# Patient Record
Sex: Female | Born: 1955 | Race: Black or African American | Hispanic: No | Marital: Married | Smoking: Former smoker
Health system: Southern US, Community
[De-identification: ages and names within clinical notes are randomized; demographics above are authoritative.]

## PROBLEM LIST (undated history)

## (undated) DIAGNOSIS — M2550 Pain in unspecified joint: Secondary | ICD-10-CM

## (undated) DIAGNOSIS — Z9889 Other specified postprocedural states: Secondary | ICD-10-CM

## (undated) DIAGNOSIS — R7303 Prediabetes: Secondary | ICD-10-CM

## (undated) DIAGNOSIS — R110 Nausea: Secondary | ICD-10-CM

## (undated) DIAGNOSIS — E785 Hyperlipidemia, unspecified: Secondary | ICD-10-CM

## (undated) DIAGNOSIS — I10 Essential (primary) hypertension: Secondary | ICD-10-CM

## (undated) DIAGNOSIS — E538 Deficiency of other specified B group vitamins: Secondary | ICD-10-CM

## (undated) DIAGNOSIS — K5900 Constipation, unspecified: Secondary | ICD-10-CM

## (undated) DIAGNOSIS — C801 Malignant (primary) neoplasm, unspecified: Secondary | ICD-10-CM

## (undated) DIAGNOSIS — R011 Cardiac murmur, unspecified: Secondary | ICD-10-CM

## (undated) DIAGNOSIS — F419 Anxiety disorder, unspecified: Secondary | ICD-10-CM

## (undated) DIAGNOSIS — E559 Vitamin D deficiency, unspecified: Secondary | ICD-10-CM

## (undated) DIAGNOSIS — R609 Edema, unspecified: Secondary | ICD-10-CM

## (undated) HISTORY — DX: Edema, unspecified: R60.9

## (undated) HISTORY — DX: Hyperlipidemia, unspecified: E78.5

## (undated) HISTORY — DX: Nausea: R11.0

## (undated) HISTORY — PX: POLYPECTOMY: SHX149

## (undated) HISTORY — DX: Vitamin D deficiency, unspecified: E55.9

## (undated) HISTORY — DX: Pain in unspecified joint: M25.50

## (undated) HISTORY — DX: Constipation, unspecified: K59.00

## (undated) HISTORY — DX: Cardiac murmur, unspecified: R01.1

## (undated) HISTORY — PX: APPENDECTOMY: SHX54

## (undated) HISTORY — DX: Prediabetes: R73.03

## (undated) HISTORY — DX: Deficiency of other specified B group vitamins: E53.8

## (undated) HISTORY — DX: Anxiety disorder, unspecified: F41.9

## (undated) HISTORY — DX: Other specified postprocedural states: Z98.890

## (undated) HISTORY — PX: ABDOMINAL HYSTERECTOMY: SHX81

## (undated) HISTORY — DX: Essential (primary) hypertension: I10

---

## 2005-11-26 ENCOUNTER — Ambulatory Visit: Payer: Self-pay | Admitting: Family Medicine

## 2005-11-26 ENCOUNTER — Encounter: Payer: Self-pay | Admitting: Family Medicine

## 2005-11-26 ENCOUNTER — Other Ambulatory Visit: Admission: RE | Admit: 2005-11-26 | Discharge: 2005-11-26 | Payer: Self-pay | Admitting: Family Medicine

## 2005-11-26 LAB — CONVERTED CEMR LAB
ALT: 22 units/L (ref 0–40)
AST: 20 units/L (ref 0–37)
Albumin: 3.7 g/dL (ref 3.5–5.2)
Alkaline Phosphatase: 79 units/L (ref 39–117)
BUN: 8 mg/dL (ref 6–23)
Basophils Absolute: 0 10*3/uL (ref 0.0–0.1)
Chloride: 102 meq/L (ref 96–112)
Cholesterol: 153 mg/dL (ref 0–200)
Creatinine, Ser: 0.8 mg/dL (ref 0.5–1.7)
Eosinophils Absolute: 0.1 10*3/uL (ref 0.0–0.6)
Eosinophils Relative: 1.7 % (ref 0.0–5.0)
Glucose, Bld: 88 mg/dL (ref 70–99)
HCT: 38.3 % (ref 36.0–46.0)
HDL: 35.4 mg/dL — ABNORMAL LOW (ref >39.0)
LDL Cholesterol: 105 mg/dL — ABNORMAL HIGH (ref 0–99)
MCHC: 33.8 g/dL (ref 30.0–36.0)
Neutro Abs: 2.8 10*3/uL (ref 1.4–7.7)
Potassium: 4.5 meq/L (ref 3.5–5.5)
RBC: 4.4 M/uL (ref 3.87–5.11)
Sodium: 137 meq/L (ref 135–145)
TSH: 1.05 microintl units/mL (ref 0.35–5.50)
Total Bilirubin: 0.5 mg/dL (ref 0.3–1.2)
Total CHOL/HDL Ratio: 4.3
Total Protein: 7.7 g/dL (ref 6.0–8.3)

## 2005-12-11 ENCOUNTER — Encounter: Admission: RE | Admit: 2005-12-11 | Discharge: 2005-12-11 | Payer: Self-pay | Admitting: Family Medicine

## 2005-12-17 ENCOUNTER — Ambulatory Visit: Payer: Self-pay | Admitting: Family Medicine

## 2006-01-02 ENCOUNTER — Encounter: Admission: RE | Admit: 2006-01-02 | Discharge: 2006-01-02 | Payer: Self-pay | Admitting: Family Medicine

## 2007-10-12 ENCOUNTER — Encounter: Admission: RE | Admit: 2007-10-12 | Discharge: 2007-10-12 | Payer: Self-pay | Admitting: Family Medicine

## 2007-10-25 ENCOUNTER — Encounter (INDEPENDENT_AMBULATORY_CARE_PROVIDER_SITE_OTHER): Payer: Self-pay | Admitting: *Deleted

## 2008-01-04 ENCOUNTER — Telehealth (INDEPENDENT_AMBULATORY_CARE_PROVIDER_SITE_OTHER): Payer: Self-pay | Admitting: *Deleted

## 2008-01-04 ENCOUNTER — Ambulatory Visit: Payer: Self-pay | Admitting: Family Medicine

## 2008-01-04 DIAGNOSIS — R011 Cardiac murmur, unspecified: Secondary | ICD-10-CM

## 2008-01-04 DIAGNOSIS — F411 Generalized anxiety disorder: Secondary | ICD-10-CM | POA: Insufficient documentation

## 2008-01-04 DIAGNOSIS — I10 Essential (primary) hypertension: Secondary | ICD-10-CM

## 2008-01-06 ENCOUNTER — Ambulatory Visit: Payer: Self-pay | Admitting: Family Medicine

## 2008-01-07 ENCOUNTER — Encounter (INDEPENDENT_AMBULATORY_CARE_PROVIDER_SITE_OTHER): Payer: Self-pay | Admitting: *Deleted

## 2008-01-14 ENCOUNTER — Ambulatory Visit: Payer: Self-pay | Admitting: Family Medicine

## 2008-01-17 ENCOUNTER — Encounter: Payer: Self-pay | Admitting: Family Medicine

## 2008-01-17 ENCOUNTER — Ambulatory Visit: Payer: Self-pay

## 2008-01-17 ENCOUNTER — Telehealth (INDEPENDENT_AMBULATORY_CARE_PROVIDER_SITE_OTHER): Payer: Self-pay | Admitting: *Deleted

## 2008-01-17 LAB — CONVERTED CEMR LAB
AST: 21 units/L (ref 0–37)
Alkaline Phosphatase: 71 units/L (ref 39–117)
Basophils Absolute: 0 10*3/uL (ref 0.0–0.1)
Basophils Relative: 0.8 % (ref 0.0–1.0)
Calcium: 9.5 mg/dL (ref 8.4–10.5)
Chloride: 101 meq/L (ref 96–112)
GFR calc non Af Amer: 94 mL/min
Glucose, Bld: 82 mg/dL (ref 70–99)
HDL: 36.6 mg/dL — ABNORMAL LOW (ref >39.0)
Hemoglobin: 13.5 g/dL (ref 12.0–15.0)
MCV: 90 fL (ref 78.0–100.0)
Neutro Abs: 2.8 10*3/uL (ref 1.4–7.7)
Potassium: 4.5 meq/L (ref 3.5–5.1)
RBC: 4.54 M/uL (ref 3.87–5.11)
RDW: 13.1 % (ref 11.5–14.6)
Sodium: 136 meq/L (ref 135–145)
TSH: 0.99 microintl units/mL (ref 0.35–5.50)
Total Protein: 8 g/dL (ref 6.0–8.3)
VLDL: 12 mg/dL (ref 0–40)
WBC: 4.3 10*3/uL — ABNORMAL LOW (ref 4.5–10.5)

## 2008-01-19 ENCOUNTER — Telehealth (INDEPENDENT_AMBULATORY_CARE_PROVIDER_SITE_OTHER): Payer: Self-pay | Admitting: *Deleted

## 2008-01-24 ENCOUNTER — Telehealth (INDEPENDENT_AMBULATORY_CARE_PROVIDER_SITE_OTHER): Payer: Self-pay | Admitting: *Deleted

## 2008-02-02 ENCOUNTER — Ambulatory Visit: Payer: Self-pay | Admitting: Family Medicine

## 2008-02-21 ENCOUNTER — Ambulatory Visit: Payer: Self-pay | Admitting: Family Medicine

## 2008-02-21 LAB — CONVERTED CEMR LAB
Calcium: 9.8 mg/dL (ref 8.4–10.5)
GFR calc Af Amer: 85 mL/min
Potassium: 4.2 meq/L (ref 3.5–5.1)

## 2008-02-22 ENCOUNTER — Encounter (INDEPENDENT_AMBULATORY_CARE_PROVIDER_SITE_OTHER): Payer: Self-pay | Admitting: *Deleted

## 2008-08-31 ENCOUNTER — Telehealth (INDEPENDENT_AMBULATORY_CARE_PROVIDER_SITE_OTHER): Payer: Self-pay | Admitting: *Deleted

## 2009-03-20 ENCOUNTER — Encounter (INDEPENDENT_AMBULATORY_CARE_PROVIDER_SITE_OTHER): Payer: Self-pay | Admitting: *Deleted

## 2009-04-16 ENCOUNTER — Ambulatory Visit: Payer: Self-pay | Admitting: Family Medicine

## 2009-04-16 DIAGNOSIS — J301 Allergic rhinitis due to pollen: Secondary | ICD-10-CM

## 2009-04-16 DIAGNOSIS — J302 Other seasonal allergic rhinitis: Secondary | ICD-10-CM | POA: Insufficient documentation

## 2009-04-16 LAB — CONVERTED CEMR LAB
ALT: 17 units/L (ref 0–35)
AST: 20 units/L (ref 0–37)
Alkaline Phosphatase: 65 units/L (ref 39–117)
BUN: 8 mg/dL (ref 6–23)
Bilirubin, Direct: 0 mg/dL (ref 0.0–0.3)
CO2: 27 meq/L (ref 19–32)
Calcium: 8.9 mg/dL (ref 8.4–10.5)
Chloride: 103 meq/L (ref 96–112)
Creatinine, Ser: 0.6 mg/dL (ref 0.4–1.2)
GFR calc non Af Amer: 134.84 mL/min (ref >60)
Glucose, Bld: 80 mg/dL (ref 70–99)
HDL: 33.8 mg/dL — ABNORMAL LOW (ref >39.00)
LDL Cholesterol: 99 mg/dL (ref 0–99)
Sodium: 134 meq/L — ABNORMAL LOW (ref 135–145)
Total Bilirubin: 0.9 mg/dL (ref 0.3–1.2)
Total CHOL/HDL Ratio: 4
Total Protein: 7.8 g/dL (ref 6.0–8.3)

## 2009-04-17 ENCOUNTER — Encounter (INDEPENDENT_AMBULATORY_CARE_PROVIDER_SITE_OTHER): Payer: Self-pay | Admitting: *Deleted

## 2009-05-28 ENCOUNTER — Telehealth (INDEPENDENT_AMBULATORY_CARE_PROVIDER_SITE_OTHER): Payer: Self-pay | Admitting: *Deleted

## 2010-05-24 ENCOUNTER — Telehealth (INDEPENDENT_AMBULATORY_CARE_PROVIDER_SITE_OTHER): Payer: Self-pay | Admitting: *Deleted

## 2010-06-17 ENCOUNTER — Encounter (INDEPENDENT_AMBULATORY_CARE_PROVIDER_SITE_OTHER): Payer: Self-pay | Admitting: *Deleted

## 2010-07-26 ENCOUNTER — Ambulatory Visit: Payer: Self-pay | Admitting: Family Medicine

## 2010-07-26 DIAGNOSIS — E785 Hyperlipidemia, unspecified: Secondary | ICD-10-CM | POA: Insufficient documentation

## 2010-08-26 ENCOUNTER — Ambulatory Visit: Payer: Self-pay | Admitting: Family Medicine

## 2010-08-26 DIAGNOSIS — J018 Other acute sinusitis: Secondary | ICD-10-CM | POA: Insufficient documentation

## 2010-11-08 ENCOUNTER — Ambulatory Visit
Admission: RE | Admit: 2010-11-08 | Discharge: 2010-11-08 | Payer: Self-pay | Source: Home / Self Care | Attending: Family Medicine | Admitting: Family Medicine

## 2010-11-13 ENCOUNTER — Ambulatory Visit
Admission: RE | Admit: 2010-11-13 | Discharge: 2010-11-13 | Payer: Self-pay | Source: Home / Self Care | Attending: Family Medicine | Admitting: Family Medicine

## 2010-11-13 ENCOUNTER — Other Ambulatory Visit: Payer: Self-pay | Admitting: Family Medicine

## 2010-11-13 LAB — BASIC METABOLIC PANEL
BUN: 12 mg/dL (ref 6–23)
CO2: 29 mEq/L (ref 19–32)
Calcium: 10.1 mg/dL (ref 8.4–10.5)
Chloride: 101 mEq/L (ref 96–112)
Creatinine, Ser: 0.7 mg/dL (ref 0.4–1.2)
GFR: 112.19 mL/min (ref >60.00)
Glucose, Bld: 87 mg/dL (ref 70–99)
Potassium: 5.1 mEq/L (ref 3.5–5.1)
Sodium: 139 mEq/L (ref 135–145)

## 2010-11-13 LAB — LIPID PANEL
Cholesterol: 218 mg/dL — ABNORMAL HIGH (ref 0–200)
HDL: 43 mg/dL (ref >39.00)
Total CHOL/HDL Ratio: 5
Triglycerides: 54 mg/dL (ref 0.0–149.0)
VLDL: 10.8 mg/dL (ref 0.0–40.0)

## 2010-11-13 LAB — HEPATIC FUNCTION PANEL
ALT: 27 U/L (ref 0–35)
AST: 26 U/L (ref 0–37)
Albumin: 4.3 g/dL (ref 3.5–5.2)
Alkaline Phosphatase: 78 U/L (ref 39–117)
Bilirubin, Direct: 0 mg/dL (ref 0.0–0.3)
Total Bilirubin: 0.7 mg/dL (ref 0.3–1.2)
Total Protein: 8.1 g/dL (ref 6.0–8.3)

## 2010-11-13 LAB — LDL CHOLESTEROL, DIRECT: Direct LDL: 173.9 mg/dL

## 2010-11-24 ENCOUNTER — Encounter: Payer: Self-pay | Admitting: Family Medicine

## 2010-12-03 NOTE — Letter (Signed)
Summary: Primary Care Appointment Letter  [facility] [place] [place]  [address] [place], [place] [address]   Phone: [phone]  Fax: [phone]    06/17/2010 MRN: [id number]  [facility] [address] [place], [place]  [address]  Dear Ms. Poythress,   Your Primary Care Physician Loreen Freud DO has indicated that:    __x_____it is time to schedule an appointment.    _______you missed your appointment on______ and need to call and          reschedule.    _______you need to have lab work done.    _______you need to schedule an appointment discuss lab or test results.    _______you need to call to reschedule your appointment that is                       scheduled on _________.     Please call our office as soon as possible. Our phone number is [phone]. Please press option 1. Our office is open 8a-5p, Monday through Friday.     Thank you,     Primary Care Scheduler

## 2010-12-03 NOTE — Assessment & Plan Note (Signed)
Summary: NEEDS MED REFILLS--LAST SEEN 04/2009///[facility]   Vital Signs:  Patient profile:   55 year old female Height:      66 inches Weight:      189.6 pounds BMI:     30.71 Temp:     98.7 degrees F oral Pulse rate:   72 / minute Pulse rhythm:   regular BP sitting:   140 / 68  (left arm)  Vitals Entered By: Almeta Monas CMA Duncan Dull) (July 26, 2010 1:54 PM) CC: F/U on meds   History of Present Illness:  Hypertension follow-up      This is a 55 year old woman who presents for Hypertension follow-up.  The patient denies lightheadedness, urinary frequency, headaches, edema, impotence, rash, and fatigue.  The patient denies the following associated symptoms: chest pain, chest pressure, exercise intolerance, dyspnea, palpitations, syncope, leg edema, and pedal edema.  Compliance with medications (by patient report) has been near 100%.  The patient reports that dietary compliance has been good.  The patient reports exercising occasionally.  Adjunctive measures currently used by the patient include salt restriction.    Hyperlipidemia follow-up      The patient also presents for Hyperlipidemia follow-up.  The patient denies muscle aches, GI upset, abdominal pain, flushing, itching, constipation, diarrhea, and fatigue.  The patient denies the following symptoms: chest pain/pressure, exercise intolerance, dypsnea, palpitations, syncope, and pedal edema.  Compliance with medications (by patient report) has been near 100%.  Dietary compliance has been good.  The patient reports exercising occasionally.  Adjunctive measures currently used by the patient include ASA.    Current Medications (verified): 1)  Abilify 10 Mg  Tabs (Aripiprazole) .... Take One Tablet 2)  Ativan 1 Mg  Tabs (Lorazepam) .... Take One Tablet Daily 3)  Norvasc 10 Mg  Tabs (Amlodipine Besylate) .[name].. 1 By Mouth Once Daily-Office Visit Due 4)  Hydrochlorothiazide 25 Mg  Tabs (Hydrochlorothiazide) .[name].. 1 By Mouth Once Daily-Office  Visit Due 5)  Zithromax Z-Pak 250 Mg Tabs (Azithromycin) .... 2 By Mouth Once Daily For 1 Day Then 1 By Mouth Once Daily For 4 More Days 6)  Allegra 180 Mg Tabs (Fexofenadine Hcl) .[name].. 1 By Mouth Once Daily As Needed 7)  Flonase 50 Mcg/act Susp (Fluticasone Propionate) .... 2 Sprays Each Nostril Once Daily 8)  Fish Oil 1000 Mg Caps (Omega-3 Fatty Acids) .... 2 By Mouth Two Times A Day 9)  Aspir-Low 81 Mg Tbec (Aspirin) .[name].. 1 By Mouth Once Daily 10)  Vita D  Allergies (verified): 1)  ! Pcn  Past History:  Past medical, surgical, family and social histories (including risk factors) reviewed for relevance to current acute and chronic problems.  Past Medical History: Reviewed history from 01/14/2008 and no changes required. Anxiety Hypertension  Past Surgical History: Reviewed history from 01/04/2008 and no changes required. Hysterectomy  Family History: Reviewed history from 01/04/2008 and no changes required. Family History of Prostate CA 1st degree relative <50 Family History of Arthritis  Social History: Reviewed history from 01/04/2008 and no changes required. Married Current Smoker Alcohol use-no Drug use-no Occupation:  [employer] Occupation:  employed  Review of Systems      See HPI  Physical Exam  General:  Well-developed,well-nourished,in no acute distress; alert,appropriate and cooperative throughout examination Lungs:  Normal respiratory effort, chest expands symmetrically. Lungs are clear to auscultation, no crackles or wheezes. Heart:  normal rate and no murmur.   Extremities:  No clubbing, cyanosis, edema, or deformity noted with normal full range  of motion of all joints.     Impression & Recommendations:  Problem # 1:  HYPERTENSION (ICD-401.9)  Her updated medication list for this problem includes:    Norvasc 10 Mg Tabs (Amlodipine besylate) .[name]... 1 by mouth once daily-office visit due    Hydrochlorothiazide 25 Mg Tabs (Hydrochlorothiazide) .[name]... 1  by mouth once daily-office visit due  BP today: 140/68 Prior BP: 140/90 (04/16/2009)  Labs Reviewed: K+: 4.0 (04/16/2009) Creat: : 0.6 (04/16/2009)   Chol: 151 (04/16/2009)   HDL: 33.80 (04/16/2009)   LDL: 99 (04/16/2009)   TG: 90.0 (04/16/2009)  Problem # 2:  HYPERLIPIDEMIA (ICD-272.4)  Labs Reviewed: SGOT: 20 (04/16/2009)   SGPT: 17 (04/16/2009)   HDL:33.80 (04/16/2009), 36.6 (01/06/2008)  LDL:99 (04/16/2009), 106 (16/08/9603)  Chol:151 (04/16/2009), 154 (01/06/2008)  Trig:90.0 (04/16/2009), 59 (01/06/2008)  Complete Medication List: 1)  Abilify 10 Mg Tabs (Aripiprazole) .... Take one tablet 2)  Ativan 1 Mg Tabs (Lorazepam) .... Take one tablet daily 3)  Norvasc 10 Mg Tabs (Amlodipine besylate) .[name].. 1 by mouth once daily-office visit due 4)  Hydrochlorothiazide 25 Mg Tabs (Hydrochlorothiazide) .[name].. 1 by mouth once daily-office visit due 5)  Zithromax Z-pak 250 Mg Tabs (Azithromycin) .... 2 by mouth once daily for 1 day then 1 by mouth once daily for 4 more days 6)  Allegra 180 Mg Tabs (Fexofenadine hcl) .[name].. 1 by mouth once daily as needed 7)  Flonase 50 Mcg/act Susp (Fluticasone propionate) .... 2 sprays each nostril once daily 8)  Fish Oil 1000 Mg Caps (Omega-3 fatty acids) .... 2 by mouth two times a day 9)  Aspir-low 81 Mg Tbec (Aspirin) .[name].. 1 by mouth once daily 10)  Vita D   Patient Instructions: 1)  272.4  401.9  lipid, hep, bmp-- fasting labs 2)  rto physical in spring Prescriptions: FLONASE 50 MCG/ACT SUSP (FLUTICASONE PROPIONATE) 2 sprays each nostril once daily  #3 x 3   Entered and Authorized by:   Loreen Freud DO   Signed by:   Loreen Freud DO on 07/26/2010   Method used:   Electronically to        [facility] (retail)       [address]       [place], [place]  [address]       Ph: [phone]       Fax: [phone]   RxID:   [id number] ALLEGRA 180 MG TABS (FEXOFENADINE HCL) 1 by mouth once daily as needed  #90 x 3    Entered and Authorized by:   Loreen Freud DO   Signed by:   Loreen Freud DO on 07/26/2010   Method used:   Electronically to        [facility] (retail)       [address]       [place], [place]  [address]       Ph: [phone]       Fax: [phone]   RxID:   [id number] HYDROCHLOROTHIAZIDE 25 MG  TABS (HYDROCHLOROTHIAZIDE) 1 by mouth once daily-OFFICE VISIT DUE  #90 x 3   Entered and Authorized by:   Loreen Freud DO   Signed by:   Loreen Freud DO on 07/26/2010   Method used:   Electronically to        [facility] (retail)       [address]  [place], [place]  [address]       Ph: [phone]       Fax: [phone]   RxID:   [id number] NORVASC 10 MG  TABS (AMLODIPINE BESYLATE) 1 by mouth once daily-OFFICE VISIT DUE  #90 x 3   Entered and Authorized by:   Loreen Freud DO   Signed by:   Loreen Freud DO on 07/26/2010   Method used:   Electronically to        [facility] (retail)       [address]       [place], [place]  [address]       Ph: [phone]       Fax: [phone]   RxID:   [id number]

## 2010-12-03 NOTE — Progress Notes (Signed)
Summary: CHEAPER MED  Phone Note Call from Patient Call back at Home Phone [phone]   Caller: Patient Summary of Call: PT STATES THAT INSURANCE WILL NOT COVE LOVAZA AND WOULD LIKE TO KNOW IF DR LOWNE COULD RX A CHEAPER MED. PLS ADVISE................Marland KitchenFelecia Deloach CMA  May 24, 2010 4:29 PM   Follow-up for Phone Call        she can try otc fish oil 1000mg  2 by mouth two times a day or flaxseed oil Follow-up by: Loreen Freud DO,  May 24, 2010 4:36 PM  Additional Follow-up for Phone Call Additional follow up Details #1::        tried to call pt no answer, will try again on monday...Marland KitchenMarland KitchenFelecia Deloach CMA  May 24, 2010 4:59 PM     Additional Follow-up for Phone Call Additional follow up Details #2::    pt aware................Marland KitchenFelecia Deloach CMA  May 24, 2010 5:03 PM

## 2010-12-03 NOTE — Letter (Signed)
Summary: Work Dietitian at [employer]  [address] [place], [place] [address]   Phone: [phone]  Fax: [phone]    Today's Date: July 26, 2010  Name of Patient: Alicia Knapp  The above named patient had a medical visit today at:  215p pm.  Please take this into consideration when reviewing the time away from work/school.    Special Instructions:  [ X ] None  [  ] To be off the remainder of today, returning to the normal work / school schedule tomorrow.  [  ] To be off until the next scheduled appointment on ______________________.  [  ] Other ________________________________________________________________ ________________________________________________________________________   Sincerely yours,   Loreen Freud DO

## 2010-12-03 NOTE — Assessment & Plan Note (Signed)
Summary: SINUS//PH   Vital Signs:  Patient profile:   55 year old female Weight:      189.4 pounds Temp:     98.1 degrees F oral Pulse rate:   72 / minute Pulse rhythm:   regular BP sitting:   130 / 70  (right arm) Cuff size:   regular  Vitals Entered By: Almeta Monas CMA Duncan Dull) (August 26, 2010 11:33 AM) CC: c/o ears clogged, lightheadedness, nasal congestion, sinus pressure and post nasal  drip, URI symptoms   History of Present Illness:       This is a 55 year old woman who presents with URI symptoms.  The symptoms began 4 days ago.  The patient complains of nasal congestion, purulent nasal discharge, dry cough, earache, and sick contacts.  The patient denies fever, low-grade fever (<100.5 degrees), fever of 100.5-103 degrees, fever of 103.1-104 degrees, fever to >104 degrees, stiff neck, dyspnea, wheezing, rash, vomiting, diarrhea, use of an antipyretic, and response to antipyretic.  The patient denies itchy watery eyes, itchy throat, sneezing, seasonal symptoms, response to antihistamine, headache, muscle aches, and severe fatigue.  The patient denies the following risk factors for Strep sinusitis: unilateral facial pain, unilateral nasal discharge, poor response to decongestant, double sickening, tooth pain, Strep exposure, tender adenopathy, and absence of cough.  + b/l sinus pressure  Current Medications (verified): 1)  Abilify 10 Mg  Tabs (Aripiprazole) .... Take One Tablet 2)  Ativan 1 Mg  Tabs (Lorazepam) .... Take One Tablet Daily 3)  Norvasc 10 Mg  Tabs (Amlodipine Besylate) .[name].. 1 By Mouth Once Daily-Office Visit Due 4)  Hydrochlorothiazide 25 Mg  Tabs (Hydrochlorothiazide) .[name].. 1 By Mouth Once Daily-Office Visit Due 5)  Zithromax Z-Pak 250 Mg Tabs (Azithromycin) .... 2 By Mouth Once Daily For 1 Day Then 1 By Mouth Once Daily For 4 More Days 6)  Allegra 180 Mg Tabs (Fexofenadine Hcl) .[name].. 1 By Mouth Once Daily As Needed 7)  Flonase 50 Mcg/act Susp (Fluticasone Propionate)  .... 2 Sprays Each Nostril Once Daily 8)  Fish Oil 1000 Mg Caps (Omega-3 Fatty Acids) .... 2 By Mouth Two Times A Day 9)  Aspir-Low 81 Mg Tbec (Aspirin) .[name].. 1 By Mouth Once Daily 10)  Vita D  Allergies (verified): 1)  ! Pcn 2)  ! Erythromycin  Physical Exam  General:  Well-developed,well-nourished,in no acute distress; alert,appropriate and cooperative throughout examination Ears:  + b/l cerumen impaction---- irrigated TM  + fluid  Nose:  L frontal sinus tenderness and R frontal sinus tenderness.   Mouth:  Oral mucosa and oropharynx without lesions or exudates.  Teeth in good repair. Neck:  No deformities, masses, or tenderness noted. Lungs:  Normal respiratory effort, chest expands symmetrically. Lungs are clear to auscultation, no crackles or wheezes. Psych:  Oriented X3 and good eye contact.     Impression & Recommendations:  Problem # 1:  OTHER ACUTE SINUSITIS (ICD-461.8)  The following medications were removed from the medication list:    Zithromax Z-pak 250 Mg Tabs (Azithromycin) .[name]... 2 by mouth once daily for 1 day then 1 by mouth once daily for 4 more days Her updated medication list for this problem includes:    Flonase 50 Mcg/act Susp (Fluticasone propionate) .[name]... 2 sprays each nostril once daily    Levaquin 500 Mg Tabs (Levofloxacin) .[name]... 1 by mouth once daily  Instructed on treatment. Call if symptoms persist or worsen.   Complete Medication List: 1)  Abilify 10 Mg Tabs (Aripiprazole) .Marland KitchenMarland KitchenMarland Kitchen  Take one tablet 2)  Ativan 1 Mg Tabs (Lorazepam) .... Take one tablet daily 3)  Norvasc 10 Mg Tabs (Amlodipine besylate) .[name].. 1 by mouth once daily-office visit due 4)  Hydrochlorothiazide 25 Mg Tabs (Hydrochlorothiazide) .[name].. 1 by mouth once daily-office visit due 5)  Allegra 180 Mg Tabs (Fexofenadine hcl) .[name].. 1 by mouth once daily as needed 6)  Flonase 50 Mcg/act Susp (Fluticasone propionate) .... 2 sprays each nostril once daily 7)  Fish Oil 1000 Mg Caps (Omega-3 fatty  acids) .... 2 by mouth two times a day 8)  Aspir-low 81 Mg Tbec (Aspirin) .[name].. 1 by mouth once daily 9)  Vita D  10)  Levaquin 500 Mg Tabs (Levofloxacin) .[name].. 1 by mouth once daily  Patient Instructions: 1)  Acute sinusitis symptoms for less than 10 days are not helped by antibiotics. Use warm moist compresses, and over the counter decongestants( only as directed). Call if no improvement in 5-7 days, sooner if increasing pain, fever, or new symptoms.  Prescriptions: LEVAQUIN 500 MG TABS (LEVOFLOXACIN) 1 by mouth once daily  #10 x 0   Entered and Authorized by:   Loreen Freud DO   Signed by:   Loreen Freud DO on 08/26/2010   Method used:   Electronically to        [facility] (retail)       [address]       [place], [place]  [address]       Ph: [phone]       Fax: [phone]   RxID:   [id number] BIAXIN XL PAC 500 MG XR24H-TAB (CLARITHROMYCIN) 2 by mouth once daily  #28 x 0   Entered and Authorized by:   Loreen Freud DO   Signed by:   Loreen Freud DO on 08/26/2010   Method used:   Electronically to        [facility] (retail)       [address]       [place], [place]  [address]       Ph: [phone]       Fax: [phone]   RxID:   [id number] ALLEGRA 180 MG TABS (FEXOFENADINE HCL) 1 by mouth once daily as needed  #90 x 3   Entered and Authorized by:   Loreen Freud DO   Signed by:   Loreen Freud DO on 08/26/2010   Method used:   Electronically to        [facility] (retail)       [address]       [place], [place]  [address]       Ph: [phone]       Fax: [phone]   RxID:   [id number] HYDROCHLOROTHIAZIDE 25 MG  TABS (HYDROCHLOROTHIAZIDE) 1 by mouth once daily-OFFICE VISIT DUE  #90 x 3   Entered and Authorized by:   Loreen Freud DO   Signed by:   Loreen Freud DO on 08/26/2010   Method used:   Electronically to        [facility] (retail)        [address]       [place], [place]  [address]       Ph: [phone]       Fax:  [phone]   RxID:   [id number]    Orders Added: 1)  Est. Patient Level III [[address]]

## 2010-12-03 NOTE — Letter (Signed)
Summary: Out of Work  [employer] [facility] [employer]  [address] [place], [place] [address]   Phone: [phone]  Fax: [phone]    August 26, 2010   Employee:  Alicia Knapp    To Whom It May Concern:   For Medical reasons, please excuse the above named employee from work for the following dates:  Start:   August 26, 2010  End:   August 27, 2010  If you need additional information, please feel free to contact our office.         Sincerely,    Loreen Freud DO

## 2010-12-05 NOTE — Assessment & Plan Note (Signed)
Summary: FOLLOWUP/KN   Vital Signs:  Patient profile:   55 year old female Weight:      194.4 pounds Temp:     98.0 degrees F oral BP sitting:   120 / 70  (right arm) Cuff size:   large  Vitals Entered By: Almeta Monas CMA Duncan Dull) (November 08, 2010 2:01 PM) CC: F/U ON MEDS-- Due for recheck 3 months   272.4  hep, lipid     History of Present Illness:  Hyperlipidemia follow-up      This is a 55 year old woman who presents for Hyperlipidemia follow-up.  The patient denies muscle aches, GI upset, abdominal pain, flushing, itching, constipation, diarrhea, and fatigue.  The patient denies the following symptoms: chest pain/pressure, exercise intolerance, dypsnea, palpitations, syncope, and pedal edema.  Compliance with medications (by patient report) has been near 100%.  Dietary compliance has been good.  The patient reports exercising occasionally.  Adjunctive measures currently used by the patient include ASA and weight reduction.    Hypertension follow-up      The patient also presents for Hypertension follow-up.  The patient denies lightheadedness, urinary frequency, headaches, edema, impotence, rash, and fatigue.  The patient denies the following associated symptoms: chest pain, chest pressure, exercise intolerance, dyspnea, palpitations, syncope, leg edema, and pedal edema.  Compliance with medications (by patient report) has been near 100%.  The patient reports that dietary compliance has been good.  The patient reports exercising occasionally.  Adjunctive measures currently used by the patient include salt restriction.    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 Take One Tablet 2)  Ativan 1 Mg  Tabs (Lorazepam) ....  Take One Tablet Daily 3)  Norvasc 10 Mg  Tabs (Amlodipine Besylate) .[name].. 1 By Mouth Once Daily-Office Visit Due 4)  Hydrochlorothiazide 25 Mg  Tabs (Hydrochlorothiazide) .[name].. 1 By Mouth Once Daily-Office Visit Due 5)  Allegra 180 Mg Tabs (Fexofenadine Hcl) .[name].. 1 By Mouth Once Daily As Needed 6)  Flonase 50 Mcg/act Susp (Fluticasone Propionate) .... 2 Sprays Each Nostril Once Daily 7)  Fish Oil 1000 Mg Caps (Omega-3 Fatty Acids) .... 2 By Mouth Two Times A Day 8)  Aspir-Low 81 Mg Tbec (Aspirin) .[name].. 1 By Mouth Once Daily 9)  Vita D  Current Medications (verified): 1)  Abilify 10 Mg  Tabs (Aripiprazole) .... Take One Tablet 2)  Ativan 1 Mg  Tabs (Lorazepam) .... Take One Tablet Daily 3)  Norvasc 10 Mg  Tabs (Amlodipine Besylate) .[name].. 1 By Mouth Once Daily-Office Visit Due 4)  Hydrochlorothiazide 25 Mg  Tabs (Hydrochlorothiazide) .[name].. 1 By Mouth Once Daily-Office Visit Due 5)  Allegra 180 Mg Tabs (Fexofenadine Hcl) .[name].. 1 By Mouth Once Daily As Needed 6)  Flonase 50 Mcg/act Susp (Fluticasone Propionate) .... 2 Sprays Each Nostril Once Daily 7)  Fish Oil 1000 Mg Caps (Omega-3 Fatty Acids) .... 2 By Mouth Two Times A Day 8)  Aspir-Low 81 Mg Tbec (Aspirin) .[name].. 1 By Mouth Once Daily 9)  Vita D 10)  Aspirin 81 Mg Chew (Aspirin) .[name].. 1 By Mouth Once Daily  Allergies (verified): 1)  ! Pcn 2)  ! Erythromycin  Past History:  Past Medical History: Last updated: 01/14/2008 Anxiety  Hypertension  Past Surgical History: Last updated: 01/04/2008 Hysterectomy  Family History: Last updated: 01/04/2008 Family History of Prostate CA 1st degree relative <50 Family History of Arthritis  Social History: Last updated: 07/26/2010 Married Current Smoker Alcohol use-no Drug use-no Occupation:  [employer]  Risk Factors: Smoking Status: current (01/04/2008)  Family History: Reviewed history from 01/04/2008 and no changes required. Family History of Prostate CA 1st degree relative <50 Family  History of Arthritis  Social History: Reviewed history from 07/26/2010 and no changes required. Married Current Smoker Alcohol use-no Drug use-no Occupation:  [employer]  Review of Systems      See HPI  Physical Exam  General:  Well-developed,well-nourished,in no acute distress; alert,appropriate and cooperative throughout examination Lungs:  Normal respiratory effort, chest expands symmetrically. Lungs are clear to auscultation, no crackles or wheezes. Heart:  normal rate and Grade  2 /6 systolic ejection murmur.   Extremities:  No clubbing, cyanosis, edema, or deformity noted with normal full range of motion of all joints.   Psych:  Cognition and judgment appear intact. Alert and cooperative with normal attention span and concentration. No apparent delusions, illusions, hallucinations   Impression & Recommendations:  Problem # 1:  HYPERLIPIDEMIA (ICD-272.4)  Labs Reviewed: SGOT: 20 (04/16/2009)   SGPT: 17 (04/16/2009)   HDL:33.80 (04/16/2009), 36.6 (01/06/2008)  LDL:99 (04/16/2009), 106 (04/54/0981)  Chol:151 (04/16/2009), 154 (01/06/2008)  Trig:90.0 (04/16/2009), 59 (01/06/2008)  Problem # 2:  HYPERTENSION (ICD-401.9)  Her updated medication list for this problem includes:    Norvasc 10 Mg Tabs (Amlodipine besylate) .[name]... 1 by mouth once daily-office visit due    Hydrochlorothiazide 25 Mg Tabs (Hydrochlorothiazide) .[name]... 1 by mouth once daily-office visit due  BP today: 120/70 Prior BP: 130/70 (08/26/2010)  Labs Reviewed: K+: 4.0 (04/16/2009) Creat: : 0.6 (04/16/2009)   Chol: 151 (04/16/2009)   HDL: 33.80 (04/16/2009)   LDL: 99 (04/16/2009)   TG: 90.0 (04/16/2009)  Complete Medication List: 1)  Abilify 10 Mg Tabs (Aripiprazole) .... Take one tablet 2)  Ativan 1 Mg Tabs (Lorazepam) .... Take one tablet daily 3)  Norvasc 10 Mg Tabs (Amlodipine besylate) .[name].. 1 by mouth once daily-office visit due 4)  Hydrochlorothiazide 25 Mg Tabs (Hydrochlorothiazide) .[name].. 1 by mouth  once daily-office visit due 5)  Allegra 180 Mg Tabs (Fexofenadine hcl) .[name].. 1 by mouth once daily as needed 6)  Flonase 50 Mcg/act Susp (Fluticasone propionate) .... 2 sprays each nostril once daily 7)  Fish Oil 1000 Mg Caps (Omega-3 fatty acids) .... 2 by mouth two times a day 8)  Aspir-low 81 Mg Tbec (Aspirin) .[name].. 1 by mouth once daily 9)  Vita D  10)  Aspirin 81 Mg Chew (Aspirin) .[name].. 1 by mouth once daily  Patient Instructions: 1)  fasting labs---272.4  401.9  lipid, hep, bmp 2)  Please schedule a follow-up appointment in 6 months .    Orders Added: 1)  Est. Patient Level III [19147]

## 2011-08-24 ENCOUNTER — Other Ambulatory Visit: Payer: Self-pay | Admitting: Family Medicine

## 2011-10-02 ENCOUNTER — Encounter: Payer: Self-pay | Admitting: Family Medicine

## 2011-10-06 ENCOUNTER — Ambulatory Visit (INDEPENDENT_AMBULATORY_CARE_PROVIDER_SITE_OTHER): Admitting: Family Medicine

## 2011-10-06 ENCOUNTER — Encounter: Payer: Self-pay | Admitting: Family Medicine

## 2011-10-06 VITALS — BP 140/92 | HR 73 | Temp 98.6°F | Wt 190.6 lb

## 2011-10-06 DIAGNOSIS — I10 Essential (primary) hypertension: Secondary | ICD-10-CM

## 2011-10-06 MED ORDER — AMLODIPINE BESYLATE 10 MG PO TABS: ORAL_TABLET | ORAL | Status: DC

## 2011-10-06 MED ORDER — CARVEDILOL 6.25 MG PO TABS: 6.2500 mg | ORAL_TABLET | Freq: Two times a day (BID) | ORAL | Status: DC

## 2011-10-06 MED ORDER — HYDROCHLOROTHIAZIDE 25 MG PO TABS: ORAL_TABLET | ORAL | Status: DC

## 2011-10-06 NOTE — Progress Notes (Signed)
  Subjective:    Patient here for follow-up of elevated blood pressure.  She is exercising and is adherent to a low-salt diet.  Blood pressure is not well controlled at home. Cardiac symptoms: none. Patient denies: chest pain, chest pressure/discomfort, claudication, dyspnea, exertional chest pressure/discomfort, fatigue, irregular heart beat, lower extremity edema, near-syncope, orthopnea, palpitations, paroxysmal nocturnal dyspnea, syncope and tachypnea. Cardiovascular risk factors: hypertension and sedentary lifestyle. Use of agents associated with hypertension: none. History of target organ damage: none.  The following portions of the patient's history were reviewed and updated as appropriate: allergies, current medications, past family history, past medical history, past social history, past surgical history and problem list.  Review of Systems Pertinent items are noted in HPI.     Objective:    BP 140/92  Pulse 73  Temp(Src) 98.6 F (37 C) (Oral)  Wt 190 lb 9.6 oz (86.456 kg)  SpO2 98% General appearance: alert, cooperative, appears stated age and no distress Nose: Nares normal. Septum midline. Mucosa normal. No drainage or sinus tenderness. Neck: no adenopathy, no carotid bruit, no JVD, supple, symmetrical, trachea midline and thyroid not enlarged, symmetric, no tenderness/mass/nodules Lungs: clear to auscultation bilaterally Heart: regular rate and rhythm, S1, S2 normal, no murmur, click, rub or gallop Extremities: extremities normal, atraumatic, no cyanosis or edema    Assessment:    Hypertension, stage 1 . Evidence of target organ damage: none.    Plan:    Medication: continue hctz and norvasc and begin coreg 6.12. Dietary sodium restriction. Regular aerobic exercise. Check blood pressures 2-3 times weekly and record. Follow up: 3 months and as needed.

## 2011-10-06 NOTE — Patient Instructions (Signed)

## 2011-12-29 ENCOUNTER — Other Ambulatory Visit: Payer: Self-pay | Admitting: Family Medicine

## 2012-01-12 ENCOUNTER — Other Ambulatory Visit: Payer: Self-pay | Admitting: Family Medicine

## 2012-04-09 ENCOUNTER — Other Ambulatory Visit: Payer: Self-pay | Admitting: Family Medicine

## 2012-04-19 ENCOUNTER — Other Ambulatory Visit: Payer: Self-pay | Admitting: Family Medicine

## 2012-05-03 ENCOUNTER — Ambulatory Visit (INDEPENDENT_AMBULATORY_CARE_PROVIDER_SITE_OTHER): Admitting: Family Medicine

## 2012-05-03 ENCOUNTER — Encounter: Payer: Self-pay | Admitting: Family Medicine

## 2012-05-03 VITALS — BP 130/72 | HR 73 | Temp 98.3°F | Wt 195.2 lb

## 2012-05-03 DIAGNOSIS — J329 Chronic sinusitis, unspecified: Secondary | ICD-10-CM

## 2012-05-03 DIAGNOSIS — I10 Essential (primary) hypertension: Secondary | ICD-10-CM

## 2012-05-03 DIAGNOSIS — E785 Hyperlipidemia, unspecified: Secondary | ICD-10-CM

## 2012-05-03 DIAGNOSIS — N390 Urinary tract infection, site not specified: Secondary | ICD-10-CM

## 2012-05-03 DIAGNOSIS — J018 Other acute sinusitis: Secondary | ICD-10-CM

## 2012-05-03 LAB — POCT URINALYSIS DIPSTICK
Bilirubin, UA: NEGATIVE
Glucose, UA: NEGATIVE
Nitrite, UA: NEGATIVE
Protein, UA: NEGATIVE
Spec Grav, UA: <=1.005
Urobilinogen, UA: 0.2
pH, UA: 7

## 2012-05-03 LAB — BASIC METABOLIC PANEL
Chloride: 102 mEq/L (ref 96–112)
Sodium: 138 mEq/L (ref 135–145)

## 2012-05-03 LAB — LIPID PANEL
HDL: 46.6 mg/dL (ref >39.00)
Total CHOL/HDL Ratio: 4
VLDL: 21.8 mg/dL (ref 0.0–40.0)

## 2012-05-03 LAB — HEPATIC FUNCTION PANEL
AST: 21 U/L (ref 0–37)
Total Bilirubin: 0.5 mg/dL (ref 0.3–1.2)

## 2012-05-03 MED ORDER — CARVEDILOL 6.25 MG PO TABS: 6.2500 mg | ORAL_TABLET | Freq: Two times a day (BID) | ORAL | Status: DC

## 2012-05-03 MED ORDER — SULFAMETHOXAZOLE-TRIMETHOPRIM 800-160 MG PO TABS: 1.0000 | ORAL_TABLET | Freq: Two times a day (BID) | ORAL | Status: AC

## 2012-05-03 MED ORDER — AMLODIPINE BESYLATE 10 MG PO TABS: ORAL_TABLET | ORAL | Status: DC

## 2012-05-03 NOTE — Progress Notes (Signed)
  Subjective:     Alicia Knapp is a 56 y.o. female who presents for evaluation of sinus pain. Symptoms include: sinus pressure, nasal congestion and ears feel full.. Onset of symptoms was 7 days ago. Symptoms have been gradually worsened since that time. Past history is significant for no bronchitis, pneumonia or asthma.. Patient is a smoker.  Past Medical History  Diagnosis Date  . Anxiety   . Hypertension    History   Social History  . Marital Status: Married    Spouse Name: N/A    Number of Children: N/A  . Years of Education: N/A   Occupational History  . Not on file.   Social History Main Topics  . Smoking status: Current Everyday Smoker  . Smokeless tobacco: Not on file  . Alcohol Use: No  . Drug Use: No  . Sexually Active:    Other Topics Concern  . Not on file   Social History Narrative  . No narrative on file   Current Outpatient Prescriptions on File Prior to Visit  Medication Sig Dispense Refill  . ARIPiprazole (ABILIFY) 10 MG tablet Take 10 mg by mouth daily.        [name] aspirin 81 MG tablet Take 81 mg by mouth daily.        . fexofenadine (ALLEGRA) 180 MG tablet Take 180 mg by mouth daily.        . fluticasone (FLONASE) 50 MCG/ACT nasal spray Place 2 sprays into the nose daily.        . hydrochlorothiazide (HYDRODIURIL) 25 MG tablet 1 po qd  90 tablet  2  . LORazepam (ATIVAN) 1 MG tablet Take 1 mg by mouth every 8 (eight) hours.        . Omega-3 Fatty Acids (FISH OIL) 1000 MG CAPS Take by mouth 2 (two) times daily. Take two       . DISCONTD: amLODipine (NORVASC) 10 MG tablet TAKE ONE TABLET BY MOUTH EVERY DAY  30 tablet  0  . DISCONTD: carvedilol (COREG) 6.25 MG tablet TAKE ONE TABLET BY MOUTH TWICE DAILY  60 tablet  0   Allergies  Allergen Reactions  . Erythromycin   . Penicillins      Review of Systems As above  Objective:     Filed Vitals:   05/03/12 1137  BP: 130/72  Pulse: 73  Temp: 98.3 F (36.8 C)  TempSrc: Oral  Weight: 195 lb 3.2 oz  (88.542 kg)  SpO2: 97%  gen--AAOx3 Heent---tm + b/l cerumen impaction--- irrigated successfully and + fluid b/l       Nose-- turb errythema,  + sinus tenderness Cor--S1S2 no murmur Lung  --ctab/l  No rrw    Assessment:    Acute bacterial sinusitis.    Plan:    see problem list

## 2012-05-03 NOTE — Patient Instructions (Signed)

## 2012-05-03 NOTE — Assessment & Plan Note (Signed)
abx con't flonase rto prn

## 2012-08-19 ENCOUNTER — Other Ambulatory Visit: Payer: Self-pay | Admitting: Family Medicine

## 2012-11-14 ENCOUNTER — Other Ambulatory Visit: Payer: Self-pay | Admitting: Family Medicine

## 2012-11-28 ENCOUNTER — Other Ambulatory Visit: Payer: Self-pay | Admitting: Family Medicine

## 2012-12-14 ENCOUNTER — Other Ambulatory Visit: Payer: Self-pay | Admitting: Family Medicine

## 2012-12-20 ENCOUNTER — Encounter: Payer: Self-pay | Admitting: Lab

## 2012-12-20 ENCOUNTER — Encounter: Payer: Self-pay | Admitting: Family Medicine

## 2012-12-20 ENCOUNTER — Ambulatory Visit (INDEPENDENT_AMBULATORY_CARE_PROVIDER_SITE_OTHER): Admitting: Family Medicine

## 2012-12-20 VITALS — BP 142/86 | HR 65 | Temp 98.2°F | Wt 184.8 lb

## 2012-12-20 DIAGNOSIS — E785 Hyperlipidemia, unspecified: Secondary | ICD-10-CM

## 2012-12-20 DIAGNOSIS — I10 Essential (primary) hypertension: Secondary | ICD-10-CM

## 2012-12-20 LAB — CBC WITH DIFFERENTIAL/PLATELET
Basophils Absolute: 0.1 10*3/uL (ref 0.0–0.1)
Eosinophils Absolute: 0.2 10*3/uL (ref 0.0–0.7)
HCT: 39.8 % (ref 36.0–46.0)
Lymphs Abs: 2.2 10*3/uL (ref 0.7–4.0)
MCV: 89.7 fl (ref 78.0–100.0)
Monocytes Absolute: 0.3 10*3/uL (ref 0.1–1.0)
Neutrophils Relative %: 44.4 % (ref 43.0–77.0)
Platelets: 326 10*3/uL (ref 150.0–400.0)
RDW: 14.2 % (ref 11.5–14.6)

## 2012-12-20 LAB — LIPID PANEL
Cholesterol: 181 mg/dL (ref 0–200)
HDL: 38.1 mg/dL — ABNORMAL LOW (ref >39.00)
LDL Cholesterol: 130 mg/dL — ABNORMAL HIGH (ref 0–99)
Triglycerides: 66 mg/dL (ref 0.0–149.0)
VLDL: 13.2 mg/dL (ref 0.0–40.0)

## 2012-12-20 LAB — BASIC METABOLIC PANEL
CO2: 28 mEq/L (ref 19–32)
Calcium: 9.5 mg/dL (ref 8.4–10.5)
Chloride: 102 mEq/L (ref 96–112)

## 2012-12-20 LAB — HEPATIC FUNCTION PANEL: Total Bilirubin: 0.4 mg/dL (ref 0.3–1.2)

## 2012-12-20 MED ORDER — FEXOFENADINE HCL 180 MG PO TABS: 180.0000 mg | ORAL_TABLET | Freq: Every day | ORAL | Status: DC

## 2012-12-20 MED ORDER — CARVEDILOL 6.25 MG PO TABS: ORAL_TABLET | ORAL | Status: DC

## 2012-12-20 MED ORDER — HYDROCHLOROTHIAZIDE 25 MG PO TABS: ORAL_TABLET | ORAL | Status: DC

## 2012-12-20 MED ORDER — AMLODIPINE BESYLATE 10 MG PO TABS: ORAL_TABLET | ORAL | Status: DC

## 2012-12-20 NOTE — Progress Notes (Signed)
  Subjective:    Patient here for follow-up of elevated blood pressure.  She is not exercising and is adherent to a low-salt diet.  Blood pressure is well controlled at home. Cardiac symptoms: none. Patient denies: chest pain, chest pressure/discomfort, claudication, dyspnea, exertional chest pressure/discomfort, fatigue, irregular heart beat, lower extremity edema, near-syncope, orthopnea, palpitations, paroxysmal nocturnal dyspnea, syncope and tachypnea. Cardiovascular risk factors: hypertension and sedentary lifestyle. Use of agents associated with hypertension: none. History of target organ damage: none.  The following portions of the patient's history were reviewed and updated as appropriate: allergies, current medications, past family history, past medical history, past social history, past surgical history and problem list.  Review of Systems Pertinent items are noted in HPI.     Objective:    BP 142/86  Pulse 65  Temp(Src) 98.2 F (36.8 C) (Oral)  Wt 184 lb 12.8 oz (83.825 kg)  BMI 29.84 kg/m2  SpO2 97% General appearance: alert, cooperative, appears stated age and no distress Lungs: clear to auscultation bilaterally Heart: S1, S2 normal Extremities: extremities normal, atraumatic, no cyanosis or edema    Assessment:    Hypertension, slighlty high today but pt did not take meds today . Evidence of target organ damage: none.    Plan:    Medication: no change. Dietary sodium restriction. Regular aerobic exercise. Follow up: 6 months and as needed.

## 2012-12-20 NOTE — Patient Instructions (Signed)

## 2012-12-20 NOTE — Assessment & Plan Note (Signed)
Check labs 

## 2012-12-21 LAB — POCT URINALYSIS DIPSTICK
Blood, UA: NEGATIVE
Glucose, UA: NEGATIVE
Protein, UA: NEGATIVE
Spec Grav, UA: <=1.005
Urobilinogen, UA: 0.2
pH, UA: 5

## 2012-12-24 ENCOUNTER — Encounter: Payer: Self-pay | Admitting: *Deleted

## 2013-07-06 ENCOUNTER — Other Ambulatory Visit: Payer: Self-pay | Admitting: *Deleted

## 2013-07-06 MED ORDER — CARVEDILOL 6.25 MG PO TABS: ORAL_TABLET | ORAL | Status: DC

## 2013-07-06 NOTE — Telephone Encounter (Signed)
Rx was refilled for carvedilol. Ag cma

## 2013-07-12 ENCOUNTER — Other Ambulatory Visit: Payer: Self-pay | Admitting: *Deleted

## 2013-07-12 MED ORDER — AMLODIPINE BESYLATE 10 MG PO TABS: ORAL_TABLET | ORAL | Status: DC

## 2013-07-12 NOTE — Telephone Encounter (Signed)
Rx was refilled  for amlodipine.  Ag cma

## 2013-08-03 ENCOUNTER — Telehealth: Payer: Self-pay | Admitting: Family Medicine

## 2013-08-03 MED ORDER — CARVEDILOL 6.25 MG PO TABS: ORAL_TABLET | ORAL | Status: DC

## 2013-08-03 NOTE — Telephone Encounter (Signed)
Patient called wanted a refill on her carvedilol (COREG) 6.25 MG tablet. Patient also made an appointment for October 8th to be seen.   Pharmacy [id number] - [place] ([place]), [place] - [address]

## 2013-08-03 NOTE — Telephone Encounter (Signed)
Rx sent      KP 

## 2013-08-10 ENCOUNTER — Ambulatory Visit (INDEPENDENT_AMBULATORY_CARE_PROVIDER_SITE_OTHER): Admitting: Family Medicine

## 2013-08-10 ENCOUNTER — Encounter: Payer: Self-pay | Admitting: Family Medicine

## 2013-08-10 VITALS — BP 132/70 | HR 67 | Temp 98.5°F | Wt 181.4 lb

## 2013-08-10 DIAGNOSIS — E785 Hyperlipidemia, unspecified: Secondary | ICD-10-CM

## 2013-08-10 DIAGNOSIS — J309 Allergic rhinitis, unspecified: Secondary | ICD-10-CM

## 2013-08-10 DIAGNOSIS — I10 Essential (primary) hypertension: Secondary | ICD-10-CM

## 2013-08-10 DIAGNOSIS — J302 Other seasonal allergic rhinitis: Secondary | ICD-10-CM

## 2013-08-10 MED ORDER — CARVEDILOL 6.25 MG PO TABS: ORAL_TABLET | ORAL | Status: DC

## 2013-08-10 MED ORDER — FLUTICASONE PROPIONATE 50 MCG/ACT NA SUSP: 2.0000 | Freq: Every day | NASAL | Status: DC

## 2013-08-10 MED ORDER — HYDROCHLOROTHIAZIDE 25 MG PO TABS: ORAL_TABLET | ORAL | Status: DC

## 2013-08-10 MED ORDER — AMLODIPINE BESYLATE 10 MG PO TABS: ORAL_TABLET | ORAL | Status: DC

## 2013-08-10 NOTE — Progress Notes (Signed)
  Subjective:    Patient here for follow-up of elevated blood pressure.  She is not exercising and is adherent to a low-salt diet.  Blood pressure is well controlled at home. Cardiac symptoms: none. Patient denies: chest pain, chest pressure/discomfort, claudication, dyspnea, exertional chest pressure/discomfort, fatigue, irregular heart beat, lower extremity edema, near-syncope, orthopnea, palpitations, paroxysmal nocturnal dyspnea, syncope and tachypnea. Cardiovascular risk factors: dyslipidemia, hypertension and sedentary lifestyle. Use of agents associated with hypertension: none. History of target organ damage: none.  The following portions of the patient's history were reviewed and updated as appropriate: allergies, current medications, past family history, past medical history, past social history, past surgical history and problem list.  Review of Systems Pertinent items are noted in HPI.     Objective:    BP 132/70  Pulse 67  Temp(Src) 98.5 F (36.9 C) (Oral)  Wt 181 lb 6.4 oz (82.283 kg)  BMI 29.29 kg/m2  SpO2 97% General appearance: alert, cooperative, appears stated age and no distress Head: Normocephalic, without obvious abnormality, atraumatic Eyes: conjunctivae/corneas clear. PERRL, EOM's intact. Fundi benign. Throat: lips, mucosa, and tongue normal; teeth and gums normal Neck: no adenopathy, no carotid bruit, no JVD, supple, symmetrical, trachea midline and thyroid not enlarged, symmetric, no tenderness/mass/nodules Lungs: clear to auscultation bilaterally Heart: S1, S2 normal Extremities: extremities normal, atraumatic, no cyanosis or edema    Assessment:    Hypertension, normal blood pressure . Evidence of target organ damage: none.    Plan:    Medication: no change. Dietary sodium restriction. Regular aerobic exercise. Follow up: 6 months and as needed.

## 2013-08-10 NOTE — Patient Instructions (Signed)

## 2013-08-10 NOTE — Assessment & Plan Note (Signed)
Check labs 

## 2013-08-10 NOTE — Assessment & Plan Note (Signed)
stable °

## 2013-08-12 ENCOUNTER — Other Ambulatory Visit (INDEPENDENT_AMBULATORY_CARE_PROVIDER_SITE_OTHER)

## 2013-08-12 DIAGNOSIS — I10 Essential (primary) hypertension: Secondary | ICD-10-CM

## 2013-08-12 DIAGNOSIS — E785 Hyperlipidemia, unspecified: Secondary | ICD-10-CM

## 2013-08-12 LAB — BASIC METABOLIC PANEL
BUN: 12 mg/dL (ref 6–23)
CO2: 29 mEq/L (ref 19–32)
Calcium: 9.6 mg/dL (ref 8.4–10.5)
Chloride: 102 mEq/L (ref 96–112)
Glucose, Bld: 95 mg/dL (ref 70–99)
Sodium: 141 mEq/L (ref 135–145)

## 2013-08-12 LAB — HEPATIC FUNCTION PANEL
ALT: 23 U/L (ref 0–35)
Albumin: 4.3 g/dL (ref 3.5–5.2)
Alkaline Phosphatase: 65 U/L (ref 39–117)
Bilirubin, Direct: 0 mg/dL (ref 0.0–0.3)
Total Protein: 8.2 g/dL (ref 6.0–8.3)

## 2013-08-12 LAB — LIPID PANEL
Cholesterol: 201 mg/dL — ABNORMAL HIGH (ref 0–200)
HDL: 52.3 mg/dL (ref >39.00)

## 2013-08-12 LAB — LDL CHOLESTEROL, DIRECT: Direct LDL: 140.5 mg/dL

## 2013-09-08 ENCOUNTER — Other Ambulatory Visit: Payer: Self-pay

## 2013-12-13 ENCOUNTER — Encounter: Payer: Self-pay | Admitting: Family Medicine

## 2013-12-13 ENCOUNTER — Ambulatory Visit (INDEPENDENT_AMBULATORY_CARE_PROVIDER_SITE_OTHER): Admitting: Family Medicine

## 2013-12-13 VITALS — HR 76 | Temp 98.1°F | Wt 185.0 lb

## 2013-12-13 DIAGNOSIS — R42 Dizziness and giddiness: Secondary | ICD-10-CM

## 2013-12-13 NOTE — Progress Notes (Signed)
Pre visit review using our clinic review tool, if applicable. No additional management support is needed unless otherwise documented below in the visit note. 

## 2013-12-13 NOTE — Patient Instructions (Signed)

## 2013-12-13 NOTE — Progress Notes (Signed)
  Subjective:     Alicia Knapp is a 58 y.o. female who presents for evaluation of dizziness. The symptoms started a few hours ago and are improved. The attacks occur today only. Positions that worsen symptoms: none. Previous workup/treatments: none. Associated ear symptoms: none. Associated CNS symptoms: none. Recent infections: none. Head trauma: denied. Drug ingestion: none. Noise exposure: no occupational exposure. Family history: non-contributory.  The following portions of the patient's history were reviewed and updated as appropriate: allergies, current medications, past family history, past medical history, past social history, past surgical history and problem list.  Review of Systems Pertinent items are noted in HPI.    Objective:    Pulse 76  Temp(Src) 98.1 F (36.7 C) (Oral)  Wt 185 lb (83.915 kg)  SpO2 98% General appearance: alert, cooperative, appears stated age and no distress Ears: normal TM's and external ear canals both ears Nose: Nares normal. Septum midline. Mucosa normal. No drainage or sinus tenderness. Throat: lips, mucosa, and tongue normal; teeth and gums normal Neck: no adenopathy, supple, symmetrical, trachea midline and thyroid not enlarged, symmetric, no tenderness/mass/nodules Lungs: clear to auscultation bilaterally Heart: S1, S2 normal and + murmur Neurologic: Alert and oriented X 3, normal strength and tone. Normal symmetric reflexes. Normal coordination and gait     ekg-- sinus brady Assessment:    Vertigo    Plan:    check labs Consider changing bp meds if it con't  F/u 3 months or sooner prn

## 2013-12-14 ENCOUNTER — Telehealth: Payer: Self-pay | Admitting: Family Medicine

## 2013-12-14 LAB — CBC WITH DIFFERENTIAL/PLATELET
Basophils Absolute: 0.1 10*3/uL (ref 0.0–0.1)
Basophils Relative: 1.4 % (ref 0.0–3.0)
EOS PCT: 3.2 % (ref 0.0–5.0)
Eosinophils Absolute: 0.2 10*3/uL (ref 0.0–0.7)
HCT: 40.5 % (ref 36.0–46.0)
Hemoglobin: 13.2 g/dL (ref 12.0–15.0)
Lymphocytes Relative: 40.9 % (ref 12.0–46.0)
Lymphs Abs: 2.5 10*3/uL (ref 0.7–4.0)
MCHC: 32.7 g/dL (ref 30.0–36.0)
MCV: 92.9 fl (ref 78.0–100.0)
MONO ABS: 0.3 10*3/uL (ref 0.1–1.0)
MONOS PCT: 4.9 % (ref 3.0–12.0)
NEUTROS PCT: 49.6 % (ref 43.0–77.0)
Neutro Abs: 3 10*3/uL (ref 1.4–7.7)
PLATELETS: 326 10*3/uL (ref 150.0–400.0)
RBC: 4.36 Mil/uL (ref 3.87–5.11)
RDW: 13.9 % (ref 11.5–14.6)
WBC: 6 10*3/uL (ref 4.5–10.5)

## 2013-12-14 LAB — HEPATIC FUNCTION PANEL
ALT: 35 U/L (ref 0–35)
AST: 39 U/L — AB (ref 0–37)
Albumin: 4.1 g/dL (ref 3.5–5.2)
Alkaline Phosphatase: 59 U/L (ref 39–117)
BILIRUBIN DIRECT: 0 mg/dL (ref 0.0–0.3)
BILIRUBIN TOTAL: 0.7 mg/dL (ref 0.3–1.2)
Total Protein: 8.1 g/dL (ref 6.0–8.3)

## 2013-12-14 LAB — BASIC METABOLIC PANEL
BUN: 14 mg/dL (ref 6–23)
CO2: 28 mEq/L (ref 19–32)
CREATININE: 0.8 mg/dL (ref 0.4–1.2)
Calcium: 9.8 mg/dL (ref 8.4–10.5)
Chloride: 100 mEq/L (ref 96–112)
GFR: 102.44 mL/min (ref >60.00)
Glucose, Bld: 77 mg/dL (ref 70–99)
Potassium: 3.4 mEq/L — ABNORMAL LOW (ref 3.5–5.1)
Sodium: 138 mEq/L (ref 135–145)

## 2013-12-14 LAB — POCT URINALYSIS DIPSTICK
Bilirubin, UA: NEGATIVE
Blood, UA: NEGATIVE
Glucose, UA: NEGATIVE
KETONES UA: NEGATIVE
Leukocytes, UA: NEGATIVE
Nitrite, UA: NEGATIVE
PH UA: 6.5
PROTEIN UA: NEGATIVE
UROBILINOGEN UA: 0.2

## 2013-12-14 LAB — VITAMIN B12: Vitamin B-12: 649 pg/mL (ref 211–911)

## 2013-12-14 LAB — TSH: TSH: 1.65 u[IU]/mL (ref 0.35–5.50)

## 2013-12-14 NOTE — Telephone Encounter (Signed)
Relevant patient education assigned to patient using Emmi. ° °

## 2013-12-16 ENCOUNTER — Other Ambulatory Visit: Payer: Self-pay

## 2013-12-16 MED ORDER — POTASSIUM CHLORIDE CRYS ER 20 MEQ PO TBCR: 20.0000 meq | EXTENDED_RELEASE_TABLET | Freq: Every day | ORAL | Status: DC

## 2014-01-03 ENCOUNTER — Other Ambulatory Visit: Payer: Self-pay | Admitting: Family Medicine

## 2014-02-14 ENCOUNTER — Encounter: Payer: Self-pay | Admitting: Family Medicine

## 2014-02-14 ENCOUNTER — Ambulatory Visit (INDEPENDENT_AMBULATORY_CARE_PROVIDER_SITE_OTHER): Admitting: Family Medicine

## 2014-02-14 VITALS — BP 144/90 | HR 62 | Temp 98.3°F | Wt 182.6 lb

## 2014-02-14 DIAGNOSIS — J019 Acute sinusitis, unspecified: Secondary | ICD-10-CM

## 2014-02-14 MED ORDER — SULFAMETHOXAZOLE-TMP DS 800-160 MG PO TABS: 1.0000 | ORAL_TABLET | Freq: Two times a day (BID) | ORAL | Status: DC

## 2014-02-14 NOTE — Progress Notes (Signed)
Pre visit review using our clinic review tool, if applicable. No additional management support is needed unless otherwise documented below in the visit note. 

## 2014-02-14 NOTE — Progress Notes (Signed)
  Subjective:     Alicia Knapp is a 58 y.o. female who presents for evaluation of sinus pain. Symptoms include: congestion, facial pain, nasal congestion and sinus pressure. Onset of symptoms was 1 week ago. Symptoms have been gradually worsening since that time. Past history is significant for no history of pneumonia or bronchitis. Patient is a smoker.  The following portions of the patient's history were reviewed and updated as appropriate: allergies, current medications, past family history, past medical history, past social history, past surgical history and problem list.  Review of Systems Pertinent items are noted in HPI.   Objective:    BP 144/90  Pulse 62  Temp(Src) 98.3 F (36.8 C) (Oral)  Wt 182 lb 9.6 oz (82.827 kg)  SpO2 99% General appearance: alert, cooperative, appears stated age and no distress Ears: b/l cerumen impaction---unable to remove with hoop -- irrigated sucessfully--- no signs of infection Nose: green discharge, moderate congestion, turbinates red, swollen, sinus tenderness bilateral Throat: lips, mucosa, and tongue normal; teeth and gums normal Neck: no adenopathy, supple, symmetrical, trachea midline and thyroid not enlarged, symmetric, no tenderness/mass/nodules Lungs: clear to auscultation bilaterally Heart: S1, S2 normal Extremities: extremities normal, atraumatic, no cyanosis or edema    Assessment:    Acute bacterial sinusitis.    Plan:    Nasal steroids per medication orders. Antihistamines per medication orders. Bactrim per medication orders.

## 2014-02-14 NOTE — Patient Instructions (Signed)

## 2014-02-20 ENCOUNTER — Other Ambulatory Visit: Payer: Self-pay | Admitting: Family Medicine

## 2014-05-02 ENCOUNTER — Other Ambulatory Visit: Payer: Self-pay

## 2014-05-02 DIAGNOSIS — J302 Other seasonal allergic rhinitis: Secondary | ICD-10-CM

## 2014-05-02 MED ORDER — HYDROCHLOROTHIAZIDE 25 MG PO TABS: ORAL_TABLET | ORAL | Status: DC

## 2014-05-02 MED ORDER — AMLODIPINE BESYLATE 10 MG PO TABS: ORAL_TABLET | ORAL | Status: DC

## 2014-05-02 MED ORDER — FLUTICASONE PROPIONATE 50 MCG/ACT NA SUSP: 2.0000 | Freq: Every day | NASAL | Status: DC

## 2014-05-02 MED ORDER — CARVEDILOL 6.25 MG PO TABS: ORAL_TABLET | ORAL | Status: DC

## 2014-10-31 ENCOUNTER — Other Ambulatory Visit: Payer: Self-pay | Admitting: Family Medicine

## 2014-11-01 ENCOUNTER — Other Ambulatory Visit: Payer: Self-pay | Admitting: Family Medicine

## 2015-01-01 ENCOUNTER — Other Ambulatory Visit: Payer: Self-pay | Admitting: Family Medicine

## 2015-01-01 ENCOUNTER — Encounter: Payer: Self-pay | Admitting: Family Medicine

## 2015-01-01 ENCOUNTER — Ambulatory Visit (INDEPENDENT_AMBULATORY_CARE_PROVIDER_SITE_OTHER): Admitting: Family Medicine

## 2015-01-01 VITALS — BP 158/84 | HR 69 | Temp 98.6°F | Wt 196.8 lb

## 2015-01-01 DIAGNOSIS — Z1231 Encounter for screening mammogram for malignant neoplasm of breast: Secondary | ICD-10-CM

## 2015-01-01 DIAGNOSIS — R42 Dizziness and giddiness: Secondary | ICD-10-CM

## 2015-01-01 DIAGNOSIS — I10 Essential (primary) hypertension: Secondary | ICD-10-CM

## 2015-01-01 DIAGNOSIS — R011 Cardiac murmur, unspecified: Secondary | ICD-10-CM

## 2015-01-01 LAB — POCT URINALYSIS DIPSTICK
BILIRUBIN UA: NEGATIVE
Glucose, UA: NEGATIVE
Ketones, UA: NEGATIVE
Leukocytes, UA: NEGATIVE
Nitrite, UA: NEGATIVE
Protein, UA: NEGATIVE
RBC UA: NEGATIVE
SPEC GRAV UA: 1.02
Urobilinogen, UA: 0.2
pH, UA: 6

## 2015-01-01 MED ORDER — VALSARTAN 80 MG PO TABS: 80.0000 mg | ORAL_TABLET | Freq: Every day | ORAL | Status: DC

## 2015-01-01 MED ORDER — MECLIZINE HCL 50 MG PO TABS: 50.0000 mg | ORAL_TABLET | Freq: Three times a day (TID) | ORAL | Status: DC | PRN

## 2015-01-01 NOTE — Progress Notes (Signed)
Subjective:    Patient ID: Alicia Knapp, female    DOB: 12/20/1954, 59 y.o.   MRN: [id number]  HPI  Patient here for dizzy since Saturday.  She is also here to f/u bp  Past Medical History  Diagnosis Date  . Anxiety   . Hypertension     Review of Systems  Constitutional: Negative for activity change, appetite change, fatigue and unexpected weight change.  Respiratory: Negative for cough and shortness of breath.   Cardiovascular: Negative for chest pain and palpitations.  Neurological: Positive for dizziness and light-headedness. Negative for speech difficulty, weakness, numbness and headaches.  Psychiatric/Behavioral: Negative for behavioral problems and dysphoric mood. The patient is not nervous/anxious.        Objective:    Physical Exam  Constitutional: She is oriented to person, place, and time. She appears well-developed and well-nourished. No distress.  HENT:  Right Ear: External ear normal.  Left Ear: External ear normal.  Nose: Nose normal.  Mouth/Throat: Oropharynx is clear and moist.  Eyes: EOM are normal. Pupils are equal, round, and reactive to light. Right eye exhibits normal extraocular motion and no nystagmus. Left eye exhibits normal extraocular motion and no nystagmus.  Neck: Normal range of motion. Neck supple.  Cardiovascular: Normal rate, regular rhythm, S1 normal and S2 normal.   Murmur heard. Pulmonary/Chest: Effort normal and breath sounds normal. No respiratory distress. She has no wheezes. She has no rales. She exhibits no tenderness.  Neurological: She is alert and oriented to person, place, and time. She has normal strength. No cranial nerve deficit or sensory deficit. Coordination and gait normal.  Psychiatric: She has a normal mood and affect. Her behavior is normal. Judgment and thought content normal.    BP 158/84 mmHg  Pulse 69  Temp(Src) 98.6 F (37 C) (Oral)  Wt 196 lb 12.8 oz (89.268 kg)  SpO2 96% Wt Readings from Last 3 Encounters:    01/01/15 196 lb 12.8 oz (89.268 kg)  02/14/14 182 lb 9.6 oz (82.827 kg)  12/13/13 185 lb (83.915 kg)     Lab Results  Component Value Date   WBC 6.0 12/13/2013   HGB 13.2 12/13/2013   HCT 40.5 12/13/2013   PLT 326.0 12/13/2013   GLUCOSE 77 12/13/2013   CHOL 201* 08/12/2013   TRIG 53.0 08/12/2013   HDL 52.30 08/12/2013   LDLDIRECT 140.5 08/12/2013   LDLCALC 130* 12/20/2012   ALT 35 12/13/2013   AST 39* 12/13/2013   NA 138 12/13/2013   K 3.4* 12/13/2013   CL 100 12/13/2013   CREATININE 0.8 12/13/2013   BUN 14 12/13/2013   CO2 28 12/13/2013   TSH 1.65 12/13/2013    No results found.     Assessment & Plan:   Problem List Items Addressed This Visit    None    Visit Diagnoses    Dizzy    -  Primary    Relevant Medications    meclizine (ANTIVERT) tablet    Other Relevant Orders    Basic metabolic panel    CBC with Differential/Platelet    Vitamin B12    Hepatic function panel    POCT urinalysis dipstick    Encounter for screening mammogram for breast cancer        Relevant Orders    MM Digital Screening    Essential hypertension        Relevant Medications    valsartan (DIOVAN) tablet    Other Relevant Orders    Basic metabolic panel  Murmur, cardiac        Relevant Orders    2D Echocardiogram without contrast        Garnet Koyanagi, DO

## 2015-01-01 NOTE — Patient Instructions (Signed)

## 2015-01-01 NOTE — Progress Notes (Signed)
Pre visit review using our clinic review tool, if applicable. No additional management support is needed unless otherwise documented below in the visit note. 

## 2015-01-02 ENCOUNTER — Telehealth: Payer: Self-pay | Admitting: Family Medicine

## 2015-01-02 LAB — CBC WITH DIFFERENTIAL/PLATELET
BASOS ABS: 0 10*3/uL (ref 0.0–0.1)
Basophils Relative: 0.6 % (ref 0.0–3.0)
EOS ABS: 0.1 10*3/uL (ref 0.0–0.7)
Eosinophils Relative: 2.2 % (ref 0.0–5.0)
HEMATOCRIT: 40.5 % (ref 36.0–46.0)
HEMOGLOBIN: 13.7 g/dL (ref 12.0–15.0)
Lymphocytes Relative: 31.6 % (ref 12.0–46.0)
Lymphs Abs: 1.5 10*3/uL (ref 0.7–4.0)
MCHC: 33.9 g/dL (ref 30.0–36.0)
MCV: 87.7 fl (ref 78.0–100.0)
Monocytes Absolute: 0.4 10*3/uL (ref 0.1–1.0)
Monocytes Relative: 8.7 % (ref 3.0–12.0)
NEUTROS ABS: 2.7 10*3/uL (ref 1.4–7.7)
Neutrophils Relative %: 56.9 % (ref 43.0–77.0)
Platelets: 405 10*3/uL — ABNORMAL HIGH (ref 150.0–400.0)
RBC: 4.62 Mil/uL (ref 3.87–5.11)
RDW: 13.3 % (ref 11.5–15.5)
WBC: 4.7 10*3/uL (ref 4.0–10.5)

## 2015-01-02 LAB — HEPATIC FUNCTION PANEL
ALBUMIN: 4.6 g/dL (ref 3.5–5.2)
ALT: 20 U/L (ref 0–35)
AST: 21 U/L (ref 0–37)
Alkaline Phosphatase: 76 U/L (ref 39–117)
BILIRUBIN DIRECT: 0 mg/dL (ref 0.0–0.3)
BILIRUBIN TOTAL: 0.4 mg/dL (ref 0.2–1.2)
Total Protein: 9.1 g/dL — ABNORMAL HIGH (ref 6.0–8.3)

## 2015-01-02 LAB — VITAMIN B12: Vitamin B-12: 638 pg/mL (ref 211–911)

## 2015-01-02 LAB — BASIC METABOLIC PANEL
BUN: 15 mg/dL (ref 6–23)
CHLORIDE: 99 meq/L (ref 96–112)
CO2: 29 meq/L (ref 19–32)
Calcium: 10.7 mg/dL — ABNORMAL HIGH (ref 8.4–10.5)
Creatinine, Ser: 0.78 mg/dL (ref 0.40–1.20)
GFR: 97.54 mL/min (ref >60.00)
GLUCOSE: 84 mg/dL (ref 70–99)
POTASSIUM: 4 meq/L (ref 3.5–5.1)
Sodium: 137 mEq/L (ref 135–145)

## 2015-01-02 NOTE — Telephone Encounter (Signed)
Please advise      KP 

## 2015-01-02 NOTE — Telephone Encounter (Signed)
If labs come back abnormal that will help---- waiting for those  She can try otc dramamine  but the meclizine was supposed to help the dizziness

## 2015-01-02 NOTE — Telephone Encounter (Signed)
What do you want her to do about the dizziness?

## 2015-01-02 NOTE — Telephone Encounter (Signed)
Labs are not back yet-- if all normal we will refer to neuro for dizziness

## 2015-01-02 NOTE — Telephone Encounter (Signed)
Caller name: Joee, [place] Relation to pt: self  Call back number: [phone]   Reason for call:  Pt states meclizine is making her light headed, and dizzy as if the room is spinning. Pt took 2 pills yesterday and has stopped taking medication. Please advise

## 2015-01-03 NOTE — Telephone Encounter (Signed)
Patient has been made aware of the below recommendations and verbalized understanding.      KP

## 2015-01-04 ENCOUNTER — Ambulatory Visit (HOSPITAL_COMMUNITY): Attending: Family Medicine | Admitting: Radiology

## 2015-01-04 ENCOUNTER — Encounter (HOSPITAL_COMMUNITY): Payer: Self-pay | Admitting: Radiology

## 2015-01-04 ENCOUNTER — Telehealth: Payer: Self-pay | Admitting: Family Medicine

## 2015-01-04 ENCOUNTER — Other Ambulatory Visit: Payer: Self-pay

## 2015-01-04 DIAGNOSIS — R42 Dizziness and giddiness: Secondary | ICD-10-CM

## 2015-01-04 DIAGNOSIS — R011 Cardiac murmur, unspecified: Secondary | ICD-10-CM | POA: Insufficient documentation

## 2015-01-04 NOTE — Telephone Encounter (Signed)
Please advise      KP 

## 2015-01-04 NOTE — Telephone Encounter (Signed)
Ok to extend note to over weekend

## 2015-01-04 NOTE — Progress Notes (Signed)
Echocardiogram performed.  

## 2015-01-04 NOTE — Telephone Encounter (Signed)
Caller name: Ariely Relation to pt: self Call back number: [phone] Pharmacy:  Reason for call:   Patient states that she still isn't feeling well and would like Dr Etter Sjogren to write her out of work for Friday and Saturday

## 2015-01-04 NOTE — Telephone Encounter (Signed)
Patient aware that she can pick up or print work note.     KP

## 2015-01-05 ENCOUNTER — Other Ambulatory Visit (INDEPENDENT_AMBULATORY_CARE_PROVIDER_SITE_OTHER)

## 2015-01-05 DIAGNOSIS — R42 Dizziness and giddiness: Secondary | ICD-10-CM

## 2015-01-08 ENCOUNTER — Telehealth: Payer: Self-pay | Admitting: Family Medicine

## 2015-01-08 LAB — PARATHYROID HORMONE, INTACT (NO CA): PTH: 46 pg/mL (ref 14–64)

## 2015-01-08 NOTE — Telephone Encounter (Signed)
Please advise      KP 

## 2015-01-08 NOTE — Telephone Encounter (Signed)
Is it just an mra head or do you want mri brain and head

## 2015-01-08 NOTE — Telephone Encounter (Signed)
Mri , mra of brain for dizziness, headache

## 2015-01-09 ENCOUNTER — Inpatient Hospital Stay: Admission: RE | Admit: 2015-01-09 | Source: Ambulatory Visit

## 2015-01-09 ENCOUNTER — Other Ambulatory Visit

## 2015-01-09 ENCOUNTER — Other Ambulatory Visit: Payer: Self-pay | Admitting: Family Medicine

## 2015-01-09 DIAGNOSIS — R42 Dizziness and giddiness: Secondary | ICD-10-CM

## 2015-01-10 ENCOUNTER — Ambulatory Visit
Admission: RE | Admit: 2015-01-10 | Discharge: 2015-01-10 | Disposition: A | Discharge status: Home / Self Care | Source: Ambulatory Visit | Attending: Family Medicine | Admitting: Family Medicine

## 2015-01-10 ENCOUNTER — Other Ambulatory Visit: Payer: Self-pay | Admitting: Family Medicine

## 2015-01-10 DIAGNOSIS — R42 Dizziness and giddiness: Secondary | ICD-10-CM

## 2015-01-12 ENCOUNTER — Telehealth: Payer: Self-pay

## 2015-01-12 DIAGNOSIS — I6501 Occlusion and stenosis of right vertebral artery: Secondary | ICD-10-CM

## 2015-01-12 NOTE — Telephone Encounter (Signed)
Stat referral placed.

## 2015-01-12 NOTE — Telephone Encounter (Signed)
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  No need for pt to go to ER tonight but will need STAT neuro referral for likely additional neck imaging and evaluation.

## 2015-01-15 ENCOUNTER — Telehealth: Payer: Self-pay | Admitting: Family Medicine

## 2015-01-15 NOTE — Telephone Encounter (Signed)
Caller name: Alynn, [place] Relation to pt: self   Call back number: [phone]   Reason for call:  Pt inquiring about MRI results

## 2015-01-15 NOTE — Telephone Encounter (Signed)
Spoke with the patient and she verbalized understanding, she is still having the dizziness and will await for a call from Neuro. She also stated she is waiting on her FMLA paperwork, she ishe has not been able to go to work.        KP

## 2015-01-15 NOTE — Telephone Encounter (Signed)
I haven't seen any paperwork

## 2015-01-15 NOTE — Telephone Encounter (Signed)
She will have it sent over. this was an [place].     KP

## 2015-01-15 NOTE — Telephone Encounter (Signed)
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 No need for pt to go to ER tonight but will need STAT neuro referral for likely additional neck imaging and evaluation

## 2015-01-23 ENCOUNTER — Telehealth: Payer: Self-pay | Admitting: *Deleted

## 2015-01-23 NOTE — Telephone Encounter (Signed)
Pt dropped off FMLA paperwork. Forms filled out as much as possible and forwarded to Dr. Etter Sjogren. JG//CMA

## 2015-01-25 DIAGNOSIS — Z7689 Persons encountering health services in other specified circumstances: Secondary | ICD-10-CM

## 2015-01-30 ENCOUNTER — Ambulatory Visit (INDEPENDENT_AMBULATORY_CARE_PROVIDER_SITE_OTHER): Admitting: Neurology

## 2015-01-30 ENCOUNTER — Encounter: Payer: Self-pay | Admitting: Neurology

## 2015-01-30 ENCOUNTER — Other Ambulatory Visit: Payer: Self-pay | Admitting: Family Medicine

## 2015-01-30 VITALS — BP 134/78 | HR 70 | Temp 98.3°F | Resp 16 | Ht 66.5 in | Wt 209.2 lb

## 2015-01-30 DIAGNOSIS — I6501 Occlusion and stenosis of right vertebral artery: Secondary | ICD-10-CM

## 2015-01-30 DIAGNOSIS — H8111 Benign paroxysmal vertigo, right ear: Secondary | ICD-10-CM

## 2015-01-30 NOTE — Progress Notes (Signed)
NEUROLOGY CONSULTATION NOTE  Alicia Knapp MRN: [id number] DOB: 12/20/1954  Referring provider: Dr. Etter Sjogren Primary care provider: Dr. Etter Sjogren  Reason for consult:  Dizziness, Vertebral artery occlusion  HISTORY OF PRESENT ILLNESS: Alicia Knapp is a 59 year old right-handed woman with hypertension and anxiety who presents for vertebral artery occlusion.  Records, labs, echo report and MRI and MRA of brain reviewed.  She is accompanied by her husband who provides some history.  She began experiencing dizziness a month ago.  Before that, she developed fatigue.  She described it as a brief spinning sensation that occurs when she wakes up in bed.  It tends to occur when she is turned to the right and lasts 5 seconds.  It is not associated with nausea, slurred speech or focal numbness or weakness.  It occurred two other times, again only happening when she is lying in bed and lasting about 3-5 seconds.  In addition, she feels "lightheaded", meaning "woozy".  She doesn't feel like she is going to pass out.  Labs, including CBC, BMP, LFT and B12 revealed no remarkable abnormalities.  She was noted to have a murmur, so 2D echo was performed which showed LVEF 60-65% and grade 1 diastolic dysfunction.  She had an MRI and MRA of the head performed on 01/10/15.  The brain was unremarkable with no evidence of posterior circulation infarction.  MRA showed absent antegrade flow in the right vertebral artery but there is collateral flow.   Meclizine made her feel sick.  She has been taking dramamine every morning.  The lightheaded has improved but still present.  She has not had any recent vertigo.  She notes some mild ear pain in the left ear.  She denies tinnitus.     PAST MEDICAL HISTORY: Past Medical History  Diagnosis Date  . Anxiety   . Hypertension     PAST SURGICAL HISTORY: Past Surgical History  Procedure Laterality Date  . Abdominal hysterectomy      MEDICATIONS: Current Outpatient  Prescriptions on File Prior to Visit  Medication Sig Dispense Refill  . amLODipine (NORVASC) 10 MG tablet Take 1 tablet (10 mg total) by mouth daily. 90 tablet 1  . ARIPiprazole (ABILIFY) 10 MG tablet Take 10 mg by mouth daily.      [name] aspirin 81 MG tablet Take 81 mg by mouth daily.      . carvedilol (COREG) 6.25 MG tablet TAKE 1 TABLET TWICE A DAY WITH MEALS 180 tablet 1  . cholecalciferol (VITAMIN D) 1000 UNITS tablet Take 1,000 Units by mouth daily.    . fexofenadine (ALLEGRA) 180 MG tablet Take 1 tablet (180 mg total) by mouth daily. 30 tablet 11  . fluticasone (FLONASE) 50 MCG/ACT nasal spray Place 2 sprays into both nostrils daily. 48 g 2  . hydrochlorothiazide (HYDRODIURIL) 25 MG tablet Take 1 tablet (25 mg total) by mouth daily. 90 tablet 1  . LORazepam (ATIVAN) 1 MG tablet Take 1 mg by mouth every 8 (eight) hours.      . meclizine (ANTIVERT) 50 MG tablet Take 1 tablet (50 mg total) by mouth 3 (three) times daily as needed. 30 tablet 0  . Omega-3 Fatty Acids (FISH OIL) 1000 MG CAPS Take by mouth 2 (two) times daily. Take two     . potassium chloride SA (K-DUR,KLOR-CON) 20 MEQ tablet Take 1 tablet (20 mEq total) by mouth daily. 30 tablet 2  . valsartan (DIOVAN) 80 MG tablet Take 1 tablet (80 mg total) by mouth  daily. 30 tablet 0   No current facility-administered medications on file prior to visit.    ALLERGIES: Allergies  Allergen Reactions  . Erythromycin   . Penicillins     FAMILY HISTORY: Family History  Problem Relation Age of Onset  . Cancer Father     prostate  . Arthritis Maternal Grandmother   . Diabetes Mother   . Thyroid disease Mother   . Hypertension Sister   . Hypertension Brother   . Stroke Brother     SOCIAL HISTORY: History   Social History  . Marital Status: Married    Spouse Name: N/A  . Number of Children: N/A  . Years of Education: N/A   Occupational History  . Not on file.   Social History Main Topics  . Smoking status: Former Smoker     Quit date: 07/01/2014  . Smokeless tobacco: Never Used  . Alcohol Use: No  . Drug Use: No  . Sexual Activity:    Partners: Male   Other Topics Concern  . Not on file   Social History Narrative    REVIEW OF SYSTEMS: Constitutional: No fevers, chills, or sweats, no generalized fatigue, change in appetite Eyes: No visual changes, double vision, eye pain Ear, nose and throat: No hearing loss, ear pain, nasal congestion, sore throat Cardiovascular: No chest pain, palpitations Respiratory:  No shortness of breath at rest or with exertion, wheezes GastrointestinaI: No nausea, vomiting, diarrhea, abdominal pain, fecal incontinence Genitourinary:  No dysuria, urinary retention or frequency Musculoskeletal:  No neck pain, back pain Integumentary: No rash, pruritus, skin lesions Neurological: as above Psychiatric: No depression, insomnia, anxiety Endocrine: No palpitations, fatigue, diaphoresis, mood swings, change in appetite, change in weight, increased thirst Hematologic/Lymphatic:  No anemia, purpura, petechiae. Allergic/Immunologic: no itchy/runny eyes, nasal congestion, recent allergic reactions, rashes  PHYSICAL EXAM: Filed Vitals:   01/30/15 1103  BP: 134/78  Pulse: 70  Temp: 98.3 F (36.8 C)  Resp: 16   General: No acute distress Head:  Normocephalic/atraumatic Eyes:  fundi unremarkable, without vessel changes, exudates, hemorrhages or papilledema. Neck: supple, no paraspinal tenderness, full range of motion Back: No paraspinal tenderness Heart: regular rate and rhythm Lungs: Clear to auscultation bilaterally. Vascular: No carotid bruits. Neurological Exam: Mental status: alert and oriented to person, place, and time, recent and remote memory intact, fund of knowledge intact, attention and concentration intact, speech fluent and not dysarthric, language intact. Cranial nerves: CN I: not tested CN II: pupils equal, round and reactive to light, visual fields intact,  fundi unremarkable, without vessel changes, exudates, hemorrhages or papilledema. CN III, IV, VI:  full range of motion, no nystagmus, no ptosis CN V: facial sensation intact CN VII: upper and lower face symmetric CN VIII: hearing intact CN IX, X: gag intact, uvula midline CN XI: sternocleidomastoid and trapezius muscles intact CN XII: tongue midline Bulk & Tone: normal, no fasciculations. Motor:  5/5 throughout Sensation:  Temperature and vibration intact Deep Tendon Reflexes:  2+ throughout, toes downgoing Finger to nose testing:  No dysmetria Heel to shin:  No dysmetria Gait:  Normal station and stride.  Able to turn and walk in tandem. Romberg negative.  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  Incidental finding. Rest of posterior circulation, including basilar and circle of Willis, patent. Probable benign paroxysmal positional vertigo, unrelated to MRA findings. Lightheadedness.  Unclear etiology and whether it is related to a peripheral vestibulopathy.  PLAN: 1.  I would try to limit use or even discontinue  dramamine 2.  As for the MRA findings, there is nothing active to do about it.  Only suggestion would be ASA 81mg  daily, which she is taking (given that she has stroke risk factors). 3.  If vertigo recurs, consider vestibular rehab  Follow up as needed.  Thank you for allowing me to take part in the care of this patient.  Metta Clines, DO  CC:  Garnet Koyanagi, DO

## 2015-01-30 NOTE — Patient Instructions (Signed)
I don't think the dizziness and spinning is related to the vertebral artery.  I think it is inner ear symptoms.   I would ease off of the dramamine I would continue taking a baby aspirin daily If spinning recurs, may want to consider vestibular rehab. Follow up as needed.

## 2015-02-01 ENCOUNTER — Telehealth: Payer: Self-pay | Admitting: Family Medicine

## 2015-02-01 NOTE — Telephone Encounter (Signed)
Pre Visit letter sent  °

## 2015-02-02 ENCOUNTER — Ambulatory Visit (INDEPENDENT_AMBULATORY_CARE_PROVIDER_SITE_OTHER): Admitting: Family Medicine

## 2015-02-02 ENCOUNTER — Encounter: Payer: Self-pay | Admitting: Family Medicine

## 2015-02-02 VITALS — BP 142/82 | HR 73 | Temp 98.1°F | Wt 210.0 lb

## 2015-02-02 DIAGNOSIS — H8110 Benign paroxysmal vertigo, unspecified ear: Secondary | ICD-10-CM

## 2015-02-02 NOTE — Progress Notes (Signed)
Pre visit review using our clinic review tool, if applicable. No additional management support is needed unless otherwise documented below in the visit note. 

## 2015-02-02 NOTE — Progress Notes (Signed)
Subjective:    Patient ID: Alicia Knapp, female    DOB: 12/20/1954, 59 y.o.   MRN: [id number]  HPI  Patient here for f/u neuro.  They advised possible vestibular rehab if symptoms did not improve.  Pt is no better.  Neuro notes reviewed.    Past Medical History  Diagnosis Date  . Anxiety   . Hypertension     Review of Systems  Constitutional: Negative for activity change, appetite change, fatigue and unexpected weight change.  Respiratory: Negative for cough and shortness of breath.   Cardiovascular: Negative for chest pain and palpitations.  Neurological: Positive for dizziness and light-headedness.  Psychiatric/Behavioral: Negative for behavioral problems and dysphoric mood. The patient is not nervous/anxious.        Objective:    Physical Exam  Constitutional: She is oriented to person, place, and time. She appears well-developed and well-nourished. No distress.  HENT:  Head: Normocephalic and atraumatic.  Mouth/Throat: Uvula is midline and mucous membranes are normal.  TMs WNL No TTP over sinuses Minimal nasal congestion  Eyes: Conjunctivae and EOM are normal. Pupils are equal, round, and reactive to light.  2-3 beats of horizontal nystagmus  Neck: Normal range of motion. Neck supple.  Cardiovascular: Normal rate, regular rhythm, normal heart sounds and intact distal pulses.   Pulmonary/Chest: Effort normal and breath sounds normal. No respiratory distress. She has no wheezes. She has no rales.  Musculoskeletal: She exhibits no edema.  Lymphadenopathy:    She has no cervical adenopathy.  Neurological: She is alert and oriented to person, place, and time. She has normal reflexes. No cranial nerve deficit.  + Marye Round  Skin: Skin is warm and dry.  Psychiatric: She has a normal mood and affect. Her behavior is normal. Judgment and thought content normal.  Vitals reviewed.   BP 142/82 mmHg  Pulse 73  Temp(Src) 98.1 F (36.7 C) (Oral)  Wt 210 lb (95.255 kg)   SpO2 96% Wt Readings from Last 3 Encounters:  02/02/15 210 lb (95.255 kg)  01/30/15 209 lb 3.2 oz (94.892 kg)  01/10/15 196 lb (88.905 kg)     Lab Results  Component Value Date   WBC 4.7 01/01/2015   HGB 13.7 01/01/2015   HCT 40.5 01/01/2015   PLT 405.0* 01/01/2015   GLUCOSE 84 01/01/2015   CHOL 201* 08/12/2013   TRIG 53.0 08/12/2013   HDL 52.30 08/12/2013   LDLDIRECT 140.5 08/12/2013   LDLCALC 130* 12/20/2012   ALT 20 01/01/2015   AST 21 01/01/2015   NA 137 01/01/2015   K 4.0 01/01/2015   CL 99 01/01/2015   CREATININE 0.78 01/01/2015   BUN 15 01/01/2015   CO2 29 01/01/2015   TSH 1.65 12/13/2013    Mr Jodene Nam Head Wo Contrast  01/10/2015   CLINICAL DATA:  Dizziness.  Hypercalcemia.  Hypertension.  EXAM: MRI HEAD WITHOUT CONTRAST  MRA HEAD WITHOUT CONTRAST  TECHNIQUE: Multiplanar, multiecho pulse sequences of the brain and surrounding structures were obtained without intravenous contrast. Angiographic images of the head were obtained using MRA technique without contrast.  COMPARISON:  None.  FINDINGS: MRI HEAD FINDINGS  The brain has a normal appearance on all pulse sequences without evidence of malformation, atrophy, old or acute infarction, mass lesion, hemorrhage, hydrocephalus or extra-axial collection. No pituitary mass. No fluid in the sinuses, middle ears or mastoids. No skull or skullbase lesion. There is flow in the major vessels at the base of the brain. Major venous sinuses show flow.  MRA  HEAD FINDINGS  Both internal carotid arteries are widely patent into the brain. The anterior and middle cerebral vessels are patent without proximal stenosis, aneurysm or vascular malformation.  The left vertebral artery is a large vessel widely patent to the basilar. There is no antegrade flow in the right vertebral artery. There is retrograde flow in the distal right vertebral artery, presumably to serve PICA. No basilar stenosis. Superior cerebellar and posterior cerebral arteries are  patent.  IMPRESSION: MRI brain: Normal appearance  Intracranial MRA: No antegrade flow in the right vertebral artery. This could relate to the presenting symptoms. Age of this abnormality is unknown. There does not appear to be any posterior circulation infarction.   Electronically Signed   By: Nelson Chimes M.D.   On: 01/10/2015 18:26   Mr Brain Wo Contrast  01/10/2015   CLINICAL DATA:  Dizziness.  Hypercalcemia.  Hypertension.  EXAM: MRI HEAD WITHOUT CONTRAST  MRA HEAD WITHOUT CONTRAST  TECHNIQUE: Multiplanar, multiecho pulse sequences of the brain and surrounding structures were obtained without intravenous contrast. Angiographic images of the head were obtained using MRA technique without contrast.  COMPARISON:  None.  FINDINGS: MRI HEAD FINDINGS  The brain has a normal appearance on all pulse sequences without evidence of malformation, atrophy, old or acute infarction, mass lesion, hemorrhage, hydrocephalus or extra-axial collection. No pituitary mass. No fluid in the sinuses, middle ears or mastoids. No skull or skullbase lesion. There is flow in the major vessels at the base of the brain. Major venous sinuses show flow.  MRA HEAD FINDINGS  Both internal carotid arteries are widely patent into the brain. The anterior and middle cerebral vessels are patent without proximal stenosis, aneurysm or vascular malformation.  The left vertebral artery is a large vessel widely patent to the basilar. There is no antegrade flow in the right vertebral artery. There is retrograde flow in the distal right vertebral artery, presumably to serve PICA. No basilar stenosis. Superior cerebellar and posterior cerebral arteries are patent.  IMPRESSION: MRI brain: Normal appearance  Intracranial MRA: No antegrade flow in the right vertebral artery. This could relate to the presenting symptoms. Age of this abnormality is unknown. There does not appear to be any posterior circulation infarction.   Electronically Signed   By: Nelson Chimes M.D.   On: 01/10/2015 18:26       Assessment & Plan:   Problem List Items Addressed This Visit    None    Visit Diagnoses    Benign paroxysmal positional vertigo, unspecified laterality    -  Primary    Relevant Orders    PT vestibular rehab        Garnet Koyanagi, DO

## 2015-02-02 NOTE — Patient Instructions (Signed)

## 2015-02-21 ENCOUNTER — Telehealth: Payer: Self-pay | Admitting: *Deleted

## 2015-02-21 ENCOUNTER — Encounter: Payer: Self-pay | Admitting: *Deleted

## 2015-02-21 NOTE — Telephone Encounter (Signed)
Pre-Visit Call completed with patient and chart updated.   Pre-Visit Info documented in Specialty Comments under [employer].    

## 2015-02-22 ENCOUNTER — Encounter: Payer: Self-pay | Admitting: Family Medicine

## 2015-02-22 ENCOUNTER — Ambulatory Visit
Admission: RE | Admit: 2015-02-22 | Discharge: 2015-02-22 | Disposition: A | Discharge status: Home / Self Care | Source: Ambulatory Visit | Attending: Family Medicine | Admitting: Family Medicine

## 2015-02-22 ENCOUNTER — Other Ambulatory Visit: Payer: Self-pay | Admitting: Family Medicine

## 2015-02-22 ENCOUNTER — Ambulatory Visit (INDEPENDENT_AMBULATORY_CARE_PROVIDER_SITE_OTHER): Admitting: Family Medicine

## 2015-02-22 ENCOUNTER — Other Ambulatory Visit (HOSPITAL_COMMUNITY)
Admission: RE | Admit: 2015-02-22 | Discharge: 2015-02-22 | Disposition: A | Discharge status: Home / Self Care | Source: Ambulatory Visit | Attending: Family Medicine | Admitting: Family Medicine

## 2015-02-22 VITALS — BP 140/82 | HR 56 | Temp 98.0°F | Ht 66.0 in | Wt 209.0 lb

## 2015-02-22 DIAGNOSIS — Z01419 Encounter for gynecological examination (general) (routine) without abnormal findings: Secondary | ICD-10-CM | POA: Insufficient documentation

## 2015-02-22 DIAGNOSIS — H8110 Benign paroxysmal vertigo, unspecified ear: Secondary | ICD-10-CM

## 2015-02-22 DIAGNOSIS — Z124 Encounter for screening for malignant neoplasm of cervix: Secondary | ICD-10-CM | POA: Diagnosis not present

## 2015-02-22 DIAGNOSIS — Z1231 Encounter for screening mammogram for malignant neoplasm of breast: Secondary | ICD-10-CM

## 2015-02-22 DIAGNOSIS — Z0000 Encounter for general adult medical examination without abnormal findings: Secondary | ICD-10-CM | POA: Diagnosis not present

## 2015-02-22 LAB — CBC WITH DIFFERENTIAL/PLATELET
BASOS PCT: 0.6 % (ref 0.0–3.0)
Basophils Absolute: 0 10*3/uL (ref 0.0–0.1)
EOS ABS: 0.1 10*3/uL (ref 0.0–0.7)
Eosinophils Relative: 3.3 % (ref 0.0–5.0)
HEMATOCRIT: 37.8 % (ref 36.0–46.0)
HEMOGLOBIN: 12.7 g/dL (ref 12.0–15.0)
Lymphocytes Relative: 44.6 % (ref 12.0–46.0)
Lymphs Abs: 1.7 10*3/uL (ref 0.7–4.0)
MCHC: 33.5 g/dL (ref 30.0–36.0)
MCV: 86.6 fl (ref 78.0–100.0)
Monocytes Absolute: 0.2 10*3/uL (ref 0.1–1.0)
Monocytes Relative: 5.8 % (ref 3.0–12.0)
NEUTROS ABS: 1.8 10*3/uL (ref 1.4–7.7)
Neutrophils Relative %: 45.7 % (ref 43.0–77.0)
Platelets: 361 10*3/uL (ref 150.0–400.0)
RBC: 4.37 Mil/uL (ref 3.87–5.11)
RDW: 14.3 % (ref 11.5–15.5)
WBC: 3.9 10*3/uL — ABNORMAL LOW (ref 4.0–10.5)

## 2015-02-22 LAB — HEPATIC FUNCTION PANEL
ALT: 20 U/L (ref 0–35)
AST: 22 U/L (ref 0–37)
Albumin: 4.3 g/dL (ref 3.5–5.2)
Alkaline Phosphatase: 65 U/L (ref 39–117)
Bilirubin, Direct: 0 mg/dL (ref 0.0–0.3)
TOTAL PROTEIN: 8.2 g/dL (ref 6.0–8.3)
Total Bilirubin: 0.4 mg/dL (ref 0.2–1.2)

## 2015-02-22 LAB — POCT URINALYSIS DIPSTICK
BILIRUBIN UA: NEGATIVE
Blood, UA: NEGATIVE
GLUCOSE UA: NEGATIVE
KETONES UA: NEGATIVE
Leukocytes, UA: NEGATIVE
Nitrite, UA: NEGATIVE
PH UA: 6.5
Protein, UA: NEGATIVE
Spec Grav, UA: 1.015
Urobilinogen, UA: 0.2

## 2015-02-22 LAB — BASIC METABOLIC PANEL
BUN: 10 mg/dL (ref 6–23)
CO2: 31 mEq/L (ref 19–32)
CREATININE: 0.8 mg/dL (ref 0.40–1.20)
Calcium: 10.5 mg/dL (ref 8.4–10.5)
Chloride: 101 mEq/L (ref 96–112)
GFR: 94.69 mL/min (ref >60.00)
Glucose, Bld: 87 mg/dL (ref 70–99)
Potassium: 4 mEq/L (ref 3.5–5.1)
Sodium: 137 mEq/L (ref 135–145)

## 2015-02-22 LAB — TSH: TSH: 1.21 u[IU]/mL (ref 0.35–4.50)

## 2015-02-22 LAB — LIPID PANEL
CHOLESTEROL: 184 mg/dL (ref 0–200)
HDL: 43.4 mg/dL (ref >39.00)
LDL Cholesterol: 122 mg/dL — ABNORMAL HIGH (ref 0–99)
NonHDL: 140.6
TRIGLYCERIDES: 91 mg/dL (ref 0.0–149.0)
Total CHOL/HDL Ratio: 4
VLDL: 18.2 mg/dL (ref 0.0–40.0)

## 2015-02-22 LAB — MICROALBUMIN / CREATININE URINE RATIO
CREATININE, U: 26.5 mg/dL
Microalb Creat Ratio: 2.6 mg/g (ref 0.0–30.0)
Microalb, Ur: <0.7 mg/dL (ref 0.0–1.9)

## 2015-02-22 MED ORDER — VALSARTAN 80 MG PO TABS: 80.0000 mg | ORAL_TABLET | Freq: Every day | ORAL | Status: DC

## 2015-02-22 NOTE — Patient Instructions (Signed)
Preventive Care for Adults A healthy lifestyle and preventive care can promote health and wellness. Preventive health guidelines for women include the following key practices.  A routine yearly physical is a good way to check with your health care provider about your health and preventive screening. It is a chance to share any concerns and updates on your health and to receive a thorough exam.  Visit your dentist for a routine exam and preventive care every 6 months. Brush your teeth twice a day and floss once a day. Good oral hygiene prevents tooth decay and gum disease.  The frequency of eye exams is based on your age, health, family medical history, use of contact lenses, and other factors. Follow your health care provider's recommendations for frequency of eye exams.  Eat a healthy diet. Foods like vegetables, fruits, whole grains, low-fat dairy products, and lean protein foods contain the nutrients you need without too many calories. Decrease your intake of foods high in solid fats, added sugars, and salt. Eat the right amount of calories for you.Get information about a proper diet from your health care provider, if necessary.  Regular physical exercise is one of the most important things you can do for your health. Most adults should get at least 150 minutes of moderate-intensity exercise (any activity that increases your heart rate and causes you to sweat) each week. In addition, most adults need muscle-strengthening exercises on 2 or more days a week.  Maintain a healthy weight. The body mass index (BMI) is a screening tool to identify possible weight problems. It provides an estimate of body fat based on height and weight. Your health care provider can find your BMI and can help you achieve or maintain a healthy weight.For adults 20 years and older:  A BMI below 18.5 is considered underweight.  A BMI of 18.5 to 24.9 is normal.  A BMI of 25 to 29.9 is considered overweight.  A BMI of  30 and above is considered obese.  Maintain normal blood lipids and cholesterol levels by exercising and minimizing your intake of saturated fat. Eat a balanced diet with plenty of fruit and vegetables. Blood tests for lipids and cholesterol should begin at age 76 and be repeated every 5 years. If your lipid or cholesterol levels are high, you are over 50, or you are at high risk for heart disease, you may need your cholesterol levels checked more frequently.Ongoing high lipid and cholesterol levels should be treated with medicines if diet and exercise are not working.  If you smoke, find out from your health care provider how to quit. If you do not use tobacco, do not start.  Lung cancer screening is recommended for adults aged 22-80 years who are at high risk for developing lung cancer because of a history of smoking. A yearly low-dose CT scan of the lungs is recommended for people who have at least a 30-pack-year history of smoking and are a current smoker or have quit within the past 15 years. A pack year of smoking is smoking an average of 1 pack of cigarettes a day for 1 year (for example: 1 pack a day for 30 years or 2 packs a day for 15 years). Yearly screening should continue until the smoker has stopped smoking for at least 15 years. Yearly screening should be stopped for people who develop a health problem that would prevent them from having lung cancer treatment.  If you are pregnant, do not drink alcohol. If you are breastfeeding,  be very cautious about drinking alcohol. If you are not pregnant and choose to drink alcohol, do not have more than 1 drink per day. One drink is considered to be 12 ounces (355 mL) of beer, 5 ounces (148 mL) of wine, or 1.5 ounces (44 mL) of liquor.  Avoid use of street drugs. Do not share needles with anyone. Ask for help if you need support or instructions about stopping the use of drugs.  High blood pressure causes heart disease and increases the risk of  stroke. Your blood pressure should be checked at least every 1 to 2 years. Ongoing high blood pressure should be treated with medicines if weight loss and exercise do not work.  If you are 3-86 years old, ask your health care provider if you should take aspirin to prevent strokes.  Diabetes screening involves taking a blood sample to check your fasting blood sugar level. This should be done once every 3 years, after age 67, if you are within normal weight and without risk factors for diabetes. Testing should be considered at a younger age or be carried out more frequently if you are overweight and have at least 1 risk factor for diabetes.  Breast cancer screening is essential preventive care for women. You should practice "breast self-awareness." This means understanding the normal appearance and feel of your breasts and may include breast self-examination. Any changes detected, no matter how small, should be reported to a health care provider. Women in their 8s and 30s should have a clinical breast exam (CBE) by a health care provider as part of a regular health exam every 1 to 3 years. After age 70, women should have a CBE every year. Starting at age 25, women should consider having a mammogram (breast X-ray test) every year. Women who have a family history of breast cancer should talk to their health care provider about genetic screening. Women at a high risk of breast cancer should talk to their health care providers about having an MRI and a mammogram every year.  Breast cancer gene (BRCA)-related cancer risk assessment is recommended for women who have family members with BRCA-related cancers. BRCA-related cancers include breast, ovarian, tubal, and peritoneal cancers. Having family members with these cancers may be associated with an increased risk for harmful changes (mutations) in the breast cancer genes BRCA1 and BRCA2. Results of the assessment will determine the need for genetic counseling and  BRCA1 and BRCA2 testing.  Routine pelvic exams to screen for cancer are no longer recommended for nonpregnant women who are considered low risk for cancer of the pelvic organs (ovaries, uterus, and vagina) and who do not have symptoms. Ask your health care provider if a screening pelvic exam is right for you.  If you have had past treatment for cervical cancer or a condition that could lead to cancer, you need Pap tests and screening for cancer for at least 20 years after your treatment. If Pap tests have been discontinued, your risk factors (such as having a new sexual partner) need to be reassessed to determine if screening should be resumed. Some women have medical problems that increase the chance of getting cervical cancer. In these cases, your health care provider may recommend more frequent screening and Pap tests.  The HPV test is an additional test that may be used for cervical cancer screening. The HPV test looks for the virus that can cause the cell changes on the cervix. The cells collected during the Pap test can be  tested for HPV. The HPV test could be used to screen women aged 30 years and older, and should be used in women of any age who have unclear Pap test results. After the age of 30, women should have HPV testing at the same frequency as a Pap test.  Colorectal cancer can be detected and often prevented. Most routine colorectal cancer screening begins at the age of 50 years and continues through age 75 years. However, your health care provider may recommend screening at an earlier age if you have risk factors for colon cancer. On a yearly basis, your health care provider may provide home test kits to check for hidden blood in the stool. Use of a small camera at the end of a tube, to directly examine the colon (sigmoidoscopy or colonoscopy), can detect the earliest forms of colorectal cancer. Talk to your health care provider about this at age 50, when routine screening begins. Direct  exam of the colon should be repeated every 5-10 years through age 75 years, unless early forms of pre-cancerous polyps or small growths are found.  People who are at an increased risk for hepatitis B should be screened for this virus. You are considered at high risk for hepatitis B if:  You were born in a country where hepatitis B occurs often. Talk with your health care provider about which countries are considered high risk.  Your parents were born in a high-risk country and you have not received a shot to protect against hepatitis B (hepatitis B vaccine).  You have HIV or AIDS.  You use needles to inject street drugs.  You live with, or have sex with, someone who has hepatitis B.  You get hemodialysis treatment.  You take certain medicines for conditions like cancer, organ transplantation, and autoimmune conditions.  Hepatitis C blood testing is recommended for all people born from 1945 through 1965 and any individual with known risks for hepatitis C.  Practice safe sex. Use condoms and avoid high-risk sexual practices to reduce the spread of sexually transmitted infections (STIs). STIs include gonorrhea, chlamydia, syphilis, trichomonas, herpes, HPV, and human immunodeficiency virus (HIV). Herpes, HIV, and HPV are viral illnesses that have no cure. They can result in disability, cancer, and death.  You should be screened for sexually transmitted illnesses (STIs) including gonorrhea and chlamydia if:  You are sexually active and are younger than 24 years.  You are older than 24 years and your health care provider tells you that you are at risk for this type of infection.  Your sexual activity has changed since you were last screened and you are at an increased risk for chlamydia or gonorrhea. Ask your health care provider if you are at risk.  If you are at risk of being infected with HIV, it is recommended that you take a prescription medicine daily to prevent HIV infection. This is  called preexposure prophylaxis (PrEP). You are considered at risk if:  You are a heterosexual woman, are sexually active, and are at increased risk for HIV infection.  You take drugs by injection.  You are sexually active with a partner who has HIV.  Talk with your health care provider about whether you are at high risk of being infected with HIV. If you choose to begin PrEP, you should first be tested for HIV. You should then be tested every 3 months for as long as you are taking PrEP.  Osteoporosis is a disease in which the bones lose minerals and strength   with aging. This can result in serious bone fractures or breaks. The risk of osteoporosis can be identified using a bone density scan. Women ages 65 years and over and women at risk for fractures or osteoporosis should discuss screening with their health care providers. Ask your health care provider whether you should take a calcium supplement or vitamin D to reduce the rate of osteoporosis.  Menopause can be associated with physical symptoms and risks. Hormone replacement therapy is available to decrease symptoms and risks. You should talk to your health care provider about whether hormone replacement therapy is right for you.  Use sunscreen. Apply sunscreen liberally and repeatedly throughout the day. You should seek shade when your shadow is shorter than you. Protect yourself by wearing long sleeves, pants, a wide-brimmed hat, and sunglasses year round, whenever you are outdoors.  Once a month, do a whole body skin exam, using a mirror to look at the skin on your back. Tell your health care provider of new moles, moles that have irregular borders, moles that are larger than a pencil eraser, or moles that have changed in shape or color.  Stay current with required vaccines (immunizations).  Influenza vaccine. All adults should be immunized every year.  Tetanus, diphtheria, and acellular pertussis (Td, Tdap) vaccine. Pregnant women should  receive 1 dose of Tdap vaccine during each pregnancy. The dose should be obtained regardless of the length of time since the last dose. Immunization is preferred during the 27th-36th week of gestation. An adult who has not previously received Tdap or who does not know her vaccine status should receive 1 dose of Tdap. This initial dose should be followed by tetanus and diphtheria toxoids (Td) booster doses every 10 years. Adults with an unknown or incomplete history of completing a 3-dose immunization series with Td-containing vaccines should begin or complete a primary immunization series including a Tdap dose. Adults should receive a Td booster every 10 years.  Varicella vaccine. An adult without evidence of immunity to varicella should receive 2 doses or a second dose if she has previously received 1 dose. Pregnant females who do not have evidence of immunity should receive the first dose after pregnancy. This first dose should be obtained before leaving the health care facility. The second dose should be obtained 4-8 weeks after the first dose.  Human papillomavirus (HPV) vaccine. Females aged 13-26 years who have not received the vaccine previously should obtain the 3-dose series. The vaccine is not recommended for use in pregnant females. However, pregnancy testing is not needed before receiving a dose. If a female is found to be pregnant after receiving a dose, no treatment is needed. In that case, the remaining doses should be delayed until after the pregnancy. Immunization is recommended for any person with an immunocompromised condition through the age of 26 years if she did not get any or all doses earlier. During the 3-dose series, the second dose should be obtained 4-8 weeks after the first dose. The third dose should be obtained 24 weeks after the first dose and 16 weeks after the second dose.  Zoster vaccine. One dose is recommended for adults aged 60 years or older unless certain conditions are  present.  Measles, mumps, and rubella (MMR) vaccine. Adults born before 1957 generally are considered immune to measles and mumps. Adults born in 1957 or later should have 1 or more doses of MMR vaccine unless there is a contraindication to the vaccine or there is laboratory evidence of immunity to   each of the three diseases. A routine second dose of MMR vaccine should be obtained at least 28 days after the first dose for students attending postsecondary schools, health care workers, or international travelers. People who received inactivated measles vaccine or an unknown type of measles vaccine during 1963-1967 should receive 2 doses of MMR vaccine. People who received inactivated mumps vaccine or an unknown type of mumps vaccine before 1979 and are at high risk for mumps infection should consider immunization with 2 doses of MMR vaccine. For females of childbearing age, rubella immunity should be determined. If there is no evidence of immunity, females who are not pregnant should be vaccinated. If there is no evidence of immunity, females who are pregnant should delay immunization until after pregnancy. Unvaccinated health care workers born before 1957 who lack laboratory evidence of measles, mumps, or rubella immunity or laboratory confirmation of disease should consider measles and mumps immunization with 2 doses of MMR vaccine or rubella immunization with 1 dose of MMR vaccine.  Pneumococcal 13-valent conjugate (PCV13) vaccine. When indicated, a person who is uncertain of her immunization history and has no record of immunization should receive the PCV13 vaccine. An adult aged 19 years or older who has certain medical conditions and has not been previously immunized should receive 1 dose of PCV13 vaccine. This PCV13 should be followed with a dose of pneumococcal polysaccharide (PPSV23) vaccine. The PPSV23 vaccine dose should be obtained at least 8 weeks after the dose of PCV13 vaccine. An adult aged 19  years or older who has certain medical conditions and previously received 1 or more doses of PPSV23 vaccine should receive 1 dose of PCV13. The PCV13 vaccine dose should be obtained 1 or more years after the last PPSV23 vaccine dose.  Pneumococcal polysaccharide (PPSV23) vaccine. When PCV13 is also indicated, PCV13 should be obtained first. All adults aged 65 years and older should be immunized. An adult younger than age 65 years who has certain medical conditions should be immunized. Any person who resides in a nursing home or long-term care facility should be immunized. An adult smoker should be immunized. People with an immunocompromised condition and certain other conditions should receive both PCV13 and PPSV23 vaccines. People with human immunodeficiency virus (HIV) infection should be immunized as soon as possible after diagnosis. Immunization during chemotherapy or radiation therapy should be avoided. Routine use of PPSV23 vaccine is not recommended for American Indians, [place] Natives, or people younger than 65 years unless there are medical conditions that require PPSV23 vaccine. When indicated, people who have unknown immunization and have no record of immunization should receive PPSV23 vaccine. One-time revaccination 5 years after the first dose of PPSV23 is recommended for people aged 19-64 years who have chronic kidney failure, nephrotic syndrome, asplenia, or immunocompromised conditions. People who received 1-2 doses of PPSV23 before age 65 years should receive another dose of PPSV23 vaccine at age 65 years or later if at least 5 years have passed since the previous dose. Doses of PPSV23 are not needed for people immunized with PPSV23 at or after age 65 years.  Meningococcal vaccine. Adults with asplenia or persistent complement component deficiencies should receive 2 doses of quadrivalent meningococcal conjugate (MenACWY-D) vaccine. The doses should be obtained at least 2 months apart.  Microbiologists working with certain meningococcal bacteria, military recruits, people at risk during an outbreak, and people who travel to or live in countries with a high rate of meningitis should be immunized. A first-year college student up through age   21 years who is living in a residence hall should receive a dose if she did not receive a dose on or after her 16th birthday. Adults who have certain high-risk conditions should receive one or more doses of vaccine.  Hepatitis A vaccine. Adults who wish to be protected from this disease, have certain high-risk conditions, work with hepatitis A-infected animals, work in hepatitis A research labs, or travel to or work in countries with a high rate of hepatitis A should be immunized. Adults who were previously unvaccinated and who anticipate close contact with an international adoptee during the first 60 days after arrival in the [place] from a country with a high rate of hepatitis A should be immunized.  Hepatitis B vaccine. Adults who wish to be protected from this disease, have certain high-risk conditions, may be exposed to blood or other infectious body fluids, are household contacts or sex partners of hepatitis B positive people, are clients or workers in certain care facilities, or travel to or work in countries with a high rate of hepatitis B should be immunized.  Haemophilus influenzae type b (Hib) vaccine. A previously unvaccinated person with asplenia or sickle cell disease or having a scheduled splenectomy should receive 1 dose of Hib vaccine. Regardless of previous immunization, a recipient of a hematopoietic stem cell transplant should receive a 3-dose series 6-12 months after her successful transplant. Hib vaccine is not recommended for adults with HIV infection. Preventive Services / Frequency Ages 64 to 68 years  Blood pressure check.** / Every 1 to 2 years.  Lipid and cholesterol check.** / Every 5 years beginning at age  22.  Clinical breast exam.** / Every 3 years for women in their 88s and 53s.  BRCA-related cancer risk assessment.** / For women who have family members with a BRCA-related cancer (breast, ovarian, tubal, or peritoneal cancers).  Pap test.** / Every 2 years from ages 90 through 51. Every 3 years starting at age 21 through age 56 or 3 with a history of 3 consecutive normal Pap tests.  HPV screening.** / Every 3 years from ages 24 through ages 1 to 46 with a history of 3 consecutive normal Pap tests.  Hepatitis C blood test.** / For any individual with known risks for hepatitis C.  Skin self-exam. / Monthly.  Influenza vaccine. / Every year.  Tetanus, diphtheria, and acellular pertussis (Tdap, Td) vaccine.** / Consult your health care provider. Pregnant women should receive 1 dose of Tdap vaccine during each pregnancy. 1 dose of Td every 10 years.  Varicella vaccine.** / Consult your health care provider. Pregnant females who do not have evidence of immunity should receive the first dose after pregnancy.  HPV vaccine. / 3 doses over 6 months, if 72 and younger. The vaccine is not recommended for use in pregnant females. However, pregnancy testing is not needed before receiving a dose.  Measles, mumps, rubella (MMR) vaccine.** / You need at least 1 dose of MMR if you were born in 1957 or later. You may also need a 2nd dose. For females of childbearing age, rubella immunity should be determined. If there is no evidence of immunity, females who are not pregnant should be vaccinated. If there is no evidence of immunity, females who are pregnant should delay immunization until after pregnancy.  Pneumococcal 13-valent conjugate (PCV13) vaccine.** / Consult your health care provider.  Pneumococcal polysaccharide (PPSV23) vaccine.** / 1 to 2 doses if you smoke cigarettes or if you have certain conditions.  Meningococcal vaccine.** /  1 dose if you are age 19 to 21 years and a first-year college  student living in a residence hall, or have one of several medical conditions, you need to get vaccinated against meningococcal disease. You may also need additional booster doses.  Hepatitis A vaccine.** / Consult your health care provider.  Hepatitis B vaccine.** / Consult your health care provider.  Haemophilus influenzae type b (Hib) vaccine.** / Consult your health care provider. Ages 40 to 64 years  Blood pressure check.** / Every 1 to 2 years.  Lipid and cholesterol check.** / Every 5 years beginning at age 20 years.  Lung cancer screening. / Every year if you are aged 55-80 years and have a 30-pack-year history of smoking and currently smoke or have quit within the past 15 years. Yearly screening is stopped once you have quit smoking for at least 15 years or develop a health problem that would prevent you from having lung cancer treatment.  Clinical breast exam.** / Every year after age 40 years.  BRCA-related cancer risk assessment.** / For women who have family members with a BRCA-related cancer (breast, ovarian, tubal, or peritoneal cancers).  Mammogram.** / Every year beginning at age 40 years and continuing for as long as you are in good health. Consult with your health care provider.  Pap test.** / Every 3 years starting at age 30 years through age 65 or 70 years with a history of 3 consecutive normal Pap tests.  HPV screening.** / Every 3 years from ages 30 years through ages 65 to 70 years with a history of 3 consecutive normal Pap tests.  Fecal occult blood test (FOBT) of stool. / Every year beginning at age 50 years and continuing until age 75 years. You may not need to do this test if you get a colonoscopy every 10 years.  Flexible sigmoidoscopy or colonoscopy.** / Every 5 years for a flexible sigmoidoscopy or every 10 years for a colonoscopy beginning at age 50 years and continuing until age 75 years.  Hepatitis C blood test.** / For all people born from 1945 through  1965 and any individual with known risks for hepatitis C.  Skin self-exam. / Monthly.  Influenza vaccine. / Every year.  Tetanus, diphtheria, and acellular pertussis (Tdap/Td) vaccine.** / Consult your health care provider. Pregnant women should receive 1 dose of Tdap vaccine during each pregnancy. 1 dose of Td every 10 years.  Varicella vaccine.** / Consult your health care provider. Pregnant females who do not have evidence of immunity should receive the first dose after pregnancy.  Zoster vaccine.** / 1 dose for adults aged 60 years or older.  Measles, mumps, rubella (MMR) vaccine.** / You need at least 1 dose of MMR if you were born in 1957 or later. You may also need a 2nd dose. For females of childbearing age, rubella immunity should be determined. If there is no evidence of immunity, females who are not pregnant should be vaccinated. If there is no evidence of immunity, females who are pregnant should delay immunization until after pregnancy.  Pneumococcal 13-valent conjugate (PCV13) vaccine.** / Consult your health care provider.  Pneumococcal polysaccharide (PPSV23) vaccine.** / 1 to 2 doses if you smoke cigarettes or if you have certain conditions.  Meningococcal vaccine.** / Consult your health care provider.  Hepatitis A vaccine.** / Consult your health care provider.  Hepatitis B vaccine.** / Consult your health care provider.  Haemophilus influenzae type b (Hib) vaccine.** / Consult your health care provider. Ages 65   years and over  Blood pressure check.** / Every 1 to 2 years.  Lipid and cholesterol check.** / Every 5 years beginning at age 22 years.  Lung cancer screening. / Every year if you are aged 73-80 years and have a 30-pack-year history of smoking and currently smoke or have quit within the past 15 years. Yearly screening is stopped once you have quit smoking for at least 15 years or develop a health problem that would prevent you from having lung cancer  treatment.  Clinical breast exam.** / Every year after age 4 years.  BRCA-related cancer risk assessment.** / For women who have family members with a BRCA-related cancer (breast, ovarian, tubal, or peritoneal cancers).  Mammogram.** / Every year beginning at age 40 years and continuing for as long as you are in good health. Consult with your health care provider.  Pap test.** / Every 3 years starting at age 9 years through age 34 or 91 years with 3 consecutive normal Pap tests. Testing can be stopped between 65 and 70 years with 3 consecutive normal Pap tests and no abnormal Pap or HPV tests in the past 10 years.  HPV screening.** / Every 3 years from ages 57 years through ages 64 or 45 years with a history of 3 consecutive normal Pap tests. Testing can be stopped between 65 and 70 years with 3 consecutive normal Pap tests and no abnormal Pap or HPV tests in the past 10 years.  Fecal occult blood test (FOBT) of stool. / Every year beginning at age 15 years and continuing until age 17 years. You may not need to do this test if you get a colonoscopy every 10 years.  Flexible sigmoidoscopy or colonoscopy.** / Every 5 years for a flexible sigmoidoscopy or every 10 years for a colonoscopy beginning at age 86 years and continuing until age 71 years.  Hepatitis C blood test.** / For all people born from [birth year] through 1963 and any individual with known risks for hepatitis C.  Osteoporosis screening.** / A one-time screening for women ages 83 years and over and women at risk for fractures or osteoporosis.  Skin self-exam. / Monthly.  Influenza vaccine. / Every year.  Tetanus, diphtheria, and acellular pertussis (Tdap/Td) vaccine.** / 1 dose of Td every 10 years.  Varicella vaccine.** / Consult your health care provider.  Zoster vaccine.** / 1 dose for adults aged 61 years or older.  Pneumococcal 13-valent conjugate (PCV13) vaccine.** / Consult your health care provider.  Pneumococcal  polysaccharide (PPSV23) vaccine.** / 1 dose for all adults aged 28 years and older.  Meningococcal vaccine.** / Consult your health care provider.  Hepatitis A vaccine.** / Consult your health care provider.  Hepatitis B vaccine.** / Consult your health care provider.  Haemophilus influenzae type b (Hib) vaccine.** / Consult your health care provider. ** Family history and personal history of risk and conditions may change your health care provider's recommendations. Document Released: 12/16/2001 Document Revised: 03/06/2014 Document Reviewed: 03/17/2011 [facility] Patient Information 2015 [place], [place]. This information is not intended to replace advice given to you by your health care provider. Make sure you discuss any questions you have with your health care provider.

## 2015-02-22 NOTE — Progress Notes (Signed)
Subjective:     Alicia Knapp is a 59 y.o. female and is here for a comprehensive physical exam. The patient reports problems - vertigo is better.  History   Social History  . Marital Status: Married    Spouse Name: N/A  . Number of Children: N/A  . Years of Education: N/A   Occupational History  . Not on file.   Social History Main Topics  . Smoking status: Former Smoker    Quit date: 07/01/2014  . Smokeless tobacco: Never Used  . Alcohol Use: No  . Drug Use: No  . Sexual Activity:    Partners: Male   Other Topics Concern  . Not on file   Social History Narrative   Health Maintenance  Topic Date Due  . PAP SMEAR  12/20/1974  . COLONOSCOPY  12/20/2006  . MAMMOGRAM  10/11/2009  . INFLUENZA VACCINE  01/01/2016 (Originally 06/04/2015)  . HIV Screening  02/22/2016 (Originally 12/21/1971)  . TETANUS/TDAP  11/28/2015    The following portions of the patient's history were reviewed and updated as appropriate:  She  has a past medical history of Anxiety and Hypertension. She  does not have any pertinent problems on file. She  has past surgical history that includes Abdominal hysterectomy. Her family history includes Arthritis in her maternal grandmother; Cancer in her father; Diabetes in her mother; Hypertension in her brother and sister; Stroke in her brother; Thyroid disease in her mother. She  reports that she quit smoking about 7 months ago. She has never used smokeless tobacco. She reports that she does not drink alcohol or use illicit drugs. She has a current medication list which includes the following prescription(s): amlodipine, aripiprazole, aspirin, carvedilol, cholecalciferol, fexofenadine, fluticasone, hydrochlorothiazide, lorazepam, fish oil, valsartan, and potassium chloride sa. Current Outpatient Prescriptions on File Prior to Visit  Medication Sig Dispense Refill  . amLODipine (NORVASC) 10 MG tablet Take 1 tablet (10 mg total) by mouth daily. 90 tablet 1  .  ARIPiprazole (ABILIFY) 10 MG tablet Take 10 mg by mouth daily.      [name] aspirin 81 MG tablet Take 81 mg by mouth daily.      . carvedilol (COREG) 6.25 MG tablet TAKE 1 TABLET TWICE A DAY WITH MEALS 180 tablet 1  . cholecalciferol (VITAMIN D) 1000 UNITS tablet Take 1,000 Units by mouth daily.    . fexofenadine (ALLEGRA) 180 MG tablet Take 1 tablet (180 mg total) by mouth daily. 30 tablet 11  . fluticasone (FLONASE) 50 MCG/ACT nasal spray Place 2 sprays into both nostrils daily. 48 g 2  . hydrochlorothiazide (HYDRODIURIL) 25 MG tablet Take 1 tablet (25 mg total) by mouth daily. 90 tablet 1  . LORazepam (ATIVAN) 1 MG tablet Take 1 mg by mouth every 8 (eight) hours.      . Omega-3 Fatty Acids (FISH OIL) 1000 MG CAPS Take by mouth 2 (two) times daily. Take two     . potassium chloride SA (K-DUR,KLOR-CON) 20 MEQ tablet Take 1 tablet (20 mEq total) by mouth daily. (Patient not taking: Reported on 02/22/2015) 30 tablet 2   No current facility-administered medications on file prior to visit.   She is allergic to erythromycin and penicillins..  Review of Systems Review of Systems  Constitutional: Negative for activity change, appetite change and fatigue.  HENT: Negative for hearing loss, congestion, tinnitus and ear discharge.  dentist q68m Eyes: Negative for visual disturbance (see optho --due) Respiratory: Negative for cough, chest tightness and shortness of breath.  Cardiovascular: Negative for chest pain, palpitations and leg swelling.  Gastrointestinal: Negative for abdominal pain, diarrhea, constipation and abdominal distention.  Genitourinary: Negative for urgency, frequency, decreased urine volume and difficulty urinating.  Musculoskeletal: Negative for back pain, arthralgias and gait problem.  Skin: Negative for color change, pallor and rash.  Neurological: Negative for dizziness, light-headedness, numbness and headaches.  Hematological: Negative for adenopathy. Does not bruise/bleed easily.   Psychiatric/Behavioral: Negative for suicidal ideas, confusion, sleep disturbance, self-injury, dysphoric mood, decreased concentration and agitation.       Objective:    BP 140/82 mmHg  Pulse 56  Temp(Src) 98 F (36.7 C) (Oral)  Ht 5\' 6"  (1.676 m)  Wt 209 lb (94.802 kg)  BMI 33.75 kg/m2  SpO2 99% General appearance: alert, cooperative, appears stated age and no distress Head: Normocephalic, without obvious abnormality, atraumatic Eyes: conjunctivae/corneas clear. PERRL, EOM's intact. Fundi benign. Ears: b/l cerumen Nose: Nares normal. Septum midline. Mucosa normal. No drainage or sinus tenderness. Throat: lips, mucosa, and tongue normal; teeth and gums normal Neck: no adenopathy, no carotid bruit, no JVD, supple, symmetrical, trachea midline and thyroid not enlarged, symmetric, no tenderness/mass/nodules Back: symmetric, no curvature. ROM normal. No CVA tenderness. Lungs: clear to auscultation bilaterally Breasts: normal appearance, no masses or tenderness Heart: S1, S2 normal Abdomen: soft, non-tender; bowel sounds normal; no masses,  no organomegaly Pelvic: cervix normal in appearance, external genitalia normal, no adnexal masses or tenderness, no cervical motion tenderness, rectovaginal septum normal, uterus normal size, shape, and consistency, vagina normal without discharge and pap done, rectal heme neg brown stool Extremities: extremities normal, atraumatic, no cyanosis or edema Pulses: 2+ and symmetric Skin: Skin color, texture, turgor normal. No rashes or lesions Lymph nodes: Cervical, supraclavicular, and axillary nodes normal. Neurologic: Alert and oriented X 3, normal strength and tone. Normal symmetric reflexes. Normal coordination and gait Psych--no depression, no anxiety      Assessment:    Healthy female exam.     Plan:     ghm utd Check labs See After Visit Summary for Counseling Recommendations    1. Benign paroxysmal positional vertigo, unspecified  laterality Pt never heard about PT from neuro-- will put referral in  - Ambulatory referral to Physical Therapy  2. Preventative health care   - Ambulatory referral to Gastroenterology - Basic metabolic panel - CBC with Differential/Platelet - Hepatic function panel - Lipid panel - POCT urinalysis dipstick - Microalbumin / creatinine urine ratio - TSH  3. Screening for cervical cancer   - Cytology - PAP

## 2015-02-22 NOTE — Progress Notes (Signed)
Pre visit review using our clinic review tool, if applicable. No additional management support is needed unless otherwise documented below in the visit note. 

## 2015-02-23 LAB — CYTOLOGY - PAP

## 2015-03-05 ENCOUNTER — Telehealth: Payer: Self-pay | Admitting: Family Medicine

## 2015-03-05 MED ORDER — ATORVASTATIN CALCIUM 10 MG PO TABS: 10.0000 mg | ORAL_TABLET | Freq: Every day | ORAL | Status: DC

## 2015-03-05 NOTE — Telephone Encounter (Signed)
Pap normal Cholesterol--- LDL goal < 100, HDL >40, TG < 150. Diet and exercise will increase HDL and decrease LDL and TG. Fish, Fish Oil, Flaxseed oil will also help increase the HDL and decrease Triglycerides.  Recheck labs in 3 mon---- lipid, hep,b mp---hyperlipidemia.----start lipitor 10 mg #30 1 each night, 2 refills

## 2015-03-05 NOTE — Telephone Encounter (Signed)
Caller Name: Emma Birchler Relation to Patient: self Phone #: [phone] Pharmacy:  Reason for Call: pt returning your call. She believes it was regarding lab results.

## 2015-03-05 NOTE — Telephone Encounter (Signed)
Discussed with the patient who verbalized understanding, she has agreed to start the Lipitor. The medication has been [facility] and a copy of the labs have been mailed.      KP

## 2015-03-06 ENCOUNTER — Ambulatory Visit: Attending: Family Medicine | Admitting: Physical Therapy

## 2015-03-06 DIAGNOSIS — R42 Dizziness and giddiness: Secondary | ICD-10-CM

## 2015-03-06 NOTE — Patient Instructions (Signed)
Sit to Side-Lying   Sit on edge of bed. 1. Turn head 45 to right. 2. Maintain head position and lie down slowly on left side. Hold for 30 seconds. 3. Sit up slowly. Hold until symptoms subside plus 30 seconds. 4. Turn head 45 to left. 5. Maintain head position and lie down slowly on right side. Hold 30 seconds. 6. Sit up slowly.  Hold until symptoms subside plus 30 seconds. Repeat sequence __3-5__ times per session. Do _2-3___ sessions per day.  Copyright  VHI. All rights reserved.   Laureen Abrahams, PT, DPT 03/06/2015 3:15 PM  [facility] at [facility] [address] [place], [place] [address]  [phone] (office) [phone] (fax)

## 2015-03-06 NOTE — Therapy (Signed)
[facility] [address]  [place], [place], [address] Phone: [phone]   Fax:  [phone]  Physical Therapy Evaluation  Patient Details  Name: Alicia Knapp MRN: [id number] Date of Birth: 12/20/1954 Referring Provider:  Rosalita Chessman, DO  Encounter Date: 03/06/2015      PT End of Session - 03/06/15 1534    Visit Number 1   Number of Visits 4   Date for PT Re-Evaluation 04/03/15   Activity Tolerance Patient tolerated treatment well   Behavior During Therapy [facility] for tasks assessed/performed;Anxious      Past Medical History  Diagnosis Date  . Anxiety   . Hypertension     Past Surgical History  Procedure Laterality Date  . Abdominal hysterectomy      There were no vitals filed for this visit.  Visit Diagnosis:  Dizziness and giddiness - Plan: PT plan of care cert/re-cert      Subjective Assessment - 03/06/15 1442    Subjective Pt is a 59 y/o female who presents to OPPT with sudden onset of vertigo described as "room spinning."  Pt reports symptoms stopped within seconds.  Pt also expresses some lightheadedness.  Pt reports symptoms began about 2 months ago.   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 No/denies  reports occasional headache with symptoms            [facility] PT Assessment - 03/06/15 1445    Assessment   Medical Diagnosis BPPV   Onset Date 01/02/15  approx   Next MD Visit PRN   Prior Therapy n/a   Precautions   Precautions None   Restrictions   Weight Bearing Restrictions No   Balance Screen   Has the patient fallen in the past 6 months Yes   How many times? 1 (about 6 weeks ago)   Has the patient had a decrease in activity level because of a fear of falling?  No   Is the patient reluctant to leave their home because of a fear of falling?  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/wk   Cognition   Overall Cognitive Status Within Functional Limits for tasks assessed   Observation/Other Assessments   Focus on Therapeutic Outcomes (FOTO)  69 (31% limited; predicted 19% limited)            Vestibular Assessment - 03/06/15 1450    Symptom Behavior   Type of Dizziness Spinning   Frequency of Dizziness with rolling in bed; getting up out of bed   Duration of Dizziness seconds   Aggravating Factors Supine to sit;Turning head quickly   Relieving Factors Rest   Occulomotor Exam   Smooth Pursuits Intact   Saccades Intact   Comment head thrust WNL; increased symptoms to R   Vestibulo-Occular Reflex   VOR 1 Head Only (x 1 viewing) WNL   Positional Testing   Dix-Hallpike Dix-Hallpike Right;Dix-Hallpike Left   Sidelying Test Sidelying Right;Sidelying Left   Horizontal Canal Testing Horizontal Canal Right;Horizontal Canal Left   Dix-Hallpike Right   Dix-Hallpike Right Symptoms No nystagmus   Dix-Hallpike Left   Dix-Hallpike Left Duration delayed onset of lightheadedness   Dix-Hallpike Left Symptoms No nystagmus   Sidelying Right  Sidelying Right Duration c/o lightheadedness; especially with return to sit   Sidelying Right Symptoms No nystagmus   Sidelying Left   Sidelying Left Symptoms No nystagmus   Horizontal Canal Right   Horizontal Canal Right Symptoms Normal   Horizontal Canal Left   Horizontal Canal Left Symptoms Normal   Cognition   Cognition Orientation Level Appropriate for developmental age   Positional Sensitivities   Sit to Supine No dizziness   Supine to Left Side No dizziness   Supine to Right Side Lightheadedness   Supine to Sitting Lightheadedness   Right Hallpike No dizziness   Up from Right Hallpike Lightheadedness   Up from Left Hallpike Lightheadedness                 Vestibular Treatment/Exercise - Mar 19, 2015 1533    Vestibular Treatment/Exercise   Vestibular Treatment Provided Habituation   Habituation Exercises Legrand Como Daroff   Number of Reps  1   Symptom Description  n/a; educated on performing 3-5 reps for home for symptom management               PT Education - Mar 19, 2015 1534    Education provided Yes   Education Details clinical findings; POC, goals of care   Person(s) Educated Patient   Methods Explanation   Comprehension Verbalized understanding             PT Long Term Goals - 19-Mar-2015 1540    PT LONG TERM GOAL #1   Title independent with HEP (04/03/15)   Time 4   Period Weeks   Status New   PT LONG TERM GOAL #2   Title FOTO improved by 12 points for improved function (04/03/15)   Time 4   Period Weeks   Status New               Plan - 03-19-15 1535    Clinical Impression Statement Pt presents to OPPT with residual symptoms of vertigo, mostly c/o lightheadedness at this time.  Pt denies any spinning x 6 weeks.  Pt's initial symptoms reported seem conisistent with BPPV however appears this has resolved.  Pt may have some residual vestibular symptoms or symptoms may be associated with known vertebral artery occlusion.  Will plan to follow x 3-4 weeks to see if habituation able to improve symptoms.   Pt will benefit from skilled therapeutic intervention in order to improve on the following deficits Other (comment);Decreased balance  vertigo   Rehab Potential Good   PT Frequency 1x / week   PT Duration 4 weeks   PT Treatment/Interventions ADLs/Self Care Home Management;Balance training;Therapeutic exercise;Therapeutic activities;Patient/family education;Functional mobility training;Neuromuscular re-education   PT Next Visit Plan assess HEP; reassess as needed.  Continue vestibular exercises as pt can tolerate.   Consulted and Agree with Plan of Care Patient          G-Codes  - Mar 19, 2015 1541    Functional Assessment Tool Used FOTO 31% limited   Functional Limitation Changing and maintaining body position   Changing and Maintaining Body Position Current Status [phone]) At least 20 percent but less than 40 percent impaired, limited or restricted   Changing and Maintaining Body Position Goal Status (F0263) At least 1 percent but less than 20 percent impaired, limited or restricted       Problem List Patient Active Problem List   Diagnosis Date Noted  . OTHER ACUTE SINUSITIS 08/26/2010  . HYPERLIPIDEMIA 07/26/2010  . ALLERGIC RHINITIS DUE TO POLLEN 04/16/2009  .  ANXIETY 01/04/2008  . HYPERTENSION 01/04/2008  . CARDIAC MURMUR 01/04/2008   Laureen Abrahams, PT, DPT 03/06/2015 3:44 PM  [facility] [address]  [address] [place], [place], [address] Phone: [phone]   Fax:  [phone]

## 2015-03-09 ENCOUNTER — Encounter: Payer: Self-pay | Admitting: Internal Medicine

## 2015-03-13 ENCOUNTER — Ambulatory Visit: Admitting: Physical Therapy

## 2015-03-13 DIAGNOSIS — R42 Dizziness and giddiness: Secondary | ICD-10-CM

## 2015-03-13 NOTE — Therapy (Signed)
[facility] [address]  [place], [place], [address] Phone: [phone]   Fax:  [phone]  Physical Therapy Treatment  Patient Details  Name: Alicia Knapp MRN: [id number] Date of Birth: 12/20/1954 Referring Provider:  Rosalita Chessman, DO  Encounter Date: 21-Mar-2015      PT End of Session - 2015/03/21 1553    Visit Number 2   PT Start Time 3428   PT Stop Time 1547   PT Time Calculation (min) 16 min   Activity Tolerance Patient tolerated treatment well   Behavior During Therapy [facility] for tasks assessed/performed;Anxious      Past Medical History  Diagnosis Date  . Anxiety   . Hypertension     Past Surgical History  Procedure Laterality Date  . Abdominal hysterectomy      There were no vitals filed for this visit.  Visit Diagnosis:  Dizziness and giddiness      Subjective Assessment - 2015/03/21 1533    Subjective Everything is going well; no symptoms x 1 week.  Did exercises a couple times.   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 No/denies                Vestibular Assessment - 03/21/2015 1537    Sidelying Right   Sidelying Right Duration no symptoms   Sidelying Right Symptoms No nystagmus   Sidelying Left   Sidelying Left Duration no symptoms   Sidelying Left Symptoms No nystagmus                         PT Education - Mar 21, 2015 1552    Education provided Yes   Education Details negative vestibular assessment; educated on avoiding cervical extension to reduce risk of decreased blood flow with known vertebral artery occlusion   Person(s) Educated Patient;Spouse   Methods Explanation   Comprehension Verbalized understanding             PT Long Term Goals - 03/21/2015 1554    PT LONG TERM GOAL #1   Title independent with HEP (04/03/15)   Status Achieved   PT LONG TERM GOAL #2   Title FOTO  improved by 12 points for improved function (04/03/15)   Status Partially Met  improved 11 points               Plan - 03-21-15 1553    Clinical Impression Statement Pt without reports of vertigo or lightheadedness since last session.  At this time pt ready for d/c as no futher symptoms persist.   PT Next Visit Plan d/c PT today   Consulted and Agree with Plan of Care Patient          G-Codes - 03/21/15 1555    Functional Assessment Tool Used FOTO 20% limited   Functional Limitation Changing and maintaining body position   Changing and Maintaining Body Position Goal Status (J6811) At least 1 percent but less than 20 percent impaired, limited or restricted   Changing and Maintaining Body Position Discharge Status (X7262) At least 1 percent but less than 20 percent impaired, limited or restricted      Problem List Patient Active Problem List   Diagnosis Date Noted  . OTHER ACUTE SINUSITIS 08/26/2010  . HYPERLIPIDEMIA 07/26/2010  . ALLERGIC RHINITIS DUE TO POLLEN 04/16/2009  . ANXIETY 01/04/2008  . HYPERTENSION 01/04/2008  . CARDIAC MURMUR 01/04/2008  Laureen Abrahams, PT, DPT 03/13/2015 3:56 PM  [facility] [address]  [place], [place], [address] Phone: [phone]   Fax:  [phone]      PHYSICAL THERAPY DISCHARGE SUMMARY  Visits from Start of Care: 2  Current functional level related to goals / functional outcomes: See above   Remaining deficits: Known vertebral artery occlusion; educated on avoiding cervical extension   Education / Equipment: HEP  Plan: Patient agrees to discharge.  Patient goals were met. Patient is being discharged due to meeting the stated rehab goals.  ?????    Laureen Abrahams, PT, DPT 03/13/2015 3:57 PM  [facility] at [facility] [address] [place], [place] [address]  [phone]  (office) [phone] (fax)

## 2015-03-20 ENCOUNTER — Ambulatory Visit

## 2015-05-08 ENCOUNTER — Ambulatory Visit (AMBULATORY_SURGERY_CENTER): Payer: Self-pay

## 2015-05-08 VITALS — Ht 66.0 in | Wt 206.4 lb

## 2015-05-08 DIAGNOSIS — Z1211 Encounter for screening for malignant neoplasm of colon: Secondary | ICD-10-CM

## 2015-05-08 MED ORDER — NA SULFATE-K SULFATE-MG SULF 17.5-3.13-1.6 GM/177ML PO SOLN: ORAL | Status: DC

## 2015-05-08 NOTE — Progress Notes (Signed)
Per pt, no allergies to soy or egg products.Pt not taking any weight loss meds or using  O2 at home. 

## 2015-05-22 ENCOUNTER — Ambulatory Visit (AMBULATORY_SURGERY_CENTER): Admitting: Internal Medicine

## 2015-05-22 ENCOUNTER — Encounter: Payer: Self-pay | Admitting: Internal Medicine

## 2015-05-22 VITALS — BP 136/60 | HR 51 | Temp 96.8°F | Resp 20 | Ht 66.0 in | Wt 206.0 lb

## 2015-05-22 DIAGNOSIS — Z1211 Encounter for screening for malignant neoplasm of colon: Secondary | ICD-10-CM

## 2015-05-22 DIAGNOSIS — D125 Benign neoplasm of sigmoid colon: Secondary | ICD-10-CM | POA: Diagnosis not present

## 2015-05-22 DIAGNOSIS — K635 Polyp of colon: Secondary | ICD-10-CM | POA: Diagnosis not present

## 2015-05-22 DIAGNOSIS — D127 Benign neoplasm of rectosigmoid junction: Secondary | ICD-10-CM

## 2015-05-22 DIAGNOSIS — D123 Benign neoplasm of transverse colon: Secondary | ICD-10-CM

## 2015-05-22 HISTORY — PX: COLONOSCOPY: SHX174

## 2015-05-22 MED ORDER — SODIUM CHLORIDE 0.9 % IV SOLN: 500.0000 mL | INTRAVENOUS | Status: DC

## 2015-05-22 NOTE — Op Note (Signed)
[place]  [employer] [place], [id number]   COLONOSCOPY PROCEDURE REPORT  PATIENT: Kiva, Norland  MR#: [id number] BIRTHDATE: 12/20/1954 , 60  yrs. old GENDER: female ENDOSCOPIST: Jerene Bears, MD REFERRED OV:ZCHYIF Colon, DO PROCEDURE DATE:  05/22/2015 PROCEDURE:   Colonoscopy, screening and Colonoscopy with snare polypectomy First Screening Colonoscopy - Avg.  risk and is 50 yrs.  old or older Yes.  Prior Negative Screening - Now for repeat screening. N/A  History of Adenoma - Now for follow-up colonoscopy & has been > or = to 3 yrs.  N/A  Polyps removed today? Yes ASA CLASS:   Class II INDICATIONS:Screening for colonic neoplasia and Colorectal Neoplasm Risk Assessment for this procedure is average risk. MEDICATIONS: Monitored anesthesia care and Propofol 450 mg IV  DESCRIPTION OF PROCEDURE:   After the risks benefits and alternatives of the procedure were thoroughly explained, informed consent was obtained.  The digital rectal exam revealed several skin tags.   The LB [id number]  endoscope was introduced through the anus and advanced to the cecum, which was identified by both the appendix and ileocecal valve. No adverse events experienced.   The quality of the prep was fair.  (Suprep was used) The instrument was then slowly withdrawn as the colon was fully examined. Estimated blood loss is zero unless otherwise noted in this procedure report.    COLON FINDINGS: Four sessile polyps ranging between 3-64mm in size were found at the hepatic flexure.  Polypectomies were performed with a cold snare.  The resection was complete, the polyp tissue was completely retrieved and sent to histology.   Three sessile polyps ranging between 3-67mm in size were found in the transverse colon.  Polypectomies were performed with a cold snare.  The resection was complete, the polyp tissue was completely retrieved and sent to histology.   Three sessile polyps ranging  between 3-4mm in size were found in the sigmoid colon and rectosigmoid colon. Polypectomies were performed with a cold snare.  The resection was complete, the polyp tissue was completely retrieved and sent to histology.   There was moderate diverticulosis noted in the ascending colon, descending colon, and sigmoid colon.  Retroflexed views revealed internal hemorrhoids. The time to cecum = 3.5 Withdrawal time = 24.3   The scope was withdrawn and the procedure completed.  COMPLICATIONS: There were no immediate complications.  ENDOSCOPIC IMPRESSION: 1.   Four sessile polyps ranging between 3-32mm in size were found at the hepatic flexure; polypectomies were performed with a cold snare  2.   Three sessile polyps ranging between 3-10mm in size were found in the transverse colon; polypectomies were performed with a cold snare 3.   Three sessile polyps ranging between 3-1mm in size were found in the sigmoid colon and rectosigmoid colon; polypectomies were performed with a cold snare 4.   Moderate diverticulosis was noted in the ascending colon, descending colon, and sigmoid colon  RECOMMENDATIONS: 1.  Await pathology results 2.  High fiber diet 3.  Timing of repeat colonoscopy will be determined by pathology findings. 4.  You will receive a letter within 1-2 weeks with the results of your biopsy as well as final recommendations.  Please call my office if you have not received a letter after 3 weeks.  eSigned:  Jerene Bears, MD 05/22/2015 9:30 AM   cc: Rosalita Chessman, DO and The Patient   PATIENT NAME:  Lynesha, Bango MR#: [id number]

## 2015-05-22 NOTE — Progress Notes (Signed)
Called to room to assist during endoscopic procedure.  Patient ID and intended procedure confirmed with present staff. Received instructions for my participation in the procedure from the performing physician.  

## 2015-05-22 NOTE — Progress Notes (Signed)
Transferred to recovery room. A/O x3, pleased with MAC.  VSS.  Report to Shelia, RN. 

## 2015-05-22 NOTE — Patient Instructions (Signed)
YOU HAD AN ENDOSCOPIC PROCEDURE TODAY AT [facility]:   Refer to the procedure report that was given to you for any specific questions about what was found during the examination.  If the procedure report does not answer your questions, please call your gastroenterologist to clarify.  If you requested that your care partner not be given the details of your procedure findings, then the procedure report has been included in a sealed envelope for you to review at your convenience later.  YOU SHOULD EXPECT: Some feelings of bloating in the abdomen. Passage of more gas than usual.  Walking can help get rid of the air that was put into your GI tract during the procedure and reduce the bloating. If you had a lower endoscopy (such as a colonoscopy or flexible sigmoidoscopy) you may notice spotting of blood in your stool or on the toilet paper. If you underwent a bowel prep for your procedure, you may not have a normal bowel movement for a few days.  Please Note:  You might notice some irritation and congestion in your nose or some drainage.  This is from the oxygen used during your procedure.  There is no need for concern and it should clear up in a day or so.  SYMPTOMS TO REPORT IMMEDIATELY:   Following lower endoscopy (colonoscopy or flexible sigmoidoscopy):  Excessive amounts of blood in the stool  Significant tenderness or worsening of abdominal pains  Swelling of the abdomen that is new, acute  Fever of 100F or higher    For urgent or emergent issues, a gastroenterologist can be reached at any hour by calling [phone].   DIET: Your first meal following the procedure should be a small meal and then it is ok to progress to your normal diet. Heavy or fried foods are harder to digest and may make you feel nauseous or bloated.  Likewise, meals heavy in dairy and vegetables can increase bloating.  Drink plenty of fluids but you should avoid alcoholic beverages for 24  hours.  ACTIVITY:  You should plan to take it easy for the rest of today and you should NOT DRIVE or use heavy machinery until tomorrow (because of the sedation medicines used during the test).    FOLLOW UP: Our staff will call the number listed on your records the next business day following your procedure to check on you and address any questions or concerns that you may have regarding the information given to you following your procedure. If we do not reach you, we will leave a message.  However, if you are feeling well and you are not experiencing any problems, there is no need to return our call.  We will assume that you have returned to your regular daily activities without incident.  If any biopsies were taken you will be contacted by phone or by letter within the next 1-3 weeks.  Please call us at [phone] if you have not heard about the biopsies in 3 weeks.    SIGNATURES/CONFIDENTIALITY: You and/or your care partner have signed paperwork which will be entered into your electronic medical record.  These signatures attest to the fact that that the information above on your After Visit Summary has been reviewed and is understood.  Full responsibility of the confidentiality of this discharge information lies with you and/or your care-partner.    RESUME MEDICATIONS. INFORMATION GIVEN ON POLYPS, DIVERTICULOSIS AND HIGH FIBER DIET .

## 2015-05-23 ENCOUNTER — Telehealth: Payer: Self-pay | Admitting: *Deleted

## 2015-05-23 NOTE — Telephone Encounter (Signed)
No answer, message left for the patient. 

## 2015-05-29 ENCOUNTER — Encounter: Payer: Self-pay | Admitting: Internal Medicine

## 2015-07-11 ENCOUNTER — Other Ambulatory Visit: Payer: Self-pay | Admitting: Family Medicine

## 2015-08-13 ENCOUNTER — Other Ambulatory Visit: Payer: Self-pay | Admitting: Family Medicine

## 2015-11-10 ENCOUNTER — Other Ambulatory Visit: Payer: Self-pay | Admitting: Family Medicine

## 2015-11-10 DIAGNOSIS — E785 Hyperlipidemia, unspecified: Secondary | ICD-10-CM

## 2015-11-12 NOTE — Telephone Encounter (Signed)
Please schedule an Physical/Fasting after 02/22/16 and forward back to me.     KP

## 2015-11-20 ENCOUNTER — Ambulatory Visit (INDEPENDENT_AMBULATORY_CARE_PROVIDER_SITE_OTHER): Admitting: Family Medicine

## 2015-11-20 ENCOUNTER — Encounter: Payer: Self-pay | Admitting: Family Medicine

## 2015-11-20 ENCOUNTER — Ambulatory Visit: Admitting: Family Medicine

## 2015-11-20 VITALS — BP 140/74 | HR 67 | Temp 98.2°F | Wt 202.0 lb

## 2015-11-20 DIAGNOSIS — R079 Chest pain, unspecified: Secondary | ICD-10-CM | POA: Diagnosis not present

## 2015-11-20 DIAGNOSIS — E785 Hyperlipidemia, unspecified: Secondary | ICD-10-CM

## 2015-11-20 DIAGNOSIS — J302 Other seasonal allergic rhinitis: Secondary | ICD-10-CM

## 2015-11-20 DIAGNOSIS — I10 Essential (primary) hypertension: Secondary | ICD-10-CM

## 2015-11-20 DIAGNOSIS — J301 Allergic rhinitis due to pollen: Secondary | ICD-10-CM

## 2015-11-20 LAB — LIPID PANEL
CHOLESTEROL: 130 mg/dL (ref 0–200)
HDL: 37.2 mg/dL — ABNORMAL LOW (ref >39.00)
LDL Cholesterol: 77 mg/dL (ref 0–99)
NONHDL: 92.63
TRIGLYCERIDES: 77 mg/dL (ref 0.0–149.0)
Total CHOL/HDL Ratio: 3
VLDL: 15.4 mg/dL (ref 0.0–40.0)

## 2015-11-20 LAB — COMPREHENSIVE METABOLIC PANEL
ALK PHOS: 75 U/L (ref 39–117)
ALT: 23 U/L (ref 0–35)
AST: 19 U/L (ref 0–37)
Albumin: 4.4 g/dL (ref 3.5–5.2)
BILIRUBIN TOTAL: 0.5 mg/dL (ref 0.2–1.2)
BUN: 10 mg/dL (ref 6–23)
CALCIUM: 10.4 mg/dL (ref 8.4–10.5)
CO2: 32 mEq/L (ref 19–32)
Chloride: 101 mEq/L (ref 96–112)
Creatinine, Ser: 0.86 mg/dL (ref 0.40–1.20)
GFR: 86.88 mL/min (ref >60.00)
GLUCOSE: 94 mg/dL (ref 70–99)
POTASSIUM: 3.9 meq/L (ref 3.5–5.1)
Sodium: 139 mEq/L (ref 135–145)
TOTAL PROTEIN: 8.3 g/dL (ref 6.0–8.3)

## 2015-11-20 MED ORDER — FLUTICASONE PROPIONATE 50 MCG/ACT NA SUSP: 2.0000 | Freq: Every day | NASAL | Status: DC

## 2015-11-20 MED ORDER — CARVEDILOL 6.25 MG PO TABS: 6.2500 mg | ORAL_TABLET | Freq: Two times a day (BID) | ORAL | Status: DC

## 2015-11-20 MED ORDER — VALSARTAN 80 MG PO TABS: 80.0000 mg | ORAL_TABLET | Freq: Every day | ORAL | Status: DC

## 2015-11-20 MED ORDER — HYDROCHLOROTHIAZIDE 25 MG PO TABS: 25.0000 mg | ORAL_TABLET | Freq: Every day | ORAL | Status: DC

## 2015-11-20 MED ORDER — AMLODIPINE BESYLATE 10 MG PO TABS: 10.0000 mg | ORAL_TABLET | Freq: Every day | ORAL | Status: DC

## 2015-11-20 MED ORDER — ATORVASTATIN CALCIUM 10 MG PO TABS: 10.0000 mg | ORAL_TABLET | Freq: Every day | ORAL | Status: DC

## 2015-11-20 MED ORDER — FEXOFENADINE HCL 180 MG PO TABS: 180.0000 mg | ORAL_TABLET | Freq: Every day | ORAL | Status: DC

## 2015-11-20 NOTE — Patient Instructions (Signed)
Hypertension Hypertension, commonly called high blood pressure, is when the force of blood pumping through your arteries is too strong. Your arteries are the blood vessels that carry blood from your heart throughout your body. A blood pressure reading consists of a higher number over a lower number, such as 110/72. The higher number (systolic) is the pressure inside your arteries when your heart pumps. The lower number (diastolic) is the pressure inside your arteries when your heart relaxes. Ideally you want your blood pressure below 120/80. Hypertension forces your heart to work harder to pump blood. Your arteries may become narrow or stiff. Having untreated or uncontrolled hypertension can cause heart attack, stroke, kidney disease, and other problems. RISK FACTORS Some risk factors for high blood pressure are controllable. Others are not.  Risk factors you cannot control include:   Race. You may be at higher risk if you are African American.  Age. Risk increases with age.  Gender. Men are at higher risk than women before age 45 years. After age 65, women are at higher risk than men. Risk factors you can control include:  Not getting enough exercise or physical activity.  Being overweight.  Getting too much fat, sugar, calories, or salt in your diet.  Drinking too much alcohol. SIGNS AND SYMPTOMS Hypertension does not usually cause signs or symptoms. Extremely high blood pressure (hypertensive crisis) may cause headache, anxiety, shortness of breath, and nosebleed. DIAGNOSIS To check if you have hypertension, your health care provider will measure your blood pressure while you are seated, with your arm held at the level of your heart. It should be measured at least twice using the same arm. Certain conditions can cause a difference in blood pressure between your right and left arms. A blood pressure reading that is higher than normal on one occasion does not mean that you need treatment. If  it is not clear whether you have high blood pressure, you may be asked to return on a different day to have your blood pressure checked again. Or, you may be asked to monitor your blood pressure at home for 1 or more weeks. TREATMENT Treating high blood pressure includes making lifestyle changes and possibly taking medicine. Living a healthy lifestyle can help lower high blood pressure. You may need to change some of your habits. Lifestyle changes may include:  Following the DASH diet. This diet is high in fruits, vegetables, and whole grains. It is low in salt, red meat, and added sugars.  Keep your sodium intake below 2,300 mg per day.  Getting at least 30-45 minutes of aerobic exercise at least 4 times per week.  Losing weight if necessary.  Not smoking.  Limiting alcoholic beverages.  Learning ways to reduce stress. Your health care provider may prescribe medicine if lifestyle changes are not enough to get your blood pressure under control, and if one of the following is true:  You are 18-59 years of age and your systolic blood pressure is above 140.  You are 60 years of age or older, and your systolic blood pressure is above 150.  Your diastolic blood pressure is above 90.  You have diabetes, and your systolic blood pressure is over 140 or your diastolic blood pressure is over 90.  You have kidney disease and your blood pressure is above 140/90.  You have heart disease and your blood pressure is above 140/90. Your personal target blood pressure may vary depending on your medical conditions, your age, and other factors. HOME CARE INSTRUCTIONS    Have your blood pressure rechecked as directed by your health care provider.   Take medicines only as directed by your health care provider. Follow the directions carefully. Blood pressure medicines must be taken as prescribed. The medicine does not work as well when you skip doses. Skipping doses also puts you at risk for  problems.  Do not smoke.   Monitor your blood pressure at home as directed by your health care provider. SEEK MEDICAL CARE IF:   You think you are having a reaction to medicines taken.  You have recurrent headaches or feel dizzy.  You have swelling in your ankles.  You have trouble with your vision. SEEK IMMEDIATE MEDICAL CARE IF:  You develop a severe headache or confusion.  You have unusual weakness, numbness, or feel faint.  You have severe chest or abdominal pain.  You vomit repeatedly.  You have trouble breathing. MAKE SURE YOU:   Understand these instructions.  Will watch your condition.  Will get help right away if you are not doing well or get worse.   This information is not intended to replace advice given to you by your health care provider. Make sure you discuss any questions you have with your health care provider.   Document Released: 10/20/2005 Document Revised: 03/06/2015 Document Reviewed: 08/12/2013 Elsevier Interactive Patient Education 2016 Elsevier Inc.  

## 2015-11-20 NOTE — Progress Notes (Signed)
Pre visit review using our clinic review tool, if applicable. No additional management support is needed unless otherwise documented below in the visit note. 

## 2015-11-21 DIAGNOSIS — R079 Chest pain, unspecified: Secondary | ICD-10-CM | POA: Insufficient documentation

## 2015-11-21 NOTE — Assessment & Plan Note (Signed)
Use flonase and otc antihistamine

## 2015-11-21 NOTE — Assessment & Plan Note (Signed)
Cont lipitor. Check labs.

## 2015-11-21 NOTE — Progress Notes (Signed)
Patient ID: Alicia Knapp, female    DOB: 12/20/1954  Age: 60 y.o. MRN: [id number]    Subjective:  Subjective HPI Alicia Knapp presents for htn, cholesterol and c/o uri symptoms.  She is also c/o chest pain.  No sob or palpations.    Review of Systems  Constitutional: Negative for fever and chills.  HENT: Positive for congestion, postnasal drip and rhinorrhea. Negative for sinus pressure.   Respiratory: Negative for cough, chest tightness, shortness of breath and wheezing.   Cardiovascular: Positive for chest pain. Negative for palpitations and leg swelling.  Allergic/Immunologic: Negative for environmental allergies.    History Past Medical History  Diagnosis Date  . Anxiety   . Hypertension   . Postoperative nausea     She has past surgical history that includes Abdominal hysterectomy and Appendectomy.   Her family history includes Arthritis in her maternal grandmother; Cancer in her father; Diabetes in her mother; Hypertension in her brother and sister; Stroke in her brother; Thyroid disease in her mother.She reports that she quit smoking about 14 months ago. Her smoking use included Cigarettes. She has never used smokeless tobacco. She reports that she does not drink alcohol or use illicit drugs.  Current Outpatient Prescriptions on File Prior to Visit  Medication Sig Dispense Refill  . ARIPiprazole (ABILIFY) 10 MG tablet Take 10 mg by mouth daily.      [name] aspirin 81 MG tablet Take 81 mg by mouth daily.      . cholecalciferol (VITAMIN D) 1000 UNITS tablet Take 1,000 Units by mouth daily.    [name] LORazepam (ATIVAN) 1 MG tablet Take 1 mg by mouth every 8 (eight) hours.      . Multiple Vitamin (MULTIVITAMIN) tablet Take 1 tablet by mouth daily.    . potassium chloride SA (K-DUR,KLOR-CON) 20 MEQ tablet Take 1 tablet (20 mEq total) by mouth daily. (Patient taking differently: Take 20 mEq by mouth daily. Taking 3 in the morning and 3 in the evening) 30 tablet 2   No current  facility-administered medications on file prior to visit.     Objective:  Objective Physical Exam  Constitutional: She is oriented to person, place, and time. She appears well-developed and well-nourished.  HENT:  Right Ear: External ear normal.  Left Ear: External ear normal.  Nose: Rhinorrhea present. Right sinus exhibits no maxillary sinus tenderness and no frontal sinus tenderness. Left sinus exhibits no maxillary sinus tenderness and no frontal sinus tenderness.  Mouth/Throat: Posterior oropharyngeal erythema present. No posterior oropharyngeal edema.  + PND + errythema  Eyes: Conjunctivae are normal. Right eye exhibits no discharge. Left eye exhibits no discharge.  Cardiovascular: Normal rate, regular rhythm and normal heart sounds.   No murmur heard. Pulmonary/Chest: Effort normal and breath sounds normal. No respiratory distress. She has no wheezes. She has no rales. She exhibits no tenderness.  Musculoskeletal: She exhibits no edema.  Lymphadenopathy:    She has no cervical adenopathy.  Neurological: She is alert and oriented to person, place, and time.  Psychiatric: She has a normal mood and affect. Her behavior is normal. Judgment and thought content normal.  Nursing note and vitals reviewed.  BP 140/74 mmHg  Pulse 67  Temp(Src) 98.2 F (36.8 C) (Oral)  Wt 202 lb (91.627 kg)  SpO2 97% Wt Readings from Last 3 Encounters:  11/20/15 202 lb (91.627 kg)  05/22/15 206 lb (93.441 kg)  05/08/15 206 lb 6.4 oz (93.622 kg)     Lab Results  Component Value Date  WBC 3.9* 02/22/2015   HGB 12.7 02/22/2015   HCT 37.8 02/22/2015   PLT 361.0 02/22/2015   GLUCOSE 94 11/20/2015   CHOL 130 11/20/2015   TRIG 77.0 11/20/2015   HDL 37.20* 11/20/2015   LDLDIRECT 140.5 08/12/2013   LDLCALC 77 11/20/2015   ALT 23 11/20/2015   AST 19 11/20/2015   NA 139 11/20/2015   K 3.9 11/20/2015   CL 101 11/20/2015   CREATININE 0.86 11/20/2015   BUN 10 11/20/2015   CO2 32 11/20/2015    TSH 1.21 02/22/2015   MICROALBUR <0.7 02/22/2015   EKG-- NSR Mm Digital Screening Bilateral  02/22/2015  CLINICAL DATA:  Screening. EXAM: DIGITAL SCREENING BILATERAL MAMMOGRAM WITH CAD COMPARISON:  Previous exam(s). ACR Breast Density Category c: The breast tissue is heterogeneously dense, which may obscure small masses. FINDINGS: There are no findings suspicious for malignancy. Images were processed with CAD. IMPRESSION: No mammographic evidence of malignancy. A result letter of this screening mammogram will be mailed directly to the patient. RECOMMENDATION: Screening mammogram in one year. (Code:SM-B-01Y) BI-RADS CATEGORY  1: Negative. Electronically Signed   By: Andres Shad   On: 02/22/2015 14:55     Assessment & Plan:  Plan I have discontinued Ms. Rabelo's Fish Oil. I have also changed her amLODipine, atorvastatin, carvedilol, and hydrochlorothiazide. Additionally, I am having her maintain her ARIPiprazole, aspirin, LORazepam, cholecalciferol, potassium chloride SA, multivitamin, valsartan, fexofenadine, and fluticasone.  Meds ordered this encounter  Medications  . amLODipine (NORVASC) 10 MG tablet    Sig: Take 1 tablet (10 mg total) by mouth daily.    Dispense:  90 tablet    Refill:  1  . atorvastatin (LIPITOR) 10 MG tablet    Sig: Take 1 tablet (10 mg total) by mouth daily.    Dispense:  90 tablet    Refill:  1  . carvedilol (COREG) 6.25 MG tablet    Sig: Take 1 tablet (6.25 mg total) by mouth 2 (two) times daily with a meal.    Dispense:  180 tablet    Refill:  1  . hydrochlorothiazide (HYDRODIURIL) 25 MG tablet    Sig: Take 1 tablet (25 mg total) by mouth daily.    Dispense:  90 tablet    Refill:  1  . valsartan (DIOVAN) 80 MG tablet    Sig: Take 1 tablet (80 mg total) by mouth daily.    Dispense:  90 tablet    Refill:  3  . fexofenadine (ALLEGRA) 180 MG tablet    Sig: Take 1 tablet (180 mg total) by mouth daily.    Dispense:  30 tablet    Refill:  11  . fluticasone  (FLONASE) 50 MCG/ACT nasal spray    Sig: Place 2 sprays into both nostrils daily.    Dispense:  48 g    Refill:  2    Problem List Items Addressed This Visit      Unprioritized   Hyperlipidemia LDL goal <100    Con' t lipitor Check labs      Relevant Medications   amLODipine (NORVASC) 10 MG tablet   atorvastatin (LIPITOR) 10 MG tablet   carvedilol (COREG) 6.25 MG tablet   hydrochlorothiazide (HYDRODIURIL) 25 MG tablet   valsartan (DIOVAN) 80 MG tablet   Essential hypertension     Current outpatient prescriptions:  .  amLODipine (NORVASC) 10 MG tablet, Take 1 tablet (10 mg total) by mouth daily., Disp: 90 tablet, Rfl: 1 .  ARIPiprazole (ABILIFY) 10 MG tablet, Take  10 mg by mouth daily.  , Disp: , Rfl:  .  aspirin 81 MG tablet, Take 81 mg by mouth daily.  , Disp: , Rfl:  .  atorvastatin (LIPITOR) 10 MG tablet, Take 1 tablet (10 mg total) by mouth daily., Disp: 90 tablet, Rfl: 1 .  carvedilol (COREG) 6.25 MG tablet, Take 1 tablet (6.25 mg total) by mouth 2 (two) times daily with a meal., Disp: 180 tablet, Rfl: 1 .  cholecalciferol (VITAMIN D) 1000 UNITS tablet, Take 1,000 Units by mouth daily., Disp: , Rfl:  .  fexofenadine (ALLEGRA) 180 MG tablet, Take 1 tablet (180 mg total) by mouth daily., Disp: 30 tablet, Rfl: 11 .  fluticasone (FLONASE) 50 MCG/ACT nasal spray, Place 2 sprays into both nostrils daily., Disp: 48 g, Rfl: 2 .  hydrochlorothiazide (HYDRODIURIL) 25 MG tablet, Take 1 tablet (25 mg total) by mouth daily., Disp: 90 tablet, Rfl: 1 .  LORazepam (ATIVAN) 1 MG tablet, Take 1 mg by mouth every 8 (eight) hours.  , Disp: , Rfl:  .  Multiple Vitamin (MULTIVITAMIN) tablet, Take 1 tablet by mouth daily., Disp: , Rfl:  .  potassium chloride SA (K-DUR,KLOR-CON) 20 MEQ tablet, Take 1 tablet (20 mEq total) by mouth daily. (Patient taking differently: Take 20 mEq by mouth daily. Taking 3 in the morning and 3 in the evening), Disp: 30 tablet, Rfl: 2 .  valsartan (DIOVAN) 80 MG tablet,  Take 1 tablet (80 mg total) by mouth daily., Disp: 90 tablet, Rfl: 3      Relevant Medications   amLODipine (NORVASC) 10 MG tablet   atorvastatin (LIPITOR) 10 MG tablet   carvedilol (COREG) 6.25 MG tablet   hydrochlorothiazide (HYDRODIURIL) 25 MG tablet   valsartan (DIOVAN) 80 MG tablet   Other Relevant Orders   Comp Met (CMET) (Completed)   Lipid panel (Completed)   Chest pain    ekg normal If pain occurs again -- rto or go to ER      Relevant Orders   EKG 12-Lead (Completed)   ALLERGIC RHINITIS DUE TO POLLEN    Use flonase and otc antihistamine       Other Visit Diagnoses    Seasonal allergies    -  Primary    Relevant Medications    fluticasone (FLONASE) 50 MCG/ACT nasal spray    Hyperlipidemia        Relevant Medications    amLODipine (NORVASC) 10 MG tablet    atorvastatin (LIPITOR) 10 MG tablet    carvedilol (COREG) 6.25 MG tablet    hydrochlorothiazide (HYDRODIURIL) 25 MG tablet    valsartan (DIOVAN) 80 MG tablet    Other Relevant Orders    Comp Met (CMET) (Completed)    Lipid panel (Completed)       Follow-up: Return in about 3 months (around 02/18/2016), or if symptoms worsen or fail to improve, for hypertension, hyperlipidemia.  Garnet Koyanagi, DO

## 2015-11-21 NOTE — Assessment & Plan Note (Signed)
ekg normal If pain occurs again -- rto or go to ER

## 2015-11-21 NOTE — Assessment & Plan Note (Signed)
  Current outpatient prescriptions:  .  amLODipine (NORVASC) 10 MG tablet, Take 1 tablet (10 mg total) by mouth daily., Disp: 90 tablet, Rfl: 1 .  ARIPiprazole (ABILIFY) 10 MG tablet, Take 10 mg by mouth daily.  , Disp: , Rfl:  .  aspirin 81 MG tablet, Take 81 mg by mouth daily.  , Disp: , Rfl:  .  atorvastatin (LIPITOR) 10 MG tablet, Take 1 tablet (10 mg total) by mouth daily., Disp: 90 tablet, Rfl: 1 .  carvedilol (COREG) 6.25 MG tablet, Take 1 tablet (6.25 mg total) by mouth 2 (two) times daily with a meal., Disp: 180 tablet, Rfl: 1 .  cholecalciferol (VITAMIN D) 1000 UNITS tablet, Take 1,000 Units by mouth daily., Disp: , Rfl:  .  fexofenadine (ALLEGRA) 180 MG tablet, Take 1 tablet (180 mg total) by mouth daily., Disp: 30 tablet, Rfl: 11 .  fluticasone (FLONASE) 50 MCG/ACT nasal spray, Place 2 sprays into both nostrils daily., Disp: 48 g, Rfl: 2 .  hydrochlorothiazide (HYDRODIURIL) 25 MG tablet, Take 1 tablet (25 mg total) by mouth daily., Disp: 90 tablet, Rfl: 1 .  LORazepam (ATIVAN) 1 MG tablet, Take 1 mg by mouth every 8 (eight) hours.  , Disp: , Rfl:  .  Multiple Vitamin (MULTIVITAMIN) tablet, Take 1 tablet by mouth daily., Disp: , Rfl:  .  potassium chloride SA (K-DUR,KLOR-CON) 20 MEQ tablet, Take 1 tablet (20 mEq total) by mouth daily. (Patient taking differently: Take 20 mEq by mouth daily. Taking 3 in the morning and 3 in the evening), Disp: 30 tablet, Rfl: 2 .  valsartan (DIOVAN) 80 MG tablet, Take 1 tablet (80 mg total) by mouth daily., Disp: 90 tablet, Rfl: 3

## 2015-12-31 NOTE — Telephone Encounter (Signed)
Patient scheduled for 04/18/2016 at 10:30am for physical appointment.

## 2016-02-18 ENCOUNTER — Ambulatory Visit (INDEPENDENT_AMBULATORY_CARE_PROVIDER_SITE_OTHER): Admitting: Family Medicine

## 2016-02-18 ENCOUNTER — Encounter: Payer: Self-pay | Admitting: Family Medicine

## 2016-02-18 VITALS — BP 130/74 | HR 64 | Temp 98.3°F | Ht 66.0 in | Wt 204.0 lb

## 2016-02-18 DIAGNOSIS — I10 Essential (primary) hypertension: Secondary | ICD-10-CM | POA: Diagnosis not present

## 2016-02-18 DIAGNOSIS — E785 Hyperlipidemia, unspecified: Secondary | ICD-10-CM

## 2016-02-18 DIAGNOSIS — J302 Other seasonal allergic rhinitis: Secondary | ICD-10-CM

## 2016-02-18 MED ORDER — LEVOCETIRIZINE DIHYDROCHLORIDE 5 MG PO TABS: 5.0000 mg | ORAL_TABLET | Freq: Every evening | ORAL | Status: DC

## 2016-02-18 MED ORDER — CARVEDILOL 6.25 MG PO TABS: 6.2500 mg | ORAL_TABLET | Freq: Two times a day (BID) | ORAL | Status: DC

## 2016-02-18 MED ORDER — ATORVASTATIN CALCIUM 10 MG PO TABS: 10.0000 mg | ORAL_TABLET | Freq: Every day | ORAL | Status: DC

## 2016-02-18 MED ORDER — HYDROCHLOROTHIAZIDE 25 MG PO TABS: 25.0000 mg | ORAL_TABLET | Freq: Every day | ORAL | Status: DC

## 2016-02-18 MED ORDER — VALSARTAN 80 MG PO TABS: 80.0000 mg | ORAL_TABLET | Freq: Every day | ORAL | Status: DC

## 2016-02-18 MED ORDER — POTASSIUM CHLORIDE CRYS ER 20 MEQ PO TBCR: 20.0000 meq | EXTENDED_RELEASE_TABLET | Freq: Every day | ORAL | Status: DC

## 2016-02-18 NOTE — Progress Notes (Signed)
Patient ID: Alicia Knapp, female    DOB: 12/20/1954  Age: 60 y.o. MRN: [id number]    Subjective:  Subjective HPI Alicia Knapp presents for f/u bp and cholesterol but she has her cpe in June.  She would like to wait and get her labs then. She is also   Review of Systems  Constitutional: Negative for diaphoresis, appetite change, fatigue and unexpected weight change.  Eyes: Negative for pain, redness and visual disturbance.  Respiratory: Negative for cough, chest tightness, shortness of breath and wheezing.   Cardiovascular: Negative for chest pain, palpitations and leg swelling.  Endocrine: Negative for cold intolerance, heat intolerance, polydipsia, polyphagia and polyuria.  Genitourinary: Negative for dysuria, frequency and difficulty urinating.  Neurological: Negative for dizziness, light-headedness, numbness and headaches.    History Past Medical History  Diagnosis Date  . Anxiety   . Hypertension   . Postoperative nausea     She has past surgical history that includes Abdominal hysterectomy and Appendectomy.   Her family history includes Arthritis in her maternal grandmother; Cancer in her father; Diabetes in her mother; Hypertension in her brother and sister; Stroke in her brother; Thyroid disease in her mother.She reports that she quit smoking about 17 months ago. Her smoking use included Cigarettes. She has never used smokeless tobacco. She reports that she does not drink alcohol or use illicit drugs.  Current Outpatient Prescriptions on File Prior to Visit  Medication Sig Dispense Refill  . amLODipine (NORVASC) 10 MG tablet Take 1 tablet (10 mg total) by mouth daily. 90 tablet 1  . ARIPiprazole (ABILIFY) 10 MG tablet Take 10 mg by mouth daily.      [name] aspirin 81 MG tablet Take 81 mg by mouth daily.      . cholecalciferol (VITAMIN D) 1000 UNITS tablet Take 1,000 Units by mouth daily.    . fluticasone (FLONASE) 50 MCG/ACT nasal spray Place 2 sprays into both nostrils daily. 48  g 2  . LORazepam (ATIVAN) 1 MG tablet Take 1 mg by mouth every 8 (eight) hours.      . Multiple Vitamin (MULTIVITAMIN) tablet Take 1 tablet by mouth daily.     No current facility-administered medications on file prior to visit.     Objective:  Objective Physical Exam  Constitutional: She is oriented to person, place, and time. She appears well-developed and well-nourished.  HENT:  Head: Normocephalic and atraumatic.  Eyes: Conjunctivae and EOM are normal.  Neck: Normal range of motion. Neck supple. No JVD present. Carotid bruit is not present. No thyromegaly present.  Cardiovascular: Normal rate, regular rhythm and normal heart sounds.   No murmur heard. Pulmonary/Chest: Effort normal and breath sounds normal. No respiratory distress. She has no wheezes. She has no rales. She exhibits no tenderness.  Musculoskeletal: She exhibits no edema.  Neurological: She is alert and oriented to person, place, and time.  Psychiatric: She has a normal mood and affect.  Nursing note and vitals reviewed.  BP 130/74 mmHg  Pulse 64  Temp(Src) 98.3 F (36.8 C) (Oral)  Ht 5\' 6"  (1.676 m)  Wt 204 lb (92.534 kg)  BMI 32.94 kg/m2  SpO2 98% Wt Readings from Last 3 Encounters:  02/18/16 204 lb (92.534 kg)  11/20/15 202 lb (91.627 kg)  05/22/15 206 lb (93.441 kg)     Lab Results  Component Value Date   WBC 3.9* 02/22/2015   HGB 12.7 02/22/2015   HCT 37.8 02/22/2015   PLT 361.0 02/22/2015   GLUCOSE 94 11/20/2015  CHOL 130 11/20/2015   TRIG 77.0 11/20/2015   HDL 37.20* 11/20/2015   LDLDIRECT 140.5 08/12/2013   LDLCALC 77 11/20/2015   ALT 23 11/20/2015   AST 19 11/20/2015   NA 139 11/20/2015   K 3.9 11/20/2015   CL 101 11/20/2015   CREATININE 0.86 11/20/2015   BUN 10 11/20/2015   CO2 32 11/20/2015   TSH 1.21 02/22/2015   MICROALBUR <0.7 02/22/2015    Mm Digital Screening Bilateral  02/22/2015  CLINICAL DATA:  Screening. EXAM: DIGITAL SCREENING BILATERAL MAMMOGRAM WITH CAD  COMPARISON:  Previous exam(s). ACR Breast Density Category c: The breast tissue is heterogeneously dense, which may obscure small masses. FINDINGS: There are no findings suspicious for malignancy. Images were processed with CAD. IMPRESSION: No mammographic evidence of malignancy. A result letter of this screening mammogram will be mailed directly to the patient. RECOMMENDATION: Screening mammogram in one year. (Code:SM-B-01Y) BI-RADS CATEGORY  1: Negative. Electronically Signed   By: Andres Shad   On: 02/22/2015 14:55     Assessment & Plan:  Plan I have discontinued Ms. Scull's fexofenadine. I am also having her maintain her ARIPiprazole, aspirin, LORazepam, cholecalciferol, multivitamin, amLODipine, fluticasone, levocetirizine, valsartan, potassium chloride SA, hydrochlorothiazide, carvedilol, and atorvastatin.  Meds ordered this encounter  Medications  . DISCONTD: levocetirizine (XYZAL) 5 MG tablet    Sig: Take 1 tablet (5 mg total) by mouth every evening.    Dispense:  30 tablet    Refill:  5  . levocetirizine (XYZAL) 5 MG tablet    Sig: Take 1 tablet (5 mg total) by mouth every evening.    Dispense:  30 tablet    Refill:  5  . valsartan (DIOVAN) 80 MG tablet    Sig: Take 1 tablet (80 mg total) by mouth daily.    Dispense:  90 tablet    Refill:  3  . potassium chloride SA (K-DUR,KLOR-CON) 20 MEQ tablet    Sig: Take 1 tablet (20 mEq total) by mouth daily.    Dispense:  30 tablet    Refill:  2  . hydrochlorothiazide (HYDRODIURIL) 25 MG tablet    Sig: Take 1 tablet (25 mg total) by mouth daily.    Dispense:  90 tablet    Refill:  1  . carvedilol (COREG) 6.25 MG tablet    Sig: Take 1 tablet (6.25 mg total) by mouth 2 (two) times daily with a meal.    Dispense:  180 tablet    Refill:  1  . atorvastatin (LIPITOR) 10 MG tablet    Sig: Take 1 tablet (10 mg total) by mouth daily.    Dispense:  90 tablet    Refill:  1    Problem List Items Addressed This Visit      Unprioritized     Hyperlipidemia LDL goal <100    Check labs con't meds      Relevant Medications   valsartan (DIOVAN) 80 MG tablet   hydrochlorothiazide (HYDRODIURIL) 25 MG tablet   carvedilol (COREG) 6.25 MG tablet   atorvastatin (LIPITOR) 10 MG tablet   Essential hypertension    Stable Cont meds rto for cpe and labs      Relevant Medications   valsartan (DIOVAN) 80 MG tablet   potassium chloride SA (K-DUR,KLOR-CON) 20 MEQ tablet   hydrochlorothiazide (HYDRODIURIL) 25 MG tablet   carvedilol (COREG) 6.25 MG tablet   atorvastatin (LIPITOR) 10 MG tablet    Other Visit Diagnoses    Seasonal allergies    -  Primary    Relevant Medications    levocetirizine (XYZAL) 5 MG tablet    Hyperlipidemia        Relevant Medications    valsartan (DIOVAN) 80 MG tablet    hydrochlorothiazide (HYDRODIURIL) 25 MG tablet    carvedilol (COREG) 6.25 MG tablet    atorvastatin (LIPITOR) 10 MG tablet       Follow-up: Return if symptoms worsen or fail to improve, for as scheduled.  Ann Held, DO     ++

## 2016-02-18 NOTE — Progress Notes (Signed)
Pre visit review using our clinic review tool, if applicable. No additional management support is needed unless otherwise documented below in the visit note. 

## 2016-02-18 NOTE — Patient Instructions (Signed)

## 2016-02-18 NOTE — Assessment & Plan Note (Signed)
Check labs con't meds 

## 2016-02-18 NOTE — Assessment & Plan Note (Signed)
Stable Cont meds rto for cpe and labs

## 2016-04-18 ENCOUNTER — Encounter: Payer: Self-pay | Admitting: Family Medicine

## 2016-04-18 ENCOUNTER — Ambulatory Visit (INDEPENDENT_AMBULATORY_CARE_PROVIDER_SITE_OTHER): Admitting: Family Medicine

## 2016-04-18 VITALS — BP 126/72 | HR 57 | Temp 98.1°F | Ht 66.0 in | Wt 199.0 lb

## 2016-04-18 DIAGNOSIS — J302 Other seasonal allergic rhinitis: Secondary | ICD-10-CM | POA: Diagnosis not present

## 2016-04-18 DIAGNOSIS — Z1159 Encounter for screening for other viral diseases: Secondary | ICD-10-CM

## 2016-04-18 DIAGNOSIS — Z23 Encounter for immunization: Secondary | ICD-10-CM | POA: Diagnosis not present

## 2016-04-18 DIAGNOSIS — E785 Hyperlipidemia, unspecified: Secondary | ICD-10-CM | POA: Diagnosis not present

## 2016-04-18 DIAGNOSIS — Z0000 Encounter for general adult medical examination without abnormal findings: Secondary | ICD-10-CM

## 2016-04-18 LAB — CBC WITH DIFFERENTIAL/PLATELET
BASOS ABS: 0 10*3/uL (ref 0.0–0.1)
Basophils Relative: 0.5 % (ref 0.0–3.0)
EOS ABS: 0.1 10*3/uL (ref 0.0–0.7)
Eosinophils Relative: 2.7 % (ref 0.0–5.0)
HCT: 38.1 % (ref 36.0–46.0)
Hemoglobin: 12.7 g/dL (ref 12.0–15.0)
LYMPHS ABS: 1.7 10*3/uL (ref 0.7–4.0)
Lymphocytes Relative: 48.1 % — ABNORMAL HIGH (ref 12.0–46.0)
MCHC: 33.3 g/dL (ref 30.0–36.0)
MCV: 86.9 fl (ref 78.0–100.0)
MONOS PCT: 6.1 % (ref 3.0–12.0)
Monocytes Absolute: 0.2 10*3/uL (ref 0.1–1.0)
NEUTROS ABS: 1.5 10*3/uL (ref 1.4–7.7)
NEUTROS PCT: 42.6 % — AB (ref 43.0–77.0)
PLATELETS: 354 10*3/uL (ref 150.0–400.0)
RBC: 4.38 Mil/uL (ref 3.87–5.11)
RDW: 14.1 % (ref 11.5–15.5)
WBC: 3.6 10*3/uL — ABNORMAL LOW (ref 4.0–10.5)

## 2016-04-18 LAB — TSH: TSH: 1.42 u[IU]/mL (ref 0.35–4.50)

## 2016-04-18 LAB — POCT URINALYSIS DIPSTICK
BILIRUBIN UA: NEGATIVE
GLUCOSE UA: NEGATIVE
KETONES UA: NEGATIVE
Leukocytes, UA: NEGATIVE
Nitrite, UA: NEGATIVE
PH UA: 6.5
Protein, UA: NEGATIVE
RBC UA: NEGATIVE
Spec Grav, UA: 1.015
Urobilinogen, UA: 0.2

## 2016-04-18 LAB — LIPID PANEL
CHOL/HDL RATIO: 3
Cholesterol: 133 mg/dL (ref 0–200)
HDL: 40.5 mg/dL (ref >39.00)
LDL CALC: 77 mg/dL (ref 0–99)
NONHDL: 92.52
TRIGLYCERIDES: 77 mg/dL (ref 0.0–149.0)
VLDL: 15.4 mg/dL (ref 0.0–40.0)

## 2016-04-18 LAB — COMPREHENSIVE METABOLIC PANEL
ALK PHOS: 70 U/L (ref 39–117)
ALT: 22 U/L (ref 0–35)
AST: 17 U/L (ref 0–37)
Albumin: 4.4 g/dL (ref 3.5–5.2)
BILIRUBIN TOTAL: 0.5 mg/dL (ref 0.2–1.2)
BUN: 12 mg/dL (ref 6–23)
CO2: 32 meq/L (ref 19–32)
CREATININE: 0.89 mg/dL (ref 0.40–1.20)
Calcium: 10.2 mg/dL (ref 8.4–10.5)
Chloride: 102 mEq/L (ref 96–112)
GFR: 83.39 mL/min (ref >60.00)
GLUCOSE: 99 mg/dL (ref 70–99)
Potassium: 3.9 mEq/L (ref 3.5–5.1)
SODIUM: 139 meq/L (ref 135–145)
TOTAL PROTEIN: 8.1 g/dL (ref 6.0–8.3)

## 2016-04-18 MED ORDER — FLUTICASONE PROPIONATE 50 MCG/ACT NA SUSP: 2.0000 | Freq: Every day | NASAL | Status: DC

## 2016-04-18 MED ORDER — ATORVASTATIN CALCIUM 10 MG PO TABS: 10.0000 mg | ORAL_TABLET | Freq: Every day | ORAL | Status: DC

## 2016-04-18 NOTE — Progress Notes (Signed)
Pre visit review using our clinic review tool, if applicable. No additional management support is needed unless otherwise documented below in the visit note. 

## 2016-04-18 NOTE — Patient Instructions (Addendum)
Preventive Care for Adults, Female A healthy lifestyle and preventive care can promote health and wellness. Preventive health guidelines for women include the following key practices.  A routine yearly physical is a good way to check with your health care provider about your health and preventive screening. It is a chance to share any concerns and updates on your health and to receive a thorough exam.  Visit your dentist for a routine exam and preventive care every 6 months. Brush your teeth twice a day and floss once a day. Good oral hygiene prevents tooth decay and gum disease.  The frequency of eye exams is based on your age, health, family medical history, use of contact lenses, and other factors. Follow your health care provider's recommendations for frequency of eye exams.  Eat a healthy diet. Foods like vegetables, fruits, whole grains, low-fat dairy products, and lean protein foods contain the nutrients you need without too many calories. Decrease your intake of foods high in solid fats, added sugars, and salt. Eat the right amount of calories for you.Get information about a proper diet from your health care provider, if necessary.  Regular physical exercise is one of the most important things you can do for your health. Most adults should get at least 150 minutes of moderate-intensity exercise (any activity that increases your heart rate and causes you to sweat) each week. In addition, most adults need muscle-strengthening exercises on 2 or more days a week.  Maintain a healthy weight. The body mass index (BMI) is a screening tool to identify possible weight problems. It provides an estimate of body fat based on height and weight. Your health care provider can find your BMI and can help you achieve or maintain a healthy weight.For adults 20 years and older:  A BMI below 18.5 is considered underweight.  A BMI of 18.5 to 24.9 is normal.  A BMI of 25 to 29.9 is considered overweight.  A  BMI of 30 and above is considered obese.  Maintain normal blood lipids and cholesterol levels by exercising and minimizing your intake of saturated fat. Eat a balanced diet with plenty of fruit and vegetables. Blood tests for lipids and cholesterol should begin at age 45 and be repeated every 5 years. If your lipid or cholesterol levels are high, you are over 50, or you are at high risk for heart disease, you may need your cholesterol levels checked more frequently.Ongoing high lipid and cholesterol levels should be treated with medicines if diet and exercise are not working.  If you smoke, find out from your health care provider how to quit. If you do not use tobacco, do not start.  Lung cancer screening is recommended for adults aged 45-80 years who are at high risk for developing lung cancer because of a history of smoking. A yearly low-dose CT scan of the lungs is recommended for people who have at least a 30-pack-year history of smoking and are a current smoker or have quit within the past 15 years. A pack year of smoking is smoking an average of 1 pack of cigarettes a day for 1 year (for example: 1 pack a day for 30 years or 2 packs a day for 15 years). Yearly screening should continue until the smoker has stopped smoking for at least 15 years. Yearly screening should be stopped for people who develop a health problem that would prevent them from having lung cancer treatment.  If you are pregnant, do not drink alcohol. If you are  breastfeeding, be very cautious about drinking alcohol. If you are not pregnant and choose to drink alcohol, do not have more than 1 drink per day. One drink is considered to be 12 ounces (355 mL) of beer, 5 ounces (148 mL) of wine, or 1.5 ounces (44 mL) of liquor.  Avoid use of street drugs. Do not share needles with anyone. Ask for help if you need support or instructions about stopping the use of drugs.  High blood pressure causes heart disease and increases the risk  of stroke. Your blood pressure should be checked at least every 1 to 2 years. Ongoing high blood pressure should be treated with medicines if weight loss and exercise do not work.  If you are 55-79 years old, ask your health care provider if you should take aspirin to prevent strokes.  Diabetes screening is done by taking a blood sample to check your blood glucose level after you have not eaten for a certain period of time (fasting). If you are not overweight and you do not have risk factors for diabetes, you should be screened once every 3 years starting at age 45. If you are overweight or obese and you are 40-70 years of age, you should be screened for diabetes every year as part of your cardiovascular risk assessment.  Breast cancer screening is essential preventive care for women. You should practice "breast self-awareness." This means understanding the normal appearance and feel of your breasts and may include breast self-examination. Any changes detected, no matter how small, should be reported to a health care provider. Women in their 20s and 30s should have a clinical breast exam (CBE) by a health care provider as part of a regular health exam every 1 to 3 years. After age 40, women should have a CBE every year. Starting at age 40, women should consider having a mammogram (breast X-ray test) every year. Women who have a family history of breast cancer should talk to their health care provider about genetic screening. Women at a high risk of breast cancer should talk to their health care providers about having an MRI and a mammogram every year.  Breast cancer gene (BRCA)-related cancer risk assessment is recommended for women who have family members with BRCA-related cancers. BRCA-related cancers include breast, ovarian, tubal, and peritoneal cancers. Having family members with these cancers may be associated with an increased risk for harmful changes (mutations) in the breast cancer genes BRCA1 and  BRCA2. Results of the assessment will determine the need for genetic counseling and BRCA1 and BRCA2 testing.  Your health care provider may recommend that you be screened regularly for cancer of the pelvic organs (ovaries, uterus, and vagina). This screening involves a pelvic examination, including checking for microscopic changes to the surface of your cervix (Pap test). You may be encouraged to have this screening done every 3 years, beginning at age 21.  For women ages 30-65, health care providers may recommend pelvic exams and Pap testing every 3 years, or they may recommend the Pap and pelvic exam, combined with testing for human papilloma virus (HPV), every 5 years. Some types of HPV increase your risk of cervical cancer. Testing for HPV may also be done on women of any age with unclear Pap test results.  Other health care providers may not recommend any screening for nonpregnant women who are considered low risk for pelvic cancer and who do not have symptoms. Ask your health care provider if a screening pelvic exam is right for   you.  If you have had past treatment for cervical cancer or a condition that could lead to cancer, you need Pap tests and screening for cancer for at least 20 years after your treatment. If Pap tests have been discontinued, your risk factors (such as having a new sexual partner) need to be reassessed to determine if screening should resume. Some women have medical problems that increase the chance of getting cervical cancer. In these cases, your health care provider may recommend more frequent screening and Pap tests.  Colorectal cancer can be detected and often prevented. Most routine colorectal cancer screening begins at the age of 50 years and continues through age 75 years. However, your health care provider may recommend screening at an earlier age if you have risk factors for colon cancer. On a yearly basis, your health care provider may provide home test kits to check  for hidden blood in the stool. Use of a small camera at the end of a tube, to directly examine the colon (sigmoidoscopy or colonoscopy), can detect the earliest forms of colorectal cancer. Talk to your health care provider about this at age 50, when routine screening begins. Direct exam of the colon should be repeated every 5-10 years through age 75 years, unless early forms of precancerous polyps or small growths are found.  People who are at an increased risk for hepatitis B should be screened for this virus. You are considered at high risk for hepatitis B if:  You were born in a country where hepatitis B occurs often. Talk with your health care provider about which countries are considered high risk.  Your parents were born in a high-risk country and you have not received a shot to protect against hepatitis B (hepatitis B vaccine).  You have HIV or AIDS.  You use needles to inject street drugs.  You live with, or have sex with, someone who has hepatitis B.  You get hemodialysis treatment.  You take certain medicines for conditions like cancer, organ transplantation, and autoimmune conditions.  Hepatitis C blood testing is recommended for all people born from 1945 through 1965 and any individual with known risks for hepatitis C.  Practice safe sex. Use condoms and avoid high-risk sexual practices to reduce the spread of sexually transmitted infections (STIs). STIs include gonorrhea, chlamydia, syphilis, trichomonas, herpes, HPV, and human immunodeficiency virus (HIV). Herpes, HIV, and HPV are viral illnesses that have no cure. They can result in disability, cancer, and death.  You should be screened for sexually transmitted illnesses (STIs) including gonorrhea and chlamydia if:  You are sexually active and are younger than 24 years.  You are older than 24 years and your health care provider tells you that you are at risk for this type of infection.  Your sexual activity has changed  since you were last screened and you are at an increased risk for chlamydia or gonorrhea. Ask your health care provider if you are at risk.  If you are at risk of being infected with HIV, it is recommended that you take a prescription medicine daily to prevent HIV infection. This is called preexposure prophylaxis (PrEP). You are considered at risk if:  You are sexually active and do not regularly use condoms or know the HIV status of your partner(s).  You take drugs by injection.  You are sexually active with a partner who has HIV.  Talk with your health care provider about whether you are at high risk of being infected with HIV. If   you choose to begin PrEP, you should first be tested for HIV. You should then be tested every 3 months for as long as you are taking PrEP.  Osteoporosis is a disease in which the bones lose minerals and strength with aging. This can result in serious bone fractures or breaks. The risk of osteoporosis can be identified using a bone density scan. Women ages 67 years and over and women at risk for fractures or osteoporosis should discuss screening with their health care providers. Ask your health care provider whether you should take a calcium supplement or vitamin D to reduce the rate of osteoporosis.  Menopause can be associated with physical symptoms and risks. Hormone replacement therapy is available to decrease symptoms and risks. You should talk to your health care provider about whether hormone replacement therapy is right for you.  Use sunscreen. Apply sunscreen liberally and repeatedly throughout the day. You should seek shade when your shadow is shorter than you. Protect yourself by wearing long sleeves, pants, a wide-brimmed hat, and sunglasses year round, whenever you are outdoors.  Once a month, do a whole body skin exam, using a mirror to look at the skin on your back. Tell your health care provider of new moles, moles that have irregular borders, moles that  are larger than a pencil eraser, or moles that have changed in shape or color.  Stay current with required vaccines (immunizations).  Influenza vaccine. All adults should be immunized every year.  Tetanus, diphtheria, and acellular pertussis (Td, Tdap) vaccine. Pregnant women should receive 1 dose of Tdap vaccine during each pregnancy. The dose should be obtained regardless of the length of time since the last dose. Immunization is preferred during the 27th-36th week of gestation. An adult who has not previously received Tdap or who does not know her vaccine status should receive 1 dose of Tdap. This initial dose should be followed by tetanus and diphtheria toxoids (Td) booster doses every 10 years. Adults with an unknown or incomplete history of completing a 3-dose immunization series with Td-containing vaccines should begin or complete a primary immunization series including a Tdap dose. Adults should receive a Td booster every 10 years.  Varicella vaccine. An adult without evidence of immunity to varicella should receive 2 doses or a second dose if she has previously received 1 dose. Pregnant females who do not have evidence of immunity should receive the first dose after pregnancy. This first dose should be obtained before leaving the health care facility. The second dose should be obtained 4-8 weeks after the first dose.  Human papillomavirus (HPV) vaccine. Females aged 13-26 years who have not received the vaccine previously should obtain the 3-dose series. The vaccine is not recommended for use in pregnant females. However, pregnancy testing is not needed before receiving a dose. If a female is found to be pregnant after receiving a dose, no treatment is needed. In that case, the remaining doses should be delayed until after the pregnancy. Immunization is recommended for any person with an immunocompromised condition through the age of 61 years if she did not get any or all doses earlier. During the  3-dose series, the second dose should be obtained 4-8 weeks after the first dose. The third dose should be obtained 24 weeks after the first dose and 16 weeks after the second dose.  Zoster vaccine. One dose is recommended for adults aged 30 years or older unless certain conditions are present.  Measles, mumps, and rubella (MMR) vaccine. Adults born  before 1957 generally are considered immune to measles and mumps. Adults born in 1957 or later should have 1 or more doses of MMR vaccine unless there is a contraindication to the vaccine or there is laboratory evidence of immunity to each of the three diseases. A routine second dose of MMR vaccine should be obtained at least 28 days after the first dose for students attending postsecondary schools, health care workers, or international travelers. People who received inactivated measles vaccine or an unknown type of measles vaccine during 1963-1967 should receive 2 doses of MMR vaccine. People who received inactivated mumps vaccine or an unknown type of mumps vaccine before 1979 and are at high risk for mumps infection should consider immunization with 2 doses of MMR vaccine. For females of childbearing age, rubella immunity should be determined. If there is no evidence of immunity, females who are not pregnant should be vaccinated. If there is no evidence of immunity, females who are pregnant should delay immunization until after pregnancy. Unvaccinated health care workers born before 1957 who lack laboratory evidence of measles, mumps, or rubella immunity or laboratory confirmation of disease should consider measles and mumps immunization with 2 doses of MMR vaccine or rubella immunization with 1 dose of MMR vaccine.  Pneumococcal 13-valent conjugate (PCV13) vaccine. When indicated, a person who is uncertain of his immunization history and has no record of immunization should receive the PCV13 vaccine. All adults 65 years of age and older should receive this  vaccine. An adult aged 19 years or older who has certain medical conditions and has not been previously immunized should receive 1 dose of PCV13 vaccine. This PCV13 should be followed with a dose of pneumococcal polysaccharide (PPSV23) vaccine. Adults who are at high risk for pneumococcal disease should obtain the PPSV23 vaccine at least 8 weeks after the dose of PCV13 vaccine. Adults older than 58 years of age who have normal immune system function should obtain the PPSV23 vaccine dose at least 1 year after the dose of PCV13 vaccine.  Pneumococcal polysaccharide (PPSV23) vaccine. When PCV13 is also indicated, PCV13 should be obtained first. All adults aged 65 years and older should be immunized. An adult younger than age 65 years who has certain medical conditions should be immunized. Any person who resides in a nursing home or long-term care facility should be immunized. An adult smoker should be immunized. People with an immunocompromised condition and certain other conditions should receive both PCV13 and PPSV23 vaccines. People with human immunodeficiency virus (HIV) infection should be immunized as soon as possible after diagnosis. Immunization during chemotherapy or radiation therapy should be avoided. Routine use of PPSV23 vaccine is not recommended for American Indians, [place] Natives, or people younger than 65 years unless there are medical conditions that require PPSV23 vaccine. When indicated, people who have unknown immunization and have no record of immunization should receive PPSV23 vaccine. One-time revaccination 5 years after the first dose of PPSV23 is recommended for people aged 19-64 years who have chronic kidney failure, nephrotic syndrome, asplenia, or immunocompromised conditions. People who received 1-2 doses of PPSV23 before age 65 years should receive another dose of PPSV23 vaccine at age 65 years or later if at least 5 years have passed since the previous dose. Doses of PPSV23 are not  needed for people immunized with PPSV23 at or after age 65 years.  Meningococcal vaccine. Adults with asplenia or persistent complement component deficiencies should receive 2 doses of quadrivalent meningococcal conjugate (MenACWY-D) vaccine. The doses should be obtained   at least 2 months apart. Microbiologists working with certain meningococcal bacteria, [place] recruits, people at risk during an outbreak, and people who travel to or live in countries with a high rate of meningitis should be immunized. A first-year college student up through age 34 years who is living in a residence hall should receive a dose if she did not receive a dose on or after her 16th birthday. Adults who have certain high-risk conditions should receive one or more doses of vaccine.  Hepatitis A vaccine. Adults who wish to be protected from this disease, have certain high-risk conditions, work with hepatitis A-infected animals, work in hepatitis A research labs, or travel to or work in countries with a high rate of hepatitis A should be immunized. Adults who were previously unvaccinated and who anticipate close contact with an international adoptee during the first 60 days after arrival in the [place] from a country with a high rate of hepatitis A should be immunized.  Hepatitis B vaccine. Adults who wish to be protected from this disease, have certain high-risk conditions, may be exposed to blood or other infectious body fluids, are household contacts or sex partners of hepatitis B positive people, are clients or workers in certain care facilities, or travel to or work in countries with a high rate of hepatitis B should be immunized.  Haemophilus influenzae type b (Hib) vaccine. A previously unvaccinated person with asplenia or sickle cell disease or having a scheduled splenectomy should receive 1 dose of Hib vaccine. Regardless of previous immunization, a recipient of a hematopoietic stem cell transplant should receive a  3-dose series 6-12 months after her successful transplant. Hib vaccine is not recommended for adults with HIV infection. Preventive Services / Frequency Ages 35 to 4 years  Blood pressure check.** / Every 3-5 years.  Lipid and cholesterol check.** / Every 5 years beginning at age 60.  Clinical breast exam.** / Every 3 years for women in their 71s and 10s.  BRCA-related cancer risk assessment.** / For women who have family members with a BRCA-related cancer (breast, ovarian, tubal, or peritoneal cancers).  Pap test.** / Every 2 years from ages 76 through 26. Every 3 years starting at age 61 through age 76 or 93 with a history of 3 consecutive normal Pap tests.  HPV screening.** / Every 3 years from ages 37 through ages 60 to 51 with a history of 3 consecutive normal Pap tests.  Hepatitis C blood test.** / For any individual with known risks for hepatitis C.  Skin self-exam. / Monthly.  Influenza vaccine. / Every year.  Tetanus, diphtheria, and acellular pertussis (Tdap, Td) vaccine.** / Consult your health care provider. Pregnant women should receive 1 dose of Tdap vaccine during each pregnancy. 1 dose of Td every 10 years.  Varicella vaccine.** / Consult your health care provider. Pregnant females who do not have evidence of immunity should receive the first dose after pregnancy.  HPV vaccine. / 3 doses over 6 months, if 93 and younger. The vaccine is not recommended for use in pregnant females. However, pregnancy testing is not needed before receiving a dose.  Measles, mumps, rubella (MMR) vaccine.** / You need at least 1 dose of MMR if you were born in 1957 or later. You may also need a 2nd dose. For females of childbearing age, rubella immunity should be determined. If there is no evidence of immunity, females who are not pregnant should be vaccinated. If there is no evidence of immunity, females who are  pregnant should delay immunization until after pregnancy.  Pneumococcal  13-valent conjugate (PCV13) vaccine.** / Consult your health care provider.  Pneumococcal polysaccharide (PPSV23) vaccine.** / 1 to 2 doses if you smoke cigarettes or if you have certain conditions.  Meningococcal vaccine.** / 1 dose if you are age 68 to 8 years and a Market researcher living in a residence hall, or have one of several medical conditions, you need to get vaccinated against meningococcal disease. You may also need additional booster doses.  Hepatitis A vaccine.** / Consult your health care provider.  Hepatitis B vaccine.** / Consult your health care provider.  Haemophilus influenzae type b (Hib) vaccine.** / Consult your health care provider. Ages 7 to 53 years  Blood pressure check.** / Every year.  Lipid and cholesterol check.** / Every 5 years beginning at age 25 years.  Lung cancer screening. / Every year if you are aged 11-80 years and have a 30-pack-year history of smoking and currently smoke or have quit within the past 15 years. Yearly screening is stopped once you have quit smoking for at least 15 years or develop a health problem that would prevent you from having lung cancer treatment.  Clinical breast exam.** / Every year after age 48 years.  BRCA-related cancer risk assessment.** / For women who have family members with a BRCA-related cancer (breast, ovarian, tubal, or peritoneal cancers).  Mammogram.** / Every year beginning at age 41 years and continuing for as long as you are in good health. Consult with your health care provider.  Pap test.** / Every 3 years starting at age 65 years through age 37 or 70 years with a history of 3 consecutive normal Pap tests.  HPV screening.** / Every 3 years from ages 72 years through ages 60 to 40 years with a history of 3 consecutive normal Pap tests.  Fecal occult blood test (FOBT) of stool. / Every year beginning at age 21 years and continuing until age 5 years. You may not need to do this test if you get  a colonoscopy every 10 years.  Flexible sigmoidoscopy or colonoscopy.** / Every 5 years for a flexible sigmoidoscopy or every 10 years for a colonoscopy beginning at age 35 years and continuing until age 48 years.  Hepatitis C blood test.** / For all people born from [birth year] through 1965 and any individual with known risks for hepatitis C.  Skin self-exam. / Monthly.  Influenza vaccine. / Every year.  Tetanus, diphtheria, and acellular pertussis (Tdap/Td) vaccine.** / Consult your health care provider. Pregnant women should receive 1 dose of Tdap vaccine during each pregnancy. 1 dose of Td every 10 years.  Varicella vaccine.** / Consult your health care provider. Pregnant females who do not have evidence of immunity should receive the first dose after pregnancy.  Zoster vaccine.** / 1 dose for adults aged 30 years or older.  Measles, mumps, rubella (MMR) vaccine.** / You need at least 1 dose of MMR if you were born in 1957 or later. You may also need a second dose. For females of childbearing age, rubella immunity should be determined. If there is no evidence of immunity, females who are not pregnant should be vaccinated. If there is no evidence of immunity, females who are pregnant should delay immunization until after pregnancy.  Pneumococcal 13-valent conjugate (PCV13) vaccine.** / Consult your health care provider.  Pneumococcal polysaccharide (PPSV23) vaccine.** / 1 to 2 doses if you smoke cigarettes or if you have certain conditions.  Meningococcal vaccine.** /  Consult your health care provider.  Hepatitis A vaccine.** / Consult your health care provider.  Hepatitis B vaccine.** / Consult your health care provider.  Haemophilus influenzae type b (Hib) vaccine.** / Consult your health care provider. Ages 65 years and over  Blood pressure check.** / Every year.  Lipid and cholesterol check.** / Every 5 years beginning at age 20 years.  Lung cancer screening. / Every year if you  are aged 55-80 years and have a 30-pack-year history of smoking and currently smoke or have quit within the past 15 years. Yearly screening is stopped once you have quit smoking for at least 15 years or develop a health problem that would prevent you from having lung cancer treatment.  Clinical breast exam.** / Every year after age 40 years.  BRCA-related cancer risk assessment.** / For women who have family members with a BRCA-related cancer (breast, ovarian, tubal, or peritoneal cancers).  Mammogram.** / Every year beginning at age 40 years and continuing for as long as you are in good health. Consult with your health care provider.  Pap test.** / Every 3 years starting at age 30 years through age 65 or 70 years with 3 consecutive normal Pap tests. Testing can be stopped between 65 and 70 years with 3 consecutive normal Pap tests and no abnormal Pap or HPV tests in the past 10 years.  HPV screening.** / Every 3 years from ages 30 years through ages 65 or 70 years with a history of 3 consecutive normal Pap tests. Testing can be stopped between 65 and 70 years with 3 consecutive normal Pap tests and no abnormal Pap or HPV tests in the past 10 years.  Fecal occult blood test (FOBT) of stool. / Every year beginning at age 50 years and continuing until age 75 years. You may not need to do this test if you get a colonoscopy every 10 years.  Flexible sigmoidoscopy or colonoscopy.** / Every 5 years for a flexible sigmoidoscopy or every 10 years for a colonoscopy beginning at age 50 years and continuing until age 75 years.  Hepatitis C blood test.** / For all people born from 1945 through 1965 and any individual with known risks for hepatitis C.  Osteoporosis screening.** / A one-time screening for women ages 65 years and over and women at risk for fractures or osteoporosis.  Skin self-exam. / Monthly.  Influenza vaccine. / Every year.  Tetanus, diphtheria, and acellular pertussis (Tdap/Td)  vaccine.** / 1 dose of Td every 10 years.  Varicella vaccine.** / Consult your health care provider.  Zoster vaccine.** / 1 dose for adults aged 60 years or older.  Pneumococcal 13-valent conjugate (PCV13) vaccine.** / Consult your health care provider.  Pneumococcal polysaccharide (PPSV23) vaccine.** / 1 dose for all adults aged 65 years and older.  Meningococcal vaccine.** / Consult your health care provider.  Hepatitis A vaccine.** / Consult your health care provider.  Hepatitis B vaccine.** / Consult your health care provider.  Haemophilus influenzae type b (Hib) vaccine.** / Consult your health care provider. ** Family history and personal history of risk and conditions may change your health care provider's recommendations.   This information is not intended to replace advice given to you by your health care provider. Make sure you discuss any questions you have with your health care provider.   Document Released: 12/16/2001 Document Revised: 11/10/2014 Document Reviewed: 03/17/2011 Elsevier Interactive Patient Education 2016 Elsevier Inc.  

## 2016-04-18 NOTE — Progress Notes (Signed)
Subjective:     Alicia Knapp is a 60 y.o. female and is here for a comprehensive physical exam. The patient reports no problems.  Social History   Social History  . Marital Status: Married    Spouse Name: N/A  . Number of Children: N/A  . Years of Education: N/A   Occupational History  . Not on file.   Social History Main Topics  . Smoking status: Former Smoker    Types: Cigarettes    Quit date: 09/17/2014  . Smokeless tobacco: Never Used  . Alcohol Use: No  . Drug Use: No  . Sexual Activity:    Partners: Male   Other Topics Concern  . Not on file   Social History Narrative   Health Maintenance  Topic Date Due  . Hepatitis C Screening  12/20/1956  . HIV Screening  12/21/1971  . TETANUS/TDAP  11/28/2015  . INFLUENZA VACCINE  06/03/2016  . MAMMOGRAM  02/21/2017  . PAP SMEAR  02/21/2018  . COLONOSCOPY  05/21/2018    The following portions of the patient's history were reviewed and updated as appropriate:  She  has a past medical history of Anxiety; Hypertension; and Postoperative nausea. She  does not have any pertinent problems on file. She  has past surgical history that includes Abdominal hysterectomy and Appendectomy. Her family history includes Arthritis in her maternal grandmother; Cancer in her father; Diabetes in her mother; Hypertension in her brother and sister; Stroke in her brother; Thyroid disease in her mother. She  reports that she quit smoking about 19 months ago. Her smoking use included Cigarettes. She has never used smokeless tobacco. She reports that she does not drink alcohol or use illicit drugs. She has a current medication list which includes the following prescription(s): amlodipine, aripiprazole, aspirin, atorvastatin, carvedilol, cholecalciferol, fluticasone, hydrochlorothiazide, levocetirizine, lorazepam, multivitamin, and valsartan. Current Outpatient Prescriptions on File Prior to Visit  Medication Sig Dispense Refill  . amLODipine  (NORVASC) 10 MG tablet Take 1 tablet (10 mg total) by mouth daily. 90 tablet 1  . ARIPiprazole (ABILIFY) 10 MG tablet Take 10 mg by mouth daily.      [name] aspirin 81 MG tablet Take 81 mg by mouth daily.      . carvedilol (COREG) 6.25 MG tablet Take 1 tablet (6.25 mg total) by mouth 2 (two) times daily with a meal. 180 tablet 1  . cholecalciferol (VITAMIN D) 1000 UNITS tablet Take 1,000 Units by mouth daily.    . hydrochlorothiazide (HYDRODIURIL) 25 MG tablet Take 1 tablet (25 mg total) by mouth daily. 90 tablet 1  . levocetirizine (XYZAL) 5 MG tablet Take 1 tablet (5 mg total) by mouth every evening. 30 tablet 5  . LORazepam (ATIVAN) 1 MG tablet Take 1 mg by mouth every 8 (eight) hours.      . Multiple Vitamin (MULTIVITAMIN) tablet Take 1 tablet by mouth daily.    . valsartan (DIOVAN) 80 MG tablet Take 1 tablet (80 mg total) by mouth daily. 90 tablet 3   No current facility-administered medications on file prior to visit.   She is allergic to erythromycin and penicillins..  Review of Systems Review of Systems  Constitutional: Negative for activity change, appetite change and fatigue.  HENT: Negative for hearing loss, congestion, tinnitus and ear discharge.  dentist q5m Eyes: Negative for visual disturbance (see optho q1y -- vision corrected to 20/20 with glasses).  Respiratory: Negative for cough, chest tightness and shortness of breath.   Cardiovascular: Negative for chest pain,  palpitations and leg swelling.  Gastrointestinal: Negative for abdominal pain, diarrhea, constipation and abdominal distention.  Genitourinary: Negative for urgency, frequency, decreased urine volume and difficulty urinating.  Musculoskeletal: Negative for back pain, arthralgias and gait problem.  Skin: Negative for color change, pallor and rash.  Neurological: Negative for dizziness, light-headedness, numbness and headaches.  Hematological: Negative for adenopathy. Does not bruise/bleed easily.   Psychiatric/Behavioral: Negative for suicidal ideas, confusion, sleep disturbance, self-injury, dysphoric mood, decreased concentration and agitation.       Objective:    BP 126/72 mmHg  Pulse 57  Temp(Src) 98.1 F (36.7 C) (Oral)  Ht 5\' 6"  (1.676 m)  Wt 199 lb (90.266 kg)  BMI 32.13 kg/m2  SpO2 96% General appearance: alert, cooperative, appears stated age and no distress Head: Normocephalic, without obvious abnormality, atraumatic Eyes: conjunctivae/corneas clear. PERRL, EOM's intact. Fundi benign. Ears: normal TM's and external ear canals both ears Nose: Nares normal. Septum midline. Mucosa normal. No drainage or sinus tenderness. Throat: lips, mucosa, and tongue normal; teeth and gums normal Neck: no adenopathy, no carotid bruit, no JVD, supple, symmetrical, trachea midline and thyroid not enlarged, symmetric, no tenderness/mass/nodules Back: symmetric, no curvature. ROM normal. No CVA tenderness. Lungs: clear to auscultation bilaterally Breasts: normal appearance, no masses or tenderness Heart: regular rate and rhythm, S1, S2 normal, no murmur, click, rub or gallop Abdomen: soft, non-tender; bowel sounds normal; no masses,  no organomegaly Pelvic: not indicated; status post hysterectomy, negative ROS Extremities: extremities normal, atraumatic, no cyanosis or edema Pulses: 2+ and symmetric Skin: Skin color, texture, turgor normal. No rashes or lesions Lymph nodes: Cervical, supraclavicular, and axillary nodes normal. Neurologic: Alert and oriented X 3, normal strength and tone. Normal symmetric reflexes. Normal coordination and gait     Assessment:    Healthy female exam.      Plan:    ghm utd Check labs See After Visit Summary for Counseling Recommendations    1. Hyperlipidemia   - atorvastatin (LIPITOR) 10 MG tablet; Take 1 tablet (10 mg total) by mouth daily.  Dispense: 90 tablet; Refill: 1 - TSH - POCT urinalysis dipstick - Lipid panel - CBC with  Differential/Platelet - Comprehensive metabolic panel  2. Seasonal allergies   - fluticasone (FLONASE) 50 MCG/ACT nasal spray; Place 2 sprays into both nostrils daily.  Dispense: 48 g; Refill: 2  3. Preventative health care   - TSH - POCT urinalysis dipstick - Lipid panel - CBC with Differential/Platelet - Comprehensive metabolic panel  4. Need for hepatitis C screening test   - Hepatitis C antibody  5. Need for diphtheria-tetanus-pertussis (Tdap) vaccine, adult/adolescent   - Tdap vaccine greater than or equal to 7yo IM

## 2016-04-19 LAB — HEPATITIS C ANTIBODY: HCV AB: NEGATIVE

## 2016-04-25 ENCOUNTER — Other Ambulatory Visit: Payer: Self-pay

## 2016-04-25 DIAGNOSIS — D72819 Decreased white blood cell count, unspecified: Secondary | ICD-10-CM

## 2016-05-18 ENCOUNTER — Other Ambulatory Visit: Payer: Self-pay | Admitting: Family Medicine

## 2016-05-19 NOTE — Telephone Encounter (Signed)
Rx sent to the pharmacy by e-script.//AB/CMA 

## 2016-06-05 ENCOUNTER — Other Ambulatory Visit: Payer: Self-pay | Admitting: Family Medicine

## 2016-06-05 DIAGNOSIS — I10 Essential (primary) hypertension: Secondary | ICD-10-CM

## 2016-08-19 ENCOUNTER — Other Ambulatory Visit: Payer: Self-pay

## 2016-08-19 ENCOUNTER — Telehealth: Payer: Self-pay | Admitting: Family Medicine

## 2016-08-19 MED ORDER — LEVOCETIRIZINE DIHYDROCHLORIDE 5 MG PO TABS: 5.0000 mg | ORAL_TABLET | Freq: Every evening | ORAL | 1 refills | Status: DC

## 2016-08-19 NOTE — Telephone Encounter (Signed)
Spoke with patient to inform her that her request for medication refill has been sent in, patient states she had no questions or concerns at this time.

## 2016-08-19 NOTE — Telephone Encounter (Signed)
Self. Pt is requesting a refill for levocetirizine Pt would like a 90 day supply due to her using mail order it is cheaper for her.     [facility]

## 2016-11-24 ENCOUNTER — Other Ambulatory Visit: Payer: Self-pay | Admitting: Family Medicine

## 2016-11-24 DIAGNOSIS — E785 Hyperlipidemia, unspecified: Secondary | ICD-10-CM

## 2016-11-24 MED ORDER — ATORVASTATIN CALCIUM 10 MG PO TABS: 10.0000 mg | ORAL_TABLET | Freq: Every day | ORAL | 0 refills | Status: DC

## 2016-11-24 NOTE — Telephone Encounter (Signed)
Sent atorvastatin to [facility] [address] [place]. Patient aware

## 2016-11-24 NOTE — Telephone Encounter (Signed)
Relation to [id number] Call back number:[phone] Pharmacy: [facility] [place] [phone] (Phone) [phone] (Fax)     Reason for call:  Patient requesting a refill atorvastatin (LIPITOR) 10 MG tablet

## 2016-11-24 NOTE — Telephone Encounter (Signed)
Pt called back in, wanting to know if provider is able to send her a few to her local pharmacy while she's waiting for Rx from mail order? She says that she only has one left.    Pharmacy requesting : [facility] on [address].

## 2016-12-15 ENCOUNTER — Encounter: Payer: Self-pay | Admitting: Family Medicine

## 2016-12-15 ENCOUNTER — Ambulatory Visit (INDEPENDENT_AMBULATORY_CARE_PROVIDER_SITE_OTHER): Admitting: Family Medicine

## 2016-12-15 ENCOUNTER — Other Ambulatory Visit: Payer: Self-pay | Admitting: Family Medicine

## 2016-12-15 VITALS — BP 142/70 | HR 61 | Temp 97.7°F | Resp 16 | Ht 65.9 in | Wt 204.2 lb

## 2016-12-15 DIAGNOSIS — R42 Dizziness and giddiness: Secondary | ICD-10-CM | POA: Diagnosis not present

## 2016-12-15 DIAGNOSIS — R8290 Unspecified abnormal findings in urine: Secondary | ICD-10-CM

## 2016-12-15 DIAGNOSIS — I10 Essential (primary) hypertension: Secondary | ICD-10-CM

## 2016-12-15 DIAGNOSIS — J302 Other seasonal allergic rhinitis: Secondary | ICD-10-CM | POA: Diagnosis not present

## 2016-12-15 DIAGNOSIS — E785 Hyperlipidemia, unspecified: Secondary | ICD-10-CM

## 2016-12-15 LAB — COMPREHENSIVE METABOLIC PANEL
ALT: 19 U/L (ref 0–35)
AST: 18 U/L (ref 0–37)
Albumin: 4.3 g/dL (ref 3.5–5.2)
Alkaline Phosphatase: 67 U/L (ref 39–117)
BILIRUBIN TOTAL: 0.5 mg/dL (ref 0.2–1.2)
BUN: 14 mg/dL (ref 6–23)
CHLORIDE: 104 meq/L (ref 96–112)
CO2: 30 meq/L (ref 19–32)
Calcium: 9.7 mg/dL (ref 8.4–10.5)
Creatinine, Ser: 0.84 mg/dL (ref 0.40–1.20)
GFR: 88.95 mL/min (ref >60.00)
Glucose, Bld: 101 mg/dL — ABNORMAL HIGH (ref 70–99)
Potassium: 4.2 mEq/L (ref 3.5–5.1)
Sodium: 139 mEq/L (ref 135–145)
Total Protein: 7.7 g/dL (ref 6.0–8.3)

## 2016-12-15 LAB — CBC WITH DIFFERENTIAL/PLATELET
BASOS ABS: 0 10*3/uL (ref 0.0–0.1)
BASOS PCT: 0.5 % (ref 0.0–3.0)
EOS PCT: 1.8 % (ref 0.0–5.0)
Eosinophils Absolute: 0.1 10*3/uL (ref 0.0–0.7)
HEMATOCRIT: 37.9 % (ref 36.0–46.0)
Hemoglobin: 12.5 g/dL (ref 12.0–15.0)
LYMPHS PCT: 38.9 % (ref 12.0–46.0)
Lymphs Abs: 1.5 10*3/uL (ref 0.7–4.0)
MCHC: 32.9 g/dL (ref 30.0–36.0)
MCV: 87.6 fl (ref 78.0–100.0)
MONOS PCT: 7.4 % (ref 3.0–12.0)
Monocytes Absolute: 0.3 10*3/uL (ref 0.1–1.0)
NEUTROS ABS: 1.9 10*3/uL (ref 1.4–7.7)
NEUTROS PCT: 51.4 % (ref 43.0–77.0)
PLATELETS: 335 10*3/uL (ref 150.0–400.0)
RBC: 4.33 Mil/uL (ref 3.87–5.11)
RDW: 13.8 % (ref 11.5–15.5)
WBC: 3.7 10*3/uL — ABNORMAL LOW (ref 4.0–10.5)

## 2016-12-15 LAB — POC URINALSYSI DIPSTICK (AUTOMATED)
BILIRUBIN UA: NEGATIVE
Glucose, UA: NEGATIVE
KETONES UA: NEGATIVE
Leukocytes, UA: NEGATIVE
Nitrite, UA: NEGATIVE
PH UA: 6.5
Protein, UA: NEGATIVE
RBC UA: NEGATIVE
Spec Grav, UA: 1.015
Urobilinogen, UA: NEGATIVE

## 2016-12-15 LAB — VITAMIN B12: Vitamin B-12: 233 pg/mL (ref 211–911)

## 2016-12-15 LAB — TSH: TSH: 1.16 u[IU]/mL (ref 0.35–4.50)

## 2016-12-15 LAB — LIPID PANEL
CHOL/HDL RATIO: 3
Cholesterol: 142 mg/dL (ref 0–200)
HDL: 46.2 mg/dL (ref >39.00)
LDL CALC: 81 mg/dL (ref 0–99)
NONHDL: 95.52
Triglycerides: 71 mg/dL (ref 0.0–149.0)
VLDL: 14.2 mg/dL (ref 0.0–40.0)

## 2016-12-15 MED ORDER — HYDROCHLOROTHIAZIDE 25 MG PO TABS: 25.0000 mg | ORAL_TABLET | Freq: Every day | ORAL | 1 refills | Status: DC

## 2016-12-15 MED ORDER — CARVEDILOL 6.25 MG PO TABS: 6.2500 mg | ORAL_TABLET | Freq: Two times a day (BID) | ORAL | 1 refills | Status: DC

## 2016-12-15 MED ORDER — ATORVASTATIN CALCIUM 10 MG PO TABS: 10.0000 mg | ORAL_TABLET | Freq: Every day | ORAL | 0 refills | Status: DC

## 2016-12-15 MED ORDER — ATORVASTATIN CALCIUM 10 MG PO TABS: 10.0000 mg | ORAL_TABLET | Freq: Every day | ORAL | 1 refills | Status: DC

## 2016-12-15 MED ORDER — VALSARTAN 80 MG PO TABS: 80.0000 mg | ORAL_TABLET | Freq: Every day | ORAL | 1 refills | Status: DC

## 2016-12-15 MED ORDER — AMLODIPINE BESYLATE 10 MG PO TABS: 10.0000 mg | ORAL_TABLET | Freq: Every day | ORAL | 1 refills | Status: DC

## 2016-12-15 MED ORDER — FLUTICASONE PROPIONATE 50 MCG/ACT NA SUSP: 2.0000 | Freq: Every day | NASAL | 2 refills | Status: DC

## 2016-12-15 MED ORDER — LEVOCETIRIZINE DIHYDROCHLORIDE 5 MG PO TABS: 5.0000 mg | ORAL_TABLET | Freq: Every evening | ORAL | 1 refills | Status: DC

## 2016-12-15 NOTE — Assessment & Plan Note (Signed)
con't lipitor Check labs 

## 2016-12-15 NOTE — Patient Instructions (Signed)
Epley Maneuver Self-Care WHAT IS THE EPLEY MANEUVER? The Epley maneuver is an exercise you can do to relieve symptoms of benign paroxysmal positional vertigo (BPPV). This condition is often just referred to as vertigo. BPPV is caused by the movement of tiny crystals (canaliths) inside your inner ear. The accumulation and movement of canaliths in your inner ear causes a sudden spinning sensation (vertigo) when you move your head to certain positions. Vertigo usually lasts about 30 seconds. BPPV usually occurs in just one ear. If you get vertigo when you lie on your left side, you probably have BPPV in your left ear. Your health care provider can tell you which ear is involved.  BPPV may be caused by a head injury. Many people older than 50 get BPPV for unknown reasons. If you have been diagnosed with BPPV, your health care provider may teach you how to do this maneuver. BPPV is not life threatening (benign) and usually goes away in time.  WHEN SHOULD I PERFORM THE EPLEY MANEUVER? You can do this maneuver at home whenever you have symptoms of vertigo. You may do the Epley maneuver up to 3 times a day until your symptoms of vertigo go away. HOW SHOULD I DO THE EPLEY MANEUVER? 1. Sit on the edge of a bed or table with your back straight. Your legs should be extended or hanging over the edge of the bed or table.  2. Turn your head halfway toward the affected ear.  3. Lie backward quickly with your head turned until you are lying flat on your back. You may want to position a pillow under your shoulders.  4. Hold this position for 30 seconds. You may experience an attack of vertigo. This is normal. Hold this position until the vertigo stops. 5. Then turn your head to the opposite direction until your unaffected ear is facing the floor.  6. Hold this position for 30 seconds. You may experience an attack of vertigo. This is normal. Hold this position until the vertigo stops. 7. Now turn your whole body to  the same side as your head. Hold for another 30 seconds.  8. You can then sit back up. ARE THERE RISKS TO THIS MANEUVER? In some cases, you may have other symptoms (such as changes in your vision, weakness, or numbness). If you have these symptoms, stop doing the maneuver and call your health care provider. Even if doing these maneuvers relieves your vertigo, you may still have dizziness. Dizziness is the sensation of light-headedness but without the sensation of movement. Even though the Epley maneuver may relieve your vertigo, it is possible that your symptoms will return within 5 years. WHAT SHOULD I DO AFTER THIS MANEUVER? After doing the Epley maneuver, you can return to your normal activities. Ask your doctor if there is anything you should do at home to prevent vertigo. This may include:  Sleeping with two or more pillows to keep your head elevated.  Not sleeping on the side of your affected ear.  Getting up slowly from bed.  Avoiding sudden movements during the day.  Avoiding extreme head movement, like looking up or bending over.  Wearing a cervical collar to prevent sudden head movements. WHAT SHOULD I DO IF MY SYMPTOMS GET WORSE? Call your health care provider if your vertigo gets worse. Call your provider right way if you have other symptoms, including:   Nausea.  Vomiting.  Headache.  Weakness.  Numbness.  Vision changes. This information is not intended to replace  you have other symptoms, including:   · Nausea.  · Vomiting.  · Headache.  · Weakness.  · Numbness.  · Vision changes.     This information is not intended to replace advice given to you by your health care provider. Make sure you discuss any questions you have with your health care provider.     Document Released: 10/25/2013 Document Reviewed: 10/25/2013  Elsevier Interactive Patient Education ©2017 [employer] Inc.

## 2016-12-15 NOTE — Progress Notes (Signed)
Patient ID: Alicia Knapp, female    DOB: 12/20/1954, 61 y.o.   MRN: [id number] I acted as a Education administrator for Dr. Carollee Herter.  Guerry Bruin, CMA  Chief Complaint  Patient presents with  . light headed    been about a week  . urine has smell    been about a week  . Medication Refill    HPI Patient is in today for feeling light headed, urine smells, and medication refills.  Has been feeling light headed for 1 week.  Urine smells first thing in the morning for 1 week. Pt psychiatrist is taking her off the ativan-- she is down to 2 a week and it started after she started coming off the ativan.  No palpitations, no cp or sob.  She will f/u with psych.  No congestion , no dysuria, or urinary frequency.    Past Medical History:  Diagnosis Date  . Anxiety   . Hypertension   . Postoperative nausea     Past Surgical History:  Procedure Laterality Date  . ABDOMINAL HYSTERECTOMY    . APPENDECTOMY      Family History  Problem Relation Age of Onset  . Cancer Father     prostate  . Arthritis Maternal Grandmother   . Diabetes Mother   . Thyroid disease Mother   . Hypertension Sister   . Hypertension Brother   . Stroke Brother     Social History   Social History  . Marital status: Married    Spouse name: N/A  . Number of children: N/A  . Years of education: N/A   Occupational History  . Not on file.   Social History Main Topics  . Smoking status: Former Smoker    Types: Cigarettes    Quit date: 09/17/2014  . Smokeless tobacco: Never Used  . Alcohol use No  . Drug use: No  . Sexual activity: Yes    Partners: Male   Other Topics Concern  . Not on file   Social History Narrative  . No narrative on file    Outpatient Medications Prior to Visit  Medication Sig Dispense Refill  . ARIPiprazole (ABILIFY) 10 MG tablet Take 10 mg by mouth daily.      [name] aspirin 81 MG tablet Take 81 mg by mouth daily.      . cholecalciferol (VITAMIN D) 1000 UNITS tablet Take 1,000 Units by  mouth daily.    [name] LORazepam (ATIVAN) 1 MG tablet Take 1 mg by mouth every 8 (eight) hours.      . Multiple Vitamin (MULTIVITAMIN) tablet Take 1 tablet by mouth daily.    [name] amLODipine (NORVASC) 10 MG tablet TAKE 1 TABLET DAILY 90 tablet 1  . atorvastatin (LIPITOR) 10 MG tablet TAKE 1 TABLET DAILY 90 tablet 1  . atorvastatin (LIPITOR) 10 MG tablet Take 1 tablet (10 mg total) by mouth daily. 30 tablet 0  . carvedilol (COREG) 6.25 MG tablet TAKE 1 TABLET TWICE A DAY WITH MEALS 180 tablet 1  . fluticasone (FLONASE) 50 MCG/ACT nasal spray Place 2 sprays into both nostrils daily. 48 g 2  . hydrochlorothiazide (HYDRODIURIL) 25 MG tablet TAKE 1 TABLET DAILY 90 tablet 1  . levocetirizine (XYZAL) 5 MG tablet Take 1 tablet (5 mg total) by mouth every evening. 90 tablet 1  . valsartan (DIOVAN) 80 MG tablet Take 1 tablet (80 mg total) by mouth daily. 90 tablet 3   No facility-administered medications prior to visit.  Allergies  Allergen Reactions  . Erythromycin     Causes stomach cramps  . Penicillins     rash    Review of Systems  Constitutional: Negative for fever and malaise/fatigue.  HENT: Negative for congestion.   Eyes: Negative for blurred vision.  Respiratory: Negative for cough and shortness of breath.   Cardiovascular: Negative for chest pain, palpitations and leg swelling.  Gastrointestinal: Negative for vomiting.  Musculoskeletal: Negative for back pain.  Skin: Negative for rash.  Neurological: Negative for loss of consciousness and headaches.       Objective:    Physical Exam  Constitutional: She is oriented to person, place, and time. She appears well-developed and well-nourished.  HENT:  Head: Normocephalic and atraumatic.  Eyes: Conjunctivae and EOM are normal.  Neck: Normal range of motion. Neck supple. No JVD present. Carotid bruit is not present. No thyromegaly present.  Cardiovascular: Normal rate, regular rhythm and normal heart sounds.   No murmur  heard. Pulmonary/Chest: Effort normal and breath sounds normal. No respiratory distress. She has no wheezes. She has no rales. She exhibits no tenderness.  Musculoskeletal: She exhibits no edema.  Neurological: She is alert and oriented to person, place, and time.  Psychiatric: She has a normal mood and affect.  Nursing note and vitals reviewed.   BP (!) 142/70 (BP Location: Left Arm, Cuff Size: Normal)   Pulse 61   Temp 97.7 F (36.5 C) (Oral)   Resp 16   Ht 5' 5.9" (1.674 m)   Wt 204 lb 3.2 oz (92.6 kg)   SpO2 97%   BMI 33.06 kg/m  Wt Readings from Last 3 Encounters:  12/15/16 204 lb 3.2 oz (92.6 kg)  04/18/16 199 lb (90.3 kg)  02/18/16 204 lb (92.5 kg)     Lab Results  Component Value Date   WBC 3.7 (L) 12/15/2016   HGB 12.5 12/15/2016   HCT 37.9 12/15/2016   PLT 335.0 12/15/2016   GLUCOSE 101 (H) 12/15/2016   CHOL 142 12/15/2016   TRIG 71.0 12/15/2016   HDL 46.20 12/15/2016   LDLDIRECT 140.5 08/12/2013   LDLCALC 81 12/15/2016   ALT 19 12/15/2016   AST 18 12/15/2016   NA 139 12/15/2016   K 4.2 12/15/2016   CL 104 12/15/2016   CREATININE 0.84 12/15/2016   BUN 14 12/15/2016   CO2 30 12/15/2016   TSH 1.16 12/15/2016   MICROALBUR <0.7 02/22/2015    Lab Results  Component Value Date   TSH 1.16 12/15/2016   Lab Results  Component Value Date   WBC 3.7 (L) 12/15/2016   HGB 12.5 12/15/2016   HCT 37.9 12/15/2016   MCV 87.6 12/15/2016   PLT 335.0 12/15/2016   Lab Results  Component Value Date   NA 139 12/15/2016   K 4.2 12/15/2016   CO2 30 12/15/2016   GLUCOSE 101 (H) 12/15/2016   BUN 14 12/15/2016   CREATININE 0.84 12/15/2016   BILITOT 0.5 12/15/2016   ALKPHOS 67 12/15/2016   AST 18 12/15/2016   ALT 19 12/15/2016   PROT 7.7 12/15/2016   ALBUMIN 4.3 12/15/2016   CALCIUM 9.7 12/15/2016   GFR 88.95 12/15/2016   Lab Results  Component Value Date   CHOL 142 12/15/2016   Lab Results  Component Value Date   HDL 46.20 12/15/2016   Lab Results   Component Value Date   LDLCALC 81 12/15/2016   Lab Results  Component Value Date   TRIG 71.0 12/15/2016   Lab Results  Component Value Date   CHOLHDL 3 12/15/2016   No results found for: HGBA1C     Assessment & Plan:   Problem List Items Addressed This Visit      Unprioritized   Essential hypertension    Stable con't coreg, diovan and hctz       Relevant Medications   hydrochlorothiazide (HYDRODIURIL) 25 MG tablet   carvedilol (COREG) 6.25 MG tablet   atorvastatin (LIPITOR) 10 MG tablet   amLODipine (NORVASC) 10 MG tablet   valsartan (DIOVAN) 80 MG tablet   atorvastatin (LIPITOR) 10 MG tablet   Other Relevant Orders   Comprehensive metabolic panel (Completed)   Hyperlipidemia LDL goal <100    con't lipitor Check labs      Relevant Medications   hydrochlorothiazide (HYDRODIURIL) 25 MG tablet   carvedilol (COREG) 6.25 MG tablet   atorvastatin (LIPITOR) 10 MG tablet   amLODipine (NORVASC) 10 MG tablet   valsartan (DIOVAN) 80 MG tablet   atorvastatin (LIPITOR) 10 MG tablet    Other Visit Diagnoses    Abnormal urine odor    -  Primary   Relevant Orders   POCT Urinalysis Dipstick (Automated) (Completed)   Hyperlipidemia, unspecified hyperlipidemia type       Relevant Medications   hydrochlorothiazide (HYDRODIURIL) 25 MG tablet   carvedilol (COREG) 6.25 MG tablet   atorvastatin (LIPITOR) 10 MG tablet   amLODipine (NORVASC) 10 MG tablet   valsartan (DIOVAN) 80 MG tablet   atorvastatin (LIPITOR) 10 MG tablet   Other Relevant Orders   Lipid panel (Completed)   Comprehensive metabolic panel (Completed)   Seasonal allergic rhinitis, unspecified chronicity, unspecified trigger       Relevant Medications   fluticasone (FLONASE) 50 MCG/ACT nasal spray   Dizzy spells       Relevant Orders   Comprehensive metabolic panel (Completed)   CBC with Differential/Platelet (Completed)   TSH (Completed)   Vitamin B12 (Completed)      I have changed Ms. Friday's  hydrochlorothiazide, carvedilol, atorvastatin, and amLODipine. I am also having her maintain her ARIPiprazole, aspirin, LORazepam, cholecalciferol, multivitamin, fluticasone, levocetirizine, valsartan, and atorvastatin.  Meds ordered this encounter  Medications  . hydrochlorothiazide (HYDRODIURIL) 25 MG tablet    Sig: Take 1 tablet (25 mg total) by mouth daily.    Dispense:  90 tablet    Refill:  1  . carvedilol (COREG) 6.25 MG tablet    Sig: Take 1 tablet (6.25 mg total) by mouth 2 (two) times daily with a meal.    Dispense:  180 tablet    Refill:  1  . atorvastatin (LIPITOR) 10 MG tablet    Sig: Take 1 tablet (10 mg total) by mouth daily.    Dispense:  90 tablet    Refill:  1  . amLODipine (NORVASC) 10 MG tablet    Sig: Take 1 tablet (10 mg total) by mouth daily.    Dispense:  90 tablet    Refill:  1  . fluticasone (FLONASE) 50 MCG/ACT nasal spray    Sig: Place 2 sprays into both nostrils daily.    Dispense:  48 g    Refill:  2  . levocetirizine (XYZAL) 5 MG tablet    Sig: Take 1 tablet (5 mg total) by mouth every evening.    Dispense:  90 tablet    Refill:  1  . valsartan (DIOVAN) 80 MG tablet    Sig: Take 1 tablet (80 mg total) by mouth daily.    Dispense:  90 tablet    Refill:  1  . atorvastatin (LIPITOR) 10 MG tablet    Sig: Take 1 tablet (10 mg total) by mouth daily.    Dispense:  30 tablet    Refill:  0    CMA served as scribe during this visit. History, Physical and Plan performed by medical provider. Documentation and orders reviewed and attested to.  Ann Held, DO

## 2016-12-15 NOTE — Progress Notes (Signed)
Pre visit review using our clinic review tool, if applicable. No additional management support is needed unless otherwise documented below in the visit note. 

## 2016-12-15 NOTE — Assessment & Plan Note (Signed)
Stable con't coreg, diovan and hctz

## 2016-12-16 ENCOUNTER — Other Ambulatory Visit: Payer: Self-pay | Admitting: Family Medicine

## 2016-12-16 DIAGNOSIS — I10 Essential (primary) hypertension: Secondary | ICD-10-CM

## 2016-12-16 MED ORDER — AMLODIPINE BESYLATE 10 MG PO TABS: 10.0000 mg | ORAL_TABLET | Freq: Every day | ORAL | 1 refills | Status: DC

## 2016-12-16 MED ORDER — HYDROCHLOROTHIAZIDE 25 MG PO TABS: 25.0000 mg | ORAL_TABLET | Freq: Every day | ORAL | 1 refills | Status: DC

## 2016-12-16 MED ORDER — ATORVASTATIN CALCIUM 10 MG PO TABS: 10.0000 mg | ORAL_TABLET | Freq: Every day | ORAL | 1 refills | Status: DC

## 2016-12-16 MED ORDER — CARVEDILOL 6.25 MG PO TABS: 6.2500 mg | ORAL_TABLET | Freq: Two times a day (BID) | ORAL | 1 refills | Status: DC

## 2016-12-22 ENCOUNTER — Other Ambulatory Visit: Payer: Self-pay | Admitting: Family Medicine

## 2016-12-22 DIAGNOSIS — E538 Deficiency of other specified B group vitamins: Secondary | ICD-10-CM

## 2016-12-23 ENCOUNTER — Ambulatory Visit (INDEPENDENT_AMBULATORY_CARE_PROVIDER_SITE_OTHER): Admitting: Behavioral Health

## 2016-12-23 DIAGNOSIS — E538 Deficiency of other specified B group vitamins: Secondary | ICD-10-CM | POA: Diagnosis not present

## 2016-12-23 MED ORDER — CYANOCOBALAMIN 1000 MCG/ML IJ SOLN
1000.0000 ug | Freq: Once | INTRAMUSCULAR | Status: AC
  Administered 2016-12-23: 1000 ug via INTRAMUSCULAR

## 2016-12-23 NOTE — Progress Notes (Signed)
Pre visit review using our clinic review tool, if applicable. No additional management support is needed unless otherwise documented below in the visit note.  Patient in clinic today for B12 injection. IM given in Left Deltoid. Patient tolerated injection well.  Next appointment 12/30/16 at 9:45 AM.

## 2016-12-30 ENCOUNTER — Ambulatory Visit (INDEPENDENT_AMBULATORY_CARE_PROVIDER_SITE_OTHER): Admitting: Behavioral Health

## 2016-12-30 DIAGNOSIS — E538 Deficiency of other specified B group vitamins: Secondary | ICD-10-CM | POA: Diagnosis not present

## 2016-12-30 MED ORDER — CYANOCOBALAMIN 1000 MCG/ML IJ SOLN
1000.0000 ug | Freq: Once | INTRAMUSCULAR | Status: AC
  Administered 2016-12-30: 1000 ug via INTRAMUSCULAR

## 2016-12-30 NOTE — Progress Notes (Signed)
Pre visit review using our clinic review tool, if applicable. No additional management support is needed unless otherwise documented below in the visit note.  Patient came in office today for B12 injection #2. IM given in Left Deltoid. Patient tolerated injection well.  Next appointment scheduled for 01/06/17 at 10:00 AM.

## 2017-01-06 ENCOUNTER — Ambulatory Visit (INDEPENDENT_AMBULATORY_CARE_PROVIDER_SITE_OTHER)

## 2017-01-06 DIAGNOSIS — E538 Deficiency of other specified B group vitamins: Secondary | ICD-10-CM

## 2017-01-06 MED ORDER — CYANOCOBALAMIN 1000 MCG/ML IJ SOLN
1000.0000 ug | Freq: Once | INTRAMUSCULAR | Status: AC
  Administered 2017-01-06: 1000 ug via INTRAMUSCULAR

## 2017-01-06 NOTE — Progress Notes (Signed)
Pre visit review using our clinic tool,if applicable. No additional management support is needed unless otherwise documented below in the visit note.   Patient in for 3rd B12 injection per order from Dr. Carollee Herter due to B12 deficiency.  Given IM right deltoid. Patient tolerated well. No complaints voiced.   Return appointment given for 1 week.

## 2017-01-13 ENCOUNTER — Ambulatory Visit

## 2017-01-20 ENCOUNTER — Telehealth: Payer: Self-pay

## 2017-01-20 ENCOUNTER — Ambulatory Visit (INDEPENDENT_AMBULATORY_CARE_PROVIDER_SITE_OTHER)

## 2017-01-20 DIAGNOSIS — E538 Deficiency of other specified B group vitamins: Secondary | ICD-10-CM

## 2017-01-20 MED ORDER — CYANOCOBALAMIN 1000 MCG/ML IJ SOLN
1000.0000 ug | Freq: Once | INTRAMUSCULAR | Status: AC
  Administered 2017-01-20: 1000 ug via INTRAMUSCULAR

## 2017-01-20 NOTE — Telephone Encounter (Signed)
Patient in today foe 4th B12 injection. She would like to know if she is to have labs drawn at this point and if she will go to B12 injections monthly based on lab report.

## 2017-01-20 NOTE — Telephone Encounter (Signed)
Appointment scheduled for 01/23/17 at 9:00 am for B12. Order placed.

## 2017-01-20 NOTE — Telephone Encounter (Signed)
Please draw b12

## 2017-01-20 NOTE — Addendum Note (Signed)
Addended by: Bunnie Domino on: 01/20/2017 01:51 PM   Modules accepted: Orders

## 2017-01-20 NOTE — Progress Notes (Signed)
Pre visit review using our clinic tool,if applicable. No additional management support is needed unless otherwise documented below in the visit note.   Patient in for 4th B12 injection. Given 1000 mcg  IM Right deltoid. Patient tolerated well. Will speak with Dr. Carollee Herter regarding labs and next B12 appointment. Patient notified.

## 2017-01-23 ENCOUNTER — Other Ambulatory Visit (INDEPENDENT_AMBULATORY_CARE_PROVIDER_SITE_OTHER)

## 2017-01-23 DIAGNOSIS — E538 Deficiency of other specified B group vitamins: Secondary | ICD-10-CM | POA: Diagnosis not present

## 2017-01-23 DIAGNOSIS — D72819 Decreased white blood cell count, unspecified: Secondary | ICD-10-CM

## 2017-01-23 LAB — VITAMIN B12: Vitamin B-12: 569 pg/mL (ref 211–911)

## 2017-01-23 LAB — CBC WITH DIFFERENTIAL/PLATELET
BASOS ABS: 0 10*3/uL (ref 0.0–0.1)
Basophils Relative: 1.2 % (ref 0.0–3.0)
Eosinophils Absolute: 0.1 10*3/uL (ref 0.0–0.7)
Eosinophils Relative: 2.7 % (ref 0.0–5.0)
HEMATOCRIT: 37.4 % (ref 36.0–46.0)
HEMOGLOBIN: 12.5 g/dL (ref 12.0–15.0)
LYMPHS PCT: 37 % (ref 12.0–46.0)
Lymphs Abs: 1.2 10*3/uL (ref 0.7–4.0)
MCHC: 33.5 g/dL (ref 30.0–36.0)
MCV: 87.1 fl (ref 78.0–100.0)
MONOS PCT: 7.9 % (ref 3.0–12.0)
Monocytes Absolute: 0.3 10*3/uL (ref 0.1–1.0)
NEUTROS ABS: 1.6 10*3/uL (ref 1.4–7.7)
Neutrophils Relative %: 51.2 % (ref 43.0–77.0)
PLATELETS: 316 10*3/uL (ref 150.0–400.0)
RBC: 4.29 Mil/uL (ref 3.87–5.11)
RDW: 13.8 % (ref 11.5–15.5)
WBC: 3.2 10*3/uL — ABNORMAL LOW (ref 4.0–10.5)

## 2017-05-21 ENCOUNTER — Telehealth: Payer: Self-pay | Admitting: Family Medicine

## 2017-05-21 NOTE — Telephone Encounter (Signed)
Pt called in to be advised by PCP . She said that her mail order informed her that her BP medication has been recalled. She would like a call back to be advised further.     CB: [phone]

## 2017-05-22 MED ORDER — LOSARTAN POTASSIUM 50 MG PO TABS: 50.0000 mg | ORAL_TABLET | Freq: Every day | ORAL | 1 refills | Status: DC

## 2017-05-22 MED ORDER — LOSARTAN POTASSIUM 50 MG PO TABS: 50.0000 mg | ORAL_TABLET | Freq: Every day | ORAL | 0 refills | Status: DC

## 2017-05-22 NOTE — Telephone Encounter (Signed)
Changed rx, sent #30 to local pharmacy, the rest will go to mail order.  Patient scheduled for bp check in 2 weeks.   PC

## 2017-05-22 NOTE — Telephone Encounter (Signed)
Losartan 50 mg 1 po qd #90  1 refills

## 2017-05-22 NOTE — Telephone Encounter (Signed)
Please advise which med and dose to switch to.   PC

## 2017-06-05 ENCOUNTER — Ambulatory Visit (INDEPENDENT_AMBULATORY_CARE_PROVIDER_SITE_OTHER): Admitting: Family Medicine

## 2017-06-05 DIAGNOSIS — I10 Essential (primary) hypertension: Secondary | ICD-10-CM | POA: Diagnosis not present

## 2017-06-05 NOTE — Progress Notes (Signed)
Noted. Agree w above.

## 2017-06-05 NOTE — Progress Notes (Signed)
Pre visit review using our clinic tool,if applicable. No additional management support is needed unless otherwise documented below in the visit note.   Patient in for BP check per order from Dr. Lawson Radar. Medicatiin changed form Valsartan to Losartan due to recall.  Patient states she has taken medications today. No complaints voiced.  BP today = 130/78 P=67  Per Dr. Nani Ravens, DOD patient to continue medication as ordered  And follow up with Dr. Carollee Herter as needed. Patient advised.

## 2017-06-18 ENCOUNTER — Telehealth: Payer: Self-pay | Admitting: Family Medicine

## 2017-06-18 DIAGNOSIS — I10 Essential (primary) hypertension: Secondary | ICD-10-CM

## 2017-06-18 DIAGNOSIS — E785 Hyperlipidemia, unspecified: Secondary | ICD-10-CM

## 2017-06-18 MED ORDER — CARVEDILOL 6.25 MG PO TABS: 6.2500 mg | ORAL_TABLET | Freq: Two times a day (BID) | ORAL | 0 refills | Status: DC

## 2017-06-18 MED ORDER — ATORVASTATIN CALCIUM 10 MG PO TABS
10.0000 mg | ORAL_TABLET | Freq: Every day | ORAL | 0 refills | Status: DC
Start: 2017-06-18 — End: 2017-09-15

## 2017-06-18 MED ORDER — HYDROCHLOROTHIAZIDE 25 MG PO TABS: 25.0000 mg | ORAL_TABLET | Freq: Every day | ORAL | 0 refills | Status: DC

## 2017-06-18 MED ORDER — ATORVASTATIN CALCIUM 10 MG PO TABS: 10.0000 mg | ORAL_TABLET | Freq: Every day | ORAL | 0 refills | Status: DC

## 2017-06-18 MED ORDER — LEVOCETIRIZINE DIHYDROCHLORIDE 5 MG PO TABS: 5.0000 mg | ORAL_TABLET | Freq: Every evening | ORAL | 0 refills | Status: DC

## 2017-06-18 NOTE — Telephone Encounter (Signed)
Caller name: Kailani Brass Relationship to patient: self Can be reached: [phone] Pharmacy: [facility], [place]  Reason for call: Pt is out of lipitor. Please send 30 day to local pharmacy and 90 day to mail order [facility]. Pt is scheduled for CPE 09/07/17. Pt states she called [facility] and they will fax refill request for other meds needed.

## 2017-06-18 NOTE — Telephone Encounter (Signed)
rx sent in and patient notified. 

## 2017-08-05 ENCOUNTER — Other Ambulatory Visit: Payer: Self-pay | Admitting: Family Medicine

## 2017-08-05 DIAGNOSIS — Z1231 Encounter for screening mammogram for malignant neoplasm of breast: Secondary | ICD-10-CM

## 2017-08-14 ENCOUNTER — Ambulatory Visit
Admission: RE | Admit: 2017-08-14 | Discharge: 2017-08-14 | Disposition: A | Discharge status: Home / Self Care | Source: Ambulatory Visit | Attending: Family Medicine | Admitting: Family Medicine

## 2017-08-14 DIAGNOSIS — Z1231 Encounter for screening mammogram for malignant neoplasm of breast: Secondary | ICD-10-CM

## 2017-09-07 ENCOUNTER — Encounter: Admitting: Family Medicine

## 2017-09-15 ENCOUNTER — Ambulatory Visit (INDEPENDENT_AMBULATORY_CARE_PROVIDER_SITE_OTHER): Admitting: Family Medicine

## 2017-09-15 VITALS — BP 132/82 | HR 57 | Temp 98.1°F | Wt 211.2 lb

## 2017-09-15 DIAGNOSIS — E538 Deficiency of other specified B group vitamins: Secondary | ICD-10-CM | POA: Diagnosis not present

## 2017-09-15 DIAGNOSIS — I10 Essential (primary) hypertension: Secondary | ICD-10-CM

## 2017-09-15 DIAGNOSIS — E785 Hyperlipidemia, unspecified: Secondary | ICD-10-CM

## 2017-09-15 DIAGNOSIS — Z0000 Encounter for general adult medical examination without abnormal findings: Secondary | ICD-10-CM | POA: Diagnosis not present

## 2017-09-15 LAB — COMPREHENSIVE METABOLIC PANEL
ALBUMIN: 4.4 g/dL (ref 3.5–5.2)
ALK PHOS: 67 U/L (ref 39–117)
ALT: 17 U/L (ref 0–35)
AST: 17 U/L (ref 0–37)
BILIRUBIN TOTAL: 0.6 mg/dL (ref 0.2–1.2)
BUN: 9 mg/dL (ref 6–23)
CO2: 31 mEq/L (ref 19–32)
CREATININE: 0.88 mg/dL (ref 0.40–1.20)
Calcium: 10.2 mg/dL (ref 8.4–10.5)
Chloride: 102 mEq/L (ref 96–112)
GFR: 84.37 mL/min (ref >60.00)
GLUCOSE: 104 mg/dL — AB (ref 70–99)
Potassium: 4.4 mEq/L (ref 3.5–5.1)
SODIUM: 138 meq/L (ref 135–145)
TOTAL PROTEIN: 8 g/dL (ref 6.0–8.3)

## 2017-09-15 LAB — POC URINALSYSI DIPSTICK (AUTOMATED)
BILIRUBIN UA: NEGATIVE
Glucose, UA: NEGATIVE
KETONES UA: NEGATIVE
Leukocytes, UA: NEGATIVE
Nitrite, UA: NEGATIVE
PH UA: 6 (ref 5.0–8.0)
Protein, UA: NEGATIVE
RBC UA: NEGATIVE
SPEC GRAV UA: 1.02 (ref 1.010–1.025)
Urobilinogen, UA: 0.2 E.U./dL

## 2017-09-15 LAB — CBC WITH DIFFERENTIAL/PLATELET
BASOS ABS: 0 10*3/uL (ref 0.0–0.1)
BASOS PCT: 0.9 % (ref 0.0–3.0)
EOS ABS: 0.1 10*3/uL (ref 0.0–0.7)
Eosinophils Relative: 2.8 % (ref 0.0–5.0)
HEMATOCRIT: 39.3 % (ref 36.0–46.0)
HEMOGLOBIN: 12.7 g/dL (ref 12.0–15.0)
LYMPHS PCT: 43.3 % (ref 12.0–46.0)
Lymphs Abs: 1.6 10*3/uL (ref 0.7–4.0)
MCHC: 32.3 g/dL (ref 30.0–36.0)
MCV: 88.7 fl (ref 78.0–100.0)
MONOS PCT: 7.2 % (ref 3.0–12.0)
Monocytes Absolute: 0.3 10*3/uL (ref 0.1–1.0)
Neutro Abs: 1.7 10*3/uL (ref 1.4–7.7)
Neutrophils Relative %: 45.8 % (ref 43.0–77.0)
Platelets: 349 10*3/uL (ref 150.0–400.0)
RBC: 4.43 Mil/uL (ref 3.87–5.11)
RDW: 14.3 % (ref 11.5–15.5)
WBC: 3.6 10*3/uL — AB (ref 4.0–10.5)

## 2017-09-15 LAB — LIPID PANEL
CHOL/HDL RATIO: 3
Cholesterol: 133 mg/dL (ref 0–200)
HDL: 44.1 mg/dL (ref >39.00)
LDL Cholesterol: 74 mg/dL (ref 0–99)
NonHDL: 88.42
Triglycerides: 70 mg/dL (ref 0.0–149.0)
VLDL: 14 mg/dL (ref 0.0–40.0)

## 2017-09-15 LAB — VITAMIN B12: Vitamin B-12: >1500 pg/mL — ABNORMAL HIGH (ref 211–911)

## 2017-09-15 LAB — TSH: TSH: 1.81 u[IU]/mL (ref 0.35–4.50)

## 2017-09-15 MED ORDER — ATORVASTATIN CALCIUM 10 MG PO TABS: 10.0000 mg | ORAL_TABLET | Freq: Every day | ORAL | 1 refills | Status: DC

## 2017-09-15 MED ORDER — AMLODIPINE BESYLATE 10 MG PO TABS: 10.0000 mg | ORAL_TABLET | Freq: Every day | ORAL | 1 refills | Status: DC

## 2017-09-15 MED ORDER — ATORVASTATIN CALCIUM 10 MG PO TABS: 10.0000 mg | ORAL_TABLET | Freq: Every day | ORAL | 0 refills | Status: DC

## 2017-09-15 NOTE — Assessment & Plan Note (Signed)
Well controlled, no changes to meds. Encouraged heart healthy diet such as the DASH diet and exercise as tolerated.  °

## 2017-09-15 NOTE — Assessment & Plan Note (Signed)
Tolerating statin, encouraged heart healthy diet, avoid trans fats, minimize simple carbs and saturated fats. Increase exercise as tolerated 

## 2017-09-15 NOTE — Patient Instructions (Addendum)
For ears--- try debrox or peroxide ---return if it is no better     Preventive Care 40-64 Years, Female Preventive care refers to lifestyle choices and visits with your health care provider that can promote health and wellness. What does preventive care include?  A yearly physical exam. This is also called an annual well check.  Dental exams once or twice a year.  Routine eye exams. Ask your health care provider how often you should have your eyes checked.  Personal lifestyle choices, including: ? Daily care of your teeth and gums. ? Regular physical activity. ? Eating a healthy diet. ? Avoiding tobacco and drug use. ? Limiting alcohol use. ? Practicing safe sex. ? Taking low-dose aspirin daily starting at age 50. ? Taking vitamin and mineral supplements as recommended by your health care provider. What happens during an annual well check? The services and screenings done by your health care provider during your annual well check will depend on your age, overall health, lifestyle risk factors, and family history of disease. Counseling Your health care provider may ask you questions about your:  Alcohol use.  Tobacco use.  Drug use.  Emotional well-being.  Home and relationship well-being.  Sexual activity.  Eating habits.  Work and work environment.  Method of birth control.  Menstrual cycle.  Pregnancy history.  Screening You may have the following tests or measurements:  Height, weight, and BMI.  Blood pressure.  Lipid and cholesterol levels. These may be checked every 5 years, or more frequently if you are over 50 years old.  Skin check.  Lung cancer screening. You may have this screening every year starting at age 55 if you have a 30-pack-year history of smoking and currently smoke or have quit within the past 15 years.  Fecal occult blood test (FOBT) of the stool. You may have this test every year starting at age 50.  Flexible sigmoidoscopy or  colonoscopy. You may have a sigmoidoscopy every 5 years or a colonoscopy every 10 years starting at age 50.  Hepatitis C blood test.  Hepatitis B blood test.  Sexually transmitted disease (STD) testing.  Diabetes screening. This is done by checking your blood sugar (glucose) after you have not eaten for a while (fasting). You may have this done every 1-3 years.  Mammogram. This may be done every 1-2 years. Talk to your health care provider about when you should start having regular mammograms. This may depend on whether you have a family history of breast cancer.  BRCA-related cancer screening. This may be done if you have a family history of breast, ovarian, tubal, or peritoneal cancers.  Pelvic exam and Pap test. This may be done every 3 years starting at age 21. Starting at age 30, this may be done every 5 years if you have a Pap test in combination with an HPV test.  Bone density scan. This is done to screen for osteoporosis. You may have this scan if you are at high risk for osteoporosis.  Discuss your test results, treatment options, and if necessary, the need for more tests with your health care provider. Vaccines Your health care provider may recommend certain vaccines, such as:  Influenza vaccine. This is recommended every year.  Tetanus, diphtheria, and acellular pertussis (Tdap, Td) vaccine. You may need a Td booster every 10 years.  Varicella vaccine. You may need this if you have not been vaccinated.  Zoster vaccine. You may need this after age 60.  Measles, mumps, and rubella (  MMR) vaccine. You may need at least one dose of MMR if you were born in 1957 or later. You may also need a second dose.  Pneumococcal 13-valent conjugate (PCV13) vaccine. You may need this if you have certain conditions and were not previously vaccinated.  Pneumococcal polysaccharide (PPSV23) vaccine. You may need one or two doses if you smoke cigarettes or if you have certain  conditions.  Meningococcal vaccine. You may need this if you have certain conditions.  Hepatitis A vaccine. You may need this if you have certain conditions or if you travel or work in places where you may be exposed to hepatitis A.  Hepatitis B vaccine. You may need this if you have certain conditions or if you travel or work in places where you may be exposed to hepatitis B.  Haemophilus influenzae type b (Hib) vaccine. You may need this if you have certain conditions.  Talk to your health care provider about which screenings and vaccines you need and how often you need them. This information is not intended to replace advice given to you by your health care provider. Make sure you discuss any questions you have with your health care provider. Document Released: 11/16/2015 Document Revised: 07/09/2016 Document Reviewed: 08/21/2015 Elsevier Interactive Patient Education  2017 [employer].

## 2017-09-15 NOTE — Progress Notes (Signed)
Subjective:     Alicia Knapp is a 61 y.o. female and is here for a comprehensive physical exam. The patient reports no problems.  Social History   Socioeconomic History  . Marital status: Married    Spouse name: Not on file  . Number of children: Not on file  . Years of education: Not on file  . Highest education level: Not on file  Social Needs  . Financial resource strain: Not on file  . Food insecurity - worry: Not on file  . Food insecurity - inability: Not on file  . Transportation needs - medical: Not on file  . Transportation needs - non-medical: Not on file  Occupational History  . Not on file  Tobacco Use  . Smoking status: Former Smoker    Types: Cigarettes    Last attempt to quit: 09/17/2014    Years since quitting: 3.0  . Smokeless tobacco: Never Used  Substance and Sexual Activity  . Alcohol use: No    Alcohol/week: 0.0 oz  . Drug use: No  . Sexual activity: Yes    Partners: Male  Other Topics Concern  . Not on file  Social History Narrative  . Not on file   Health Maintenance  Topic Date Due  . HIV Screening  12/20/1972  . INFLUENZA VACCINE  02/01/2027 (Originally 06/03/2017)  . PAP SMEAR  02/21/2018  . COLONOSCOPY  05/21/2018  . MAMMOGRAM  08/15/2019  . TETANUS/TDAP  04/18/2026  . Hepatitis C Screening  Completed    The following portions of the patient's history were reviewed and updated as appropriate:  She  has a past medical history of Anxiety, Hypertension, and Postoperative nausea. She does not have any pertinent problems on file. She  has a past surgical history that includes Abdominal hysterectomy and Appendectomy. Her family history includes Arthritis in her maternal grandmother; Cancer in her father; Diabetes in her mother; Hypertension in her brother and sister; Stroke in her brother; Thyroid disease in her mother. She  reports that she quit smoking about 3 years ago. Her smoking use included cigarettes. she has never used smokeless tobacco.  She reports that she does not drink alcohol or use drugs. She has a current medication list which includes the following prescription(s): amlodipine, aripiprazole, aspirin, atorvastatin, carvedilol, cholecalciferol, vitamin b 12, fluticasone, hydrochlorothiazide, levocetirizine, lorazepam, losartan, and multivitamin. Current Outpatient Medications on File Prior to Visit  Medication Sig Dispense Refill  . ARIPiprazole (ABILIFY) 10 MG tablet Take 10 mg by mouth daily.      [name] aspirin 81 MG tablet Take 81 mg by mouth daily.      . carvedilol (COREG) 6.25 MG tablet Take 1 tablet (6.25 mg total) by mouth 2 (two) times daily with a meal. 180 tablet 0  . cholecalciferol (VITAMIN D) 1000 UNITS tablet Take 1,000 Units by mouth daily.    . Cyanocobalamin (VITAMIN B 12) 100 MCG LOZG Take by mouth.    . fluticasone (FLONASE) 50 MCG/ACT nasal spray Place 2 sprays into both nostrils daily. 48 g 2  . hydrochlorothiazide (HYDRODIURIL) 25 MG tablet Take 1 tablet (25 mg total) by mouth daily. 90 tablet 0  . levocetirizine (XYZAL) 5 MG tablet Take 1 tablet (5 mg total) by mouth every evening. 90 tablet 0  . LORazepam (ATIVAN) 1 MG tablet Take 1 mg by mouth every 8 (eight) hours.      [name] losartan (COZAAR) 50 MG tablet Take 1 tablet (50 mg total) by mouth daily. 30 tablet 0  .  Multiple Vitamin (MULTIVITAMIN) tablet Take 1 tablet by mouth daily.     No current facility-administered medications on file prior to visit.    She is allergic to erythromycin and penicillins..  Review of Systems Review of Systems  Constitutional: Negative for activity change, appetite change and fatigue.  HENT: Negative for hearing loss, congestion, tinnitus and ear discharge.  dentist q38m Eyes: Negative for visual disturbance opth-due   Respiratory: Negative for cough, chest tightness and shortness of breath.   Cardiovascular: Negative for chest pain, palpitations and leg swelling.  Gastrointestinal: Negative for abdominal pain,  diarrhea, constipation and abdominal distention.  Genitourinary: Negative for urgency, frequency, decreased urine volume and difficulty urinating.  Musculoskeletal: Negative for back pain, arthralgias and gait problem.  Skin: Negative for color change, pallor and rash.  Neurological: Negative for dizziness, light-headedness, numbness and headaches.  Hematological: Negative for adenopathy. Does not bruise/bleed easily.  Psychiatric/Behavioral: Negative for suicidal ideas, confusion, sleep disturbance, self-injury, dysphoric mood, decreased concentration and agitation.       Objective:    BP 132/82   Pulse (!) 57   Temp 98.1 F (36.7 C) (Oral)   Wt 211 lb 3.2 oz (95.8 kg)   SpO2 99%   BMI 34.19 kg/m  General appearance: alert, cooperative, appears stated age and no distress Head: Normocephalic, without obvious abnormality, atraumatic Eyes: conjunctivae/corneas clear. PERRL, EOM's intact. Fundi benign. Ears: normal TM's and external ear canals both ears Nose: Nares normal. Septum midline. Mucosa normal. No drainage or sinus tenderness. Throat: lips, mucosa, and tongue normal; teeth and gums normal Neck: no adenopathy, no carotid bruit, no JVD, supple, symmetrical, trachea midline and thyroid not enlarged, symmetric, no tenderness/mass/nodules Back: symmetric, no curvature. ROM normal. No CVA tenderness. Lungs: clear to auscultation bilaterally Breasts: gyn Heart: regular rate and rhythm, S1, S2 normal, no murmur, click, rub or gallop Abdomen: soft, non-tender; bowel sounds normal; no masses,  no organomegaly Pelvic: deferred =--gyn Extremities: extremities normal, atraumatic, no cyanosis or edema Pulses: 2+ and symmetric Skin: Skin color, texture, turgor normal. No rashes or lesions Lymph nodes: Cervical, supraclavicular, and axillary nodes normal. Neurologic: Alert and oriented X 3, normal strength and tone. Normal symmetric reflexes. Normal coordination and gait    Assessment:     Healthy female exam.      Plan:    ghm utd Check labs See After Visit Summary for Counseling Recommendations    1. Hyperlipidemia LDL goal <100 Encouraged heart healthy diet, increase exercise, avoid trans fats, consider a krill oil cap daily - Lipid panel  2. Essential hypertension Well controlled, no changes to meds. Encouraged heart healthy diet such as the DASH diet and exercise as tolerated.  - CBC with Differential/Platelet  3. B12 deficiency  - Vitamin B12  4. Preventative health care See above - Lipid panel - CBC with Differential/Platelet - Vitamin B12 - TSH - Comprehensive metabolic panel - POCT Urinalysis Dipstick (Automated) - Fecal occult blood, imunochemical; Future  5. Hyperlipidemia, unspecified hyperlipidemia type Encouraged heart healthy diet, increase exercise, avoid trans fats, consider a krill oil cap daily - atorvastatin (LIPITOR) 10 MG tablet; Take 1 tablet (10 mg total) daily by mouth.  Dispense: 90 tablet; Refill: 1

## 2017-09-16 ENCOUNTER — Encounter: Payer: Self-pay | Admitting: Family Medicine

## 2017-09-19 ENCOUNTER — Other Ambulatory Visit: Payer: Self-pay | Admitting: Family Medicine

## 2017-09-21 ENCOUNTER — Other Ambulatory Visit: Payer: Self-pay | Admitting: *Deleted

## 2017-09-21 DIAGNOSIS — R739 Hyperglycemia, unspecified: Secondary | ICD-10-CM

## 2017-09-21 DIAGNOSIS — E785 Hyperlipidemia, unspecified: Secondary | ICD-10-CM

## 2017-09-21 DIAGNOSIS — I10 Essential (primary) hypertension: Secondary | ICD-10-CM

## 2017-10-31 ENCOUNTER — Other Ambulatory Visit: Payer: Self-pay | Admitting: Family Medicine

## 2017-12-01 ENCOUNTER — Other Ambulatory Visit: Payer: Self-pay | Admitting: Family Medicine

## 2017-12-01 DIAGNOSIS — J302 Other seasonal allergic rhinitis: Secondary | ICD-10-CM

## 2017-12-21 ENCOUNTER — Other Ambulatory Visit (INDEPENDENT_AMBULATORY_CARE_PROVIDER_SITE_OTHER)

## 2017-12-21 ENCOUNTER — Other Ambulatory Visit: Payer: Self-pay | Admitting: Family Medicine

## 2017-12-21 DIAGNOSIS — I10 Essential (primary) hypertension: Secondary | ICD-10-CM

## 2017-12-21 DIAGNOSIS — R739 Hyperglycemia, unspecified: Secondary | ICD-10-CM | POA: Diagnosis not present

## 2017-12-21 DIAGNOSIS — E785 Hyperlipidemia, unspecified: Secondary | ICD-10-CM

## 2017-12-21 LAB — LIPID PANEL
Cholesterol: 128 mg/dL (ref 0–200)
HDL: 37.5 mg/dL — ABNORMAL LOW (ref >39.00)
LDL Cholesterol: 74 mg/dL (ref 0–99)
NonHDL: 90.32
TRIGLYCERIDES: 80 mg/dL (ref 0.0–149.0)
Total CHOL/HDL Ratio: 3
VLDL: 16 mg/dL (ref 0.0–40.0)

## 2017-12-21 LAB — COMPREHENSIVE METABOLIC PANEL
ALT: 22 U/L (ref 0–35)
AST: 17 U/L (ref 0–37)
Albumin: 4.3 g/dL (ref 3.5–5.2)
Alkaline Phosphatase: 60 U/L (ref 39–117)
BILIRUBIN TOTAL: 0.5 mg/dL (ref 0.2–1.2)
BUN: 16 mg/dL (ref 6–23)
CALCIUM: 9.7 mg/dL (ref 8.4–10.5)
CHLORIDE: 104 meq/L (ref 96–112)
CO2: 31 mEq/L (ref 19–32)
CREATININE: 0.81 mg/dL (ref 0.40–1.20)
GFR: 92.76 mL/min (ref >60.00)
Glucose, Bld: 112 mg/dL — ABNORMAL HIGH (ref 70–99)
Potassium: 4.4 mEq/L (ref 3.5–5.1)
SODIUM: 139 meq/L (ref 135–145)
TOTAL PROTEIN: 7.8 g/dL (ref 6.0–8.3)

## 2017-12-21 LAB — HEMOGLOBIN A1C: Hgb A1c MFr Bld: 5.6 % (ref 4.6–6.5)

## 2017-12-22 ENCOUNTER — Encounter: Payer: Self-pay | Admitting: *Deleted

## 2018-01-01 ENCOUNTER — Other Ambulatory Visit: Payer: Self-pay | Admitting: Family Medicine

## 2018-01-01 DIAGNOSIS — E785 Hyperlipidemia, unspecified: Secondary | ICD-10-CM

## 2018-01-18 ENCOUNTER — Telehealth: Payer: Self-pay | Admitting: Family Medicine

## 2018-01-18 DIAGNOSIS — I10 Essential (primary) hypertension: Secondary | ICD-10-CM

## 2018-01-18 MED ORDER — AMLODIPINE BESYLATE 10 MG PO TABS: 10.0000 mg | ORAL_TABLET | Freq: Every day | ORAL | 1 refills | Status: DC

## 2018-01-18 MED ORDER — CARVEDILOL 6.25 MG PO TABS: 6.2500 mg | ORAL_TABLET | Freq: Two times a day (BID) | ORAL | 0 refills | Status: DC

## 2018-01-18 MED ORDER — HYDROCHLOROTHIAZIDE 25 MG PO TABS: 25.0000 mg | ORAL_TABLET | Freq: Every day | ORAL | 0 refills | Status: DC

## 2018-01-18 NOTE — Addendum Note (Signed)
Addended by: Tarry Kos on: 01/18/2018 10:08 AM   Modules accepted: Orders

## 2018-01-18 NOTE — Telephone Encounter (Signed)
Copied from [place] [phone]. Topic: Quick Communication - Rx Refill/Question >> Jan 18, 2018  8:09 AM Robina Ade, Helene Kelp D wrote: Medication:carvedilol (COREG) 6.25 MG tablet patient needs 30 sent to [facility] and the 60 to [facility], amLODipine (NORVASC) 10 MG tablet to [facility],  hydrochlorothiazide (HYDRODIURIL) 25 MG tablet to [facility].  Has the patient contacted their pharmacy? Yes   (Agent: If no, request that the patient contact the pharmacy for the refill.)   Preferred Pharmacy (with phone number or street name):[facility], [place]   Agent: Please be advised that RX refills may take up to 3 business days. We ask that you follow-up with your pharmacy.

## 2018-03-18 ENCOUNTER — Other Ambulatory Visit: Payer: Self-pay | Admitting: Family Medicine

## 2018-03-18 DIAGNOSIS — I10 Essential (primary) hypertension: Secondary | ICD-10-CM

## 2018-03-18 DIAGNOSIS — E785 Hyperlipidemia, unspecified: Secondary | ICD-10-CM

## 2018-03-26 ENCOUNTER — Encounter: Payer: Self-pay | Admitting: Internal Medicine

## 2018-03-30 ENCOUNTER — Other Ambulatory Visit: Payer: Self-pay | Admitting: Family Medicine

## 2018-03-30 DIAGNOSIS — I10 Essential (primary) hypertension: Secondary | ICD-10-CM

## 2018-04-16 ENCOUNTER — Encounter: Payer: Self-pay | Admitting: Internal Medicine

## 2018-04-19 ENCOUNTER — Other Ambulatory Visit: Payer: Self-pay | Admitting: Family Medicine

## 2018-04-19 DIAGNOSIS — I10 Essential (primary) hypertension: Secondary | ICD-10-CM

## 2018-05-18 ENCOUNTER — Ambulatory Visit (INDEPENDENT_AMBULATORY_CARE_PROVIDER_SITE_OTHER): Admitting: Family Medicine

## 2018-05-18 ENCOUNTER — Encounter: Payer: Self-pay | Admitting: Family Medicine

## 2018-05-18 VITALS — BP 127/57 | HR 69 | Temp 98.2°F | Resp 16 | Ht 66.0 in | Wt 208.0 lb

## 2018-05-18 DIAGNOSIS — R739 Hyperglycemia, unspecified: Secondary | ICD-10-CM | POA: Diagnosis not present

## 2018-05-18 DIAGNOSIS — I10 Essential (primary) hypertension: Secondary | ICD-10-CM | POA: Diagnosis not present

## 2018-05-18 DIAGNOSIS — E785 Hyperlipidemia, unspecified: Secondary | ICD-10-CM

## 2018-05-18 MED ORDER — CARVEDILOL 6.25 MG PO TABS: 6.2500 mg | ORAL_TABLET | Freq: Two times a day (BID) | ORAL | 1 refills | Status: DC

## 2018-05-18 MED ORDER — HYDROCHLOROTHIAZIDE 25 MG PO TABS: 25.0000 mg | ORAL_TABLET | Freq: Every day | ORAL | 1 refills | Status: DC

## 2018-05-18 MED ORDER — LOSARTAN POTASSIUM 50 MG PO TABS: 50.0000 mg | ORAL_TABLET | Freq: Every day | ORAL | 1 refills | Status: DC

## 2018-05-18 NOTE — Assessment & Plan Note (Signed)
Tolerating statin, encouraged heart healthy diet, avoid trans fats, minimize simple carbs and saturated fats. Increase exercise as tolerated 

## 2018-05-18 NOTE — Patient Instructions (Addendum)
Debrox for ears      DASH Eating Plan DASH stands for "Dietary Approaches to Stop Hypertension." The DASH eating plan is a healthy eating plan that has been shown to reduce high blood pressure (hypertension). It may also reduce your risk for type 2 diabetes, heart disease, and stroke. The DASH eating plan may also help with weight loss. What are tips for following this plan? General guidelines  Avoid eating more than 2,300 mg (milligrams) of salt (sodium) a day. If you have hypertension, you may need to reduce your sodium intake to 1,500 mg a day.  Limit alcohol intake to no more than 1 drink a day for nonpregnant women and 2 drinks a day for men. One drink equals 12 oz of beer, 5 oz of wine, or 1 oz of hard liquor.  Work with your health care provider to maintain a healthy body weight or to lose weight. Ask what an ideal weight is for you.  Get at least 30 minutes of exercise that causes your heart to beat faster (aerobic exercise) most days of the week. Activities may include walking, swimming, or biking.  Work with your health care provider or diet and nutrition specialist (dietitian) to adjust your eating plan to your individual calorie needs. Reading food labels  Check food labels for the amount of sodium per serving. Choose foods with less than 5 percent of the Daily Value of sodium. Generally, foods with less than 300 mg of sodium per serving fit into this eating plan.  To find whole grains, look for the word "whole" as the first word in the ingredient list. Shopping  Buy products labeled as "low-sodium" or "no salt added."  Buy fresh foods. Avoid canned foods and premade or frozen meals. Cooking  Avoid adding salt when cooking. Use salt-free seasonings or herbs instead of table salt or sea salt. Check with your health care provider or pharmacist before using salt substitutes.  Do not fry foods. Cook foods using healthy methods such as baking, boiling, grilling, and broiling  instead.  Cook with heart-healthy oils, such as olive, canola, soybean, or sunflower oil. Meal planning   Eat a balanced diet that includes: ? 5 or more servings of fruits and vegetables each day. At each meal, try to fill half of your plate with fruits and vegetables. ? Up to 6-8 servings of whole grains each day. ? Less than 6 oz of lean meat, poultry, or fish each day. A 3-oz serving of meat is about the same size as a deck of cards. One egg equals 1 oz. ? 2 servings of low-fat dairy each day. ? A serving of nuts, seeds, or beans 5 times each week. ? Heart-healthy fats. Healthy fats called Omega-3 fatty acids are found in foods such as flaxseeds and coldwater fish, like sardines, salmon, and mackerel.  Limit how much you eat of the following: ? Canned or prepackaged foods. ? Food that is high in trans fat, such as fried foods. ? Food that is high in saturated fat, such as fatty meat. ? Sweets, desserts, sugary drinks, and other foods with added sugar. ? Full-fat dairy products.  Do not salt foods before eating.  Try to eat at least 2 vegetarian meals each week.  Eat more home-cooked food and less restaurant, buffet, and fast food.  When eating at a restaurant, ask that your food be prepared with less salt or no salt, if possible. What foods are recommended? The items listed may not be a  complete list. Talk with your dietitian about what dietary choices are best for you. Grains Whole-grain or whole-wheat bread. Whole-grain or whole-wheat pasta. Brown rice. [place]row. Bulgur. Whole-grain and low-sodium cereals. Pita bread. Low-fat, low-sodium crackers. Whole-wheat flour tortillas. Vegetables Fresh or frozen vegetables (raw, steamed, roasted, or grilled). Low-sodium or reduced-sodium tomato and vegetable juice. Low-sodium or reduced-sodium tomato sauce and tomato paste. Low-sodium or reduced-sodium canned vegetables. Fruits All fresh, dried, or frozen fruit. Canned fruit in  natural juice (without added sugar). Meat and other protein foods Skinless chicken or [place]. Ground chicken or [place]. Pork with fat trimmed off. Fish and seafood. Egg whites. Dried beans, peas, or lentils. Unsalted nuts, nut butters, and seeds. Unsalted canned beans. Lean cuts of beef with fat trimmed off. Low-sodium, lean deli meat. Dairy Low-fat (1%) or fat-free (skim) milk. Fat-free, low-fat, or reduced-fat cheeses. Nonfat, low-sodium ricotta or cottage cheese. Low-fat or nonfat yogurt. Low-fat, low-sodium cheese. Fats and oils Soft margarine without trans fats. Vegetable oil. Low-fat, reduced-fat, or light mayonnaise and salad dressings (reduced-sodium). Canola, safflower, olive, soybean, and sunflower oils. Avocado. Seasoning and other foods Herbs. Spices. Seasoning mixes without salt. Unsalted popcorn and pretzels. Fat-free sweets. What foods are not recommended? The items listed may not be a complete list. Talk with your dietitian about what dietary choices are best for you. Grains Baked goods made with fat, such as croissants, muffins, or some breads. Dry pasta or rice meal packs. Vegetables Creamed or fried vegetables. Vegetables in a cheese sauce. Regular canned vegetables (not low-sodium or reduced-sodium). Regular canned tomato sauce and paste (not low-sodium or reduced-sodium). Regular tomato and vegetable juice (not low-sodium or reduced-sodium). Angie Fava. Olives. Fruits Canned fruit in a light or heavy syrup. Fried fruit. Fruit in cream or butter sauce. Meat and other protein foods Fatty cuts of meat. Ribs. Fried meat. Berniece Salines. Sausage. Bologna and other processed lunch meats. Salami. Fatback. Hotdogs. Bratwurst. Salted nuts and seeds. Canned beans with added salt. Canned or smoked fish. Whole eggs or egg yolks. Chicken or [place] with skin. Dairy Whole or 2% milk, cream, and half-and-half. Whole or full-fat cream cheese. Whole-fat or sweetened yogurt. Full-fat cheese. Nondairy  creamers. Whipped toppings. Processed cheese and cheese spreads. Fats and oils Butter. Stick margarine. Lard. Shortening. Ghee. Bacon fat. Tropical oils, such as coconut, palm kernel, or palm oil. Seasoning and other foods Salted popcorn and pretzels. Onion salt, garlic salt, seasoned salt, table salt, and sea salt. Worcestershire sauce. Tartar sauce. Barbecue sauce. Teriyaki sauce. Soy sauce, including reduced-sodium. Steak sauce. Canned and packaged gravies. Fish sauce. Oyster sauce. Cocktail sauce. Horseradish that you find on the shelf. Ketchup. Mustard. Meat flavorings and tenderizers. Bouillon cubes. Hot sauce and Tabasco sauce. Premade or packaged marinades. Premade or packaged taco seasonings. Relishes. Regular salad dressings. Where to find more information:  National Heart, Lung, and [place]: https://wilson-eaton.com/  American Heart Association: www.heart.org Summary  The DASH eating plan is a healthy eating plan that has been shown to reduce high blood pressure (hypertension). It may also reduce your risk for type 2 diabetes, heart disease, and stroke.  With the DASH eating plan, you should limit salt (sodium) intake to 2,300 mg a day. If you have hypertension, you may need to reduce your sodium intake to 1,500 mg a day.  When on the DASH eating plan, aim to eat more fresh fruits and vegetables, whole grains, lean proteins, low-fat dairy, and heart-healthy fats.  Work with your health care provider or diet and nutrition specialist (dietitian)  to adjust your eating plan to your individual calorie needs. This information is not intended to replace advice given to you by your health care provider. Make sure you discuss any questions you have with your health care provider. Document Released: 10/09/2011 Document Revised: 10/13/2016 Document Reviewed: 10/13/2016 Elsevier Interactive Patient Education  2018 Elsevier Inc.  

## 2018-05-18 NOTE — Progress Notes (Signed)
Patient ID: Alicia Knapp, female    DOB: 12/09/1955  Age: 62 y.o. MRN: [id number]    Subjective:  Subjective  HPI Alicia Knapp presents for f/u bp and she is struggling with her weight and would like help with this.     Review of Systems  Constitutional: Negative for appetite change, diaphoresis, fatigue and unexpected weight change.  Eyes: Negative for pain, redness and visual disturbance.  Respiratory: Negative for cough, chest tightness, shortness of breath and wheezing.   Cardiovascular: Negative for chest pain, palpitations and leg swelling.  Endocrine: Negative for cold intolerance, heat intolerance, polydipsia, polyphagia and polyuria.  Genitourinary: Negative for difficulty urinating, dysuria and frequency.  Neurological: Negative for dizziness, light-headedness, numbness and headaches.    History Past Medical History:  Diagnosis Date  . Anxiety   . Hypertension   . Postoperative nausea     She has a past surgical history that includes Abdominal hysterectomy and Appendectomy.   Her family history includes Arthritis in her maternal grandmother; Cancer in her father; Diabetes in her mother; Hypertension in her brother and sister; Stroke in her brother; Thyroid disease in her mother.She reports that she quit smoking about 3 years ago. Her smoking use included cigarettes. She has never used smokeless tobacco. She reports that she does not drink alcohol or use drugs.  Current Outpatient Medications on File Prior to Visit  Medication Sig Dispense Refill  . amLODipine (NORVASC) 10 MG tablet Take 1 tablet (10 mg total) by mouth daily. 90 tablet 1  . ARIPiprazole (ABILIFY) 2 MG tablet Take 1 tablet by mouth daily.    . ARIPiprazole (ABILIFY) 5 MG tablet Take 1 tablet by mouth daily.  0  . aspirin 81 MG tablet Take 81 mg by mouth daily.      [name] atorvastatin (LIPITOR) 10 MG tablet Take 1 tablet (10 mg total) daily by mouth. 90 tablet 1  . cholecalciferol (VITAMIN D) 1000 UNITS tablet  Take 1,000 Units by mouth daily.    . Cyanocobalamin (VITAMIN B 12) 100 MCG LOZG Take by mouth.    . fluticasone (FLONASE) 50 MCG/ACT nasal spray USE 2 SPRAYS IN EACH NOSTRIL DAILY 48 g 2  . levocetirizine (XYZAL) 5 MG tablet TAKE 1 TABLET EVERY EVENING 90 tablet 0  . LORazepam (ATIVAN) 1 MG tablet Take 1 mg by mouth every 8 (eight) hours.      . Multiple Vitamin (MULTIVITAMIN) tablet Take 1 tablet by mouth daily.     No current facility-administered medications on file prior to visit.      Objective:  Objective  Physical Exam  Constitutional: She is oriented to person, place, and time. She appears well-developed and well-nourished. No distress.  HENT:  Head: Normocephalic and atraumatic.  Right Ear: External ear normal.  Left Ear: External ear normal.  Nose: Nose normal.  Mouth/Throat: Oropharynx is clear and moist.  Eyes: Pupils are equal, round, and reactive to light. Conjunctivae and EOM are normal.  Neck: Normal range of motion. Neck supple. No JVD present. Carotid bruit is not present. No thyromegaly present.  Cardiovascular: Normal rate, regular rhythm and normal heart sounds.  No murmur heard. Pulmonary/Chest: Effort normal and breath sounds normal. No respiratory distress. She has no wheezes. She has no rales. She exhibits no tenderness.  Musculoskeletal: She exhibits no edema.  Neurological: She is alert and oriented to person, place, and time.  Psychiatric: She has a normal mood and affect. Her behavior is normal. Judgment and thought content normal.  Nursing  note and vitals reviewed.  BP (!) 127/57 (BP Location: Right Arm, Cuff Size: Large)   Pulse 69   Temp 98.2 F (36.8 C) (Oral)   Resp 16   Ht 5\' 6"  (1.676 m)   Wt 208 lb (94.3 kg)   SpO2 100%   BMI 33.57 kg/m  Wt Readings from Last 3 Encounters:  05/18/18 208 lb (94.3 kg)  09/15/17 211 lb 3.2 oz (95.8 kg)  12/15/16 204 lb 3.2 oz (92.6 kg)     Lab Results  Component Value Date   WBC 3.6 (L) 09/15/2017    HGB 12.7 09/15/2017   HCT 39.3 09/15/2017   PLT 349.0 09/15/2017   GLUCOSE 112 (H) 12/21/2017   CHOL 128 12/21/2017   TRIG 80.0 12/21/2017   HDL 37.50 (L) 12/21/2017   LDLDIRECT 140.5 08/12/2013   LDLCALC 74 12/21/2017   ALT 22 12/21/2017   AST 17 12/21/2017   NA 139 12/21/2017   K 4.4 12/21/2017   CL 104 12/21/2017   CREATININE 0.81 12/21/2017   BUN 16 12/21/2017   CO2 31 12/21/2017   TSH 1.81 09/15/2017   HGBA1C 5.6 12/21/2017   MICROALBUR <0.7 02/22/2015    Mm Digital Screening Bilateral  Result Date: 08/17/2017 CLINICAL DATA:  Screening. EXAM: DIGITAL SCREENING BILATERAL MAMMOGRAM WITH CAD COMPARISON:  Previous exam(s). ACR Breast Density Category c: The breast tissue is heterogeneously dense, which may obscure small masses. FINDINGS: There are no findings suspicious for malignancy. Images were processed with CAD. IMPRESSION: No mammographic evidence of malignancy. A result letter of this screening mammogram will be mailed directly to the patient. RECOMMENDATION: Screening mammogram in one year. (Code:SM-B-01Y) BI-RADS CATEGORY  1: Negative. Electronically Signed   By: Franki Cabot M.D.   On: 08/17/2017 11:32     Assessment & Plan:  Plan  I have changed Alicia Knapp's losartan and hydrochlorothiazide. I am also having her maintain her aspirin, LORazepam, cholecalciferol, multivitamin, Vitamin B 12, atorvastatin, fluticasone, amLODipine, levocetirizine, ARIPiprazole, ARIPiprazole, and carvedilol.  Meds ordered this encounter  Medications  . carvedilol (COREG) 6.25 MG tablet    Sig: Take 1 tablet (6.25 mg total) by mouth 2 (two) times daily with a meal.    Dispense:  180 tablet    Refill:  1  . losartan (COZAAR) 50 MG tablet    Sig: Take 1 tablet (50 mg total) by mouth daily.    Dispense:  90 tablet    Refill:  1  . hydrochlorothiazide (HYDRODIURIL) 25 MG tablet    Sig: Take 1 tablet (25 mg total) by mouth daily.    Dispense:  90 tablet    Refill:  1    Problem List  Items Addressed This Visit      Unprioritized   Essential hypertension - Primary    Well controlled, no changes to meds. Encouraged heart healthy diet such as the DASH diet and exercise as tolerated.       Relevant Medications   carvedilol (COREG) 6.25 MG tablet   losartan (COZAAR) 50 MG tablet   hydrochlorothiazide (HYDRODIURIL) 25 MG tablet   Other Relevant Orders   Lipid panel   Hemoglobin A1c   Comprehensive metabolic panel   Hyperlipidemia LDL goal <100    Tolerating statin, encouraged heart healthy diet, avoid trans fats, minimize simple carbs and saturated fats. Increase exercise as tolerated      Relevant Medications   carvedilol (COREG) 6.25 MG tablet   losartan (COZAAR) 50 MG tablet   hydrochlorothiazide (HYDRODIURIL) 25  MG tablet    Other Visit Diagnoses    Hyperlipidemia, unspecified hyperlipidemia type       Relevant Medications   carvedilol (COREG) 6.25 MG tablet   losartan (COZAAR) 50 MG tablet   hydrochlorothiazide (HYDRODIURIL) 25 MG tablet   Other Relevant Orders   Lipid panel   Hemoglobin A1c   Comprehensive metabolic panel   Hyperglycemia       Relevant Orders   Lipid panel   Hemoglobin A1c   Comprehensive metabolic panel   Morbid obesity (HCC)       Relevant Orders   Amb Ref to Medical Weight Management      Follow-up: Return in about 6 months (around 11/18/2018), or if symptoms worsen or fail to improve, for hypertension, hyperlipidemia, annual exam, fasting.  Ann Held, DO

## 2018-05-18 NOTE — Assessment & Plan Note (Signed)
Well controlled, no changes to meds. Encouraged heart healthy diet such as the DASH diet and exercise as tolerated.  °

## 2018-05-19 LAB — COMPREHENSIVE METABOLIC PANEL
ALK PHOS: 64 U/L (ref 39–117)
ALT: 16 U/L (ref 0–35)
AST: 16 U/L (ref 0–37)
Albumin: 4.4 g/dL (ref 3.5–5.2)
BILIRUBIN TOTAL: 0.4 mg/dL (ref 0.2–1.2)
BUN: 11 mg/dL (ref 6–23)
CO2: 31 mEq/L (ref 19–32)
Calcium: 10 mg/dL (ref 8.4–10.5)
Chloride: 102 mEq/L (ref 96–112)
Creatinine, Ser: 0.92 mg/dL (ref 0.40–1.20)
GFR: 79.97 mL/min (ref >60.00)
GLUCOSE: 98 mg/dL (ref 70–99)
Potassium: 4.3 mEq/L (ref 3.5–5.1)
Sodium: 140 mEq/L (ref 135–145)
TOTAL PROTEIN: 7.6 g/dL (ref 6.0–8.3)

## 2018-05-19 LAB — LIPID PANEL
CHOLESTEROL: 142 mg/dL (ref 0–200)
HDL: 45.5 mg/dL (ref >39.00)
LDL Cholesterol: 75 mg/dL (ref 0–99)
NonHDL: 96.17
TRIGLYCERIDES: 106 mg/dL (ref 0.0–149.0)
Total CHOL/HDL Ratio: 3
VLDL: 21.2 mg/dL (ref 0.0–40.0)

## 2018-05-19 LAB — HEMOGLOBIN A1C: HEMOGLOBIN A1C: 5.7 % (ref 4.6–6.5)

## 2018-06-14 ENCOUNTER — Encounter

## 2018-06-18 ENCOUNTER — Encounter: Payer: Self-pay | Admitting: Internal Medicine

## 2018-06-18 ENCOUNTER — Ambulatory Visit (AMBULATORY_SURGERY_CENTER): Payer: Self-pay | Admitting: *Deleted

## 2018-06-18 VITALS — Ht 66.0 in | Wt 212.0 lb

## 2018-06-18 DIAGNOSIS — Z86010 Personal history of colonic polyps: Secondary | ICD-10-CM

## 2018-06-18 DIAGNOSIS — Z800 Family history of malignant neoplasm of digestive organs: Secondary | ICD-10-CM

## 2018-06-18 MED ORDER — NA SULFATE-K SULFATE-MG SULF 17.5-3.13-1.6 GM/177ML PO SOLN: 1.0000 | Freq: Once | ORAL | 0 refills | Status: AC

## 2018-06-18 NOTE — Progress Notes (Signed)
Patient denies any allergies to eggs or soy. Patient denies any problems with anesthesia/sedation. Patient denies any oxygen use at home. Patient denies taking any diet/weight loss medications or blood thinners. EMMI education offered, pt declined. 2 day prep given to pt.

## 2018-06-28 ENCOUNTER — Encounter: Payer: Self-pay | Admitting: Internal Medicine

## 2018-06-28 ENCOUNTER — Ambulatory Visit (AMBULATORY_SURGERY_CENTER): Admitting: Internal Medicine

## 2018-06-28 VITALS — BP 115/61 | HR 50 | Temp 98.7°F | Resp 14 | Ht 66.0 in | Wt 208.0 lb

## 2018-06-28 DIAGNOSIS — D125 Benign neoplasm of sigmoid colon: Secondary | ICD-10-CM

## 2018-06-28 DIAGNOSIS — D123 Benign neoplasm of transverse colon: Secondary | ICD-10-CM

## 2018-06-28 DIAGNOSIS — Z86010 Personal history of colonic polyps: Secondary | ICD-10-CM | POA: Diagnosis present

## 2018-06-28 DIAGNOSIS — K635 Polyp of colon: Secondary | ICD-10-CM

## 2018-06-28 DIAGNOSIS — D122 Benign neoplasm of ascending colon: Secondary | ICD-10-CM | POA: Diagnosis not present

## 2018-06-28 DIAGNOSIS — Z800 Family history of malignant neoplasm of digestive organs: Secondary | ICD-10-CM | POA: Diagnosis not present

## 2018-06-28 MED ORDER — SODIUM CHLORIDE 0.9 % IV SOLN
500.0000 mL | Freq: Once | INTRAVENOUS | Status: DC
Start: 2018-06-28 — End: 2018-06-28

## 2018-06-28 NOTE — Op Note (Signed)
[facility] Patient Name: Alicia Knapp Procedure Date: 06/28/2018 2:12 PM MRN: [id number] Endoscopist: Jerene Bears , MD Age: 62 Referring MD:  Date of Birth: 02/18/1956 Gender: Female Account #: [id number] Procedure:                Colonoscopy Indications:              High risk colon cancer surveillance: Personal                            history of colonic polyps (adenoma and SSP), Family                            history of colon cancer in a first-degree relative                            (brother), Last colonoscopy 3 years ago Medicines:                Monitored Anesthesia Care Procedure:                Pre-Anesthesia Assessment:                           - Prior to the procedure, a History and Physical                            was performed, and patient medications and                            allergies were reviewed. The patient's tolerance of                            previous anesthesia was also reviewed. The risks                            and benefits of the procedure and the sedation                            options and risks were discussed with the patient.                            All questions were answered, and informed consent                            was obtained. Prior Anticoagulants: The patient has                            taken no previous anticoagulant or antiplatelet                            agents. ASA Grade Assessment: II - A patient with                            mild systemic disease. After reviewing the risks  and benefits, the patient was deemed in                            satisfactory condition to undergo the procedure.                           After obtaining informed consent, the colonoscope                            was passed under direct vision. Throughout the                            procedure, the patient's blood pressure, pulse, and                            oxygen saturations were  monitored continuously. The                            Model PCF-H190DL [id number]) scope was introduced                            through the anus and advanced to the cecum,                            identified by appendiceal orifice and ileocecal                            valve. The colonoscopy was performed without                            difficulty. The patient tolerated the procedure                            well. The quality of the bowel preparation was good                            (2 day MiraLax + Suprep). The ileocecal valve,                            appendiceal orifice, and rectum were photographed. Scope In: 2:16:54 PM Scope Out: 2:44:03 PM Scope Withdrawal Time: 0 hours 19 minutes 42 seconds  Total Procedure Duration: 0 hours 27 minutes 9 seconds  Findings:                 The digital rectal exam was normal.                           Two sessile polyps were found in the ascending                            colon. The polyps were 5 to 8 mm in size. These                            polyps were removed with a  cold snare. Resection                            and retrieval were complete.                           Two sessile polyps were found in the transverse                            colon. The polyps were 4 to 5 mm in size. These                            polyps were removed with a cold snare. Resection                            and retrieval were complete.                           Four sessile polyps were found in the sigmoid                            colon. The polyps were 4 to 6 mm in size. These                            polyps were removed with a cold snare. Resection                            and retrieval were complete.                           Multiple small and large-mouthed diverticula were                            found from ascending colon to sigmoid colon.                           Internal hemorrhoids were found during                             retroflexion. The hemorrhoids were small. Complications:            No immediate complications. Estimated Blood Loss:     Estimated blood loss was minimal. Impression:               - Two 5 to 8 mm polyps in the ascending colon,                            removed with a cold snare. Resected and retrieved.                           - Two 4 to 5 mm polyps in the transverse colon,                            removed with a cold snare. Resected and retrieved.                           -  Four 4 to 6 mm polyps in the sigmoid colon,                            removed with a cold snare. Resected and retrieved.                           - Moderate diverticulosis from ascending colon to                            sigmoid colon.                           - Internal hemorrhoids. Recommendation:           - Patient has a contact number available for                            emergencies. The signs and symptoms of potential                            delayed complications were discussed with the                            patient. Return to normal activities tomorrow.                            Written discharge instructions were provided to the                            patient.                           - Resume previous diet.                           - Continue present medications.                           - Await pathology results.                           - Repeat colonoscopy is recommended for                            surveillance (likely 3 years). The colonoscopy date                            will be determined after pathology results from                            today's exam become available for review. Jerene Bears, MD 06/28/2018 2:50:23 PM This report has been signed electronically.

## 2018-06-28 NOTE — Progress Notes (Signed)
Called to room to assist during endoscopic procedure.  Patient ID and intended procedure confirmed with present staff. Received instructions for my participation in the procedure from the performing physician.  

## 2018-06-28 NOTE — Progress Notes (Signed)
Report given to PACU, vss 

## 2018-06-28 NOTE — Patient Instructions (Signed)
Please read handouts on Polyps, Diverticulosis, and Hemorrhoids. Continue present medications.    YOU HAD AN ENDOSCOPIC PROCEDURE TODAY AT [facility]:   Refer to the procedure report that was given to you for any specific questions about what was found during the examination.  If the procedure report does not answer your questions, please call your gastroenterologist to clarify.  If you requested that your care partner not be given the details of your procedure findings, then the procedure report has been included in a sealed envelope for you to review at your convenience later.  YOU SHOULD EXPECT: Some feelings of bloating in the abdomen. Passage of more gas than usual.  Walking can help get rid of the air that was put into your GI tract during the procedure and reduce the bloating. If you had a lower endoscopy (such as a colonoscopy or flexible sigmoidoscopy) you may notice spotting of blood in your stool or on the toilet paper. If you underwent a bowel prep for your procedure, you may not have a normal bowel movement for a few days.  Please Note:  You might notice some irritation and congestion in your nose or some drainage.  This is from the oxygen used during your procedure.  There is no need for concern and it should clear up in a day or so.  SYMPTOMS TO REPORT IMMEDIATELY:   Following lower endoscopy (colonoscopy or flexible sigmoidoscopy):  Excessive amounts of blood in the stool  Significant tenderness or worsening of abdominal pains  Swelling of the abdomen that is new, acute  Fever of 100F or higher   For urgent or emergent issues, a gastroenterologist can be reached at any hour by calling [phone].   DIET:  We do recommend a small meal at first, but then you may proceed to your regular diet.  Drink plenty of fluids but you should avoid alcoholic beverages for 24 hours.  ACTIVITY:  You should plan to take it easy for the rest of today and you should NOT  DRIVE or use heavy machinery until tomorrow (because of the sedation medicines used during the test).    FOLLOW UP: Our staff will call the number listed on your records the next business day following your procedure to check on you and address any questions or concerns that you may have regarding the information given to you following your procedure. If we do not reach you, we will leave a message.  However, if you are feeling well and you are not experiencing any problems, there is no need to return our call.  We will assume that you have returned to your regular daily activities without incident.  If any biopsies were taken you will be contacted by phone or by letter within the next 1-3 weeks.  Please call us at [phone] if you have not heard about the biopsies in 3 weeks.    SIGNATURES/CONFIDENTIALITY: You and/or your care partner have signed paperwork which will be entered into your electronic medical record.  These signatures attest to the fact that that the information above on your After Visit Summary has been reviewed and is understood.  Full responsibility of the confidentiality of this discharge information lies with you and/or your care-partner.

## 2018-06-28 NOTE — Progress Notes (Signed)
Pt's states no medical or surgical changes since previsit or office visit. 

## 2018-06-29 ENCOUNTER — Telehealth: Payer: Self-pay

## 2018-06-29 NOTE — Telephone Encounter (Signed)
  Follow up Call-  Call back number 06/28/2018  Post procedure Call Back phone  # [phone] cell  Permission to leave phone message Yes  Some recent data might be hidden     Patient questions:  Do you have a fever, pain , or abdominal swelling? No. Pain Score  0 *  Have you tolerated food without any problems? Yes.    Have you been able to return to your normal activities? Yes.    Do you have any questions about your discharge instructions: Diet   No. Medications  No. Follow up visit  No.  Do you have questions or concerns about your Care? No.  Actions: * If pain score is 4 or above: No action needed, pain <4.

## 2018-07-06 ENCOUNTER — Encounter: Payer: Self-pay | Admitting: Internal Medicine

## 2018-07-11 ENCOUNTER — Other Ambulatory Visit: Payer: Self-pay | Admitting: Family Medicine

## 2018-07-11 DIAGNOSIS — E785 Hyperlipidemia, unspecified: Secondary | ICD-10-CM

## 2018-07-22 ENCOUNTER — Encounter (INDEPENDENT_AMBULATORY_CARE_PROVIDER_SITE_OTHER): Payer: Self-pay

## 2018-08-03 ENCOUNTER — Ambulatory Visit (INDEPENDENT_AMBULATORY_CARE_PROVIDER_SITE_OTHER): Admitting: Family Medicine

## 2018-08-03 ENCOUNTER — Encounter (INDEPENDENT_AMBULATORY_CARE_PROVIDER_SITE_OTHER): Payer: Self-pay | Admitting: Family Medicine

## 2018-08-03 VITALS — BP 145/65 | HR 61 | Temp 97.8°F | Ht 66.0 in | Wt 212.0 lb

## 2018-08-03 DIAGNOSIS — Z6834 Body mass index (BMI) 34.0-34.9, adult: Secondary | ICD-10-CM

## 2018-08-03 DIAGNOSIS — R0602 Shortness of breath: Secondary | ICD-10-CM

## 2018-08-03 DIAGNOSIS — Z9189 Other specified personal risk factors, not elsewhere classified: Secondary | ICD-10-CM | POA: Diagnosis not present

## 2018-08-03 DIAGNOSIS — R5383 Other fatigue: Secondary | ICD-10-CM | POA: Diagnosis not present

## 2018-08-03 DIAGNOSIS — Z1331 Encounter for screening for depression: Secondary | ICD-10-CM

## 2018-08-03 DIAGNOSIS — R7303 Prediabetes: Secondary | ICD-10-CM

## 2018-08-03 DIAGNOSIS — Z0289 Encounter for other administrative examinations: Secondary | ICD-10-CM

## 2018-08-03 DIAGNOSIS — E669 Obesity, unspecified: Secondary | ICD-10-CM

## 2018-08-03 NOTE — Progress Notes (Signed)
Office: [phone]  /  Fax: [phone]   Dear Dr. Carollee Herter,   Thank you for referring Alicia Knapp to our clinic. The following note includes my evaluation and treatment recommendations.  HPI:   Chief Complaint: OBESITY    [name] has been referred by Ann Held, DO for consultation regarding her obesity and obesity related comorbidities.    Alicia Knapp (MR# [id number]) is a 62 y.o. female who presents on 08/03/2018 for obesity evaluation and treatment. Current BMI is Body mass index is 34.22 kg/m.Knapp Kitchen Alicia has been struggling with her weight for many years and has been unsuccessful in either losing weight, maintaining weight loss, or reaching her healthy weight goal.     Kriste attended our information session and states she is currently in the action stage of change and ready to dedicate time achieving and maintaining a healthier weight. [name] is interested in becoming our patient and working on intensive lifestyle modifications including (but not limited to) diet, exercise and weight loss.    Alicia Knapp states her family eats meals together she thinks her family will eat healthier with  her her desired weight loss is 55 lbs she started gaining weight at age 4 her heaviest weight ever was 210 lbs. she has significant food cravings issues  she snacks frequently in the evenings she is frequently drinking liquids with calories she frequently makes poor food choices she frequently eats larger portions than normal  she struggles with emotional eating    Fatigue [name] feels her energy is lower than it should be. This has worsened with weight gain and has not worsened recently. Carrera admits to daytime somnolence and denies waking up still tired. Patient is at risk for obstructive sleep apnea. Patent has a history of symptoms of daytime fatigue and morning fatigue. Patient generally gets 8 hours of sleep per night, and states they generally have restful sleep. Snoring is present.  Apneic episodes are present. Epworth Sleepiness Score is 3  EKG was ordered today which shows sinus bradycardia.  Dyspnea on exertion Alicia Knapp notes increasing shortness of breath with exercising and seems to be worsening over time with weight gain. She notes getting out of breath sooner with activity than she used to. This has not gotten worse recently. EKG was ordered today which shows sinus bradycardia. Alicia Knapp denies orthopnea.  Pre-Diabetes Alicia Knapp has a diagnosis of prediabetes and she has had this diagnosis for just a few months. She was informed this puts her at greater risk of developing diabetes. Her last Hgb A1c was at 5.7. She is not on medications currently. Alicia Knapp is attempting to work on diet and exercise to decrease risk of diabetes. She denies nausea or hypoglycemia.  At risk for diabetes Alicia Knapp is at higher than average risk for developing diabetes due to her obesity and prediabetes. She currently denies polyuria or polydipsia.  Depression Screen [name]'s Food and Mood (modified PHQ-9) score was  Depression screen PHQ 2/9 08/03/2018  Decreased Interest 1  Down, Depressed, Hopeless 1  PHQ - 2 Score 2  Altered sleeping 0  Tired, decreased energy 0  Change in appetite 1  Feeling bad or failure about yourself  0  Trouble concentrating 0  Moving slowly or fidgety/restless 0  Suicidal thoughts 0  PHQ-9 Score 3  Difficult doing work/chores Not difficult at all    ALLERGIES: Allergies  Allergen Reactions  . Erythromycin     Causes stomach cramps  . Penicillins     rash    MEDICATIONS: Current  Outpatient Medications on File Prior to Visit  Medication Sig Dispense Refill  . amLODipine (NORVASC) 10 MG tablet Take 1 tablet (10 mg total) by mouth daily. 90 tablet 1  . ARIPiprazole (ABILIFY) 2 MG tablet Take 2 mg by mouth daily.     . ARIPiprazole (ABILIFY) 5 MG tablet Take 1 tablet by mouth daily.  0  . atorvastatin (LIPITOR) 10 MG tablet TAKE 1 TABLET DAILY 90 tablet 3  . carvedilol  (COREG) 6.25 MG tablet Take 1 tablet (6.25 mg total) by mouth 2 (two) times daily with a meal. 180 tablet 1  . cholecalciferol (VITAMIN D) 1000 UNITS tablet Take 1,000 Units by mouth daily.    . Cyanocobalamin (VITAMIN B 12) 100 MCG LOZG Take by mouth.    . fluticasone (FLONASE) 50 MCG/ACT nasal spray USE 2 SPRAYS IN EACH NOSTRIL DAILY 48 g 2  . hydrochlorothiazide (HYDRODIURIL) 25 MG tablet Take 1 tablet (25 mg total) by mouth daily. 90 tablet 1  . levocetirizine (XYZAL) 5 MG tablet TAKE 1 TABLET EVERY EVENING 90 tablet 0  . LORazepam (ATIVAN) 1 MG tablet Take 0.5 mg by mouth every 8 (eight) hours.     Knapp Kitchen losartan (COZAAR) 50 MG tablet Take 1 tablet (50 mg total) by mouth daily. 90 tablet 1   No current facility-administered medications on file prior to visit.     PAST MEDICAL HISTORY: Past Medical History:  Diagnosis Date  . Anxiety   . B12 deficiency   . Constipation   . Heart murmur   . Hyperlipidemia   . Hypertension   . Joint pain   . Postoperative nausea   . Prediabetes   . Swelling    lower ext  . Vitamin D deficiency     PAST SURGICAL HISTORY: Past Surgical History:  Procedure Laterality Date  . ABDOMINAL HYSTERECTOMY    . APPENDECTOMY    . COLONOSCOPY  05/22/2015   Dr.Pyrtle  . POLYPECTOMY      SOCIAL HISTORY: Social History   Tobacco Use  . Smoking status: Former Smoker    Types: Cigarettes    Last attempt to quit: 09/17/2014    Years since quitting: 3.8  . Smokeless tobacco: Never Used  Substance Use Topics  . Alcohol use: No    Alcohol/week: 0.0 standard drinks  . Drug use: No    FAMILY HISTORY: Family History  Problem Relation Age of Onset  . Cancer Father        prostate  . Prostate cancer Father   . Diabetes Mother   . Thyroid disease Mother   . Hypertension Mother   . Hyperlipidemia Mother   . Kidney disease Mother   . Cancer Mother   . Sleep apnea Mother   . Obesity Mother   . Hypertension Sister   . Hypertension Brother   .  Stroke Brother   . Colon cancer Brother 11       died 29 - 4-5 months after dx  . Arthritis Maternal Grandmother   . Esophageal cancer Neg Hx   . Rectal cancer Neg Hx   . Stomach cancer Neg Hx     ROS: Review of Systems  Constitutional: Positive for malaise/fatigue.  Respiratory: Positive for shortness of breath (on exertion).   Cardiovascular: Negative for orthopnea.  Gastrointestinal: Negative for nausea.  Genitourinary: Negative for frequency.  Endo/Heme/Allergies: Negative for polydipsia.       Negative for hypoglycemia    PHYSICAL EXAM: Blood pressure (!) 145/65, pulse 61, temperature  97.8 F (36.6 C), temperature source Oral, height 5\' 6"  (1.676 m), weight 212 lb (96.2 kg), SpO2 100 %. Body mass index is 34.22 kg/m. Physical Exam  Constitutional: She is oriented to person, place, and time. She appears well-developed and well-nourished.  HENT:  Head: Normocephalic and atraumatic.  Nose: Nose normal.  Eyes: EOM are normal. No scleral icterus.  Neck: Normal range of motion. Neck supple.  Cardiovascular: Normal rate and regular rhythm.  Pulmonary/Chest: Effort normal. No respiratory distress.  Abdominal: Soft. There is no tenderness.  + Obesity  Musculoskeletal: Normal range of motion.  Range of Motion normal in all 4 extremities  Neurological: She is alert and oriented to person, place, and time. Coordination normal.  Skin: Skin is warm and dry.  Psychiatric: She has a normal mood and affect. Her behavior is normal.  Vitals reviewed.   RECENT LABS AND TESTS: BMET    Component Value Date/Time   NA 140 05/18/2018 1801   K 4.3 05/18/2018 1801   CL 102 05/18/2018 1801   CO2 31 05/18/2018 1801   GLUCOSE 98 05/18/2018 1801   BUN 11 05/18/2018 1801   CREATININE 0.92 05/18/2018 1801   CALCIUM 10.0 05/18/2018 1801   GFRNONAA 134.84 04/16/2009 1252   GFRAA 85 02/21/2008 1415   Lab Results  Component Value Date   HGBA1C 5.7 05/18/2018   No results found for:  INSULIN CBC    Component Value Date/Time   WBC 3.6 (L) 09/15/2017 1153   RBC 4.43 09/15/2017 1153   HGB 12.7 09/15/2017 1153   HCT 39.3 09/15/2017 1153   PLT 349.0 09/15/2017 1153   MCV 88.7 09/15/2017 1153   MCHC 32.3 09/15/2017 1153   RDW 14.3 09/15/2017 1153   LYMPHSABS 1.6 09/15/2017 1153   MONOABS 0.3 09/15/2017 1153   EOSABS 0.1 09/15/2017 1153   BASOSABS 0.0 09/15/2017 1153   Iron/TIBC/Ferritin/ %Sat No results found for: IRON, TIBC, FERRITIN, IRONPCTSAT Lipid Panel     Component Value Date/Time   CHOL 142 05/18/2018 1801   TRIG 106.0 05/18/2018 1801   HDL 45.50 05/18/2018 1801   CHOLHDL 3 05/18/2018 1801   VLDL 21.2 05/18/2018 1801   LDLCALC 75 05/18/2018 1801   LDLDIRECT 140.5 08/12/2013 0913   Hepatic Function Panel     Component Value Date/Time   PROT 7.6 05/18/2018 1801   ALBUMIN 4.4 05/18/2018 1801   AST 16 05/18/2018 1801   ALT 16 05/18/2018 1801   ALKPHOS 64 05/18/2018 1801   BILITOT 0.4 05/18/2018 1801   BILIDIR 0.0 02/22/2015 1206      Component Value Date/Time   TSH 1.81 09/15/2017 1153   TSH 1.16 12/15/2016 0904   TSH 1.42 04/18/2016 1157    ECG  shows NSR with a rate of 58 BPM INDIRECT CALORIMETER done today shows a VO2 of 154 and a REE of 1072.  Her calculated basal metabolic rate is 2878 thus her basal metabolic rate is worse than expected.    ASSESSMENT AND PLAN: Other fatigue - Plan: EKG 12-Lead, VITAMIN D 25 Hydroxy (Vit-D Deficiency, Fractures), Vitamin B12, Folate, T3, T4, free, TSH  Shortness of breath on exertion  Prediabetes - Plan: Insulin, random  Depression screening  At risk for diabetes mellitus  Class 1 obesity with serious comorbidity and body mass index (BMI) of 34.0 to 34.9 in adult, unspecified obesity type  PLAN: Fatigue Berkeley was informed that her fatigue may be related to obesity, depression or many other causes. Labs will be ordered, and in  the meanwhile Camay has agreed to work on diet, exercise and weight  loss to help with fatigue. Proper sleep hygiene was discussed including the need for 7-8 hours of quality sleep each night. A sleep study was not ordered based on symptoms and Epworth score. We will order indirect calorimetry and EKG today.  Dyspnea on exertion November's shortness of breath appears to be obesity related and exercise induced. She has agreed to work on weight loss and gradually increase exercise to treat her exercise induced shortness of breath. If Briannon follows our instructions and loses weight without improvement of her shortness of breath, we will plan to refer to pulmonology. We will order indirect calorimetry, EKG and labs today. We will monitor this condition regularly. Kaydence agrees to this plan.  Pre-Diabetes Barbi will continue to work on weight loss, exercise, and decreasing simple carbohydrates in her diet to help decrease the risk of diabetes. She was informed that eating too many simple carbohydrates or too many calories at one sitting increases the likelihood of GI side effects. We will check insulin level today and Shaylan agreed to follow up with [name] as directed to monitor her progress.  Diabetes risk counseling Shemeika was given extended (15 minutes) diabetes prevention counseling today. She is 62 y.o. female and has risk factors for diabetes including obesity and prediabetes. We discussed intensive lifestyle modifications today with an emphasis on weight loss as well as increasing exercise and decreasing simple carbohydrates in her diet.  Depression Screen Devina had a negative depression screening. Depression is commonly associated with obesity and often results in emotional eating behaviors. We will monitor this closely and work on CBT to help improve the non-hunger eating patterns.   Obesity Illana is currently in the action stage of change and her goal is to continue with weight loss efforts. I recommend Shylo begin the structured treatment plan as follows:  She has agreed to  follow the Category 2 plan Everlyn has been instructed to eventually work up to a goal of 150 minutes of combined cardio and strengthening exercise per week for weight loss and overall health benefits. We discussed the following Behavioral Modification Strategies today: planning for success, increasing lean protein intake, increasing vegetables and work on meal planning and easy cooking plans   She was informed of the importance of frequent follow up visits to maximize her success with intensive lifestyle modifications for her multiple health conditions. She was informed we would discuss her lab results at her next visit unless there is a critical issue that needs to be addressed sooner. Rayleigh agreed to keep her next visit at the agreed upon time to discuss these results.    OBESITY BEHAVIORAL INTERVENTION VISIT  Today's visit was # 1   Starting weight: 212 lbs Starting date: 08/03/18 Today's weight : 212 lbs  Today's date: 08/03/2018 Total lbs lost to date: 0   ASK: We discussed the diagnosis of obesity with Marton Redwood today and Chia agreed to give [name] permission to discuss obesity behavioral modification therapy today.  ASSESS: Tyniah has the diagnosis of obesity and her BMI today is 34.23 Maurissa is in the action stage of change   ADVISE: Aaliyah was educated on the multiple health risks of obesity as well as the benefit of weight loss to improve her health. She was advised of the need for long term treatment and the importance of lifestyle modifications to improve her current health and to decrease her risk of future health problems.  AGREE: Multiple  dietary modification options and treatment options were discussed and  Cela agreed to follow the recommendations documented in the above note.  ARRANGE: Beulah was educated on the importance of frequent visits to treat obesity as outlined per CMS and USPSTF guidelines and agreed to schedule her next follow up appointment today.  I, Doreene Nest,  am acting as transcriptionist for Eber Jones, MD  I have reviewed the above documentation for accuracy and completeness, and I agree with the above. - Ilene Qua, MD

## 2018-08-04 LAB — T4, FREE: FREE T4: 1.26 ng/dL (ref 0.82–1.77)

## 2018-08-04 LAB — VITAMIN D 25 HYDROXY (VIT D DEFICIENCY, FRACTURES): Vit D, 25-Hydroxy: 33.3 ng/mL (ref 30.0–100.0)

## 2018-08-04 LAB — VITAMIN B12: Vitamin B-12: 1283 pg/mL — ABNORMAL HIGH (ref 232–1245)

## 2018-08-04 LAB — T3: T3 TOTAL: 107 ng/dL (ref 71–180)

## 2018-08-04 LAB — TSH: TSH: 1.76 u[IU]/mL (ref 0.450–4.500)

## 2018-08-04 LAB — FOLATE: Folate: 16.3 ng/mL (ref >3.0)

## 2018-08-04 LAB — INSULIN, RANDOM: INSULIN: 15.4 u[IU]/mL (ref 2.6–24.9)

## 2018-08-17 ENCOUNTER — Ambulatory Visit (INDEPENDENT_AMBULATORY_CARE_PROVIDER_SITE_OTHER): Admitting: Family Medicine

## 2018-08-17 VITALS — BP 141/77 | HR 62 | Temp 98.1°F | Ht 66.0 in | Wt 204.0 lb

## 2018-08-17 DIAGNOSIS — Z6833 Body mass index (BMI) 33.0-33.9, adult: Secondary | ICD-10-CM

## 2018-08-17 DIAGNOSIS — E669 Obesity, unspecified: Secondary | ICD-10-CM

## 2018-08-17 DIAGNOSIS — Z9189 Other specified personal risk factors, not elsewhere classified: Secondary | ICD-10-CM | POA: Diagnosis not present

## 2018-08-17 DIAGNOSIS — E559 Vitamin D deficiency, unspecified: Secondary | ICD-10-CM

## 2018-08-17 DIAGNOSIS — R7303 Prediabetes: Secondary | ICD-10-CM | POA: Diagnosis not present

## 2018-08-17 MED ORDER — VITAMIN D (ERGOCALCIFEROL) 1.25 MG (50000 UNIT) PO CAPS: 50000.0000 [IU] | ORAL_CAPSULE | ORAL | 0 refills | Status: DC

## 2018-08-19 ENCOUNTER — Other Ambulatory Visit: Payer: Self-pay | Admitting: Family Medicine

## 2018-08-19 NOTE — Progress Notes (Signed)
Office: [phone]  /  Fax: [phone]   HPI:   Chief Complaint: OBESITY Alicia Knapp is here to discuss her progress with her obesity treatment plan. She is on the Category 2 plan and is following her eating plan approximately 100 % of the time. She states she is bowling for 60-90 minutes 6 times per week. Alicia Knapp is getting hungry at the end of 2 weeks, hungry when she wakes up. She is eating yogurt and ice cream sandwiches for snacks, and eating 7-8 oz meat at night.  Her weight is 204 lb (92.5 kg) today and has had a weight loss of 8 pounds over a period of 2 weeks since her last visit. She has lost 8 lbs since starting treatment with [name].  Vitamin D Deficiency Alicia Knapp has a diagnosis of vitamin D deficiency. She is on OTC Vit D 5,000 IU daily. She notes fatigue and denies nausea, vomiting or muscle weakness.  Pre-Diabetes Alicia Knapp has a diagnosis of pre-diabetes based on her elevated Hgb A1c and was informed this puts her at greater risk of developing diabetes. Hgb A1c of 5.7 with Insulin of 15.4. She notes occasional carbohydrates cravings. She is not taking metformin currently and continues to work on diet and exercise to decrease risk of diabetes. She denies nausea or hypoglycemia.  At risk for diabetes Alicia Knapp is at higher than average risk for developing diabetes due to her obesity and pre-diabetes. She currently denies polyuria or polydipsia.  ALLERGIES: Allergies  Allergen Reactions  . Erythromycin     Causes stomach cramps  . Penicillins     rash    MEDICATIONS: Current Outpatient Medications on File Prior to Visit  Medication Sig Dispense Refill  . amLODipine (NORVASC) 10 MG tablet Take 1 tablet (10 mg total) by mouth daily. 90 tablet 1  . ARIPiprazole (ABILIFY) 2 MG tablet Take 2 mg by mouth daily.     . ARIPiprazole (ABILIFY) 5 MG tablet Take 1 tablet by mouth daily.  0  . atorvastatin (LIPITOR) 10 MG tablet TAKE 1 TABLET DAILY 90 tablet 3  . carvedilol (COREG) 6.25 MG tablet  Take 1 tablet (6.25 mg total) by mouth 2 (two) times daily with a meal. 180 tablet 1  . cholecalciferol (VITAMIN D) 1000 UNITS tablet Take 1,000 Units by mouth daily.    . Cyanocobalamin (VITAMIN B 12) 100 MCG LOZG Take by mouth.    . fluticasone (FLONASE) 50 MCG/ACT nasal spray USE 2 SPRAYS IN EACH NOSTRIL DAILY 48 g 2  . hydrochlorothiazide (HYDRODIURIL) 25 MG tablet Take 1 tablet (25 mg total) by mouth daily. 90 tablet 1  . levocetirizine (XYZAL) 5 MG tablet TAKE 1 TABLET EVERY EVENING 90 tablet 0  . LORazepam (ATIVAN) 1 MG tablet Take 0.5 mg by mouth every 8 (eight) hours.     [name] losartan (COZAAR) 50 MG tablet Take 1 tablet (50 mg total) by mouth daily. 90 tablet 1   No current facility-administered medications on file prior to visit.     PAST MEDICAL HISTORY: Past Medical History:  Diagnosis Date  . Anxiety   . B12 deficiency   . Constipation   . Heart murmur   . Hyperlipidemia   . Hypertension   . Joint pain   . Postoperative nausea   . Prediabetes   . Swelling    lower ext  . Vitamin D deficiency     PAST SURGICAL HISTORY: Past Surgical History:  Procedure Laterality Date  . ABDOMINAL HYSTERECTOMY    .  APPENDECTOMY    . COLONOSCOPY  05/22/2015   Dr.Pyrtle  . POLYPECTOMY      SOCIAL HISTORY: Social History   Tobacco Use  . Smoking status: Former Smoker    Types: Cigarettes    Last attempt to quit: 09/17/2014    Years since quitting: 3.9  . Smokeless tobacco: Never Used  Substance Use Topics  . Alcohol use: No    Alcohol/week: 0.0 standard drinks  . Drug use: No    FAMILY HISTORY: Family History  Problem Relation Age of Onset  . Cancer Father        prostate  . Prostate cancer Father   . Diabetes Mother   . Thyroid disease Mother   . Hypertension Mother   . Hyperlipidemia Mother   . Kidney disease Mother   . Cancer Mother   . Sleep apnea Mother   . Obesity Mother   . Hypertension Sister   . Hypertension Brother   . Stroke Brother   . Colon  cancer Brother 51       died 13 - 4-5 months after dx  . Arthritis Maternal Grandmother   . Esophageal cancer Neg Hx   . Rectal cancer Neg Hx   . Stomach cancer Neg Hx     ROS: Review of Systems  Constitutional: Positive for malaise/fatigue and weight loss.  Gastrointestinal: Negative for nausea and vomiting.  Genitourinary: Negative for frequency.  Musculoskeletal:       Negative muscle weakness  Endo/Heme/Allergies: Negative for polydipsia.       Negative hypoglycemia    PHYSICAL EXAM: Blood pressure (!) 141/77, pulse 62, temperature 98.1 F (36.7 C), temperature source Oral, height 5\' 6"  (1.676 m), weight 204 lb (92.5 kg), SpO2 96 %. Body mass index is 32.93 kg/m. Physical Exam  Constitutional: She is oriented to person, place, and time. She appears well-developed and well-nourished.  Cardiovascular: Normal rate.  Pulmonary/Chest: Effort normal.  Musculoskeletal: Normal range of motion.  Neurological: She is oriented to person, place, and time.  Skin: Skin is warm and dry.  Psychiatric: She has a normal mood and affect. Her behavior is normal.  Vitals reviewed.   RECENT LABS AND TESTS: BMET    Component Value Date/Time   NA 140 05/18/2018 1801   K 4.3 05/18/2018 1801   CL 102 05/18/2018 1801   CO2 31 05/18/2018 1801   GLUCOSE 98 05/18/2018 1801   BUN 11 05/18/2018 1801   CREATININE 0.92 05/18/2018 1801   CALCIUM 10.0 05/18/2018 1801   GFRNONAA 134.84 04/16/2009 1252   GFRAA 85 02/21/2008 1415   Lab Results  Component Value Date   HGBA1C 5.7 05/18/2018   HGBA1C 5.6 12/21/2017   Lab Results  Component Value Date   INSULIN 15.4 08/03/2018   CBC    Component Value Date/Time   WBC 3.6 (L) 09/15/2017 1153   RBC 4.43 09/15/2017 1153   HGB 12.7 09/15/2017 1153   HCT 39.3 09/15/2017 1153   PLT 349.0 09/15/2017 1153   MCV 88.7 09/15/2017 1153   MCHC 32.3 09/15/2017 1153   RDW 14.3 09/15/2017 1153   LYMPHSABS 1.6 09/15/2017 1153   MONOABS 0.3 09/15/2017  1153   EOSABS 0.1 09/15/2017 1153   BASOSABS 0.0 09/15/2017 1153   Iron/TIBC/Ferritin/ %Sat No results found for: IRON, TIBC, FERRITIN, IRONPCTSAT Lipid Panel     Component Value Date/Time   CHOL 142 05/18/2018 1801   TRIG 106.0 05/18/2018 1801   HDL 45.50 05/18/2018 1801   CHOLHDL 3 05/18/2018  1801   VLDL 21.2 05/18/2018 1801   LDLCALC 75 05/18/2018 1801   LDLDIRECT 140.5 08/12/2013 0913   Hepatic Function Panel     Component Value Date/Time   PROT 7.6 05/18/2018 1801   ALBUMIN 4.4 05/18/2018 1801   AST 16 05/18/2018 1801   ALT 16 05/18/2018 1801   ALKPHOS 64 05/18/2018 1801   BILITOT 0.4 05/18/2018 1801   BILIDIR 0.0 02/22/2015 1206      Component Value Date/Time   TSH 1.760 08/03/2018 1324   TSH 1.81 09/15/2017 1153   TSH 1.16 12/15/2016 0904  Results for BELVIE, IRIBE (MRN [id number]) as of 08/19/2018 08:21  Ref. Range 08/03/2018 13:24  Vitamin D, 25-Hydroxy Latest Ref Range: 30.0 - 100.0 ng/mL 33.3    ASSESSMENT AND PLAN: Vitamin D deficiency - Plan: Vitamin D, Ergocalciferol, (DRISDOL) 50000 units CAPS capsule  Prediabetes  At risk for diabetes mellitus  Class 1 obesity with serious comorbidity and body mass index (BMI) of 33.0 to 33.9 in adult, unspecified obesity type  PLAN:  Vitamin D Deficiency Alicia Knapp was informed that low vitamin D levels contributes to fatigue and are associated with obesity, breast, and colon cancer. Kendyll agrees to stop OTC Vit D and she agrees to start prescription Vit D @50 ,000 IU every week #4 with no refills. She will follow up for routine testing of vitamin D, at least 2-3 times per year. She was informed of the risk of over-replacement of vitamin D and agrees to not increase her dose unless she discusses this with [name] first. Jawana agrees to follow up with our clinic in 2 weeks.  Pre-Diabetes Alicia Knapp will continue to work on weight loss, exercise, and decreasing simple carbohydrates in her diet to help decrease the risk of diabetes. We  dicussed metformin including benefits and risks. She was informed that eating too many simple carbohydrates or too many calories at one sitting increases the likelihood of GI side effects. Kambree declined metformin for now and a prescription was not written today. We will retest labs in 3 months. Yi agrees to follow up with our clinic in 2 weeks as directed to monitor her progress.  Diabetes risk counselling Alicia Knapp was given extended (30 minutes) diabetes prevention counseling today. She is 62 y.o. female and has risk factors for diabetes including obesity and pre-diabetes. We discussed intensive lifestyle modifications today with an emphasis on weight loss as well as increasing exercise and decreasing simple carbohydrates in her diet.  Obesity Alicia Knapp is currently in the action stage of change. As such, her goal is to continue with weight loss efforts She has agreed to follow the Category 2 plan + 100 calories Alicia Knapp has been instructed to work up to a goal of 150 minutes of combined cardio and strengthening exercise per week for weight loss and overall health benefits. We discussed the following Behavioral Modification Strategies today: increasing lean protein intake, increasing vegetables, work on meal planning and easy cooking plans, better snacking choices, and planning for success   Alicia Knapp has agreed to follow up with our clinic in 2 weeks. She was informed of the importance of frequent follow up visits to maximize her success with intensive lifestyle modifications for her multiple health conditions.   OBESITY BEHAVIORAL INTERVENTION VISIT  Today's visit was # 2   Starting weight: 212 lbs Starting date: 08/03/18 Today's weight : 204 lbs  Today's date: 08/17/2018 Total lbs lost to date: 8    ASK: We discussed the diagnosis of obesity with Alicia Knapp today  and Alicia Knapp agreed to give [name] permission to discuss obesity behavioral modification therapy today.  ASSESS: Alicia Knapp has the diagnosis of  obesity and her BMI today is 32.94 Alicia Knapp is in the action stage of change   ADVISE: Alicia Knapp was educated on the multiple health risks of obesity as well as the benefit of weight loss to improve her health. She was advised of the need for long term treatment and the importance of lifestyle modifications to improve her current health and to decrease her risk of future health problems.  AGREE: Multiple dietary modification options and treatment options were discussed and  Alicia Knapp agreed to follow the recommendations documented in the above note.  ARRANGE: Alicia Knapp was educated on the importance of frequent visits to treat obesity as outlined per CMS and USPSTF guidelines and agreed to schedule her next follow up appointment today.  I, Trixie Dredge, am acting as transcriptionist for Ilene Qua, MD  I have reviewed the above documentation for accuracy and completeness, and I agree with the above. - Ilene Qua, MD

## 2018-09-02 ENCOUNTER — Ambulatory Visit (INDEPENDENT_AMBULATORY_CARE_PROVIDER_SITE_OTHER): Admitting: Bariatrics

## 2018-09-02 VITALS — BP 146/80 | HR 64 | Temp 97.6°F | Ht 66.0 in | Wt 202.0 lb

## 2018-09-02 DIAGNOSIS — E559 Vitamin D deficiency, unspecified: Secondary | ICD-10-CM

## 2018-09-02 DIAGNOSIS — E66811 Obesity, class 1: Secondary | ICD-10-CM | POA: Insufficient documentation

## 2018-09-02 DIAGNOSIS — I10 Essential (primary) hypertension: Secondary | ICD-10-CM | POA: Diagnosis not present

## 2018-09-02 DIAGNOSIS — Z6832 Body mass index (BMI) 32.0-32.9, adult: Secondary | ICD-10-CM

## 2018-09-02 DIAGNOSIS — E669 Obesity, unspecified: Secondary | ICD-10-CM

## 2018-09-02 DIAGNOSIS — Z9189 Other specified personal risk factors, not elsewhere classified: Secondary | ICD-10-CM

## 2018-09-02 DIAGNOSIS — Z6830 Body mass index (BMI) 30.0-30.9, adult: Secondary | ICD-10-CM

## 2018-09-02 MED ORDER — VITAMIN D (ERGOCALCIFEROL) 1.25 MG (50000 UNIT) PO CAPS: 50000.0000 [IU] | ORAL_CAPSULE | ORAL | 0 refills | Status: DC

## 2018-09-02 NOTE — Progress Notes (Signed)
Office: [phone]  /  Fax: [phone]   HPI:  Chief Complaint: OBESITY Alicia Knapp is here to discuss her progress with her obesity treatment plan. She is on the  follow the Category 2 plan +100 calories and is following her eating plan approximately 100 % of the time. She states she is exercising 0 minutes 0 times per week. Alicia Knapp is not having any challenges. She has taken her 100 calorie snacks. Her appetite is appropriate.  Her weight is 202 lb (91.6 kg) today and has had a weight loss of 2 pounds over a period of 2 weeks since her last visit. She has lost 10 lbs since starting treatment with [name].  Vitamin D deficiency Alicia Knapp has a diagnosis of vitamin D deficiency. She is currently taking vit D and denies nausea, vomiting or muscle weakness.   Ref. Range 08/03/2018 13:24  Vitamin D, 25-Hydroxy Latest Ref Range: 30.0 - 100.0 ng/mL 33.3   Hypertension Alicia Knapp is a 62 y.o. female with hypertension.  Alicia Knapp denies chest pain, lightheadedness, or shortness of breath on exertion. She is working weight loss to help control her blood pressure with the goal of decreasing her risk of heart attack and stroke. Alicia Knapp blood pressure is currently reasonably well controlled. She is currently taking coreg and HCTZ.   At risk for osteopenia and osteoporosis Alicia Knapp is at higher risk of osteopenia and osteoporosis due to vitamin D deficiency.    ALLERGIES: Allergies  Allergen Reactions  . Erythromycin     Causes stomach cramps  . Penicillins     rash    MEDICATIONS: Current Outpatient Medications on File Prior to Visit  Medication Sig Dispense Refill  . amLODipine (NORVASC) 10 MG tablet Take 1 tablet (10 mg total) by mouth daily. 90 tablet 1  . ARIPiprazole (ABILIFY) 2 MG tablet Take 2 mg by mouth daily.     . ARIPiprazole (ABILIFY) 5 MG tablet Take 1 tablet by mouth daily.  0  . atorvastatin (LIPITOR) 10 MG tablet TAKE 1 TABLET DAILY 90 tablet 3  . carvedilol (COREG) 6.25 MG tablet Take 1  tablet (6.25 mg total) by mouth 2 (two) times daily with a meal. 180 tablet 1  . cholecalciferol (VITAMIN D) 1000 UNITS tablet Take 1,000 Units by mouth daily.    . Cyanocobalamin (VITAMIN B 12) 100 MCG LOZG Take by mouth.    . fluticasone (FLONASE) 50 MCG/ACT nasal spray USE 2 SPRAYS IN EACH NOSTRIL DAILY 48 g 2  . hydrochlorothiazide (HYDRODIURIL) 25 MG tablet Take 1 tablet (25 mg total) by mouth daily. 90 tablet 1  . levocetirizine (XYZAL) 5 MG tablet Take 1 tablet (5 mg total) by mouth every evening. 90 tablet 3  . LORazepam (ATIVAN) 1 MG tablet Take 0.5 mg by mouth every 8 (eight) hours.     [name] losartan (COZAAR) 50 MG tablet Take 1 tablet (50 mg total) by mouth daily. 90 tablet 1   No current facility-administered medications on file prior to visit.     PAST MEDICAL HISTORY: Past Medical History:  Diagnosis Date  . Anxiety   . B12 deficiency   . Constipation   . Heart murmur   . Hyperlipidemia   . Hypertension   . Joint pain   . Postoperative nausea   . Prediabetes   . Swelling    lower ext  . Vitamin D deficiency     PAST SURGICAL HISTORY: Past Surgical History:  Procedure Laterality Date  . ABDOMINAL HYSTERECTOMY    .  APPENDECTOMY    . COLONOSCOPY  05/22/2015   Dr.Pyrtle  . POLYPECTOMY      SOCIAL HISTORY: Social History   Tobacco Use  . Smoking status: Former Smoker    Types: Cigarettes    Last attempt to quit: 09/17/2014    Years since quitting: 3.9  . Smokeless tobacco: Never Used  Substance Use Topics  . Alcohol use: No    Alcohol/week: 0.0 standard drinks  . Drug use: No    FAMILY HISTORY: Family History  Problem Relation Age of Onset  . Cancer Father        prostate  . Prostate cancer Father   . Diabetes Mother   . Thyroid disease Mother   . Hypertension Mother   . Hyperlipidemia Mother   . Kidney disease Mother   . Cancer Mother   . Sleep apnea Mother   . Obesity Mother   . Hypertension Sister   . Hypertension Brother   . Stroke  Brother   . Colon cancer Brother 23       died 43 - 4-5 months after dx  . Arthritis Maternal Grandmother   . Esophageal cancer Neg Hx   . Rectal cancer Neg Hx   . Stomach cancer Neg Hx     ROS: Review of Systems  Constitutional: Positive for weight loss.  Respiratory: Negative for shortness of breath.   Cardiovascular: Negative for chest pain.  Gastrointestinal: Negative for nausea and vomiting.  Musculoskeletal:       Negative for muscle weakness  Neurological:       Negative for lightheadedness    PHYSICAL EXAM: Blood pressure (!) 146/80, pulse 64, temperature 97.6 F (36.4 C), temperature source Oral, height 5\' 6"  (1.676 m), weight 202 lb (91.6 kg), SpO2 99 %. Body mass index is 32.6 kg/m. Physical Exam  Constitutional: She is oriented to person, place, and time. She appears well-developed and well-nourished.  HENT:  Head: Normocephalic.  Neck: Normal range of motion.  Cardiovascular: Normal rate.  Pulmonary/Chest: Effort normal.  Musculoskeletal: Normal range of motion.  Neurological: She is alert and oriented to person, place, and time.  Skin: Skin is warm and dry.  Psychiatric: She has a normal mood and affect. Her behavior is normal.  Vitals reviewed.   RECENT LABS AND TESTS: BMET    Component Value Date/Time   NA 140 05/18/2018 1801   K 4.3 05/18/2018 1801   CL 102 05/18/2018 1801   CO2 31 05/18/2018 1801   GLUCOSE 98 05/18/2018 1801   BUN 11 05/18/2018 1801   CREATININE 0.92 05/18/2018 1801   CALCIUM 10.0 05/18/2018 1801   GFRNONAA 134.84 04/16/2009 1252   GFRAA 85 02/21/2008 1415   Lab Results  Component Value Date   HGBA1C 5.7 05/18/2018   HGBA1C 5.6 12/21/2017   Lab Results  Component Value Date   INSULIN 15.4 08/03/2018   CBC    Component Value Date/Time   WBC 3.6 (L) 09/15/2017 1153   RBC 4.43 09/15/2017 1153   HGB 12.7 09/15/2017 1153   HCT 39.3 09/15/2017 1153   PLT 349.0 09/15/2017 1153   MCV 88.7 09/15/2017 1153   MCHC 32.3  09/15/2017 1153   RDW 14.3 09/15/2017 1153   LYMPHSABS 1.6 09/15/2017 1153   MONOABS 0.3 09/15/2017 1153   EOSABS 0.1 09/15/2017 1153   BASOSABS 0.0 09/15/2017 1153   Iron/TIBC/Ferritin/ %Sat No results found for: IRON, TIBC, FERRITIN, IRONPCTSAT Lipid Panel     Component Value Date/Time   CHOL 142  05/18/2018 1801   TRIG 106.0 05/18/2018 1801   HDL 45.50 05/18/2018 1801   CHOLHDL 3 05/18/2018 1801   VLDL 21.2 05/18/2018 1801   LDLCALC 75 05/18/2018 1801   LDLDIRECT 140.5 08/12/2013 0913   Hepatic Function Panel     Component Value Date/Time   PROT 7.6 05/18/2018 1801   ALBUMIN 4.4 05/18/2018 1801   AST 16 05/18/2018 1801   ALT 16 05/18/2018 1801   ALKPHOS 64 05/18/2018 1801   BILITOT 0.4 05/18/2018 1801   BILIDIR 0.0 02/22/2015 1206      Component Value Date/Time   TSH 1.760 08/03/2018 1324   TSH 1.81 09/15/2017 1153   TSH 1.16 12/15/2016 0904     Ref. Range 08/03/2018 13:24  Vitamin D, 25-Hydroxy Latest Ref Range: 30.0 - 100.0 ng/mL 33.3   ASSESSMENT AND PLAN: Vitamin D deficiency - Plan: Vitamin D, Ergocalciferol, (DRISDOL) 50000 units CAPS capsule  Essential hypertension  At risk for osteoporosis  Class 1 obesity with serious comorbidity and body mass index (BMI) of 32.0 to 32.9 in adult, unspecified obesity type  PLAN: Vitamin D Deficiency Alicia Knapp was informed that low vitamin D levels contributes to fatigue and are associated with obesity, breast, and colon cancer. She agrees to continue to take prescription Vit D @50 ,000 IU every week #4 with no refills and will follow up for routine testing of vitamin D, at least 2-3 times per year. She was informed of the risk of over-replacement of vitamin D and agrees to not increase her dose unless she discusses this with [name] first. Agrees to follow up with our clinic as directed.   Hypertension We discussed sodium restriction, working on healthy weight loss, and a regular exercise program as the means to achieve improved  blood pressure control. Savanah agreed with this plan and agreed to follow up as directed. We will continue to monitor her blood pressure as well as her progress with the above lifestyle modifications. She will continue her medications as prescribed and will watch for signs of hypotension as she continues her lifestyle modifications.  At risk for osteopenia and osteoporosis Alicia Knapp was given extended  (15 minutes) osteoporosis prevention counseling today. Alicia Knapp is at risk for osteopenia and osteoporosis due to her vitamin D deficiency. She was encouraged to take her vitamin D and follow her higher calcium diet and increase strengthening exercise to help strengthen her bones and decrease her risk of osteopenia and osteoporosis.  Obesity Alicia Knapp is currently in the action stage of change. As such, her goal is to continue with weight loss efforts She has agreed to follow the Category 2 plan  She was giving a Thanksgiving Handout.  Alicia Knapp has been instructed to work up to a goal of 150 minutes of combined cardio and strengthening exercise or constant exercise, she will start yoga per week for weight loss and overall health benefits. We discussed the following Behavioral Modification Strategies today: increasing lean protein intake, decreasing simple carbohydrates , increasing vegetables, decrease eating out, increasing water intake, no skipping meals, keeping healthy foods in the home, and work on meal planning and easy cooking plans.    Alicia Knapp has agreed to follow up with our clinic in 2 weeks. She was informed of the importance of frequent follow up visits to maximize her success with intensive lifestyle modifications for her multiple health conditions.   OBESITY BEHAVIORAL INTERVENTION VISIT  Today's visit was # 3   Starting weight: 212 lb Starting date: 08/03/18 Today's weight : 202 lb Today's date:  09/02/2018 Total lbs lost to date: 10 lb    ASK: We discussed the diagnosis of obesity with Alicia Knapp today and Alicia Knapp agreed to give [name] permission to discuss obesity behavioral modification therapy today.  ASSESS: Celise has the diagnosis of obesity and her BMI today is 32.62 Coleen is in the action stage of change   ADVISE: Alicia Knapp was educated on the multiple health risks of obesity as well as the benefit of weight loss to improve her health. She was advised of the need for long term treatment and the importance of lifestyle modifications to improve her current health and to decrease her risk of future health problems.  AGREE: Multiple dietary modification options and treatment options were discussed and  Alicia Knapp agreed to follow the recommendations documented in the above note.  ARRANGE: Sheronda was educated on the importance of frequent visits to treat obesity as outlined per CMS and USPSTF guidelines and agreed to schedule her next follow up appointment today.  Alicia Knapp, am acting as transcriptionist for [employer], DO   I have reviewed the above documentation for accuracy and completeness, and I agree with the above. -Jearld Lesch, DO

## 2018-09-15 ENCOUNTER — Other Ambulatory Visit: Payer: Self-pay | Admitting: Family Medicine

## 2018-09-16 ENCOUNTER — Ambulatory Visit (INDEPENDENT_AMBULATORY_CARE_PROVIDER_SITE_OTHER): Admitting: Family Medicine

## 2018-09-16 VITALS — BP 126/76 | HR 60 | Temp 97.7°F | Ht 66.0 in | Wt 200.0 lb

## 2018-09-16 DIAGNOSIS — Z6832 Body mass index (BMI) 32.0-32.9, adult: Secondary | ICD-10-CM

## 2018-09-16 DIAGNOSIS — Z9189 Other specified personal risk factors, not elsewhere classified: Secondary | ICD-10-CM

## 2018-09-16 DIAGNOSIS — R7303 Prediabetes: Secondary | ICD-10-CM | POA: Diagnosis not present

## 2018-09-16 DIAGNOSIS — E669 Obesity, unspecified: Secondary | ICD-10-CM

## 2018-09-16 DIAGNOSIS — E559 Vitamin D deficiency, unspecified: Secondary | ICD-10-CM

## 2018-09-16 MED ORDER — VITAMIN D (ERGOCALCIFEROL) 1.25 MG (50000 UNIT) PO CAPS: 50000.0000 [IU] | ORAL_CAPSULE | ORAL | 0 refills | Status: DC

## 2018-09-20 ENCOUNTER — Other Ambulatory Visit: Payer: Self-pay | Admitting: *Deleted

## 2018-09-20 DIAGNOSIS — J302 Other seasonal allergic rhinitis: Secondary | ICD-10-CM

## 2018-09-20 MED ORDER — FLUTICASONE PROPIONATE 50 MCG/ACT NA SUSP: 2.0000 | Freq: Every day | NASAL | 2 refills | Status: DC

## 2018-09-21 NOTE — Progress Notes (Signed)
Office: [phone]  /  Fax: [phone]   HPI:   Chief Complaint: OBESITY Alicia Knapp is here to discuss her progress with her obesity treatment plan. She is on the Category 2 plan and is following her eating plan approximately 100 % of the time. She states she is bowling and doing yoga 45 minutes 3 times per week. Alicia Knapp has started yoga 2 times a week and is really enjoying it. She denies cravings and hunger. She is eating 6 to 7 ounces of protein at dinner and using yogurt and mini ice cream sandwiches for sweet indulgences. She wants to indulge a bit at Thanksgiving.  Her weight is 200 lb (90.7 kg) today and has had a weight loss of 2 pounds over a period of 2 weeks since her last visit. She has lost 12 lbs since starting treatment with [name].  Vitamin D deficiency Alicia Knapp has a diagnosis of vitamin D deficiency. She is currently taking vit D. She admits fatigue and denies nausea, vomiting, or muscle weakness.  At risk for osteopenia and osteoporosis Alicia Knapp is at higher risk of osteopenia and osteoporosis due to vitamin D deficiency.   Pre-Diabetes Alicia Knapp has a diagnosis of pre-diabetes based on her elevated Hgb A1c and was informed this puts her at greater risk of developing diabetes. She has cravings for Thanksgiving food like sweet potatoes. She is not taking metformin currently and continues to work on diet and exercise to decrease risk of diabetes.   ALLERGIES: Allergies  Allergen Reactions  . Erythromycin     Causes stomach cramps  . Penicillins     rash    MEDICATIONS: Current Outpatient Medications on File Prior to Visit  Medication Sig Dispense Refill  . ARIPiprazole (ABILIFY) 2 MG tablet Take 2 mg by mouth daily.     . ARIPiprazole (ABILIFY) 5 MG tablet Take 1 tablet by mouth daily.  0  . atorvastatin (LIPITOR) 10 MG tablet TAKE 1 TABLET DAILY 90 tablet 3  . carvedilol (COREG) 6.25 MG tablet Take 1 tablet (6.25 mg total) by mouth 2 (two) times daily with a meal. 180 tablet 1  .  cholecalciferol (VITAMIN D) 1000 UNITS tablet Take 1,000 Units by mouth daily.    . Cyanocobalamin (VITAMIN B 12) 100 MCG LOZG Take by mouth.    . hydrochlorothiazide (HYDRODIURIL) 25 MG tablet Take 1 tablet (25 mg total) by mouth daily. 90 tablet 1  . levocetirizine (XYZAL) 5 MG tablet Take 1 tablet (5 mg total) by mouth every evening. 90 tablet 3  . LORazepam (ATIVAN) 1 MG tablet Take 0.5 mg by mouth every 8 (eight) hours.     [name] losartan (COZAAR) 50 MG tablet Take 1 tablet (50 mg total) by mouth daily. 90 tablet 1  . amLODipine (NORVASC) 10 MG tablet TAKE 1 TABLET DAILY 90 tablet 4   No current facility-administered medications on file prior to visit.     PAST MEDICAL HISTORY: Past Medical History:  Diagnosis Date  . Anxiety   . B12 deficiency   . Constipation   . Heart murmur   . Hyperlipidemia   . Hypertension   . Joint pain   . Postoperative nausea   . Prediabetes   . Swelling    lower ext  . Vitamin D deficiency     PAST SURGICAL HISTORY: Past Surgical History:  Procedure Laterality Date  . ABDOMINAL HYSTERECTOMY    . APPENDECTOMY    . COLONOSCOPY  05/22/2015   Dr.Pyrtle  . POLYPECTOMY  SOCIAL HISTORY: Social History   Tobacco Use  . Smoking status: Former Smoker    Types: Cigarettes    Last attempt to quit: 09/17/2014    Years since quitting: 4.0  . Smokeless tobacco: Never Used  Substance Use Topics  . Alcohol use: No    Alcohol/week: 0.0 standard drinks  . Drug use: No    FAMILY HISTORY: Family History  Problem Relation Age of Onset  . Cancer Father        prostate  . Prostate cancer Father   . Diabetes Mother   . Thyroid disease Mother   . Hypertension Mother   . Hyperlipidemia Mother   . Kidney disease Mother   . Cancer Mother   . Sleep apnea Mother   . Obesity Mother   . Hypertension Sister   . Hypertension Brother   . Stroke Brother   . Colon cancer Brother 87       died 7 - 4-5 months after dx  . Arthritis Maternal Grandmother    . Esophageal cancer Neg Hx   . Rectal cancer Neg Hx   . Stomach cancer Neg Hx     ROS: Review of Systems  Constitutional: Positive for weight loss.  Gastrointestinal: Negative for nausea and vomiting.  Musculoskeletal:       Negative for muscle weakness.    PHYSICAL EXAM: Blood pressure 126/76, pulse 60, temperature 97.7 F (36.5 C), temperature source Oral, height 5\' 6"  (1.676 m), weight 200 lb (90.7 kg), SpO2 100 %. Body mass index is 32.28 kg/m. Physical Exam  Constitutional: She is oriented to person, place, and time. She appears well-developed and well-nourished.  Cardiovascular: Normal rate.  Pulmonary/Chest: Effort normal.  Musculoskeletal: Normal range of motion.  Neurological: She is oriented to person, place, and time.  Skin: Skin is warm and dry.  Psychiatric: She has a normal mood and affect. Her behavior is normal.  Vitals reviewed.   RECENT LABS AND TESTS: BMET    Component Value Date/Time   NA 140 05/18/2018 1801   K 4.3 05/18/2018 1801   CL 102 05/18/2018 1801   CO2 31 05/18/2018 1801   GLUCOSE 98 05/18/2018 1801   BUN 11 05/18/2018 1801   CREATININE 0.92 05/18/2018 1801   CALCIUM 10.0 05/18/2018 1801   GFRNONAA 134.84 04/16/2009 1252   GFRAA 85 02/21/2008 1415   Lab Results  Component Value Date   HGBA1C 5.7 05/18/2018   HGBA1C 5.6 12/21/2017   Lab Results  Component Value Date   INSULIN 15.4 08/03/2018   CBC    Component Value Date/Time   WBC 3.6 (L) 09/15/2017 1153   RBC 4.43 09/15/2017 1153   HGB 12.7 09/15/2017 1153   HCT 39.3 09/15/2017 1153   PLT 349.0 09/15/2017 1153   MCV 88.7 09/15/2017 1153   MCHC 32.3 09/15/2017 1153   RDW 14.3 09/15/2017 1153   LYMPHSABS 1.6 09/15/2017 1153   MONOABS 0.3 09/15/2017 1153   EOSABS 0.1 09/15/2017 1153   BASOSABS 0.0 09/15/2017 1153   Iron/TIBC/Ferritin/ %Sat No results found for: IRON, TIBC, FERRITIN, IRONPCTSAT Lipid Panel     Component Value Date/Time   CHOL 142 05/18/2018 1801     TRIG 106.0 05/18/2018 1801   HDL 45.50 05/18/2018 1801   CHOLHDL 3 05/18/2018 1801   VLDL 21.2 05/18/2018 1801   LDLCALC 75 05/18/2018 1801   LDLDIRECT 140.5 08/12/2013 0913   Hepatic Function Panel     Component Value Date/Time   PROT 7.6 05/18/2018 1801  ALBUMIN 4.4 05/18/2018 1801   AST 16 05/18/2018 1801   ALT 16 05/18/2018 1801   ALKPHOS 64 05/18/2018 1801   BILITOT 0.4 05/18/2018 1801   BILIDIR 0.0 02/22/2015 1206      Component Value Date/Time   TSH 1.760 08/03/2018 1324   TSH 1.81 09/15/2017 1153   TSH 1.16 12/15/2016 0904   Results for SHELBYLYNN, WALCZYK (MRN [id number]) as of 09/21/2018 15:42  Ref. Range 08/03/2018 13:24  Vitamin D, 25-Hydroxy Latest Ref Range: 30.0 - 100.0 ng/mL 33.3   ASSESSMENT AND PLAN: Vitamin D deficiency - Plan: Vitamin D, Ergocalciferol, (DRISDOL) 1.25 MG (50000 UT) CAPS capsule  Prediabetes  At risk for osteoporosis  Class 1 obesity with serious comorbidity and body mass index (BMI) of 32.0 to 32.9 in adult, unspecified obesity type  PLAN:  Vitamin D Deficiency Alicia Knapp was informed that low vitamin D levels contributes to fatigue and are associated with obesity, breast, and colon cancer. She agrees to continue to take prescription Vit D @50 ,000 IU every week and will follow up for routine testing of vitamin D, at least 2-3 times per year. She was informed of the risk of over-replacement of vitamin D and agrees to not increase her dose unless she discusses this with [name] first. Alicia Knapp agrees to follow up in 2 weeks.  At risk for osteopenia and osteoporosis Alicia Knapp was given extended (15 minutes) osteoporosis prevention counseling today. Alicia Knapp is at risk for osteopenia and osteoporosis due to her vitamin D deficiency. She was encouraged to take her vitamin D and follow her higher calcium diet and increase strengthening exercise to help strengthen her bones and decrease her risk of osteopenia and osteoporosis.  Pre-Diabetes Alicia Knapp will continue to work on  weight loss, exercise, and decreasing simple carbohydrates in her diet to help decrease the risk of diabetes. She was informed that eating too many simple carbohydrates or too many calories at one sitting increases the likelihood of GI side effects.We will repeat labs in early January and Alicia Knapp agreed to follow up with [name] as directed to monitor her progress.  Obesity Alicia Knapp is currently in the action stage of change. As such, her goal is to continue with weight loss efforts. She has agreed to follow the Category 2 plan. Alicia Knapp has been instructed to work up to a goal of 150 minutes of combined cardio and strengthening exercise per week for weight loss and overall health benefits. We discussed the following Behavioral Modification Strategies today: increasing lean protein intake, increasing vegetables, work on meal planning and easy cooking plans, and planning for success.  Alicia Knapp has agreed to follow up with our clinic in 2 weeks. She was informed of the importance of frequent follow up visits to maximize her success with intensive lifestyle modifications for her multiple health conditions.   OBESITY BEHAVIORAL INTERVENTION VISIT  Today's visit was # 4   Starting weight: 212 lbs Starting date: 08/03/18 Today's weight : Weight: 200 lb (90.7 kg)  Today's date: 09/16/2018 Total lbs lost to date: 12  ASK: We discussed the diagnosis of obesity with Alicia Knapp today and Alicia Knapp agreed to give [name] permission to discuss obesity behavioral modification therapy today.  ASSESS: Alicia Knapp has the diagnosis of obesity and her BMI today is 32.3 Alicia Knapp is in the action stage of change.   ADVISE: Alicia Knapp was educated on the multiple health risks of obesity as well as the benefit of weight loss to improve her health. She was advised of the need for long term treatment  and the importance of lifestyle modifications to improve her current health and to decrease her risk of future health problems.  AGREE: Multiple dietary  modification options and treatment options were discussed and Alicia Knapp agreed to follow the recommendations documented in the above note.  ARRANGE: Alicia Knapp was educated on the importance of frequent visits to treat obesity as outlined per CMS and USPSTF guidelines and agreed to schedule her next follow up appointment today.  I, Marcille Blanco, am acting as Location manager for Eber Jones, MD  I have reviewed the above documentation for accuracy and completeness, and I agree with the above. - Ilene Qua, MD

## 2018-10-05 ENCOUNTER — Ambulatory Visit (INDEPENDENT_AMBULATORY_CARE_PROVIDER_SITE_OTHER): Admitting: Family Medicine

## 2018-10-05 VITALS — BP 125/74 | HR 59 | Temp 97.7°F | Ht 66.0 in | Wt 198.0 lb

## 2018-10-05 DIAGNOSIS — Z6832 Body mass index (BMI) 32.0-32.9, adult: Secondary | ICD-10-CM

## 2018-10-05 DIAGNOSIS — E559 Vitamin D deficiency, unspecified: Secondary | ICD-10-CM | POA: Diagnosis not present

## 2018-10-05 DIAGNOSIS — Z9189 Other specified personal risk factors, not elsewhere classified: Secondary | ICD-10-CM | POA: Diagnosis not present

## 2018-10-05 DIAGNOSIS — I10 Essential (primary) hypertension: Secondary | ICD-10-CM

## 2018-10-05 DIAGNOSIS — E669 Obesity, unspecified: Secondary | ICD-10-CM | POA: Diagnosis not present

## 2018-10-05 MED ORDER — VITAMIN D (ERGOCALCIFEROL) 1.25 MG (50000 UNIT) PO CAPS: 50000.0000 [IU] | ORAL_CAPSULE | ORAL | 0 refills | Status: DC

## 2018-10-07 NOTE — Progress Notes (Signed)
Office: [phone]  /  Fax: [phone]   HPI:   Chief Complaint: OBESITY Odilia is here to discuss her progress with her obesity treatment plan. She is on the Category 2 plan and is following her eating plan approximately 95 % of the time. She states she is bowling and doing yoga 40 minutes 3 times per week. Cohen was anxious for Thanksgiving dinner, for sweet potato pie; but she only took a few bites and she felt disappointed. Amethyst had twenty six people at her house for Thanksgiving. Akirah is supposed to go out December 7th for her husbands birthday. Her weight is 198 lb (89.8 kg) today and has had a weight loss of 2 pounds over a period of 2 to 3 weeks since her last visit. She has lost 14 lbs since starting treatment with [name].  Vitamin D deficiency Seraiah has a diagnosis of vitamin D deficiency. Mayeli is currently taking vit D and she admits to fatigue, but denies nausea, vomiting or muscle weakness.  At risk for osteopenia and osteoporosis Berlinda is at higher risk of osteopenia and osteoporosis due to vitamin D deficiency.   Hypertension Aisha Greenberger is a 62 y.o. female with hypertension. Montanna Mcbain denies chest pain, chest pressure, headache or dizziness. She is working weight loss to help control her blood pressure with the goal of decreasing her risk of heart attack and stroke. [name] blood pressure is controlled today.  ALLERGIES: Allergies  Allergen Reactions  . Erythromycin     Causes stomach cramps  . Penicillins     rash    MEDICATIONS: Current Outpatient Medications on File Prior to Visit  Medication Sig Dispense Refill  . amLODipine (NORVASC) 10 MG tablet TAKE 1 TABLET DAILY 90 tablet 4  . ARIPiprazole (ABILIFY) 2 MG tablet Take 2 mg by mouth daily.     . ARIPiprazole (ABILIFY) 5 MG tablet Take 1 tablet by mouth daily.  0  . atorvastatin (LIPITOR) 10 MG tablet TAKE 1 TABLET DAILY 90 tablet 3  . carvedilol (COREG) 6.25 MG tablet Take 1 tablet (6.25 mg total) by mouth 2  (two) times daily with a meal. 180 tablet 1  . cholecalciferol (VITAMIN D) 1000 UNITS tablet Take 1,000 Units by mouth daily.    . Cyanocobalamin (VITAMIN B 12) 100 MCG LOZG Take by mouth.    . fluticasone (FLONASE) 50 MCG/ACT nasal spray Place 2 sprays into both nostrils daily. 48 g 2  . hydrochlorothiazide (HYDRODIURIL) 25 MG tablet Take 1 tablet (25 mg total) by mouth daily. 90 tablet 1  . levocetirizine (XYZAL) 5 MG tablet Take 1 tablet (5 mg total) by mouth every evening. 90 tablet 3  . LORazepam (ATIVAN) 1 MG tablet Take 0.5 mg by mouth every 8 (eight) hours.     [name] losartan (COZAAR) 50 MG tablet Take 1 tablet (50 mg total) by mouth daily. 90 tablet 1   No current facility-administered medications on file prior to visit.     PAST MEDICAL HISTORY: Past Medical History:  Diagnosis Date  . Anxiety   . B12 deficiency   . Constipation   . Heart murmur   . Hyperlipidemia   . Hypertension   . Joint pain   . Postoperative nausea   . Prediabetes   . Swelling    lower ext  . Vitamin D deficiency     PAST SURGICAL HISTORY: Past Surgical History:  Procedure Laterality Date  . ABDOMINAL HYSTERECTOMY    . APPENDECTOMY    .  COLONOSCOPY  05/22/2015   Dr.Pyrtle  . POLYPECTOMY      SOCIAL HISTORY: Social History   Tobacco Use  . Smoking status: Former Smoker    Types: Cigarettes    Last attempt to quit: 09/17/2014    Years since quitting: 4.0  . Smokeless tobacco: Never Used  Substance Use Topics  . Alcohol use: No    Alcohol/week: 0.0 standard drinks  . Drug use: No    FAMILY HISTORY: Family History  Problem Relation Age of Onset  . Cancer Father        prostate  . Prostate cancer Father   . Diabetes Mother   . Thyroid disease Mother   . Hypertension Mother   . Hyperlipidemia Mother   . Kidney disease Mother   . Cancer Mother   . Sleep apnea Mother   . Obesity Mother   . Hypertension Sister   . Hypertension Brother   . Stroke Brother   . Colon cancer Brother  40       died 42 - 4-5 months after dx  . Arthritis Maternal Grandmother   . Esophageal cancer Neg Hx   . Rectal cancer Neg Hx   . Stomach cancer Neg Hx     ROS: Review of Systems  Constitutional: Positive for malaise/fatigue and weight loss.  Cardiovascular: Negative for chest pain.       Negative for chest pressure  Gastrointestinal: Negative for nausea and vomiting.  Musculoskeletal:       Negative for muscle weakness  Neurological: Negative for dizziness and headaches.    PHYSICAL EXAM: Blood pressure 125/74, pulse (!) 59, temperature 97.7 F (36.5 C), temperature source Oral, height 5\' 6"  (1.676 m), weight 198 lb (89.8 kg), SpO2 99 %. Body mass index is 31.96 kg/m. Physical Exam  Constitutional: She is oriented to person, place, and time. She appears well-developed and well-nourished.  Cardiovascular: Normal rate.  Pulmonary/Chest: Effort normal.  Musculoskeletal: Normal range of motion.  Neurological: She is oriented to person, place, and time.  Skin: Skin is warm and dry.  Psychiatric: She has a normal mood and affect. Her behavior is normal.  Vitals reviewed.   RECENT LABS AND TESTS: BMET    Component Value Date/Time   NA 140 05/18/2018 1801   K 4.3 05/18/2018 1801   CL 102 05/18/2018 1801   CO2 31 05/18/2018 1801   GLUCOSE 98 05/18/2018 1801   BUN 11 05/18/2018 1801   CREATININE 0.92 05/18/2018 1801   CALCIUM 10.0 05/18/2018 1801   GFRNONAA 134.84 04/16/2009 1252   GFRAA 85 02/21/2008 1415   Lab Results  Component Value Date   HGBA1C 5.7 05/18/2018   HGBA1C 5.6 12/21/2017   Lab Results  Component Value Date   INSULIN 15.4 08/03/2018   CBC    Component Value Date/Time   WBC 3.6 (L) 09/15/2017 1153   RBC 4.43 09/15/2017 1153   HGB 12.7 09/15/2017 1153   HCT 39.3 09/15/2017 1153   PLT 349.0 09/15/2017 1153   MCV 88.7 09/15/2017 1153   MCHC 32.3 09/15/2017 1153   RDW 14.3 09/15/2017 1153   LYMPHSABS 1.6 09/15/2017 1153   MONOABS 0.3  09/15/2017 1153   EOSABS 0.1 09/15/2017 1153   BASOSABS 0.0 09/15/2017 1153   Iron/TIBC/Ferritin/ %Sat No results found for: IRON, TIBC, FERRITIN, IRONPCTSAT Lipid Panel     Component Value Date/Time   CHOL 142 05/18/2018 1801   TRIG 106.0 05/18/2018 1801   HDL 45.50 05/18/2018 1801   CHOLHDL 3  05/18/2018 1801   VLDL 21.2 05/18/2018 1801   LDLCALC 75 05/18/2018 1801   LDLDIRECT 140.5 08/12/2013 0913   Hepatic Function Panel     Component Value Date/Time   PROT 7.6 05/18/2018 1801   ALBUMIN 4.4 05/18/2018 1801   AST 16 05/18/2018 1801   ALT 16 05/18/2018 1801   ALKPHOS 64 05/18/2018 1801   BILITOT 0.4 05/18/2018 1801   BILIDIR 0.0 02/22/2015 1206      Component Value Date/Time   TSH 1.760 08/03/2018 1324   TSH 1.81 09/15/2017 1153   TSH 1.16 12/15/2016 0904    Ref. Range 08/03/2018 13:24  Vitamin D, 25-Hydroxy Latest Ref Range: 30.0 - 100.0 ng/mL 33.3   ASSESSMENT AND PLAN: Vitamin D deficiency - Plan: Vitamin D, Ergocalciferol, (DRISDOL) 1.25 MG (50000 UT) CAPS capsule  Essential hypertension  At risk for osteoporosis  Class 1 obesity with serious comorbidity and body mass index (BMI) of 32.0 to 32.9 in adult, unspecified obesity type  PLAN:  Vitamin D Deficiency Abbygael was informed that low vitamin D levels contributes to fatigue and are associated with obesity, breast, and colon cancer. She agrees to continue to take prescription Vit D @50 ,000 IU every week #4 with no refills and will follow up for routine testing of vitamin D, at least 2-3 times per year. She was informed of the risk of over-replacement of vitamin D and agrees to not increase her dose unless she discusses this with [name] first. Shauna agrees to follow up as directed.  At risk for osteopenia and osteoporosis Sherline was given extended  (15 minutes) osteoporosis prevention counseling today. Blythe is at risk for osteopenia and osteoporosis due to her vitamin D deficiency. She was encouraged to take her  vitamin D and follow her higher calcium diet and increase strengthening exercise to help strengthen her bones and decrease her risk of osteopenia and osteoporosis.  Hypertension We discussed sodium restriction, working on healthy weight loss, and a regular exercise program as the means to achieve improved blood pressure control. Janielle agreed with this plan and agreed to follow up as directed. We will continue to monitor her blood pressure as well as her progress with the above lifestyle modifications. She will continue her medications as prescribed and will watch for signs of hypotension as she continues her lifestyle modifications.  Obesity Emani is currently in the action stage of change. As such, her goal is to continue with weight loss efforts She has agreed to follow the Category 2 plan Chameka has been instructed to work up to a goal of 150 minutes of combined cardio and strengthening exercise per week for weight loss and overall health benefits. We discussed the following Behavioral Modification Strategies today: increasing lean protein intake, decreasing simple carbohydrates , work on meal planning and easy cooking plans and holiday eating strategies   Tarren has agreed to follow up with our clinic in 2 weeks. She was informed of the importance of frequent follow up visits to maximize her success with intensive lifestyle modifications for her multiple health conditions.   OBESITY BEHAVIORAL INTERVENTION VISIT  Today's visit was # 5   Starting weight: 212 lbs Starting date: 08/03/2018 Today's weight : 198 lbs  Today's date: 10/05/2018 Total lbs lost to date: 14   ASK: We discussed the diagnosis of obesity with Marton Redwood today and Dannelle agreed to give [name] permission to discuss obesity behavioral modification therapy today.  ASSESS: Sumayyah has the diagnosis of obesity and her BMI today is 31.97  Narya is in the action stage of change   ADVISE: Yanelli was educated on the multiple health  risks of obesity as well as the benefit of weight loss to improve her health. She was advised of the need for long term treatment and the importance of lifestyle modifications to improve her current health and to decrease her risk of future health problems.  AGREE: Multiple dietary modification options and treatment options were discussed and  Alexsandria agreed to follow the recommendations documented in the above note.  ARRANGE: Rayvn was educated on the importance of frequent visits to treat obesity as outlined per CMS and USPSTF guidelines and agreed to schedule her next follow up appointment today.  I, Doreene Nest, am acting as transcriptionist for Eber Jones, MD  I have reviewed the above documentation for accuracy and completeness, and I agree with the above. - Ilene Qua, MD

## 2018-10-19 ENCOUNTER — Encounter (INDEPENDENT_AMBULATORY_CARE_PROVIDER_SITE_OTHER): Payer: Self-pay | Admitting: Family Medicine

## 2018-10-19 ENCOUNTER — Ambulatory Visit (INDEPENDENT_AMBULATORY_CARE_PROVIDER_SITE_OTHER): Admitting: Family Medicine

## 2018-10-19 VITALS — BP 115/71 | HR 51 | Temp 97.9°F | Ht 66.0 in | Wt 197.0 lb

## 2018-10-19 DIAGNOSIS — E669 Obesity, unspecified: Secondary | ICD-10-CM | POA: Diagnosis not present

## 2018-10-19 DIAGNOSIS — R7303 Prediabetes: Secondary | ICD-10-CM

## 2018-10-19 DIAGNOSIS — Z6831 Body mass index (BMI) 31.0-31.9, adult: Secondary | ICD-10-CM

## 2018-10-19 NOTE — Progress Notes (Signed)
Office: [phone]  /  Fax: [phone]   HPI:   Chief Complaint: OBESITY Alicia Knapp is here to discuss her progress with her obesity treatment plan. She is on the Category 2 plan and is following her eating plan approximately 90% of the time. She states she is bowling and doing yoga for 60-90 minutes 1-2 times per week. Alicia Knapp loves the food on the plan. She has been going to some holiday parties.  Her weight is 197 lb (89.4 kg) today and has had a weight loss of 1 pound over a period of 2 weeks since her last visit. She has lost 15 lbs since starting treatment with [name].  Pre-Diabetes Alicia Knapp has a diagnosis of pre-diabetes based on her elevated Hgb A1c and was informed this puts her at greater risk of developing diabetes. She is not on metformin currently and continues to work on diet and exercise to decrease risk of diabetes. She denies polyphagia or hypoglycemia. Lab Results  Component Value Date   HGBA1C 5.7 05/18/2018    ALLERGIES: Allergies  Allergen Reactions  . Erythromycin     Causes stomach cramps  . Penicillins     rash    MEDICATIONS: Current Outpatient Medications on File Prior to Visit  Medication Sig Dispense Refill  . amLODipine (NORVASC) 10 MG tablet TAKE 1 TABLET DAILY 90 tablet 4  . ARIPiprazole (ABILIFY) 2 MG tablet Take 2 mg by mouth daily.     . ARIPiprazole (ABILIFY) 5 MG tablet Take 1 tablet by mouth daily.  0  . atorvastatin (LIPITOR) 10 MG tablet TAKE 1 TABLET DAILY 90 tablet 3  . carvedilol (COREG) 6.25 MG tablet Take 1 tablet (6.25 mg total) by mouth 2 (two) times daily with a meal. 180 tablet 1  . cholecalciferol (VITAMIN D) 1000 UNITS tablet Take 1,000 Units by mouth daily.    . Cyanocobalamin (VITAMIN B 12) 100 MCG LOZG Take by mouth.    . fluticasone (FLONASE) 50 MCG/ACT nasal spray Place 2 sprays into both nostrils daily. 48 g 2  . hydrochlorothiazide (HYDRODIURIL) 25 MG tablet Take 1 tablet (25 mg total) by mouth daily. 90 tablet 1  . levocetirizine  (XYZAL) 5 MG tablet Take 1 tablet (5 mg total) by mouth every evening. 90 tablet 3  . LORazepam (ATIVAN) 1 MG tablet Take 0.5 mg by mouth every 8 (eight) hours.     [name] losartan (COZAAR) 50 MG tablet Take 1 tablet (50 mg total) by mouth daily. 90 tablet 1  . Vitamin D, Ergocalciferol, (DRISDOL) 1.25 MG (50000 UT) CAPS capsule Take 1 capsule (50,000 Units total) by mouth every 7 (seven) days. 4 capsule 0   No current facility-administered medications on file prior to visit.     PAST MEDICAL HISTORY: Past Medical History:  Diagnosis Date  . Anxiety   . B12 deficiency   . Constipation   . Heart murmur   . Hyperlipidemia   . Hypertension   . Joint pain   . Postoperative nausea   . Prediabetes   . Swelling    lower ext  . Vitamin D deficiency     PAST SURGICAL HISTORY: Past Surgical History:  Procedure Laterality Date  . ABDOMINAL HYSTERECTOMY    . APPENDECTOMY    . COLONOSCOPY  05/22/2015   Dr.Pyrtle  . POLYPECTOMY      SOCIAL HISTORY: Social History   Tobacco Use  . Smoking status: Former Smoker    Types: Cigarettes    Last attempt to quit: 09/17/2014  Years since quitting: 4.0  . Smokeless tobacco: Never Used  Substance Use Topics  . Alcohol use: No    Alcohol/week: 0.0 standard drinks  . Drug use: No    FAMILY HISTORY: Family History  Problem Relation Age of Onset  . Cancer Father        prostate  . Prostate cancer Father   . Diabetes Mother   . Thyroid disease Mother   . Hypertension Mother   . Hyperlipidemia Mother   . Kidney disease Mother   . Cancer Mother   . Sleep apnea Mother   . Obesity Mother   . Hypertension Sister   . Hypertension Brother   . Stroke Brother   . Colon cancer Brother 45       died 75 - 4-5 months after dx  . Arthritis Maternal Grandmother   . Esophageal cancer Neg Hx   . Rectal cancer Neg Hx   . Stomach cancer Neg Hx     ROS: Review of Systems  Constitutional: Positive for weight loss.  Endo/Heme/Allergies:        Negative polyphagia Negative hypoglycemia    PHYSICAL EXAM: Blood pressure 115/71, pulse (!) 51, temperature 97.9 F (36.6 C), temperature source Oral, height 5\' 6"  (1.676 m), weight 197 lb (89.4 kg), SpO2 100 %. Body mass index is 31.8 kg/m. Physical Exam Vitals signs reviewed.  Constitutional:      Appearance: Normal appearance. She is obese.  Cardiovascular:     Rate and Rhythm: Normal rate.  Pulmonary:     Effort: Pulmonary effort is normal.  Musculoskeletal: Normal range of motion.  Skin:    General: Skin is warm and dry.  Neurological:     Mental Status: She is alert and oriented to person, place, and time.  Psychiatric:        Mood and Affect: Mood normal.        Behavior: Behavior normal.     RECENT LABS AND TESTS: BMET    Component Value Date/Time   NA 140 05/18/2018 1801   K 4.3 05/18/2018 1801   CL 102 05/18/2018 1801   CO2 31 05/18/2018 1801   GLUCOSE 98 05/18/2018 1801   BUN 11 05/18/2018 1801   CREATININE 0.92 05/18/2018 1801   CALCIUM 10.0 05/18/2018 1801   GFRNONAA 134.84 04/16/2009 1252   GFRAA 85 02/21/2008 1415   Lab Results  Component Value Date   HGBA1C 5.7 05/18/2018   HGBA1C 5.6 12/21/2017   Lab Results  Component Value Date   INSULIN 15.4 08/03/2018   CBC    Component Value Date/Time   WBC 3.6 (L) 09/15/2017 1153   RBC 4.43 09/15/2017 1153   HGB 12.7 09/15/2017 1153   HCT 39.3 09/15/2017 1153   PLT 349.0 09/15/2017 1153   MCV 88.7 09/15/2017 1153   MCHC 32.3 09/15/2017 1153   RDW 14.3 09/15/2017 1153   LYMPHSABS 1.6 09/15/2017 1153   MONOABS 0.3 09/15/2017 1153   EOSABS 0.1 09/15/2017 1153   BASOSABS 0.0 09/15/2017 1153   Iron/TIBC/Ferritin/ %Sat No results found for: IRON, TIBC, FERRITIN, IRONPCTSAT Lipid Panel     Component Value Date/Time   CHOL 142 05/18/2018 1801   TRIG 106.0 05/18/2018 1801   HDL 45.50 05/18/2018 1801   CHOLHDL 3 05/18/2018 1801   VLDL 21.2 05/18/2018 1801   LDLCALC 75 05/18/2018 1801    LDLDIRECT 140.5 08/12/2013 0913   Hepatic Function Panel     Component Value Date/Time   PROT 7.6 05/18/2018 1801  ALBUMIN 4.4 05/18/2018 1801   AST 16 05/18/2018 1801   ALT 16 05/18/2018 1801   ALKPHOS 64 05/18/2018 1801   BILITOT 0.4 05/18/2018 1801   BILIDIR 0.0 02/22/2015 1206      Component Value Date/Time   TSH 1.760 08/03/2018 1324   TSH 1.81 09/15/2017 1153   TSH 1.16 12/15/2016 0904    ASSESSMENT AND PLAN: Prediabetes  Class 1 obesity with serious comorbidity and body mass index (BMI) of 31.0 to 31.9 in adult, unspecified obesity type  PLAN:  Pre-Diabetes Akiva will continue meal plan, and she will continue to work on weight loss, exercise, and decreasing simple carbohydrates in her diet to help decrease the risk of diabetes. We dicussed metformin including benefits and risks. Alicia Knapp declined metformin for now and a prescription was not written today. We will recheck A1c at next visit. Alicia Knapp agrees to follow up with our clinic in 2 to 3 weeks as directed to monitor her progress.  I spent > than 50% of the 15 minute visit on counseling as documented in the note.  Obesity Alicia Knapp is currently in the action stage of change. As such, her goal is to continue with weight loss efforts She has agreed to follow the Category 2 plan Alicia Knapp will continue current exercise regimen for weight loss and overall health benefits. We discussed the following Behavioral Modification Strategies today: work on meal planning and easy cooking plans, keeping healthy foods in the home, and planning for success   Alicia Knapp has agreed to follow up with our clinic in 2 to 3 weeks. She was informed of the importance of frequent follow up visits to maximize her success with intensive lifestyle modifications for her multiple health conditions.   OBESITY BEHAVIORAL INTERVENTION VISIT  Today's visit was # 6  Starting weight: 212 lbs Starting date: 08/03/18 Today's weight : 197 lbs  Today's date:  10/19/2018 Total lbs lost to date: 15    ASK: We discussed the diagnosis of obesity with Alicia Knapp today and Alicia Knapp agreed to give [name] permission to discuss obesity behavioral modification therapy today.  ASSESS: Alicia Knapp has the diagnosis of obesity and her BMI today is 31.81 Alicia Knapp is in the action stage of change   ADVISE: Alicia Knapp was educated on the multiple health risks of obesity as well as the benefit of weight loss to improve her health. She was advised of the need for long term treatment and the importance of lifestyle modifications to improve her current health and to decrease her risk of future health problems.  AGREE: Multiple dietary modification options and treatment options were discussed and  Alicia Knapp agreed to follow the recommendations documented in the above note.  ARRANGE: Alicia Knapp was educated on the importance of frequent visits to treat obesity as outlined per CMS and USPSTF guidelines and agreed to schedule her next follow up appointment today.  Alicia Knapp, am acting as Location manager for [employer], FNP-C.  I have reviewed the above documentation for accuracy and completeness, and I agree with the above.  -  , FNP-C.

## 2018-10-20 ENCOUNTER — Encounter (INDEPENDENT_AMBULATORY_CARE_PROVIDER_SITE_OTHER): Payer: Self-pay | Admitting: Family Medicine

## 2018-11-09 ENCOUNTER — Ambulatory Visit (INDEPENDENT_AMBULATORY_CARE_PROVIDER_SITE_OTHER): Admitting: Family Medicine

## 2018-11-09 ENCOUNTER — Encounter (INDEPENDENT_AMBULATORY_CARE_PROVIDER_SITE_OTHER): Payer: Self-pay | Admitting: Family Medicine

## 2018-11-09 VITALS — BP 134/75 | HR 55 | Temp 97.9°F | Ht 66.0 in | Wt 195.0 lb

## 2018-11-09 DIAGNOSIS — E559 Vitamin D deficiency, unspecified: Secondary | ICD-10-CM

## 2018-11-09 DIAGNOSIS — R7303 Prediabetes: Secondary | ICD-10-CM | POA: Diagnosis not present

## 2018-11-09 DIAGNOSIS — E538 Deficiency of other specified B group vitamins: Secondary | ICD-10-CM

## 2018-11-09 DIAGNOSIS — Z9189 Other specified personal risk factors, not elsewhere classified: Secondary | ICD-10-CM | POA: Diagnosis not present

## 2018-11-09 DIAGNOSIS — E669 Obesity, unspecified: Secondary | ICD-10-CM

## 2018-11-09 DIAGNOSIS — Z6831 Body mass index (BMI) 31.0-31.9, adult: Secondary | ICD-10-CM

## 2018-11-09 MED ORDER — VITAMIN D (ERGOCALCIFEROL) 1.25 MG (50000 UNIT) PO CAPS: 50000.0000 [IU] | ORAL_CAPSULE | ORAL | 0 refills | Status: DC

## 2018-11-09 NOTE — Progress Notes (Signed)
Office: [phone]  /  Fax: [phone]   HPI:   Chief Complaint: OBESITY Alicia Knapp is here to discuss her progress with her obesity treatment plan. She is on the Category 2 plan and is following her eating plan approximately 85 % of the time. She states she is bowling, yoga, and cycling 45 minutes 3 to 4 times per week. Alicia Knapp has been slightly off the plan, but still lost weight.  Her weight is 195 lb (88.5 kg) today and has had a weight loss of 2 pounds over a period of 3 weeks since her last visit. She has lost 17 lbs since starting treatment with [name].  Vitamin D deficiency Alicia Knapp has a diagnosis of vitamin D deficiency. She is currently taking prescription vit D and is not at goal. Her last vitamin D level was 33.3 on 08/03/18. She denies nausea, vomiting, or muscle weakness.  Pre-Diabetes Alicia Knapp has a diagnosis of pre-diabetes based on her elevated Hgb A1c and was informed this puts her at greater risk of developing diabetes. She is not taking metformin currently and continues to work on diet and exercise to decrease risk of diabetes. She denies polyphagia or hypoglycemia. Lab Results  Component Value Date   HGBA1C 5.5 11/09/2018    At risk for diabetes Alicia Knapp is at higher than average risk for developing diabetes due to her pre-diabetes and obesity. She currently denies polyuria or polydipsia.  History of Vitamin B12 Deficiency Alicia Knapp has a diagnosis of B12 insufficiency. This is not a new diagnosis. [name] is not a vegetarian and does not have a previous diagnosis of pernicious anemia. She does not have a history of weight loss surgery.   ASSESSMENT AND PLAN:  Vitamin D deficiency - Plan: VITAMIN D 25 Hydroxy (Vit-D Deficiency, Fractures), Vitamin D, Ergocalciferol, (DRISDOL) 1.25 MG (50000 UT) CAPS capsule  Prediabetes - Plan: Comprehensive metabolic panel, Hemoglobin A1c, Insulin, random  B12 nutritional deficiency - Plan: Vitamin B12  At risk for diabetes mellitus  Class 1 obesity  with serious comorbidity and body mass index (BMI) of 31.0 to 31.9 in adult, unspecified obesity type  PLAN:  Vitamin D Deficiency [name] was informed that low vitamin D levels contributes to fatigue and are associated with obesity, breast, and colon cancer. She agrees to continue to take prescription Vit D @50 ,000 IU every week #4 with no refills and will follow up for routine testing of vitamin D, at least 2-3 times per year. She was informed of the risk of over-replacement of vitamin D and agrees to not increase her dose unless she discusses this with [name] first. We will order a vitamin D level today and [name] agrees to follow up in 2 weeks.  Pre-Diabetes Alicia Knapp will continue to work on weight loss, exercise, and decreasing simple carbohydrates in her diet to help decrease the risk of diabetes.  We will check her A1c, fasting Insulin and glucose today. She will continue with her meal plan and Amour agreed to follow up with [name] as directed to monitor her progress.  Diabetes risk counseling Alicia Knapp was given extended (15 minutes) diabetes prevention counseling today. She is 63 y.o. female and has risk factors for diabetes including pre-diabetes and obesity. We discussed intensive lifestyle modifications today with an emphasis on weight loss as well as increasing exercise and decreasing simple carbohydrates in her diet.  History of Vitamin B12 Deficiency Alicia Knapp will work on increasing B12 rich foods in her diet. B12 supplementation was not prescribed today. She has not taken B12  supplementation for a few months. We will plan on rechecking labs today. Alicia Knapp agrees to follow up in 2 weeks.  Obesity Alicia Knapp is currently in the action stage of change. As such, her goal is to continue with weight loss efforts. She has agreed to follow the Category 2 plan + 100 calories. Alicia Knapp has been instructed to continue bowling, yoga, and cycling 3 to 4 times per week. We discussed the following Behavioral Modification Strategies  today: planning for success.  Alicia Knapp has agreed to follow up with our clinic in 2 weeks. She was informed of the importance of frequent follow up visits to maximize her success with intensive lifestyle modifications for her multiple health conditions.  ALLERGIES: Allergies  Allergen Reactions  . Erythromycin     Causes stomach cramps  . Penicillins     rash    MEDICATIONS: Current Outpatient Medications on File Prior to Visit  Medication Sig Dispense Refill  . amLODipine (NORVASC) 10 MG tablet TAKE 1 TABLET DAILY 90 tablet 4  . ARIPiprazole (ABILIFY) 2 MG tablet Take 2 mg by mouth daily.     . ARIPiprazole (ABILIFY) 5 MG tablet Take 1 tablet by mouth daily.  0  . atorvastatin (LIPITOR) 10 MG tablet TAKE 1 TABLET DAILY 90 tablet 3  . carvedilol (COREG) 6.25 MG tablet Take 1 tablet (6.25 mg total) by mouth 2 (two) times daily with a meal. 180 tablet 1  . cholecalciferol (VITAMIN D) 1000 UNITS tablet Take 1,000 Units by mouth daily.    . Cyanocobalamin (VITAMIN B 12) 100 MCG LOZG Take by mouth.    . fluticasone (FLONASE) 50 MCG/ACT nasal spray Place 2 sprays into both nostrils daily. 48 g 2  . hydrochlorothiazide (HYDRODIURIL) 25 MG tablet Take 1 tablet (25 mg total) by mouth daily. 90 tablet 1  . levocetirizine (XYZAL) 5 MG tablet Take 1 tablet (5 mg total) by mouth every evening. 90 tablet 3  . LORazepam (ATIVAN) 1 MG tablet Take 0.5 mg by mouth every 8 (eight) hours.     [name] losartan (COZAAR) 50 MG tablet Take 1 tablet (50 mg total) by mouth daily. 90 tablet 1   No current facility-administered medications on file prior to visit.     PAST MEDICAL HISTORY: Past Medical History:  Diagnosis Date  . Anxiety   . B12 deficiency   . Constipation   . Heart murmur   . Hyperlipidemia   . Hypertension   . Joint pain   . Postoperative nausea   . Prediabetes   . Swelling    lower ext  . Vitamin D deficiency     PAST SURGICAL HISTORY: Past Surgical History:  Procedure Laterality Date   . ABDOMINAL HYSTERECTOMY    . APPENDECTOMY    . COLONOSCOPY  05/22/2015   Dr.Pyrtle  . POLYPECTOMY      SOCIAL HISTORY: Social History   Tobacco Use  . Smoking status: Former Smoker    Types: Cigarettes    Last attempt to quit: 09/17/2014    Years since quitting: 4.1  . Smokeless tobacco: Never Used  Substance Use Topics  . Alcohol use: No    Alcohol/week: 0.0 standard drinks  . Drug use: No    FAMILY HISTORY: Family History  Problem Relation Age of Onset  . Cancer Father        prostate  . Prostate cancer Father   . Diabetes Mother   . Thyroid disease Mother   . Hypertension Mother   . Hyperlipidemia Mother   .  Kidney disease Mother   . Cancer Mother   . Sleep apnea Mother   . Obesity Mother   . Hypertension Sister   . Hypertension Brother   . Stroke Brother   . Colon cancer Brother 70       died 61 - 4-5 months after dx  . Arthritis Maternal Grandmother   . Esophageal cancer Neg Hx   . Rectal cancer Neg Hx   . Stomach cancer Neg Hx     ROS: Review of Systems  Constitutional: Positive for weight loss.  Gastrointestinal: Negative for nausea and vomiting.  Genitourinary:       Negative for polyuria.  Musculoskeletal:       Negative for muscle weakness.  Endo/Heme/Allergies: Negative for polydipsia.       Negative for polyphagia. Negative for hypoglycemia.    PHYSICAL EXAM: Blood pressure 134/75, pulse (!) 55, temperature 97.9 F (36.6 C), temperature source Oral, height 5\' 6"  (1.676 m), weight 195 lb (88.5 kg), SpO2 98 %. Body mass index is 31.47 kg/m. Physical Exam Vitals signs reviewed.  Constitutional:      Appearance: Normal appearance. She is obese.  Cardiovascular:     Rate and Rhythm: Normal rate.  Pulmonary:     Effort: Pulmonary effort is normal.  Musculoskeletal: Normal range of motion.  Skin:    General: Skin is warm and dry.  Neurological:     Mental Status: She is alert and oriented to person, place, and time.  Psychiatric:         Mood and Affect: Mood normal.        Behavior: Behavior normal.     RECENT LABS AND TESTS: BMET    Component Value Date/Time   NA 138 11/09/2018 1033   K 4.4 11/09/2018 1033   CL 99 11/09/2018 1033   CO2 23 11/09/2018 1033   GLUCOSE 97 11/09/2018 1033   GLUCOSE 98 05/18/2018 1801   BUN 15 11/09/2018 1033   CREATININE 0.84 11/09/2018 1033   CALCIUM 9.8 11/09/2018 1033   GFRNONAA 76 11/09/2018 1033   GFRAA 87 11/09/2018 1033   Lab Results  Component Value Date   HGBA1C 5.5 11/09/2018   HGBA1C 5.7 05/18/2018   HGBA1C 5.6 12/21/2017   Lab Results  Component Value Date   INSULIN WILL FOLLOW 11/09/2018   INSULIN 15.4 08/03/2018   CBC    Component Value Date/Time   WBC 3.6 (L) 09/15/2017 1153   RBC 4.43 09/15/2017 1153   HGB 12.7 09/15/2017 1153   HCT 39.3 09/15/2017 1153   PLT 349.0 09/15/2017 1153   MCV 88.7 09/15/2017 1153   MCHC 32.3 09/15/2017 1153   RDW 14.3 09/15/2017 1153   LYMPHSABS 1.6 09/15/2017 1153   MONOABS 0.3 09/15/2017 1153   EOSABS 0.1 09/15/2017 1153   BASOSABS 0.0 09/15/2017 1153   Iron/TIBC/Ferritin/ %Sat No results found for: IRON, TIBC, FERRITIN, IRONPCTSAT Lipid Panel     Component Value Date/Time   CHOL 142 05/18/2018 1801   TRIG 106.0 05/18/2018 1801   HDL 45.50 05/18/2018 1801   CHOLHDL 3 05/18/2018 1801   VLDL 21.2 05/18/2018 1801   LDLCALC 75 05/18/2018 1801   LDLDIRECT 140.5 08/12/2013 0913   Hepatic Function Panel     Component Value Date/Time   PROT 7.6 11/09/2018 1033   ALBUMIN 4.5 11/09/2018 1033   AST 44 (H) 11/09/2018 1033   ALT 78 (H) 11/09/2018 1033   ALKPHOS 88 11/09/2018 1033   BILITOT 0.5 11/09/2018 1033   BILIDIR  0.0 02/22/2015 1206      Component Value Date/Time   TSH 1.760 08/03/2018 1324   TSH 1.81 09/15/2017 1153   TSH 1.16 12/15/2016 0904   Results for CATRINA, FELLENZ (MRN [id number]) as of 11/10/2018 06:04  Ref. Range 08/03/2018 13:24  Vitamin D, 25-Hydroxy Latest Ref Range: 30.0 - 100.0 ng/mL 33.3      OBESITY BEHAVIORAL INTERVENTION VISIT  Today's visit was # 7   Starting weight: 212 lbs Starting date: 08/03/18 Today's weight : Weight: 195 lb (88.5 kg)  Today's date: 11/09/2018 Total lbs lost to date: 17  ASK: We discussed the diagnosis of obesity with Marton Redwood today and Obelia agreed to give [name] permission to discuss obesity behavioral modification therapy today.  ASSESS: Tyrea has the diagnosis of obesity and her BMI today is 31.4. Maddalyn is in the action stage of change.   ADVISE: Chesni was educated on the multiple health risks of obesity as well as the benefit of weight loss to improve her health. She was advised of the need for long term treatment and the importance of lifestyle modifications to improve her current health and to decrease her risk of future health problems.  AGREE: Multiple dietary modification options and treatment options were discussed and Ercel agreed to follow the recommendations documented in the above note.  ARRANGE: Tela was educated on the importance of frequent visits to treat obesity as outlined per CMS and USPSTF guidelines and agreed to schedule her next follow up appointment today.  I, Marcille Blanco, am acting as Location manager for [employer], FNP-C.  I have reviewed the above documentation for accuracy and completeness, and I agree with the above.  -  , FNP-C.

## 2018-11-10 ENCOUNTER — Encounter (INDEPENDENT_AMBULATORY_CARE_PROVIDER_SITE_OTHER): Payer: Self-pay | Admitting: Family Medicine

## 2018-11-10 DIAGNOSIS — E559 Vitamin D deficiency, unspecified: Secondary | ICD-10-CM | POA: Insufficient documentation

## 2018-11-10 DIAGNOSIS — R7303 Prediabetes: Secondary | ICD-10-CM | POA: Insufficient documentation

## 2018-11-10 DIAGNOSIS — E538 Deficiency of other specified B group vitamins: Secondary | ICD-10-CM | POA: Insufficient documentation

## 2018-11-10 LAB — COMPREHENSIVE METABOLIC PANEL
ALT: 78 IU/L — ABNORMAL HIGH (ref 0–32)
AST: 44 IU/L — ABNORMAL HIGH (ref 0–40)
Albumin/Globulin Ratio: 1.5 (ref 1.2–2.2)
Albumin: 4.5 g/dL (ref 3.6–4.8)
Alkaline Phosphatase: 88 IU/L (ref 39–117)
BUN/Creatinine Ratio: 18 (ref 12–28)
BUN: 15 mg/dL (ref 8–27)
Bilirubin Total: 0.5 mg/dL (ref 0.0–1.2)
CO2: 23 mmol/L (ref 20–29)
Calcium: 9.8 mg/dL (ref 8.7–10.3)
Chloride: 99 mmol/L (ref 96–106)
Creatinine, Ser: 0.84 mg/dL (ref 0.57–1.00)
GFR calc Af Amer: 87 mL/min/{1.73_m2} (ref >59)
GFR calc non Af Amer: 76 mL/min/{1.73_m2} (ref >59)
Globulin, Total: 3.1 g/dL (ref 1.5–4.5)
Glucose: 97 mg/dL (ref 65–99)
Potassium: 4.4 mmol/L (ref 3.5–5.2)
Sodium: 138 mmol/L (ref 134–144)
Total Protein: 7.6 g/dL (ref 6.0–8.5)

## 2018-11-10 LAB — INSULIN, RANDOM: INSULIN: 13.8 u[IU]/mL (ref 2.6–24.9)

## 2018-11-10 LAB — HEMOGLOBIN A1C
Est. average glucose Bld gHb Est-mCnc: 111 mg/dL
Hgb A1c MFr Bld: 5.5 % (ref 4.8–5.6)

## 2018-11-10 LAB — VITAMIN D 25 HYDROXY (VIT D DEFICIENCY, FRACTURES): Vit D, 25-Hydroxy: 64.5 ng/mL (ref 30.0–100.0)

## 2018-11-10 LAB — VITAMIN B12: Vitamin B-12: 813 pg/mL (ref 232–1245)

## 2018-11-14 ENCOUNTER — Other Ambulatory Visit: Payer: Self-pay | Admitting: Family Medicine

## 2018-11-14 DIAGNOSIS — I10 Essential (primary) hypertension: Secondary | ICD-10-CM

## 2018-11-23 ENCOUNTER — Ambulatory Visit (INDEPENDENT_AMBULATORY_CARE_PROVIDER_SITE_OTHER): Admitting: Family Medicine

## 2018-11-23 ENCOUNTER — Encounter (INDEPENDENT_AMBULATORY_CARE_PROVIDER_SITE_OTHER): Payer: Self-pay | Admitting: Family Medicine

## 2018-11-23 VITALS — BP 119/69 | HR 54 | Temp 98.2°F | Ht 66.0 in | Wt 193.0 lb

## 2018-11-23 DIAGNOSIS — Z6831 Body mass index (BMI) 31.0-31.9, adult: Secondary | ICD-10-CM

## 2018-11-23 DIAGNOSIS — E559 Vitamin D deficiency, unspecified: Secondary | ICD-10-CM | POA: Diagnosis not present

## 2018-11-23 DIAGNOSIS — E8881 Metabolic syndrome: Secondary | ICD-10-CM

## 2018-11-23 DIAGNOSIS — E669 Obesity, unspecified: Secondary | ICD-10-CM | POA: Diagnosis not present

## 2018-11-23 DIAGNOSIS — Z9189 Other specified personal risk factors, not elsewhere classified: Secondary | ICD-10-CM

## 2018-11-23 DIAGNOSIS — E88819 Insulin resistance, unspecified: Secondary | ICD-10-CM

## 2018-11-23 NOTE — Progress Notes (Signed)
Office: [phone]  /  Fax: [phone]   HPI:   Chief Complaint: OBESITY Alicia Knapp is here to discuss her progress with her obesity treatment plan. She is on the Category 2 plan and is following her eating plan approximately 90 % of the time. She states she is cycling, yoga, and bowling 50 to 90 minutes 5 times per week. Alicia Knapp was interested in a goal weight and we decided on 170 lbs jointly.  Her weight is 193 lb (87.5 kg) today and has had a weight loss of 2 pounds over a period of 2 weeks since her last visit. She has lost 19 lbs since starting treatment with [name].  Vitamin D deficiency Alicia Knapp has a diagnosis of vitamin D deficiency. She is currently taking vit D and is at goal (>50). She denies nausea, vomiting, or muscle weakness.  Insulin Resistance Alicia Knapp has a diagnosis of insulin resistance based on her elevated fasting insulin level >5. Although Alicia Knapp's blood glucose readings are still under good control, insulin resistance puts her at greater risk of metabolic syndrome and diabetes. Her A1c decreased from 5.7 to 5.5 on 11/09/18. She is not taking metformin currently and continues to work on diet and exercise to decrease risk of diabetes. She denies polyphagia.  At risk for diabetes Alicia Knapp is at higher than average risk for developing diabetes due to her insulin resistance and obesity. She currently denies polyuria or polydipsia.  ASSESSMENT AND PLAN:  Vitamin D deficiency  Insulin resistance  At risk for diabetes mellitus  Class 1 obesity with serious comorbidity and body mass index (BMI) of 31.0 to 31.9 in adult, unspecified obesity type  PLAN:  Vitamin D Deficiency Alicia Knapp was informed that low vitamin D levels contributes to fatigue and are associated with obesity, breast, and colon cancer. She agrees to discontinue prescription Vit D and to start OTC vitamin D3 2,000 IU daily and will follow up for routine testing of vitamin D, at least 2-3 times per year. She was informed of the  risk of over-replacement of vitamin D and agrees to not increase her dose unless she discusses this with [name] first. Alicia Knapp agrees to follow up in 2 weeks.  Insulin Resistance Alicia Knapp will continue to work on weight loss, exercise, and decreasing simple carbohydrates in her diet to help decrease the risk of diabetes. She was informed that eating too many simple carbohydrates or too many calories at one sitting increases the likelihood of GI side effects. Alicia Knapp agreed to continue with her meal plan and to follow up with [name] as directed to monitor her progress.  Diabetes risk counseling Alicia Knapp was given extended (15 minutes) diabetes prevention counseling today. She is 63 y.o. female and has risk factors for diabetes including insulin resistance and obesity. We discussed intensive lifestyle modifications today with an emphasis on weight loss as well as increasing exercise and decreasing simple carbohydrates in her diet.  Obesity Alicia Knapp is currently in the action stage of change. As such, her goal is to continue with weight loss efforts She has agreed to follow the Category 2 plan + 100 calories. She was given the recipes handout. Alicia Knapp has been instructed to continue cycling, yoga, and bowling. We discussed the following Behavioral Modification Strategies today: work on meal planning and easy cooking plans, keeping healthy foods in the home, and planning for success.  Alicia Knapp has agreed to follow up with our clinic in 2 weeks. She was informed of the importance of frequent follow up visits to maximize her  success with intensive lifestyle modifications for her multiple health conditions.  ALLERGIES: Allergies  Allergen Reactions  . Erythromycin     Causes stomach cramps  . Penicillins     rash    MEDICATIONS: Current Outpatient Medications on File Prior to Visit  Medication Sig Dispense Refill  . amLODipine (NORVASC) 10 MG tablet TAKE 1 TABLET DAILY 90 tablet 4  . ARIPiprazole (ABILIFY) 2 MG tablet Take 2  mg by mouth daily.     . ARIPiprazole (ABILIFY) 5 MG tablet Take 1 tablet by mouth daily.  0  . atorvastatin (LIPITOR) 10 MG tablet TAKE 1 TABLET DAILY 90 tablet 3  . carvedilol (COREG) 6.25 MG tablet TAKE 1 TABLET TWICE A DAY WITH MEALS 180 tablet 1  . cholecalciferol (VITAMIN D3) 25 MCG (1000 UT) tablet Take 2,000 Units by mouth daily.    . fluticasone (FLONASE) 50 MCG/ACT nasal spray Place 2 sprays into both nostrils daily. 48 g 2  . hydrochlorothiazide (HYDRODIURIL) 25 MG tablet Take 1 tablet (25 mg total) by mouth daily. 90 tablet 1  . levocetirizine (XYZAL) 5 MG tablet Take 1 tablet (5 mg total) by mouth every evening. 90 tablet 3  . LORazepam (ATIVAN) 1 MG tablet Take 0.5 mg by mouth every 8 (eight) hours as needed.      Alicia Knapp Kitchen losartan (COZAAR) 50 MG tablet Take 1 tablet (50 mg total) by mouth daily. 90 tablet 1   No current facility-administered medications on file prior to visit.     PAST MEDICAL HISTORY: Past Medical History:  Diagnosis Date  . Anxiety   . B12 deficiency   . Constipation   . Heart murmur   . Hyperlipidemia   . Hypertension   . Joint pain   . Postoperative nausea   . Prediabetes   . Swelling    lower ext  . Vitamin D deficiency     PAST SURGICAL HISTORY: Past Surgical History:  Procedure Laterality Date  . ABDOMINAL HYSTERECTOMY    . APPENDECTOMY    . COLONOSCOPY  05/22/2015   Dr.Pyrtle  . POLYPECTOMY      SOCIAL HISTORY: Social History   Tobacco Use  . Smoking status: Former Smoker    Types: Cigarettes    Last attempt to quit: 09/17/2014    Years since quitting: 4.1  . Smokeless tobacco: Never Used  Substance Use Topics  . Alcohol use: No    Alcohol/week: 0.0 standard drinks  . Drug use: No    FAMILY HISTORY: Family History  Problem Relation Age of Onset  . Cancer Father        prostate  . Prostate cancer Father   . Diabetes Mother   . Thyroid disease Mother   . Hypertension Mother   . Hyperlipidemia Mother   . Kidney disease  Mother   . Cancer Mother   . Sleep apnea Mother   . Obesity Mother   . Hypertension Sister   . Hypertension Brother   . Stroke Brother   . Colon cancer Brother 51       died 26 - 4-5 months after dx  . Arthritis Maternal Grandmother   . Esophageal cancer Neg Hx   . Rectal cancer Neg Hx   . Stomach cancer Neg Hx     ROS: Review of Systems  Constitutional: Positive for weight loss.  Gastrointestinal: Negative for nausea and vomiting.  Genitourinary:       Negative for polyuria.  Musculoskeletal:       Negative  for muscle weakness.  Endo/Heme/Allergies: Negative for polydipsia.       Negative for polyphagia.   PHYSICAL EXAM: Blood pressure 119/69, pulse (!) 54, temperature 98.2 F (36.8 C), temperature source Oral, height 5\' 6"  (1.676 m), weight 193 lb (87.5 kg), SpO2 99 %. Body mass index is 31.15 kg/m. Physical Exam Vitals signs reviewed.  Constitutional:      Appearance: Normal appearance. She is obese.  Cardiovascular:     Rate and Rhythm: Normal rate.  Pulmonary:     Effort: Pulmonary effort is normal.  Musculoskeletal: Normal range of motion.  Skin:    General: Skin is warm and dry.  Neurological:     Mental Status: She is alert and oriented to person, place, and time.  Psychiatric:        Mood and Affect: Mood normal.        Behavior: Behavior normal.    RECENT LABS AND TESTS: BMET    Component Value Date/Time   NA 138 11/09/2018 1033   K 4.4 11/09/2018 1033   CL 99 11/09/2018 1033   CO2 23 11/09/2018 1033   GLUCOSE 97 11/09/2018 1033   GLUCOSE 98 05/18/2018 1801   BUN 15 11/09/2018 1033   CREATININE 0.84 11/09/2018 1033   CALCIUM 9.8 11/09/2018 1033   GFRNONAA 76 11/09/2018 1033   GFRAA 87 11/09/2018 1033   Lab Results  Component Value Date   HGBA1C 5.5 11/09/2018   HGBA1C 5.7 05/18/2018   HGBA1C 5.6 12/21/2017   Lab Results  Component Value Date   INSULIN 13.8 11/09/2018   INSULIN 15.4 08/03/2018   CBC    Component Value Date/Time    WBC 3.6 (L) 09/15/2017 1153   RBC 4.43 09/15/2017 1153   HGB 12.7 09/15/2017 1153   HCT 39.3 09/15/2017 1153   PLT 349.0 09/15/2017 1153   MCV 88.7 09/15/2017 1153   MCHC 32.3 09/15/2017 1153   RDW 14.3 09/15/2017 1153   LYMPHSABS 1.6 09/15/2017 1153   MONOABS 0.3 09/15/2017 1153   EOSABS 0.1 09/15/2017 1153   BASOSABS 0.0 09/15/2017 1153   Iron/TIBC/Ferritin/ %Sat No results found for: IRON, TIBC, FERRITIN, IRONPCTSAT Lipid Panel     Component Value Date/Time   CHOL 142 05/18/2018 1801   TRIG 106.0 05/18/2018 1801   HDL 45.50 05/18/2018 1801   CHOLHDL 3 05/18/2018 1801   VLDL 21.2 05/18/2018 1801   LDLCALC 75 05/18/2018 1801   LDLDIRECT 140.5 08/12/2013 0913   Hepatic Function Panel     Component Value Date/Time   PROT 7.6 11/09/2018 1033   ALBUMIN 4.5 11/09/2018 1033   AST 44 (H) 11/09/2018 1033   ALT 78 (H) 11/09/2018 1033   ALKPHOS 88 11/09/2018 1033   BILITOT 0.5 11/09/2018 1033   BILIDIR 0.0 02/22/2015 1206      Component Value Date/Time   TSH 1.760 08/03/2018 1324   TSH 1.81 09/15/2017 1153   TSH 1.16 12/15/2016 0904   Results for BARBETTE, MCGLAUN (MRN [id number]) as of 11/23/2018 13:43  Ref. Range 11/09/2018 10:33  Vitamin D, 25-Hydroxy Latest Ref Range: 30.0 - 100.0 ng/mL 64.5   OBESITY BEHAVIORAL INTERVENTION VISIT  Today's visit was # 8   Starting weight: 212 lbs Starting date: 08/03/18 Today's weight : Weight: 193 lb (87.5 kg)  Today's date: 11/23/2018 Total lbs lost to date: 65  ASK: We discussed the diagnosis of obesity with Marton Redwood today and Monzerrath agreed to give [name] permission to discuss obesity behavioral modification therapy today.  ASSESS: Tanyia has  the diagnosis of obesity and her BMI today is 31.1. Lisett is in the action stage of change.   ADVISE: Nabilah was educated on the multiple health risks of obesity as well as the benefit of weight loss to improve her health. She was advised of the need for long term treatment and the importance of  lifestyle modifications to improve her current health and to decrease her risk of future health problems.  AGREE: Multiple dietary modification options and treatment options were discussed and Lamiah agreed to follow the recommendations documented in the above note.  ARRANGE: Weston was educated on the importance of frequent visits to treat obesity as outlined per CMS and USPSTF guidelines and agreed to schedule her next follow up appointment today.  I, Marcille Blanco, am acting as Location manager for [employer], FNP-C.  I have reviewed the above documentation for accuracy and completeness, and I agree with the above.  - Elliyah Liszewski, FNP-C.

## 2018-11-26 ENCOUNTER — Ambulatory Visit (INDEPENDENT_AMBULATORY_CARE_PROVIDER_SITE_OTHER): Admitting: Family Medicine

## 2018-11-26 ENCOUNTER — Encounter: Payer: Self-pay | Admitting: Family Medicine

## 2018-11-26 DIAGNOSIS — E669 Obesity, unspecified: Secondary | ICD-10-CM

## 2018-11-26 DIAGNOSIS — E785 Hyperlipidemia, unspecified: Secondary | ICD-10-CM

## 2018-11-26 DIAGNOSIS — R7303 Prediabetes: Secondary | ICD-10-CM | POA: Diagnosis not present

## 2018-11-26 DIAGNOSIS — I10 Essential (primary) hypertension: Secondary | ICD-10-CM

## 2018-11-26 DIAGNOSIS — Z6831 Body mass index (BMI) 31.0-31.9, adult: Secondary | ICD-10-CM

## 2018-11-26 NOTE — Assessment & Plan Note (Signed)
con't healthy weight and wellness They are checking labs q39m

## 2018-11-26 NOTE — Assessment & Plan Note (Signed)
Tolerating statin, encouraged heart healthy diet, avoid trans fats, minimize simple carbs and saturated fats. Increase exercise as tolerated 

## 2018-11-26 NOTE — Patient Instructions (Signed)
Preventive Care 40-64 Years, Female Preventive care refers to lifestyle choices and visits with your health care provider that can promote health and wellness. What does preventive care include?   A yearly physical exam. This is also called an annual well check.  Dental exams once or twice a year.  Routine eye exams. Ask your health care provider how often you should have your eyes checked.  Personal lifestyle choices, including: ? Daily care of your teeth and gums. ? Regular physical activity. ? Eating a healthy diet. ? Avoiding tobacco and drug use. ? Limiting alcohol use. ? Practicing safe sex. ? Taking low-dose aspirin daily starting at age 50. ? Taking vitamin and mineral supplements as recommended by your health care provider. What happens during an annual well check? The services and screenings done by your health care provider during your annual well check will depend on your age, overall health, lifestyle risk factors, and family history of disease. Counseling Your health care provider may ask you questions about your:  Alcohol use.  Tobacco use.  Drug use.  Emotional well-being.  Home and relationship well-being.  Sexual activity.  Eating habits.  Work and work environment.  Method of birth control.  Menstrual cycle.  Pregnancy history. Screening You may have the following tests or measurements:  Height, weight, and BMI.  Blood pressure.  Lipid and cholesterol levels. These may be checked every 5 years, or more frequently if you are over 50 years old.  Skin check.  Lung cancer screening. You may have this screening every year starting at age 55 if you have a 30-pack-year history of smoking and currently smoke or have quit within the past 15 years.  Colorectal cancer screening. All adults should have this screening starting at age 50 and continuing until age 75. Your health care provider may recommend screening at age 45. You will have tests every  1-10 years, depending on your results and the type of screening test. People at increased risk should start screening at an earlier age. Screening tests may include: ? Guaiac-based fecal occult blood testing. ? Fecal immunochemical test (FIT). ? Stool DNA test. ? Virtual colonoscopy. ? Sigmoidoscopy. During this test, a flexible tube with a tiny camera (sigmoidoscope) is used to examine your rectum and lower colon. The sigmoidoscope is inserted through your anus into your rectum and lower colon. ? Colonoscopy. During this test, a long, thin, flexible tube with a tiny camera (colonoscope) is used to examine your entire colon and rectum.  Hepatitis C blood test.  Hepatitis B blood test.  Sexually transmitted disease (STD) testing.  Diabetes screening. This is done by checking your blood sugar (glucose) after you have not eaten for a while (fasting). You may have this done every 1-3 years.  Mammogram. This may be done every 1-2 years. Talk to your health care provider about when you should start having regular mammograms. This may depend on whether you have a family history of breast cancer.  BRCA-related cancer screening. This may be done if you have a family history of breast, ovarian, tubal, or peritoneal cancers.  Pelvic exam and Pap test. This may be done every 3 years starting at age 21. Starting at age 30, this may be done every 5 years if you have a Pap test in combination with an HPV test.  Bone density scan. This is done to screen for osteoporosis. You may have this scan if you are at high risk for osteoporosis. Discuss your test results, treatment options,   and if necessary, the need for more tests with your health care provider. Vaccines Your health care provider may recommend certain vaccines, such as:  Influenza vaccine. This is recommended every year.  Tetanus, diphtheria, and acellular pertussis (Tdap, Td) vaccine. You may need a Td booster every 10 years.  Varicella  vaccine. You may need this if you have not been vaccinated.  Zoster vaccine. You may need this after age 38.  Measles, mumps, and rubella (MMR) vaccine. You may need at least one dose of MMR if you were born in 1957 or later. You may also need a second dose.  Pneumococcal 13-valent conjugate (PCV13) vaccine. You may need this if you have certain conditions and were not previously vaccinated.  Pneumococcal polysaccharide (PPSV23) vaccine. You may need one or two doses if you smoke cigarettes or if you have certain conditions.  Meningococcal vaccine. You may need this if you have certain conditions.  Hepatitis A vaccine. You may need this if you have certain conditions or if you travel or work in places where you may be exposed to hepatitis A.  Hepatitis B vaccine. You may need this if you have certain conditions or if you travel or work in places where you may be exposed to hepatitis B.  Haemophilus influenzae type b (Hib) vaccine. You may need this if you have certain conditions. Talk to your health care provider about which screenings and vaccines you need and how often you need them. This information is not intended to replace advice given to you by your health care provider. Make sure you discuss any questions you have with your health care provider. Document Released: 11/16/2015 Document Revised: 12/10/2017 Document Reviewed: 08/21/2015 Elsevier Interactive Patient Education  2019 [employer].

## 2018-11-26 NOTE — Assessment & Plan Note (Signed)
Resolved with weight loss.   

## 2018-11-26 NOTE — Assessment & Plan Note (Signed)
Well controlled, no changes to meds. Encouraged heart healthy diet such as the DASH diet and exercise as tolerated.  °

## 2018-11-26 NOTE — Progress Notes (Signed)
Subjective:     Alicia Knapp is a 63 y.o. female and is here for a comprehensive physical exam. The patient reports no problems.  Social History   Socioeconomic History  . Marital status: Married    Spouse name: [name]  . Number of children: Not on file  . Years of education: Not on file  . Highest education level: Not on file  Occupational History  . Occupation: Stay at home spouse  Social Needs  . Financial resource strain: Not on file  . Food insecurity:    Worry: Not on file    Inability: Not on file  . Transportation needs:    Medical: Not on file    Non-medical: Not on file  Tobacco Use  . Smoking status: Former Smoker    Types: Cigarettes    Last attempt to quit: 09/17/2014    Years since quitting: 4.1  . Smokeless tobacco: Never Used  Substance and Sexual Activity  . Alcohol use: No    Alcohol/week: 0.0 standard drinks  . Drug use: No  . Sexual activity: Yes    Partners: Male  Lifestyle  . Physical activity:    Days per week: Not on file    Minutes per session: Not on file  . Stress: Not on file  Relationships  . Social connections:    Talks on phone: Not on file    Gets together: Not on file    Attends religious service: Not on file    Active member of club or organization: Not on file    Attends meetings of clubs or organizations: Not on file    Relationship status: Not on file  . Intimate partner violence:    Fear of current or ex partner: Not on file    Emotionally abused: Not on file    Physically abused: Not on file    Forced sexual activity: Not on file  Other Topics Concern  . Not on file  Social History Narrative  . Not on file   Health Maintenance  Topic Date Due  . PAP SMEAR-Modifier  02/21/2018  . HIV Screening  11/27/2019 (Originally 12/20/1970)  . INFLUENZA VACCINE  02/01/2027 (Originally 06/03/2018)  . MAMMOGRAM  08/15/2019  . COLONOSCOPY  06/28/2021  . TETANUS/TDAP  04/18/2026  . Hepatitis C Screening  Completed    The  following portions of the patient's history were reviewed and updated as appropriate:  She  has a past medical history of Anxiety, B12 deficiency, Constipation, Heart murmur, Hyperlipidemia, Hypertension, Joint pain, Postoperative nausea, Prediabetes, Swelling, and Vitamin D deficiency. She does not have any pertinent problems on file. She  has a past surgical history that includes Abdominal hysterectomy; Appendectomy; Colonoscopy (05/22/2015); and Polypectomy. Her family history includes Arthritis in her maternal grandmother; Cancer in her father and mother; Colon cancer (age of onset: 73) in her brother; Diabetes in her mother; Hyperlipidemia in her mother; Hypertension in her brother, mother, and sister; Kidney disease in her mother; Obesity in her mother; Prostate cancer in her father; Sleep apnea in her mother; Stroke in her brother; Thyroid disease in her mother. She  reports that she quit smoking about 4 years ago. Her smoking use included cigarettes. She has never used smokeless tobacco. She reports that she does not drink alcohol or use drugs. She has a current medication list which includes the following prescription(s): amlodipine, aripiprazole, aripiprazole, atorvastatin, carvedilol, cholecalciferol, fluticasone, hydrochlorothiazide, levocetirizine, lorazepam, and losartan. Current Outpatient Medications on File Prior to Visit  Medication  Sig Dispense Refill  . amLODipine (NORVASC) 10 MG tablet TAKE 1 TABLET DAILY 90 tablet 4  . ARIPiprazole (ABILIFY) 2 MG tablet Take 2 mg by mouth daily.     . ARIPiprazole (ABILIFY) 5 MG tablet Take 1 tablet by mouth daily.  0  . atorvastatin (LIPITOR) 10 MG tablet TAKE 1 TABLET DAILY 90 tablet 3  . carvedilol (COREG) 6.25 MG tablet TAKE 1 TABLET TWICE A DAY WITH MEALS 180 tablet 1  . cholecalciferol (VITAMIN D3) 25 MCG (1000 UT) tablet Take 2,000 Units by mouth daily.    . fluticasone (FLONASE) 50 MCG/ACT nasal spray Place 2 sprays into both nostrils  daily. 48 g 2  . hydrochlorothiazide (HYDRODIURIL) 25 MG tablet Take 1 tablet (25 mg total) by mouth daily. 90 tablet 1  . levocetirizine (XYZAL) 5 MG tablet Take 1 tablet (5 mg total) by mouth every evening. 90 tablet 3  . LORazepam (ATIVAN) 1 MG tablet Take 0.5 mg by mouth every 8 (eight) hours as needed.      [name] losartan (COZAAR) 50 MG tablet Take 1 tablet (50 mg total) by mouth daily. 90 tablet 1   No current facility-administered medications on file prior to visit.    She is allergic to erythromycin and penicillins..  Review of Systems Review of Systems  Constitutional: Negative for activity change, appetite change and fatigue.  HENT: Negative for hearing loss, congestion, tinnitus and ear discharge.  dentist q62m Eyes: Negative for visual disturbance (see optho q1y -- vision corrected to 20/20 with glasses).  Respiratory: Negative for cough, chest tightness and shortness of breath.   Cardiovascular: Negative for chest pain, palpitations and leg swelling.  Gastrointestinal: Negative for abdominal pain, diarrhea, constipation and abdominal distention.  Genitourinary: Negative for urgency, frequency, decreased urine volume and difficulty urinating.  Musculoskeletal: Negative for back pain, arthralgias and gait problem.  Skin: Negative for color change, pallor and rash.  Neurological: Negative for dizziness, light-headedness, numbness and headaches.  Hematological: Negative for adenopathy. Does not bruise/bleed easily.  Psychiatric/Behavioral: Negative for suicidal ideas, confusion, sleep disturbance, self-injury, dysphoric mood, decreased concentration and agitation.       Objective:    BP 110/60 (BP Location: Left Arm, Cuff Size: Normal)   Pulse 66   Temp 98.2 F (36.8 C) (Oral)   Resp 16   Ht 5' 5.8" (1.671 m)   Wt 195 lb 6.4 oz (88.6 kg)   SpO2 98%   BMI 31.73 kg/m  General appearance: alert, cooperative, appears stated age and no distress Head: Normocephalic, without  obvious abnormality, atraumatic Eyes: conjunctivae/corneas clear. PERRL, EOM's intact. Fundi benign. Ears: normal TM's and external ear canals both ears Nose: Nares normal. Septum midline. Mucosa normal. No drainage or sinus tenderness. Throat: lips, mucosa, and tongue normal; teeth and gums normal Neck: no adenopathy, no carotid bruit, no JVD, supple, symmetrical, trachea midline and thyroid not enlarged, symmetric, no tenderness/mass/nodules Back: symmetric, no curvature. ROM normal. No CVA tenderness. Lungs: clear to auscultation bilaterally Breasts: normal appearance, no masses or tenderness  L breast inverted nipple-- not new  Heart: regular rate and rhythm, S1, S2 normal, no murmur, click, rub or gallop Abdomen: soft, non-tender; bowel sounds normal; no masses,  no organomegaly Pelvic: not indicated; status post hysterectomy, negative ROS Extremities: extremities normal, atraumatic, no cyanosis or edema Pulses: 2+ and symmetric Skin: Skin color, texture, turgor normal. No rashes or lesions Lymph nodes: Cervical, supraclavicular, and axillary nodes normal. Neurologic: Alert and oriented X 3, normal strength and tone.  Normal symmetric reflexes. Normal coordination and gait    Assessment:    Healthy female exam.      Plan:    ghm utd Check labs  See After Visit Summary for Counseling Recommendations    1. Class 1 obesity with serious comorbidity and body mass index (BMI) of 31.0 to 31.9 in adult, unspecified obesity type Healthy weight and wellness  2. Essential hypertension Well controlled, no changes to meds. Encouraged heart healthy diet such as the DASH diet and exercise as tolerated.   3. Hyperlipidemia LDL goal <100 Tolerating statin, encouraged heart healthy diet, avoid trans fats, minimize simple carbs and saturated fats. Increase exercise as tolerated  4. Prediabetes Resolved with weight loss

## 2018-12-02 ENCOUNTER — Other Ambulatory Visit: Payer: Self-pay | Admitting: Family Medicine

## 2018-12-02 DIAGNOSIS — I10 Essential (primary) hypertension: Secondary | ICD-10-CM

## 2018-12-07 ENCOUNTER — Encounter (INDEPENDENT_AMBULATORY_CARE_PROVIDER_SITE_OTHER): Payer: Self-pay | Admitting: Family Medicine

## 2018-12-07 ENCOUNTER — Ambulatory Visit (INDEPENDENT_AMBULATORY_CARE_PROVIDER_SITE_OTHER): Admitting: Family Medicine

## 2018-12-07 VITALS — BP 106/69 | HR 59 | Temp 98.3°F | Ht 66.0 in | Wt 190.0 lb

## 2018-12-07 DIAGNOSIS — Z6830 Body mass index (BMI) 30.0-30.9, adult: Secondary | ICD-10-CM

## 2018-12-07 DIAGNOSIS — E669 Obesity, unspecified: Secondary | ICD-10-CM | POA: Diagnosis not present

## 2018-12-07 DIAGNOSIS — Z9189 Other specified personal risk factors, not elsewhere classified: Secondary | ICD-10-CM | POA: Diagnosis not present

## 2018-12-07 DIAGNOSIS — E8881 Metabolic syndrome: Secondary | ICD-10-CM | POA: Diagnosis not present

## 2018-12-07 NOTE — Progress Notes (Signed)
Office: [phone]  /  Fax: [phone]   HPI:   Chief Complaint: OBESITY Alicia Knapp is here to discuss her progress with her obesity treatment plan. She is on the Category 2 plan + 100 calories and is following her eating plan approximately 90 % of the time. She states she is cycling, bowling, and doing yoga 50 to 90 minutes 5 times per week. Alicia Knapp eats all the food on her plan. She has an upcoming birthday and Valentine's meal and she is wondering about food strategies for this meal. .  Her weight is 190 lb (86.2 kg) today and has had a weight loss of 3 pounds over a period of 2 weeks since her last visit. She has lost 22 lbs since starting treatment with [name].  Insulin Resistance Alicia Knapp has a diagnosis of insulin resistance based on her elevated fasting insulin level >5. Although Alicia Knapp's blood glucose readings are still under good control, insulin resistance puts her at greater risk of metabolic syndrome and diabetes. She is not taking metformin currently and continues to work on diet and exercise to decrease risk of diabetes. Alicia Knapp admits polyphagia in the afternoon and she saves her snack for the afternoon.  At risk for diabetes Alicia Knapp is at higher than average risk for developing diabetes due to her insulin resistance and obesity. She currently denies polyuria or polydipsia.  ASSESSMENT AND PLAN:  Insulin resistance  At risk for diabetes mellitus  Class 1 obesity with serious comorbidity and body mass index (BMI) of 30.0 to 30.9 in adult, unspecified obesity type  PLAN:  Insulin Resistance Alicia Knapp will continue to work on weight loss, exercise, and decreasing simple carbohydrates in her diet to help decrease the risk of diabetes. Alicia Knapp agreed to continue her meal plan and to follow up with [name] as directed to monitor her progress.  Diabetes risk counseling Alicia Knapp was given extended (15 minutes) diabetes prevention counseling today. She is 63 y.o. female and has risk factors for diabetes including  insulin resistance and obesity. We discussed intensive lifestyle modifications today with an emphasis on weight loss as well as increasing exercise and decreasing simple carbohydrates in her diet.  Obesity Alicia Knapp is currently in the action stage of change. As such, her goal is to continue with weight loss efforts. She has agreed to follow the Category 2 plan + 100 calories. We discussed the following Behavioral Modification Strategies today: holiday eating strategies and planning for success. Alicia Knapp will continue current exercise regimen for weight loss and overall health benefits. Alicia Knapp has agreed to follow up with our clinic in 2 weeks. She was informed of the importance of frequent follow up visits to maximize her success with intensive lifestyle modifications for her multiple health conditions.  ALLERGIES: Allergies  Allergen Reactions  . Erythromycin     Causes stomach cramps  . Penicillins     rash    MEDICATIONS: Current Outpatient Medications on File Prior to Visit  Medication Sig Dispense Refill  . amLODipine (NORVASC) 10 MG tablet TAKE 1 TABLET DAILY 90 tablet 4  . ARIPiprazole (ABILIFY) 2 MG tablet Take 2 mg by mouth daily.     . ARIPiprazole (ABILIFY) 5 MG tablet Take 1 tablet by mouth daily.  0  . atorvastatin (LIPITOR) 10 MG tablet TAKE 1 TABLET DAILY 90 tablet 3  . carvedilol (COREG) 6.25 MG tablet TAKE 1 TABLET TWICE A DAY WITH MEALS 180 tablet 1  . cholecalciferol (VITAMIN D3) 25 MCG (1000 UT) tablet Take 2,000 Units by mouth  daily.    . fluticasone (FLONASE) 50 MCG/ACT nasal spray Place 2 sprays into both nostrils daily. 48 g 2  . hydrochlorothiazide (HYDRODIURIL) 25 MG tablet TAKE 1 TABLET DAILY 90 tablet 1  . levocetirizine (XYZAL) 5 MG tablet Take 1 tablet (5 mg total) by mouth every evening. 90 tablet 3  . LORazepam (ATIVAN) 1 MG tablet Take 0.5 mg by mouth every 8 (eight) hours as needed.      [name] losartan (COZAAR) 50 MG tablet TAKE 1 TABLET DAILY 90 tablet 1   No  current facility-administered medications on file prior to visit.     PAST MEDICAL HISTORY: Past Medical History:  Diagnosis Date  . Anxiety   . B12 deficiency   . Constipation   . Heart murmur   . Hyperlipidemia   . Hypertension   . Joint pain   . Postoperative nausea   . Prediabetes   . Swelling    lower ext  . Vitamin D deficiency     PAST SURGICAL HISTORY: Past Surgical History:  Procedure Laterality Date  . ABDOMINAL HYSTERECTOMY    . APPENDECTOMY    . COLONOSCOPY  05/22/2015   Dr.Pyrtle  . POLYPECTOMY      SOCIAL HISTORY: Social History   Tobacco Use  . Smoking status: Former Smoker    Types: Cigarettes    Last attempt to quit: 09/17/2014    Years since quitting: 4.2  . Smokeless tobacco: Never Used  Substance Use Topics  . Alcohol use: No    Alcohol/week: 0.0 standard drinks  . Drug use: No    FAMILY HISTORY: Family History  Problem Relation Age of Onset  . Cancer Father        prostate  . Prostate cancer Father   . Diabetes Mother   . Thyroid disease Mother   . Hypertension Mother   . Hyperlipidemia Mother   . Kidney disease Mother   . Cancer Mother   . Sleep apnea Mother   . Obesity Mother   . Hypertension Sister   . Hypertension Brother   . Stroke Brother   . Colon cancer Brother 42       died 42 - 4-5 months after dx  . Arthritis Maternal Grandmother   . Esophageal cancer Neg Hx   . Rectal cancer Neg Hx   . Stomach cancer Neg Hx     ROS: Review of Systems  Constitutional: Positive for weight loss.  Genitourinary:       Negative for polyuria.  Endo/Heme/Allergies: Negative for polydipsia.       Positive for polyphagia.    PHYSICAL EXAM: Blood pressure 106/69, pulse (!) 59, temperature 98.3 F (36.8 C), temperature source Oral, height 5\' 6"  (1.676 m), weight 190 lb (86.2 kg), SpO2 98 %. Body mass index is 30.67 kg/m. Physical Exam Vitals signs reviewed.  Constitutional:      Appearance: Normal appearance. She is obese.    Cardiovascular:     Rate and Rhythm: Normal rate.  Pulmonary:     Effort: Pulmonary effort is normal.  Musculoskeletal: Normal range of motion.  Skin:    General: Skin is warm and dry.  Neurological:     Mental Status: She is alert and oriented to person, place, and time.  Psychiatric:        Mood and Affect: Mood normal.        Behavior: Behavior normal.     RECENT LABS AND TESTS: BMET    Component Value Date/Time  NA 138 11/09/2018 1033   K 4.4 11/09/2018 1033   CL 99 11/09/2018 1033   CO2 23 11/09/2018 1033   GLUCOSE 97 11/09/2018 1033   GLUCOSE 98 05/18/2018 1801   BUN 15 11/09/2018 1033   CREATININE 0.84 11/09/2018 1033   CALCIUM 9.8 11/09/2018 1033   GFRNONAA 76 11/09/2018 1033   GFRAA 87 11/09/2018 1033   Lab Results  Component Value Date   HGBA1C 5.5 11/09/2018   HGBA1C 5.7 05/18/2018   HGBA1C 5.6 12/21/2017   Lab Results  Component Value Date   INSULIN 13.8 11/09/2018   INSULIN 15.4 08/03/2018   CBC    Component Value Date/Time   WBC 3.6 (L) 09/15/2017 1153   RBC 4.43 09/15/2017 1153   HGB 12.7 09/15/2017 1153   HCT 39.3 09/15/2017 1153   PLT 349.0 09/15/2017 1153   MCV 88.7 09/15/2017 1153   MCHC 32.3 09/15/2017 1153   RDW 14.3 09/15/2017 1153   LYMPHSABS 1.6 09/15/2017 1153   MONOABS 0.3 09/15/2017 1153   EOSABS 0.1 09/15/2017 1153   BASOSABS 0.0 09/15/2017 1153   Iron/TIBC/Ferritin/ %Sat No results found for: IRON, TIBC, FERRITIN, IRONPCTSAT Lipid Panel     Component Value Date/Time   CHOL 142 05/18/2018 1801   TRIG 106.0 05/18/2018 1801   HDL 45.50 05/18/2018 1801   CHOLHDL 3 05/18/2018 1801   VLDL 21.2 05/18/2018 1801   LDLCALC 75 05/18/2018 1801   LDLDIRECT 140.5 08/12/2013 0913   Hepatic Function Panel     Component Value Date/Time   PROT 7.6 11/09/2018 1033   ALBUMIN 4.5 11/09/2018 1033   AST 44 (H) 11/09/2018 1033   ALT 78 (H) 11/09/2018 1033   ALKPHOS 88 11/09/2018 1033   BILITOT 0.5 11/09/2018 1033   BILIDIR 0.0  02/22/2015 1206      Component Value Date/Time   TSH 1.760 08/03/2018 1324   TSH 1.81 09/15/2017 1153   TSH 1.16 12/15/2016 0904   Results for CARMELLIA, KREISLER (MRN [id number]) as of 12/07/2018 13:39  Ref. Range 11/09/2018 10:33  Vitamin D, 25-Hydroxy Latest Ref Range: 30.0 - 100.0 ng/mL 64.5   OBESITY BEHAVIORAL INTERVENTION VISIT  Today's visit was # 9   Starting weight: 212 lbs Starting date: 08/03/18 Today's weight : Weight: 190 lb (86.2 kg)  Today's date: 12/07/2018 Total lbs lost to date: 55  ASK: We discussed the diagnosis of obesity with Marton Redwood today and Rexanna agreed to give [name] permission to discuss obesity behavioral modification therapy today.  ASSESS: Tarrin has the diagnosis of obesity and her BMI today is 30.6. Hanh is in the action stage of change.   ADVISE: Clorene was educated on the multiple health risks of obesity as well as the benefit of weight loss to improve her health. She was advised of the need for long term treatment and the importance of lifestyle modifications to improve her current health and to decrease her risk of future health problems.  AGREE: Multiple dietary modification options and treatment options were discussed and Elverda agreed to follow the recommendations documented in the above note.  ARRANGE: Preet was educated on the importance of frequent visits to treat obesity as outlined per CMS and USPSTF guidelines and agreed to schedule her next follow up appointment today.  I, Marcille Blanco, CMA, am acting as Location manager for [employer], FNP-C.  I have reviewed the above documentation for accuracy and completeness, and I agree with the above.  -  , FNP-C.

## 2018-12-21 ENCOUNTER — Encounter (INDEPENDENT_AMBULATORY_CARE_PROVIDER_SITE_OTHER): Payer: Self-pay | Admitting: Family Medicine

## 2018-12-21 ENCOUNTER — Ambulatory Visit (INDEPENDENT_AMBULATORY_CARE_PROVIDER_SITE_OTHER): Admitting: Family Medicine

## 2018-12-21 VITALS — BP 135/76 | HR 51 | Temp 98.2°F | Ht 66.0 in | Wt 192.0 lb

## 2018-12-21 DIAGNOSIS — Z6831 Body mass index (BMI) 31.0-31.9, adult: Secondary | ICD-10-CM

## 2018-12-21 DIAGNOSIS — Z6830 Body mass index (BMI) 30.0-30.9, adult: Secondary | ICD-10-CM

## 2018-12-21 DIAGNOSIS — E669 Obesity, unspecified: Secondary | ICD-10-CM

## 2018-12-21 DIAGNOSIS — E8881 Metabolic syndrome: Secondary | ICD-10-CM | POA: Diagnosis not present

## 2018-12-21 NOTE — Progress Notes (Signed)
Office: [phone]  /  Fax: [phone]   HPI:   Chief Complaint: OBESITY Alicia Knapp is here to discuss her progress with her obesity treatment plan. She is on the Category 2 plan + 100 calories and is following her eating plan approximately 80-85% of the time. She states she is doing spin class, bowling, and yoga 60 minutes 3-5 times per week. Kellie had a birthday and Valentine's Day recently. She is disappointed that she gained weight. She is now back on plan. Her weight is 192 lb (87.1 kg) today and has had a weight gain of 2 lbs since her last visit. She has lost 20 lbs since starting treatment with [name].  Insulin Resistance Alicia Knapp has a diagnosis of insulin resistance based on her elevated fasting insulin level >5. Although Alicia Knapp's blood glucose readings are still under good control, insulin resistance puts her at greater risk of metabolic syndrome and diabetes. She is not taking metformin currently and continues to work on diet and exercise to decrease risk of diabetes. Denies polyphagia. Lab Results  Component Value Date   HGBA1C 5.5 11/09/2018     ASSESSMENT AND PLAN:  Insulin resistance  Class 1 obesity with serious comorbidity and body mass index (BMI) of 30.0 to 30.9 in adult, unspecified obesity type  PLAN:  Insulin Resistance Alicia Knapp will continue to work on weight loss, exercise, and decreasing simple carbohydrates in her diet to help decrease the risk of diabetes. We dicussed metformin including benefits and risks. She was informed that eating too many simple carbohydrates or too many calories at one sitting increases the likelihood of GI side effects. Alicia Knapp is not on metformin for now and prescription was not written today. Alicia Knapp agreed to follow-up with [name] as directed to monitor her progress.  I spent > than 50% of the 15 minute visit on counseling as documented in the note.  Obesity Alicia Knapp is currently in the action stage of change. As such, her goal is to continue with weight  loss efforts. She has agreed to follow the Category 2 plan + 1 calories. Alicia Knapp will continue current exercise regimen for weight loss and overall health benefits. We discussed the following Behavioral Modification Strategies today: planning for success.  Alicia Knapp has agreed to follow-up with our clinic in 3 weeks. She was informed of the importance of frequent follow up visits to maximize her success with intensive lifestyle modifications for her multiple health conditions.  ALLERGIES: Allergies  Allergen Reactions  . Erythromycin     Causes stomach cramps  . Penicillins     rash    MEDICATIONS: Current Outpatient Medications on File Prior to Visit  Medication Sig Dispense Refill  . amLODipine (NORVASC) 10 MG tablet TAKE 1 TABLET DAILY 90 tablet 4  . ARIPiprazole (ABILIFY) 2 MG tablet Take 2 mg by mouth daily.     . ARIPiprazole (ABILIFY) 5 MG tablet Take 1 tablet by mouth daily.  0  . atorvastatin (LIPITOR) 10 MG tablet TAKE 1 TABLET DAILY 90 tablet 3  . carvedilol (COREG) 6.25 MG tablet TAKE 1 TABLET TWICE A DAY WITH MEALS 180 tablet 1  . cholecalciferol (VITAMIN D3) 25 MCG (1000 UT) tablet Take 2,000 Units by mouth daily.    . fluticasone (FLONASE) 50 MCG/ACT nasal spray Place 2 sprays into both nostrils daily. 48 g 2  . hydrochlorothiazide (HYDRODIURIL) 25 MG tablet TAKE 1 TABLET DAILY 90 tablet 1  . levocetirizine (XYZAL) 5 MG tablet Take 1 tablet (5 mg total) by mouth every  evening. 90 tablet 3  . LORazepam (ATIVAN) 1 MG tablet Take 0.5 mg by mouth every 8 (eight) hours as needed.      [name] losartan (COZAAR) 50 MG tablet TAKE 1 TABLET DAILY 90 tablet 1   No current facility-administered medications on file prior to visit.     PAST MEDICAL HISTORY: Past Medical History:  Diagnosis Date  . Anxiety   . B12 deficiency   . Constipation   . Heart murmur   . Hyperlipidemia   . Hypertension   . Joint pain   . Postoperative nausea   . Prediabetes   . Swelling    lower ext  .  Vitamin D deficiency     PAST SURGICAL HISTORY: Past Surgical History:  Procedure Laterality Date  . ABDOMINAL HYSTERECTOMY    . APPENDECTOMY    . COLONOSCOPY  05/22/2015   Dr.Pyrtle  . POLYPECTOMY      SOCIAL HISTORY: Social History   Tobacco Use  . Smoking status: Former Smoker    Types: Cigarettes    Last attempt to quit: 09/17/2014    Years since quitting: 4.2  . Smokeless tobacco: Never Used  Substance Use Topics  . Alcohol use: No    Alcohol/week: 0.0 standard drinks  . Drug use: No    FAMILY HISTORY: Family History  Problem Relation Age of Onset  . Cancer Father        prostate  . Prostate cancer Father   . Diabetes Mother   . Thyroid disease Mother   . Hypertension Mother   . Hyperlipidemia Mother   . Kidney disease Mother   . Cancer Mother   . Sleep apnea Mother   . Obesity Mother   . Hypertension Sister   . Hypertension Brother   . Stroke Brother   . Colon cancer Brother 55       died 76 - 4-5 months after dx  . Arthritis Maternal Grandmother   . Esophageal cancer Neg Hx   . Rectal cancer Neg Hx   . Stomach cancer Neg Hx    ROS: Review of Systems  Constitutional: Negative for weight loss.  Endo/Heme/Allergies:       Negative for polyphagia. Negative for hypoglycemia.   PHYSICAL EXAM: Blood pressure 135/76, pulse (!) 51, temperature 98.2 F (36.8 C), temperature source Oral, height 5\' 6"  (1.676 m), weight 192 lb (87.1 kg), SpO2 98 %. Body mass index is 30.99 kg/m. Physical Exam Vitals signs reviewed.  Constitutional:      Appearance: Normal appearance. She is obese.  Cardiovascular:     Rate and Rhythm: Normal rate.     Pulses: Normal pulses.  Pulmonary:     Effort: Pulmonary effort is normal.     Breath sounds: Normal breath sounds.  Musculoskeletal: Normal range of motion.  Skin:    General: Skin is warm and dry.  Neurological:     Mental Status: She is alert and oriented to person, place, and time.  Psychiatric:         Behavior: Behavior normal.   RECENT LABS AND TESTS: BMET    Component Value Date/Time   NA 138 11/09/2018 1033   K 4.4 11/09/2018 1033   CL 99 11/09/2018 1033   CO2 23 11/09/2018 1033   GLUCOSE 97 11/09/2018 1033   GLUCOSE 98 05/18/2018 1801   BUN 15 11/09/2018 1033   CREATININE 0.84 11/09/2018 1033   CALCIUM 9.8 11/09/2018 1033   GFRNONAA 76 11/09/2018 1033   GFRAA 87 11/09/2018  1033   Lab Results  Component Value Date   HGBA1C 5.5 11/09/2018   HGBA1C 5.7 05/18/2018   HGBA1C 5.6 12/21/2017   Lab Results  Component Value Date   INSULIN 13.8 11/09/2018   INSULIN 15.4 08/03/2018   CBC    Component Value Date/Time   WBC 3.6 (L) 09/15/2017 1153   RBC 4.43 09/15/2017 1153   HGB 12.7 09/15/2017 1153   HCT 39.3 09/15/2017 1153   PLT 349.0 09/15/2017 1153   MCV 88.7 09/15/2017 1153   MCHC 32.3 09/15/2017 1153   RDW 14.3 09/15/2017 1153   LYMPHSABS 1.6 09/15/2017 1153   MONOABS 0.3 09/15/2017 1153   EOSABS 0.1 09/15/2017 1153   BASOSABS 0.0 09/15/2017 1153   Iron/TIBC/Ferritin/ %Sat No results found for: IRON, TIBC, FERRITIN, IRONPCTSAT Lipid Panel     Component Value Date/Time   CHOL 142 05/18/2018 1801   TRIG 106.0 05/18/2018 1801   HDL 45.50 05/18/2018 1801   CHOLHDL 3 05/18/2018 1801   VLDL 21.2 05/18/2018 1801   LDLCALC 75 05/18/2018 1801   LDLDIRECT 140.5 08/12/2013 0913   Hepatic Function Panel     Component Value Date/Time   PROT 7.6 11/09/2018 1033   ALBUMIN 4.5 11/09/2018 1033   AST 44 (H) 11/09/2018 1033   ALT 78 (H) 11/09/2018 1033   ALKPHOS 88 11/09/2018 1033   BILITOT 0.5 11/09/2018 1033   BILIDIR 0.0 02/22/2015 1206      Component Value Date/Time   TSH 1.760 08/03/2018 1324   TSH 1.81 09/15/2017 1153   TSH 1.16 12/15/2016 0904    Ref. Range 11/09/2018 10:33  Vitamin D, 25-Hydroxy Latest Ref Range: 30.0 - 100.0 ng/mL 64.5   OBESITY BEHAVIORAL INTERVENTION VISIT  Today's visit was #10   Starting weight: 212 lbs Starting date:  08/03/2018 Today's weight: 192 lbs Today's date: 12/21/2018 Total lbs lost to date: 20  ASK: We discussed the diagnosis of obesity with Alicia Knapp today and Elenor agreed to give [name] permission to discuss obesity behavioral modification therapy today.  ASSESS: Alicia Knapp has the diagnosis of obesity and her BMI today is 30.99. Alicia Knapp is in the action stage of change.   ADVISE: Alicia Knapp was educated on the multiple health risks of obesity as well as the benefit of weight loss to improve her health. She was advised of the need for long term treatment and the importance of lifestyle modifications to improve her current health and to decrease her risk of future health problems.  AGREE: Multiple dietary modification options and treatment options were discussed and  Alicia Knapp agreed to follow the recommendations documented in the above note.  ARRANGE: Alicia Knapp was educated on the importance of frequent visits to treat obesity as outlined per CMS and USPSTF guidelines and agreed to schedule her next follow up appointment today.  IMichaelene Song, am acting as Location manager for [employer], FNP-C.  I have reviewed the above documentation for accuracy and completeness, and I agree with the above.  - Ammy Lienhard, FNP-C.

## 2019-01-11 ENCOUNTER — Ambulatory Visit (INDEPENDENT_AMBULATORY_CARE_PROVIDER_SITE_OTHER): Admitting: Family Medicine

## 2019-01-11 ENCOUNTER — Encounter (INDEPENDENT_AMBULATORY_CARE_PROVIDER_SITE_OTHER): Payer: Self-pay | Admitting: Family Medicine

## 2019-01-11 VITALS — BP 136/76 | HR 55 | Temp 98.0°F | Ht 66.0 in | Wt 188.0 lb

## 2019-01-11 DIAGNOSIS — E669 Obesity, unspecified: Secondary | ICD-10-CM | POA: Diagnosis not present

## 2019-01-11 DIAGNOSIS — E8881 Metabolic syndrome: Secondary | ICD-10-CM | POA: Diagnosis not present

## 2019-01-11 DIAGNOSIS — I10 Essential (primary) hypertension: Secondary | ICD-10-CM | POA: Diagnosis not present

## 2019-01-11 DIAGNOSIS — Z6830 Body mass index (BMI) 30.0-30.9, adult: Secondary | ICD-10-CM

## 2019-01-11 MED ORDER — METFORMIN HCL 500 MG PO TABS: 500.0000 mg | ORAL_TABLET | Freq: Every day | ORAL | 0 refills | Status: DC

## 2019-01-11 NOTE — Progress Notes (Signed)
Office: [phone]  /  Fax: [phone]   HPI:   Chief Complaint: OBESITY Alicia Knapp is here to discuss her progress with her obesity treatment plan. She is on the Category 2 plan + 100 calories and is following her eating plan approximately 95 % of the time. She states she is bowling for 120 minutes, biking for 45 minutes, and doing yoga for 45 minutes 2 times per week. [name] is doing great on her plan and is very committed to sticking to the plan.  Her weight is 188 lb (85.3 kg) today and has had a weight loss of 4 pounds over a period of 3 weeks since her last visit. She has lost 24 lbs since starting treatment with [name].  Hypertension Alicia Knapp is a 63 y.o. female with hypertension. [name]'s blood pressure is currently well controlled on Norvasc, carvedilol, HCTZ, and losartan. She is working on weight loss to help control her blood pressure with the goal of decreasing her risk of heart attack and stroke.  Jaidee Stipe denies chest pain or shortness of breath.  Insulin Resistance Alicia Knapp has a diagnosis of insulin resistance based on her elevated fasting insulin level >5. Although Alicia Knapp's blood glucose readings are still under good control, insulin resistance puts her at greater risk of metabolic syndrome and diabetes. She has thought about taking metformin and we have discussed this in the past. She would like to try it. She continues to work on diet and exercise to decrease risk of diabetes. [name] admits polyphagia in the afternoon. Lab Results  Component Value Date   HGBA1C 5.5 11/09/2018    ASSESSMENT AND PLAN:  Essential hypertension  Insulin resistance - Plan: metFORMIN (GLUCOPHAGE) 500 MG tablet  Class 1 obesity with serious comorbidity and body mass index (BMI) of 30.0 to 30.9 in adult, unspecified obesity type  PLAN:  Hypertension We discussed sodium restriction, working on healthy weight loss, and a regular exercise program as the means to achieve improved blood pressure control.   We will continue to monitor her blood pressure as well as her progress with the above lifestyle modifications. She will continue her medications as prescribed and will watch for signs of hypotension as she continues her lifestyle modifications. Rosetta agreed with this plan and agreed to follow up as directed in 3 weeks.  Insulin Resistance [name] will continue to work on weight loss, exercise, and decreasing simple carbohydrates in her diet to help decrease the risk of diabetes. She was informed that eating too many simple carbohydrates or too many calories at one sitting increases the likelihood of GI side effects. [name] agreed to start metformin 500 mg at lunch #30 with no refills and prescription was written today. Jobeth agreed to follow up with [name] as directed to monitor her progress.   Obesity Alicia Knapp is currently in the action stage of change. As such, her goal is to continue with weight loss efforts. She has agreed to follow the Category 2 plan + 100 calories. Alicia Knapp has been instructed to work up to a goal of 150 minutes of combined cardio and strengthening exercise per week for weight loss and overall health benefits. We discussed the following Behavioral Modification Strategies today: planning for success.  Alicia Knapp has agreed to follow up with our clinic in 3 weeks. She was informed of the importance of frequent follow up visits to maximize her success with intensive lifestyle modifications for her multiple health conditions.  ALLERGIES: Allergies  Allergen Reactions  . Erythromycin  Causes stomach cramps  . Penicillins     rash    MEDICATIONS: Current Outpatient Medications on File Prior to Visit  Medication Sig Dispense Refill  . amLODipine (NORVASC) 10 MG tablet TAKE 1 TABLET DAILY 90 tablet 4  . ARIPiprazole (ABILIFY) 2 MG tablet Take 2 mg by mouth daily.     . ARIPiprazole (ABILIFY) 5 MG tablet Take 1 tablet by mouth daily.  0  . atorvastatin (LIPITOR) 10 MG tablet TAKE 1 TABLET  DAILY 90 tablet 3  . carvedilol (COREG) 6.25 MG tablet TAKE 1 TABLET TWICE A DAY WITH MEALS 180 tablet 1  . cholecalciferol (VITAMIN D3) 25 MCG (1000 UT) tablet Take 2,000 Units by mouth daily.    . fluticasone (FLONASE) 50 MCG/ACT nasal spray Place 2 sprays into both nostrils daily. 48 g 2  . hydrochlorothiazide (HYDRODIURIL) 25 MG tablet TAKE 1 TABLET DAILY 90 tablet 1  . levocetirizine (XYZAL) 5 MG tablet Take 1 tablet (5 mg total) by mouth every evening. 90 tablet 3  . LORazepam (ATIVAN) 1 MG tablet Take 0.5 mg by mouth every 8 (eight) hours as needed.      [name] losartan (COZAAR) 50 MG tablet TAKE 1 TABLET DAILY 90 tablet 1   No current facility-administered medications on file prior to visit.     PAST MEDICAL HISTORY: Past Medical History:  Diagnosis Date  . Anxiety   . B12 deficiency   . Constipation   . Heart murmur   . Hyperlipidemia   . Hypertension   . Joint pain   . Postoperative nausea   . Prediabetes   . Swelling    lower ext  . Vitamin D deficiency     PAST SURGICAL HISTORY: Past Surgical History:  Procedure Laterality Date  . ABDOMINAL HYSTERECTOMY    . APPENDECTOMY    . COLONOSCOPY  05/22/2015   Dr.Pyrtle  . POLYPECTOMY      SOCIAL HISTORY: Social History   Tobacco Use  . Smoking status: Former Smoker    Types: Cigarettes    Last attempt to quit: 09/17/2014    Years since quitting: 4.3  . Smokeless tobacco: Never Used  Substance Use Topics  . Alcohol use: No    Alcohol/week: 0.0 standard drinks  . Drug use: No    FAMILY HISTORY: Family History  Problem Relation Age of Onset  . Cancer Father        prostate  . Prostate cancer Father   . Diabetes Mother   . Thyroid disease Mother   . Hypertension Mother   . Hyperlipidemia Mother   . Kidney disease Mother   . Cancer Mother   . Sleep apnea Mother   . Obesity Mother   . Hypertension Sister   . Hypertension Brother   . Stroke Brother   . Colon cancer Brother 94       died 14 - 4-5 months  after dx  . Arthritis Maternal Grandmother   . Esophageal cancer Neg Hx   . Rectal cancer Neg Hx   . Stomach cancer Neg Hx    ROS: Review of Systems  Constitutional: Positive for weight loss.  Respiratory: Negative for shortness of breath.   Cardiovascular: Negative for chest pain.  Endo/Heme/Allergies:       Positive for polyphagia.   PHYSICAL EXAM: Blood pressure 136/76, pulse (!) 55, temperature 98 F (36.7 C), height 5\' 6"  (1.676 m), weight 188 lb (85.3 kg), SpO2 100 %. Body mass index is 30.34 kg/m. Physical Exam  Vitals signs reviewed.  Constitutional:      Appearance: Normal appearance. She is obese.  Cardiovascular:     Rate and Rhythm: Normal rate.  Pulmonary:     Effort: Pulmonary effort is normal.  Musculoskeletal: Normal range of motion.  Skin:    General: Skin is warm and dry.  Neurological:     Mental Status: She is alert and oriented to person, place, and time.  Psychiatric:        Mood and Affect: Mood normal.        Behavior: Behavior normal.    RECENT LABS AND TESTS: BMET    Component Value Date/Time   NA 138 11/09/2018 1033   K 4.4 11/09/2018 1033   CL 99 11/09/2018 1033   CO2 23 11/09/2018 1033   GLUCOSE 97 11/09/2018 1033   GLUCOSE 98 05/18/2018 1801   BUN 15 11/09/2018 1033   CREATININE 0.84 11/09/2018 1033   CALCIUM 9.8 11/09/2018 1033   GFRNONAA 76 11/09/2018 1033   GFRAA 87 11/09/2018 1033   Lab Results  Component Value Date   HGBA1C 5.5 11/09/2018   HGBA1C 5.7 05/18/2018   HGBA1C 5.6 12/21/2017   Lab Results  Component Value Date   INSULIN 13.8 11/09/2018   INSULIN 15.4 08/03/2018   CBC    Component Value Date/Time   WBC 3.6 (L) 09/15/2017 1153   RBC 4.43 09/15/2017 1153   HGB 12.7 09/15/2017 1153   HCT 39.3 09/15/2017 1153   PLT 349.0 09/15/2017 1153   MCV 88.7 09/15/2017 1153   MCHC 32.3 09/15/2017 1153   RDW 14.3 09/15/2017 1153   LYMPHSABS 1.6 09/15/2017 1153   MONOABS 0.3 09/15/2017 1153   EOSABS 0.1 09/15/2017  1153   BASOSABS 0.0 09/15/2017 1153   Iron/TIBC/Ferritin/ %Sat No results found for: IRON, TIBC, FERRITIN, IRONPCTSAT Lipid Panel     Component Value Date/Time   CHOL 142 05/18/2018 1801   TRIG 106.0 05/18/2018 1801   HDL 45.50 05/18/2018 1801   CHOLHDL 3 05/18/2018 1801   VLDL 21.2 05/18/2018 1801   LDLCALC 75 05/18/2018 1801   LDLDIRECT 140.5 08/12/2013 0913   Hepatic Function Panel     Component Value Date/Time   PROT 7.6 11/09/2018 1033   ALBUMIN 4.5 11/09/2018 1033   AST 44 (H) 11/09/2018 1033   ALT 78 (H) 11/09/2018 1033   ALKPHOS 88 11/09/2018 1033   BILITOT 0.5 11/09/2018 1033   BILIDIR 0.0 02/22/2015 1206      Component Value Date/Time   TSH 1.760 08/03/2018 1324   TSH 1.81 09/15/2017 1153   TSH 1.16 12/15/2016 0904   Results for TRACI, GAFFORD (MRN [id number]) as of 01/11/2019 14:25  Ref. Range 11/09/2018 10:33  Vitamin D, 25-Hydroxy Latest Ref Range: 30.0 - 100.0 ng/mL 64.5   OBESITY BEHAVIORAL INTERVENTION VISIT  Today's visit was # 11  Starting weight: 212 lbs Starting date: 08/03/18 Today's weight : Weight: 188 lb (85.3 kg)  Today's date: 01/11/2019 Total lbs lost to date: 24    01/11/2019  Height 5\' 6"  (1.676 m)  Weight 188 lb (85.3 kg)  BMI (Calculated) 30.36  BLOOD PRESSURE - SYSTOLIC 035  BLOOD PRESSURE - DIASTOLIC 76   Body Fat % 40 %  Total Body Water (lbs) 70.4 lbs   ASK: We discussed the diagnosis of obesity with Marton Redwood today and Kellyann agreed to give [name] permission to discuss obesity behavioral modification therapy today.  ASSESS: Loreli has the diagnosis of obesity and her BMI today is 30.36.  Ellean is in the action stage of change.   ADVISE: Emberleigh was educated on the multiple health risks of obesity as well as the benefit of weight loss to improve her health. She was advised of the need for long term treatment and the importance of lifestyle modifications to improve her current health and to decrease her risk of future health  problems.  AGREE: Multiple dietary modification options and treatment options were discussed and Carol agreed to follow the recommendations documented in the above note.  ARRANGE: Glanda was educated on the importance of frequent visits to treat obesity as outlined per CMS and USPSTF guidelines and agreed to schedule her next follow up appointment today.  Lenward Chancellor, CMA, am acting as Location manager for [employer], FNP-C.  I have reviewed the above documentation for accuracy and completeness, and I agree with the above.  -  , FNP-C.

## 2019-01-26 ENCOUNTER — Encounter (INDEPENDENT_AMBULATORY_CARE_PROVIDER_SITE_OTHER): Payer: Self-pay

## 2019-02-01 ENCOUNTER — Ambulatory Visit (INDEPENDENT_AMBULATORY_CARE_PROVIDER_SITE_OTHER): Admitting: Family Medicine

## 2019-02-01 ENCOUNTER — Encounter (INDEPENDENT_AMBULATORY_CARE_PROVIDER_SITE_OTHER): Payer: Self-pay | Admitting: Family Medicine

## 2019-02-01 ENCOUNTER — Other Ambulatory Visit: Payer: Self-pay

## 2019-02-01 DIAGNOSIS — E669 Obesity, unspecified: Secondary | ICD-10-CM

## 2019-02-01 DIAGNOSIS — E8881 Metabolic syndrome: Secondary | ICD-10-CM | POA: Diagnosis not present

## 2019-02-01 DIAGNOSIS — Z6830 Body mass index (BMI) 30.0-30.9, adult: Secondary | ICD-10-CM | POA: Diagnosis not present

## 2019-02-01 DIAGNOSIS — E559 Vitamin D deficiency, unspecified: Secondary | ICD-10-CM | POA: Diagnosis not present

## 2019-02-01 MED ORDER — METFORMIN HCL 500 MG PO TABS: 500.0000 mg | ORAL_TABLET | Freq: Two times a day (BID) | ORAL | 0 refills | Status: DC

## 2019-02-02 ENCOUNTER — Encounter (INDEPENDENT_AMBULATORY_CARE_PROVIDER_SITE_OTHER): Payer: Self-pay | Admitting: Family Medicine

## 2019-02-02 NOTE — Progress Notes (Signed)
Office: [phone]  /  Fax: [phone] TeleHealth Visit:  Alicia Knapp has consented to this TeleHealth visit today via FaceTime. The patient is located at home, the provider is located at the [facility] office. The participants in this visit include the listed provider and patient and any and all parties involved.   HPI:   Chief Complaint: OBESITY Alicia Knapp is here to discuss her progress with her obesity treatment plan. She is on the Category 2 plan +100 calories and is following her eating plan approximately 85 % of the time. She states she is riding her bike for  45 minutes 3 times per week. Alicia Knapp weighed 187.4 pounds on her scales at home this morning. She is having trouble finding bread on the plan. She is meeting her protein goals. We were unable to weigh the patient today for this TeleHealth visit. She feels as if she has lost 1 pound since her last visit. She has lost 25 lbs since starting treatment with [name].  Insulin Resistance Alicia Knapp has a diagnosis of insulin resistance based on her elevated fasting insulin level >5. Although Alicia Knapp's blood glucose readings are still under good control, insulin resistance puts her at greater risk of metabolic syndrome and diabetes. She is on metformin. Alicia Knapp started metformin at the last visit and metformin is helping with appetite. She continues to work on diet and exercise to decrease risk of diabetes.  Vitamin D deficiency Alicia Knapp has a diagnosis of vitamin D deficiency. Alicia Knapp is currently taking vit D and she is at goal. Unique denies nausea, vomiting or muscle weakness.  ASSESSMENT AND PLAN:  Insulin resistance - Plan: metFORMIN (GLUCOPHAGE) 500 MG tablet  Vitamin D deficiency  Class 1 obesity with serious comorbidity and body mass index (BMI) of 30.0 to 30.9 in adult, unspecified obesity type  PLAN:  Insulin Resistance Alicia Knapp will continue to work on weight loss, exercise, and decreasing simple carbohydrates in her diet to help  decrease the risk of diabetes.  Alicia Knapp agreed to continue Metformin 500 mg qAM #60 with no refills (patient has 30 pills already). She agrees to  follow up with [name] as directed to monitor her progress.  Vitamin D Deficiency Alicia Knapp was informed that low vitamin D levels contributes to fatigue and are associated with obesity, breast, and colon cancer. She will continue OTC vitamin D and will follow up for routine testing of vitamin D, at least 2-3 times per year. She was informed of the risk of over-replacement of vitamin D and agrees to not increase her dose unless she discusses this with [name] first.  Obesity Alicia Knapp is currently in the action stage of change. As such, her goal is to continue with weight loss efforts She has agreed to follow the Category 2 plan +100 calories [name] will continue riding her bike for 45 minutes 3 times per week for weight loss and overall health benefits. We discussed the following Behavioral Modification Strategies today: planning for success and keeping healthy foods in the home We discussed substitutions for bread today.  [name] has agreed to follow up with our clinic in 2 to 3 weeks. She was informed of the importance of frequent follow up visits to maximize her success with intensive lifestyle modifications for her multiple health conditions.  ALLERGIES: Allergies  Allergen Reactions  . Erythromycin     Causes stomach cramps  . Penicillins     rash    MEDICATIONS: Current Outpatient Medications on File Prior to Visit  Medication Sig Dispense  Refill  . amLODipine (NORVASC) 10 MG tablet TAKE 1 TABLET DAILY 90 tablet 4  . ARIPiprazole (ABILIFY) 2 MG tablet Take 2 mg by mouth daily.     . ARIPiprazole (ABILIFY) 5 MG tablet Take 1 tablet by mouth daily.  0  . atorvastatin (LIPITOR) 10 MG tablet TAKE 1 TABLET DAILY 90 tablet 3  . carvedilol (COREG) 6.25 MG tablet TAKE 1 TABLET TWICE A DAY WITH MEALS 180 tablet 1  . cholecalciferol (VITAMIN D3) 25 MCG (1000 UT) tablet  Take 2,000 Units by mouth daily.    . fluticasone (FLONASE) 50 MCG/ACT nasal spray Place 2 sprays into both nostrils daily. 48 g 2  . hydrochlorothiazide (HYDRODIURIL) 25 MG tablet TAKE 1 TABLET DAILY 90 tablet 1  . levocetirizine (XYZAL) 5 MG tablet Take 1 tablet (5 mg total) by mouth every evening. 90 tablet 3  . LORazepam (ATIVAN) 1 MG tablet Take 0.5 mg by mouth every 8 (eight) hours as needed.      [name] losartan (COZAAR) 50 MG tablet TAKE 1 TABLET DAILY 90 tablet 1   No current facility-administered medications on file prior to visit.     PAST MEDICAL HISTORY: Past Medical History:  Diagnosis Date  . Anxiety   . B12 deficiency   . Constipation   . Heart murmur   . Hyperlipidemia   . Hypertension   . Joint pain   . Postoperative nausea   . Prediabetes   . Swelling    lower ext  . Vitamin D deficiency     PAST SURGICAL HISTORY: Past Surgical History:  Procedure Laterality Date  . ABDOMINAL HYSTERECTOMY    . APPENDECTOMY    . COLONOSCOPY  05/22/2015   Dr.Pyrtle  . POLYPECTOMY      SOCIAL HISTORY: Social History   Tobacco Use  . Smoking status: Former Smoker    Types: Cigarettes    Last attempt to quit: 09/17/2014    Years since quitting: 4.3  . Smokeless tobacco: Never Used  Substance Use Topics  . Alcohol use: No    Alcohol/week: 0.0 standard drinks  . Drug use: No    FAMILY HISTORY: Family History  Problem Relation Age of Onset  . Cancer Father        prostate  . Prostate cancer Father   . Diabetes Mother   . Thyroid disease Mother   . Hypertension Mother   . Hyperlipidemia Mother   . Kidney disease Mother   . Cancer Mother   . Sleep apnea Mother   . Obesity Mother   . Hypertension Sister   . Hypertension Brother   . Stroke Brother   . Colon cancer Brother 7       died 2 - 4-5 months after dx  . Arthritis Maternal Grandmother   . Esophageal cancer Neg Hx   . Rectal cancer Neg Hx   . Stomach cancer Neg Hx     ROS: Review of Systems   Constitutional: Positive for weight loss.  Gastrointestinal: Negative for nausea and vomiting.  Musculoskeletal:       Negative for muscle weakness    PHYSICAL EXAM: Pt in no acute distress  RECENT LABS AND TESTS: BMET    Component Value Date/Time   NA 138 11/09/2018 1033   K 4.4 11/09/2018 1033   CL 99 11/09/2018 1033   CO2 23 11/09/2018 1033   GLUCOSE 97 11/09/2018 1033   GLUCOSE 98 05/18/2018 1801   BUN 15 11/09/2018 1033   CREATININE 0.84  11/09/2018 1033   CALCIUM 9.8 11/09/2018 1033   GFRNONAA 76 11/09/2018 1033   GFRAA 87 11/09/2018 1033   Lab Results  Component Value Date   HGBA1C 5.5 11/09/2018   HGBA1C 5.7 05/18/2018   HGBA1C 5.6 12/21/2017   Lab Results  Component Value Date   INSULIN 13.8 11/09/2018   INSULIN 15.4 08/03/2018   CBC    Component Value Date/Time   WBC 3.6 (L) 09/15/2017 1153   RBC 4.43 09/15/2017 1153   HGB 12.7 09/15/2017 1153   HCT 39.3 09/15/2017 1153   PLT 349.0 09/15/2017 1153   MCV 88.7 09/15/2017 1153   MCHC 32.3 09/15/2017 1153   RDW 14.3 09/15/2017 1153   LYMPHSABS 1.6 09/15/2017 1153   MONOABS 0.3 09/15/2017 1153   EOSABS 0.1 09/15/2017 1153   BASOSABS 0.0 09/15/2017 1153   Iron/TIBC/Ferritin/ %Sat No results found for: IRON, TIBC, FERRITIN, IRONPCTSAT Lipid Panel     Component Value Date/Time   CHOL 142 05/18/2018 1801   TRIG 106.0 05/18/2018 1801   HDL 45.50 05/18/2018 1801   CHOLHDL 3 05/18/2018 1801   VLDL 21.2 05/18/2018 1801   LDLCALC 75 05/18/2018 1801   LDLDIRECT 140.5 08/12/2013 0913   Hepatic Function Panel     Component Value Date/Time   PROT 7.6 11/09/2018 1033   ALBUMIN 4.5 11/09/2018 1033   AST 44 (H) 11/09/2018 1033   ALT 78 (H) 11/09/2018 1033   ALKPHOS 88 11/09/2018 1033   BILITOT 0.5 11/09/2018 1033   BILIDIR 0.0 02/22/2015 1206      Component Value Date/Time   TSH 1.760 08/03/2018 1324   TSH 1.81 09/15/2017 1153   TSH 1.16 12/15/2016 0904     Ref. Range 11/09/2018 10:33  Vitamin D,  25-Hydroxy Latest Ref Range: 30.0 - 100.0 ng/mL 64.5     I, Doreene Nest, am acting as Location manager for [employer].  I have reviewed the above documentation for accuracy and completeness, and I agree with the above.  - Solveig Fangman, FNP-C.

## 2019-02-21 ENCOUNTER — Ambulatory Visit (INDEPENDENT_AMBULATORY_CARE_PROVIDER_SITE_OTHER): Admitting: Family Medicine

## 2019-02-21 ENCOUNTER — Encounter (INDEPENDENT_AMBULATORY_CARE_PROVIDER_SITE_OTHER): Payer: Self-pay | Admitting: Family Medicine

## 2019-02-21 ENCOUNTER — Other Ambulatory Visit: Payer: Self-pay

## 2019-02-21 DIAGNOSIS — E8881 Metabolic syndrome: Secondary | ICD-10-CM

## 2019-02-21 DIAGNOSIS — Z6830 Body mass index (BMI) 30.0-30.9, adult: Secondary | ICD-10-CM | POA: Diagnosis not present

## 2019-02-21 DIAGNOSIS — E669 Obesity, unspecified: Secondary | ICD-10-CM

## 2019-02-21 NOTE — Progress Notes (Signed)
Office: [phone]  /  Fax: [phone] TeleHealth Visit:  Alicia Knapp has verbally consented to this TeleHealth visit today. The patient is located at home, the provider is located at the [facility] office. The participants in this visit include the listed provider and patient. The visit was conducted today via FaceTime.  HPI:   Chief Complaint: OBESITY Alicia Knapp is here to discuss her progress with her obesity treatment plan. She is on the Category 2 plan and is following her eating plan approximately 85% of the time. She states she is exercising on a recumbent bike 60 minutes 3 times per week. Alicia Knapp states she weighed 187 lbs today, reflecting a 0.4 lb weight loss. She reports she is finding food for the plan more easily now. We were unable to weigh the patient today for this TeleHealth visit. She feels as if she has lost 0.4 lb since her last visit. She has lost 24 lbs since starting treatment with Alicia Knapp.  Insulin Resistance Alicia Knapp has a diagnosis of insulin resistance based on her elevated fasting insulin level >5. Although Alicia Knapp's blood glucose readings are still under good control, insulin resistance puts her at greater risk of metabolic syndrome and diabetes. She is taking metformin currently and is tolerating it well. She continues to work on diet and exercise to decrease risk of diabetes. No polyphagia. No nausea, vomiting, or diarrhea. Lab Results  Component Value Date   HGBA1C 5.5 11/09/2018    ASSESSMENT AND PLAN:  Insulin resistance  Class 1 obesity with serious comorbidity and body mass index (BMI) of 30.0 to 30.9 in adult, unspecified obesity type  PLAN:  Insulin Resistance Alicia Knapp will continue to work on weight loss, exercise, and decreasing simple carbohydrates in her diet to help decrease the risk of diabetes. We dicussed metformin including benefits and risks. She was informed that eating too many simple carbohydrates or too many calories at one sitting  increases the likelihood of GI side effects. Alicia Knapp will continue her meal plan and agrees to follow-up with Alicia Knapp as directed to monitor her progress.  I spent > than 50% of the 15 minute visit on counseling as documented in the note.  Obesity Alicia Knapp is currently in the action stage of change. As such, her goal is to continue with weight loss efforts. She has agreed to follow the Category 2 plan. Alicia Knapp has been instructed to continue her current exercise regimen for weight loss and overall health benefits. We discussed the following Behavioral Modification Strategies today: increasing lean protein intake and planning for success.  Alicia Knapp has agreed to follow-up with our clinic in 2 weeks. She was informed of the importance of frequent follow-up visits to maximize her success with intensive lifestyle modifications for her multiple health conditions.  ALLERGIES: Allergies  Allergen Reactions   Erythromycin     Causes stomach cramps   Penicillins     rash    MEDICATIONS: Current Outpatient Medications on File Prior to Visit  Medication Sig Dispense Refill   amLODipine (NORVASC) 10 MG tablet TAKE 1 TABLET DAILY 90 tablet 4   ARIPiprazole (ABILIFY) 2 MG tablet Take 2 mg by mouth daily.      ARIPiprazole (ABILIFY) 5 MG tablet Take 1 tablet by mouth daily.  0   atorvastatin (LIPITOR) 10 MG tablet TAKE 1 TABLET DAILY 90 tablet 3   carvedilol (COREG) 6.25 MG tablet TAKE 1 TABLET TWICE A DAY WITH MEALS 180 tablet 1   cholecalciferol (VITAMIN D3) 25 MCG (1000 UT)  tablet Take 2,000 Units by mouth daily.     fluticasone (FLONASE) 50 MCG/ACT nasal spray Place 2 sprays into both nostrils daily. 48 g 2   hydrochlorothiazide (HYDRODIURIL) 25 MG tablet TAKE 1 TABLET DAILY 90 tablet 1   levocetirizine (XYZAL) 5 MG tablet Take 1 tablet (5 mg total) by mouth every evening. 90 tablet 3   LORazepam (ATIVAN) 1 MG tablet Take 0.5 mg by mouth every 8 (eight) hours as needed.       losartan (COZAAR) 50  MG tablet TAKE 1 TABLET DAILY 90 tablet 1   metFORMIN (GLUCOPHAGE) 500 MG tablet Take 1 tablet (500 mg total) by mouth 2 (two) times daily with a meal. 60 tablet 0   No current facility-administered medications on file prior to visit.     PAST MEDICAL HISTORY: Past Medical History:  Diagnosis Date   Anxiety    B12 deficiency    Constipation    Heart murmur    Hyperlipidemia    Hypertension    Joint pain    Postoperative nausea    Prediabetes    Swelling    lower ext   Vitamin D deficiency     PAST SURGICAL HISTORY: Past Surgical History:  Procedure Laterality Date   ABDOMINAL HYSTERECTOMY     APPENDECTOMY     COLONOSCOPY  05/22/2015   Dr.Pyrtle   POLYPECTOMY      SOCIAL HISTORY: Social History   Tobacco Use   Smoking status: Former Smoker    Types: Cigarettes    Last attempt to quit: 09/17/2014    Years since quitting: 4.4   Smokeless tobacco: Never Used  Substance Use Topics   Alcohol use: No    Alcohol/week: 0.0 standard drinks   Drug use: No    FAMILY HISTORY: Family History  Problem Relation Age of Onset   Cancer Father        prostate   Prostate cancer Father    Diabetes Mother    Thyroid disease Mother    Hypertension Mother    Hyperlipidemia Mother    Kidney disease Mother    Cancer Mother    Sleep apnea Mother    Obesity Mother    Hypertension Sister    Hypertension Brother    Stroke Brother    Colon cancer Brother 31       died 9 - 4-5 months after dx   Arthritis Maternal Grandmother    Esophageal cancer Neg Hx    Rectal cancer Neg Hx    Stomach cancer Neg Hx    ROS: Review of Systems  Gastrointestinal: Negative for diarrhea, nausea and vomiting.  Endo/Heme/Allergies:       Negative for polyphagia.   PHYSICAL EXAM: Pt in no acute distress  RECENT LABS AND TESTS: BMET    Component Value Date/Time   NA 138 11/09/2018 1033   K 4.4 11/09/2018 1033   CL 99 11/09/2018 1033   CO2 23  11/09/2018 1033   GLUCOSE 97 11/09/2018 1033   GLUCOSE 98 05/18/2018 1801   BUN 15 11/09/2018 1033   CREATININE 0.84 11/09/2018 1033   CALCIUM 9.8 11/09/2018 1033   GFRNONAA 76 11/09/2018 1033   GFRAA 87 11/09/2018 1033   Lab Results  Component Value Date   HGBA1C 5.5 11/09/2018   HGBA1C 5.7 05/18/2018   HGBA1C 5.6 12/21/2017   Lab Results  Component Value Date   INSULIN 13.8 11/09/2018   INSULIN 15.4 08/03/2018   CBC    Component Value Date/Time  WBC 3.6 (L) 09/15/2017 1153   RBC 4.43 09/15/2017 1153   HGB 12.7 09/15/2017 1153   HCT 39.3 09/15/2017 1153   PLT 349.0 09/15/2017 1153   MCV 88.7 09/15/2017 1153   MCHC 32.3 09/15/2017 1153   RDW 14.3 09/15/2017 1153   LYMPHSABS 1.6 09/15/2017 1153   MONOABS 0.3 09/15/2017 1153   EOSABS 0.1 09/15/2017 1153   BASOSABS 0.0 09/15/2017 1153   Iron/TIBC/Ferritin/ %Sat No results found for: IRON, TIBC, FERRITIN, IRONPCTSAT Lipid Panel     Component Value Date/Time   CHOL 142 05/18/2018 1801   TRIG 106.0 05/18/2018 1801   HDL 45.50 05/18/2018 1801   CHOLHDL 3 05/18/2018 1801   VLDL 21.2 05/18/2018 1801   LDLCALC 75 05/18/2018 1801   LDLDIRECT 140.5 08/12/2013 0913   Hepatic Function Panel     Component Value Date/Time   PROT 7.6 11/09/2018 1033   ALBUMIN 4.5 11/09/2018 1033   AST 44 (H) 11/09/2018 1033   ALT 78 (H) 11/09/2018 1033   ALKPHOS 88 11/09/2018 1033   BILITOT 0.5 11/09/2018 1033   BILIDIR 0.0 02/22/2015 1206      Component Value Date/Time   TSH 1.760 08/03/2018 1324   TSH 1.81 09/15/2017 1153   TSH 1.16 12/15/2016 0904    Results for ARDYCE, HEYER (MRN [id number]) as of 02/21/2019 17:05  Ref. Range 11/09/2018 10:33  Vitamin D, 25-Hydroxy Latest Ref Range: 30.0 - 100.0 ng/mL 64.5   I, Michaelene Song, am acting as Location manager for [employer], FNP-C.  I have reviewed the above documentation for accuracy and completeness, and I agree with the above.  - Flem Enderle, FNP-C.

## 2019-02-22 ENCOUNTER — Encounter (INDEPENDENT_AMBULATORY_CARE_PROVIDER_SITE_OTHER): Payer: Self-pay | Admitting: Family Medicine

## 2019-03-07 ENCOUNTER — Encounter (INDEPENDENT_AMBULATORY_CARE_PROVIDER_SITE_OTHER): Payer: Self-pay | Admitting: Family Medicine

## 2019-03-07 ENCOUNTER — Ambulatory Visit (INDEPENDENT_AMBULATORY_CARE_PROVIDER_SITE_OTHER): Admitting: Family Medicine

## 2019-03-07 ENCOUNTER — Other Ambulatory Visit: Payer: Self-pay

## 2019-03-07 DIAGNOSIS — E669 Obesity, unspecified: Secondary | ICD-10-CM

## 2019-03-07 DIAGNOSIS — E8881 Metabolic syndrome: Secondary | ICD-10-CM

## 2019-03-07 DIAGNOSIS — E559 Vitamin D deficiency, unspecified: Secondary | ICD-10-CM | POA: Diagnosis not present

## 2019-03-07 DIAGNOSIS — Z6830 Body mass index (BMI) 30.0-30.9, adult: Secondary | ICD-10-CM | POA: Diagnosis not present

## 2019-03-07 MED ORDER — METFORMIN HCL 500 MG PO TABS: 500.0000 mg | ORAL_TABLET | Freq: Every day | ORAL | 0 refills | Status: DC

## 2019-03-08 ENCOUNTER — Encounter (INDEPENDENT_AMBULATORY_CARE_PROVIDER_SITE_OTHER): Payer: Self-pay | Admitting: Family Medicine

## 2019-03-08 NOTE — Progress Notes (Signed)
Office: [phone]  /  Fax: [phone] TeleHealth Visit:  Alicia Knapp has verbally consented to this TeleHealth visit today. The patient is located at home, the provider is located at the [employer] [facility] office. The participants in this visit include the listed provider and patient. The visit was conducted today via FaceTime.  HPI:   Chief Complaint: OBESITY Alicia Knapp is here to discuss her progress with her obesity treatment plan. She is on the Category 2 plan and is following her eating plan approximately 90% of the time. She states she is riding a bike 56 minutes 3 times per week. Beth reports she weighed 186 lbs today, down 1 lb. She states she is sticking to the program very well. We were unable to weigh the patient today for this TeleHealth visit. She states she weighed 186 lbs today. She has lost 24 lbs since starting treatment with [name].  Insulin Resistance Alicia Knapp has a diagnosis of insulin resistance based on her elevated fasting insulin level >5. Although Alicia Knapp's blood glucose readings are still under good control, insulin resistance puts her at greater risk of metabolic syndrome and diabetes. She reports polyphagia after lunch. Alicia Knapp is taking metformin currently and is tolerating it well. She continues to work on diet and exercise to decrease risk of diabetes.  Vitamin D deficiency Alicia Knapp has a diagnosis of Vitamin D deficiency, which is at goal. Her last Vitamin D level was reported at 64.5 on 11/09/2018. She is currently taking OTC Vit D daily and denies nausea, vomiting or muscle weakness. She does report constipation from Vitamin D and would like to take OTC Vitamin D QOD.  ASSESSMENT AND PLAN:  Insulin resistance - Plan: metFORMIN (GLUCOPHAGE) 500 MG tablet  Vitamin D deficiency  Class 1 obesity with serious comorbidity and body mass index (BMI) of 30.0 to 30.9 in adult, unspecified obesity type  PLAN:  Insulin Resistance Alicia Knapp will continue to work on weight loss,  exercise, and decreasing simple carbohydrates in her diet to help decrease the risk of diabetes. We dicussed metformin including benefits and risks. She was informed that eating too many simple carbohydrates or too many calories at one sitting increases the likelihood of GI side effects. Amanii is currently taking metformin. She will change her dose to QD at lunch and a prescription was written today for #30 with 0 refills. Alicia Knapp agrees to follow-up with our clinic in 2 weeks.  Vitamin D Deficiency Alicia Knapp was informed that low Vitamin D levels contributes to fatigue and are associated with obesity, breast, and colon cancer. She agrees to start taking OTC Vit D QOD and will follow-up for routine testing of Vitamin D, at least 2-3 times per year. She was informed of the risk of over-replacement of Vitamin D and agrees to not increase her dose unless she discusses this with [name] first. Alicia Knapp agrees to follow-up with our clinic in 2 weeks.  Obesity Alicia Knapp is currently in the action stage of change. As such, her goal is to continue with weight loss efforts. She has agreed to follow the Category 2 plan and journal 200-300 calories + 20 grams of protein at breakfast. She may use [employer]. Maisey has been instructed to continue her current exercise regimen for weight loss and overall health benefits. We discussed the following Behavioral Modification Strategies today: planning for success.  Alicia Knapp has agreed to follow-up with our clinic in 2 weeks. She was informed of the importance of frequent follow-up visits to maximize her success with  intensive lifestyle modifications for her multiple health conditions.  ALLERGIES: Allergies  Allergen Reactions  . Erythromycin     Causes stomach cramps  . Penicillins     rash    MEDICATIONS: Current Outpatient Medications on File Prior to Visit  Medication Sig Dispense Refill  . amLODipine (NORVASC) 10 MG tablet TAKE 1 TABLET DAILY 90 tablet 4  . ARIPiprazole  (ABILIFY) 2 MG tablet Take 2 mg by mouth daily.     . ARIPiprazole (ABILIFY) 5 MG tablet Take 1 tablet by mouth daily.  0  . atorvastatin (LIPITOR) 10 MG tablet TAKE 1 TABLET DAILY 90 tablet 3  . carvedilol (COREG) 6.25 MG tablet TAKE 1 TABLET TWICE A DAY WITH MEALS 180 tablet 1  . cholecalciferol (VITAMIN D3) 25 MCG (1000 UT) tablet Take 2,000 Units by mouth daily.    . fluticasone (FLONASE) 50 MCG/ACT nasal spray Place 2 sprays into both nostrils daily. 48 g 2  . hydrochlorothiazide (HYDRODIURIL) 25 MG tablet TAKE 1 TABLET DAILY 90 tablet 1  . levocetirizine (XYZAL) 5 MG tablet Take 1 tablet (5 mg total) by mouth every evening. 90 tablet 3  . LORazepam (ATIVAN) 1 MG tablet Take 0.5 mg by mouth every 8 (eight) hours as needed.      Alicia Knapp Kitchen losartan (COZAAR) 50 MG tablet TAKE 1 TABLET DAILY 90 tablet 1   No current facility-administered medications on file prior to visit.     PAST MEDICAL HISTORY: Past Medical History:  Diagnosis Date  . Anxiety   . B12 deficiency   . Constipation   . Heart murmur   . Hyperlipidemia   . Hypertension   . Joint pain   . Postoperative nausea   . Prediabetes   . Swelling    lower ext  . Vitamin D deficiency     PAST SURGICAL HISTORY: Past Surgical History:  Procedure Laterality Date  . ABDOMINAL HYSTERECTOMY    . APPENDECTOMY    . COLONOSCOPY  05/22/2015   Dr.Pyrtle  . POLYPECTOMY      SOCIAL HISTORY: Social History   Tobacco Use  . Smoking status: Former Smoker    Types: Cigarettes    Last attempt to quit: 09/17/2014    Years since quitting: 4.4  . Smokeless tobacco: Never Used  Substance Use Topics  . Alcohol use: No    Alcohol/week: 0.0 standard drinks  . Drug use: No    FAMILY HISTORY: Family History  Problem Relation Age of Onset  . Cancer Father        prostate  . Prostate cancer Father   . Diabetes Mother   . Thyroid disease Mother   . Hypertension Mother   . Hyperlipidemia Mother   . Kidney disease Mother   . Cancer  Mother   . Sleep apnea Mother   . Obesity Mother   . Hypertension Sister   . Hypertension Brother   . Stroke Brother   . Colon cancer Brother 59       died 57 - 4-5 months after dx  . Arthritis Maternal Grandmother   . Esophageal cancer Neg Hx   . Rectal cancer Neg Hx   . Stomach cancer Neg Hx    ROS: Review of Systems  Gastrointestinal: Positive for constipation (from Vitamin D). Negative for nausea and vomiting.  Musculoskeletal:       Negative for muscle weakness.  Endo/Heme/Allergies:       Positive for polyphagia after lunch.   PHYSICAL EXAM: Pt in no acute  distress  RECENT LABS AND TESTS: BMET    Component Value Date/Time   NA 138 11/09/2018 1033   K 4.4 11/09/2018 1033   CL 99 11/09/2018 1033   CO2 23 11/09/2018 1033   GLUCOSE 97 11/09/2018 1033   GLUCOSE 98 05/18/2018 1801   BUN 15 11/09/2018 1033   CREATININE 0.84 11/09/2018 1033   CALCIUM 9.8 11/09/2018 1033   GFRNONAA 76 11/09/2018 1033   GFRAA 87 11/09/2018 1033   Lab Results  Component Value Date   HGBA1C 5.5 11/09/2018   HGBA1C 5.7 05/18/2018   HGBA1C 5.6 12/21/2017   Lab Results  Component Value Date   INSULIN 13.8 11/09/2018   INSULIN 15.4 08/03/2018   CBC    Component Value Date/Time   WBC 3.6 (L) 09/15/2017 1153   RBC 4.43 09/15/2017 1153   HGB 12.7 09/15/2017 1153   HCT 39.3 09/15/2017 1153   PLT 349.0 09/15/2017 1153   MCV 88.7 09/15/2017 1153   MCHC 32.3 09/15/2017 1153   RDW 14.3 09/15/2017 1153   LYMPHSABS 1.6 09/15/2017 1153   MONOABS 0.3 09/15/2017 1153   EOSABS 0.1 09/15/2017 1153   BASOSABS 0.0 09/15/2017 1153   Iron/TIBC/Ferritin/ %Sat No results found for: IRON, TIBC, FERRITIN, IRONPCTSAT Lipid Panel     Component Value Date/Time   CHOL 142 05/18/2018 1801   TRIG 106.0 05/18/2018 1801   HDL 45.50 05/18/2018 1801   CHOLHDL 3 05/18/2018 1801   VLDL 21.2 05/18/2018 1801   LDLCALC 75 05/18/2018 1801   LDLDIRECT 140.5 08/12/2013 0913   Hepatic Function Panel      Component Value Date/Time   PROT 7.6 11/09/2018 1033   ALBUMIN 4.5 11/09/2018 1033   AST 44 (H) 11/09/2018 1033   ALT 78 (H) 11/09/2018 1033   ALKPHOS 88 11/09/2018 1033   BILITOT 0.5 11/09/2018 1033   BILIDIR 0.0 02/22/2015 1206      Component Value Date/Time   TSH 1.760 08/03/2018 1324   TSH 1.81 09/15/2017 1153   TSH 1.16 12/15/2016 0904   Results for JHORDYN, HOOPINGARNER (MRN [id number]) as of 03/08/2019 09:36  Ref. Range 11/09/2018 10:33  Vitamin D, 25-Hydroxy Latest Ref Range: 30.0 - 100.0 ng/mL 64.5   I, Michaelene Song, am acting as Location manager for [employer], FNP-C.  I have reviewed the above documentation for accuracy and completeness, and I agree with the above.  - Chonte Ricke, FNP-C.

## 2019-03-22 ENCOUNTER — Encounter (INDEPENDENT_AMBULATORY_CARE_PROVIDER_SITE_OTHER): Payer: Self-pay | Admitting: Family Medicine

## 2019-03-22 ENCOUNTER — Ambulatory Visit (INDEPENDENT_AMBULATORY_CARE_PROVIDER_SITE_OTHER): Admitting: Family Medicine

## 2019-03-22 ENCOUNTER — Other Ambulatory Visit: Payer: Self-pay

## 2019-03-22 DIAGNOSIS — E669 Obesity, unspecified: Secondary | ICD-10-CM

## 2019-03-22 DIAGNOSIS — Z6830 Body mass index (BMI) 30.0-30.9, adult: Secondary | ICD-10-CM | POA: Diagnosis not present

## 2019-03-22 DIAGNOSIS — E8881 Metabolic syndrome: Secondary | ICD-10-CM

## 2019-03-22 NOTE — Progress Notes (Signed)
Office: [phone]  /  Fax: [phone] TeleHealth Visit:  Alicia Knapp has verbally consented to this TeleHealth visit today. The patient is located at home, the provider is located at the [employer] [facility] office. The participants in this visit include the listed provider and patient. The visit was conducted today via FaceTime.  HPI:   Chief Complaint: OBESITY Alicia Knapp is here to discuss her progress with her obesity treatment plan. She is on the Category 2 plan and is following her eating plan approximately 95% of the time. She states she is riding a bike 3 times per week and burning 200 calories daily and doing free weights 2 times per week. Alicia Knapp states she weighed 184 lbs today, reflecting a 2.1 lb weight loss. She is very happy with her progress. She is drinking more water. She is nearly at her goal weight of 170-175 lbs. We were unable to weigh the patient today for this TeleHealth visit. She feels as if she has lost 2.1 lbs since her last visit. She has lost 24 lbs since starting treatment with [name].  Insulin Resistance Alicia Knapp has a diagnosis of insulin resistance based on her elevated fasting insulin level >5. Although Alicia Knapp's blood glucose readings are still under good control, insulin resistance puts her at greater risk of metabolic syndrome and diabetes. She is taking metformin currently in the a.m. and continues to work on diet and exercise to decrease risk of diabetes. She denies polyphagia. Lab Results  Component Value Date   HGBA1C 5.5 11/09/2018    ASSESSMENT AND PLAN:  Insulin resistance  Class 1 obesity with serious comorbidity and body mass index (BMI) of 30.0 to 30.9 in adult, unspecified obesity type  PLAN:  Insulin Resistance Alicia Knapp will continue to work on weight loss, exercise, and decreasing simple carbohydrates in her diet to help decrease the risk of diabetes.  Alicia Knapp will continue metformin (no prescription needed) and will follow-up with [name] as directed to  monitor her progress.  I spent > than 50% of the 15 minute visit on counseling as documented in the note.  Obesity Alicia Knapp is currently in the action stage of change. As such, her goal is to continue with weight loss efforts. She has agreed to follow the Category 2 plan and journal 200-300 calories + 20 grams of protein at breakfast. Alicia Knapp has been instructed to continue her current exercise regimen for weight loss and overall health benefits. We discussed the following Behavioral Modification Strategies today: increase H20 intake and planning for success.  Alicia Knapp has agreed to follow-up with our clinic in 2-3 weeks. She was informed of the importance of frequent follow-up visits to maximize her success with intensive lifestyle modifications for her multiple health conditions.  ALLERGIES: Allergies  Allergen Reactions   Erythromycin     Causes stomach cramps   Penicillins     rash    MEDICATIONS: Current Outpatient Medications on File Prior to Visit  Medication Sig Dispense Refill   amLODipine (NORVASC) 10 MG tablet TAKE 1 TABLET DAILY 90 tablet 4   ARIPiprazole (ABILIFY) 2 MG tablet Take 2 mg by mouth daily.      ARIPiprazole (ABILIFY) 5 MG tablet Take 1 tablet by mouth daily.  0   atorvastatin (LIPITOR) 10 MG tablet TAKE 1 TABLET DAILY 90 tablet 3   carvedilol (COREG) 6.25 MG tablet TAKE 1 TABLET TWICE A DAY WITH MEALS 180 tablet 1   cholecalciferol (VITAMIN D3) 25 MCG (1000 UT) tablet Take 2,000 Units by mouth  daily.     fluticasone (FLONASE) 50 MCG/ACT nasal spray Place 2 sprays into both nostrils daily. 48 g 2   hydrochlorothiazide (HYDRODIURIL) 25 MG tablet TAKE 1 TABLET DAILY 90 tablet 1   levocetirizine (XYZAL) 5 MG tablet Take 1 tablet (5 mg total) by mouth every evening. 90 tablet 3   LORazepam (ATIVAN) 1 MG tablet Take 0.5 mg by mouth every 8 (eight) hours as needed.       losartan (COZAAR) 50 MG tablet TAKE 1 TABLET DAILY 90 tablet 1   metFORMIN (GLUCOPHAGE)  500 MG tablet Take 1 tablet (500 mg total) by mouth daily. 30 tablet 0   No current facility-administered medications on file prior to visit.     PAST MEDICAL HISTORY: Past Medical History:  Diagnosis Date   Anxiety    B12 deficiency    Constipation    Heart murmur    Hyperlipidemia    Hypertension    Joint pain    Postoperative nausea    Prediabetes    Swelling    lower ext   Vitamin D deficiency     PAST SURGICAL HISTORY: Past Surgical History:  Procedure Laterality Date   ABDOMINAL HYSTERECTOMY     APPENDECTOMY     COLONOSCOPY  05/22/2015   Dr.Pyrtle   POLYPECTOMY      SOCIAL HISTORY: Social History   Tobacco Use   Smoking status: Former Smoker    Types: Cigarettes    Last attempt to quit: 09/17/2014    Years since quitting: 4.5   Smokeless tobacco: Never Used  Substance Use Topics   Alcohol use: No    Alcohol/week: 0.0 standard drinks   Drug use: No    FAMILY HISTORY: Family History  Problem Relation Age of Onset   Cancer Father        prostate   Prostate cancer Father    Diabetes Mother    Thyroid disease Mother    Hypertension Mother    Hyperlipidemia Mother    Kidney disease Mother    Cancer Mother    Sleep apnea Mother    Obesity Mother    Hypertension Sister    Hypertension Brother    Stroke Brother    Colon cancer Brother 85       died 48 - 4-5 months after dx   Arthritis Maternal Grandmother    Esophageal cancer Neg Hx    Rectal cancer Neg Hx    Stomach cancer Neg Hx    ROS: Review of Systems  Endo/Heme/Allergies:       Negative for polyphagia.   PHYSICAL EXAM: Pt in no acute distress  RECENT LABS AND TESTS: BMET    Component Value Date/Time   NA 138 11/09/2018 1033   K 4.4 11/09/2018 1033   CL 99 11/09/2018 1033   CO2 23 11/09/2018 1033   GLUCOSE 97 11/09/2018 1033   GLUCOSE 98 05/18/2018 1801   BUN 15 11/09/2018 1033   CREATININE 0.84 11/09/2018 1033   CALCIUM 9.8 11/09/2018  1033   GFRNONAA 76 11/09/2018 1033   GFRAA 87 11/09/2018 1033   Lab Results  Component Value Date   HGBA1C 5.5 11/09/2018   HGBA1C 5.7 05/18/2018   HGBA1C 5.6 12/21/2017   Lab Results  Component Value Date   INSULIN 13.8 11/09/2018   INSULIN 15.4 08/03/2018   CBC    Component Value Date/Time   WBC 3.6 (L) 09/15/2017 1153   RBC 4.43 09/15/2017 1153   HGB 12.7 09/15/2017 1153  HCT 39.3 09/15/2017 1153   PLT 349.0 09/15/2017 1153   MCV 88.7 09/15/2017 1153   MCHC 32.3 09/15/2017 1153   RDW 14.3 09/15/2017 1153   LYMPHSABS 1.6 09/15/2017 1153   MONOABS 0.3 09/15/2017 1153   EOSABS 0.1 09/15/2017 1153   BASOSABS 0.0 09/15/2017 1153   Iron/TIBC/Ferritin/ %Sat No results found for: IRON, TIBC, FERRITIN, IRONPCTSAT Lipid Panel     Component Value Date/Time   CHOL 142 05/18/2018 1801   TRIG 106.0 05/18/2018 1801   HDL 45.50 05/18/2018 1801   CHOLHDL 3 05/18/2018 1801   VLDL 21.2 05/18/2018 1801   LDLCALC 75 05/18/2018 1801   LDLDIRECT 140.5 08/12/2013 0913   Hepatic Function Panel     Component Value Date/Time   PROT 7.6 11/09/2018 1033   ALBUMIN 4.5 11/09/2018 1033   AST 44 (H) 11/09/2018 1033   ALT 78 (H) 11/09/2018 1033   ALKPHOS 88 11/09/2018 1033   BILITOT 0.5 11/09/2018 1033   BILIDIR 0.0 02/22/2015 1206      Component Value Date/Time   TSH 1.760 08/03/2018 1324   TSH 1.81 09/15/2017 1153   TSH 1.16 12/15/2016 0904   Results for YAMELI, DELAMATER (MRN [id number]) as of 03/22/2019 12:31  Ref. Range 11/09/2018 10:33  Vitamin D, 25-Hydroxy Latest Ref Range: 30.0 - 100.0 ng/mL 64.5    I, Michaelene Song, am acting as Location manager for [employer], FNP-C.  I have reviewed the above documentation for accuracy and completeness, and I agree with the above.  - Quintus Premo, FNP-C.

## 2019-04-05 ENCOUNTER — Ambulatory Visit (INDEPENDENT_AMBULATORY_CARE_PROVIDER_SITE_OTHER): Admitting: Family Medicine

## 2019-04-05 ENCOUNTER — Other Ambulatory Visit: Payer: Self-pay

## 2019-04-05 ENCOUNTER — Encounter (INDEPENDENT_AMBULATORY_CARE_PROVIDER_SITE_OTHER): Payer: Self-pay | Admitting: Family Medicine

## 2019-04-05 VITALS — BP 150/74 | HR 54 | Temp 98.6°F | Ht 66.0 in | Wt 182.0 lb

## 2019-04-05 DIAGNOSIS — E8881 Metabolic syndrome: Secondary | ICD-10-CM

## 2019-04-05 DIAGNOSIS — E538 Deficiency of other specified B group vitamins: Secondary | ICD-10-CM

## 2019-04-05 DIAGNOSIS — R5383 Other fatigue: Secondary | ICD-10-CM

## 2019-04-05 DIAGNOSIS — E7849 Other hyperlipidemia: Secondary | ICD-10-CM

## 2019-04-05 DIAGNOSIS — E669 Obesity, unspecified: Secondary | ICD-10-CM

## 2019-04-05 DIAGNOSIS — Z6830 Body mass index (BMI) 30.0-30.9, adult: Secondary | ICD-10-CM | POA: Diagnosis not present

## 2019-04-05 NOTE — Progress Notes (Signed)
Office: [phone]  /  Fax: [phone]   HPI:   Chief Complaint: OBESITY Kenzli is here to discuss her progress with her obesity treatment plan. She is on the Category 2 plan and journaling 200-300 calories +20 grams of protein at breakfast and is following her eating plan approximately 90% of the time. She states she is doing cardio 60 minutes 3 times per week and strength training 30 minutes 2 times per week. Zilla reports she weighed 184 lbs at home on 03/22/2019. She states her goal weight is 172 lbs. She is not bored with Category 2. She has done very well and has lost weight during the quarantine. Her weight is 182 lb (82.6 kg) today and has had a weight loss of 4 pounds over a period of 12 weeks since her last visit. She has lost 30 lbs since starting treatment with [name].  Insulin Resistance Haily has a diagnosis of insulin resistance based on her elevated fasting insulin level >5. Although Edwena's blood glucose readings are still under good control, insulin resistance puts her at greater risk of metabolic syndrome and diabetes. She is not taking metformin currently and continues to work on diet and exercise to decrease risk of diabetes. No polyphagia. Lab Results  Component Value Date   HGBA1C 5.5 11/09/2018    B12 Deficiency Jaynell has a history of B12 insufficiency and is not on B12 supplementation currently but had been in the past. She denies paresthesias.   Hyperlipidemia Anacristina has hyperlipidemia and has been trying to improve her cholesterol levels with intensive lifestyle modification including a low saturated fat diet, exercise and weight loss. Her LDL is at goal; triglycerides and HDL within normal limits. She is on atorvastatin 10 mg daily and denies any chest pain or shortness of breath. The 10-year ASCVD risk score [name] DC Brooke Bonito., et al., 2013) is: 9%   Values used to calculate the score:     Age: 56 years     Sex: Female     Is Non-Hispanic African American: Yes  Diabetic: No     Tobacco smoker: No     Systolic Blood Pressure: 638 mmHg     Is BP treated: Yes     HDL Cholesterol: 45.5 mg/dL     Total Cholesterol: 142 mg/dL  Fatigue Brandyce reports occasional fatigue. She denies hot/cold intolerance. No palpitations.  ASSESSMENT AND PLAN:  Insulin resistance - Plan: Comprehensive metabolic panel, Hemoglobin A1c, Insulin, random  B12 nutritional deficiency - Plan: Vitamin B12, Folate, VITAMIN D 25 Hydroxy (Vit-D Deficiency, Fractures)  Other hyperlipidemia - Plan: Lipid Panel With LDL/HDL Ratio  Other fatigue - Plan: T3, T4, free, TSH  Class 1 obesity with serious comorbidity and body mass index (BMI) of 30.0 to 30.9 in adult, unspecified obesity type - Starting BMI greater then 30  PLAN:  Insulin Resistance Anayelli will continue to work on weight loss, exercise, and decreasing simple carbohydrates in her diet to help decrease the risk of diabetes. Taiesha will have fasting insulin, glucose, and A1c checked and will follow-up with [name] as directed to monitor her progress.  B12 Deficiency Atticus will work on increasing B12 rich foods in her diet. B12 supplementation was not prescribed today. We will plan on checking her B12 level.  Hyperlipidemia Lorenna was informed of the American Heart Association Guidelines emphasizing intensive lifestyle modifications as the first line treatment for hyperlipidemia. We discussed many lifestyle modifications today in depth, and Keonna will continue to work on decreasing saturated fats such  as fatty red meat, butter and many fried foods. Asuzena will have fasting lipid panel checked and will continue atorvastatin. She will also increase vegetables and lean protein in her diet and continue to work on exercise and weight loss efforts.  Fatigue Nakeshia was informed that her fatigue may be related to obesity, depression or many other causes. Labs will be ordered, and in the meanwhile Kimika has agreed to work on diet, exercise and weight  loss to help with fatigue. Proper sleep hygiene was discussed including the need for 7-8 hours of quality sleep each night. Dahlila will have thyroid panel checked.  Obesity Aislyn is currently in the action stage of change. As such, her goal is to continue with weight loss efforts. She has agreed to follow the Category 2 plan. We discussed maintenance and Aydee wants to stay on Category 2 and add calories to maintain weight. Joeanna has been instructed to continue her current exercise regimen for weight loss and overall health benefits. We discussed the following Behavioral Modification Strategies today: planning for success.  Kima has agreed to follow-up with our clinic in 2 weeks. She was informed of the importance of frequent follow-up visits to maximize her success with intensive lifestyle modifications for her multiple health conditions.  ALLERGIES: Allergies  Allergen Reactions   Erythromycin     Causes stomach cramps   Penicillins     rash    MEDICATIONS: Current Outpatient Medications on File Prior to Visit  Medication Sig Dispense Refill   amLODipine (NORVASC) 10 MG tablet TAKE 1 TABLET DAILY 90 tablet 4   ARIPiprazole (ABILIFY) 2 MG tablet Take 2 mg by mouth daily.      ARIPiprazole (ABILIFY) 5 MG tablet Take 1 tablet by mouth daily.  0   atorvastatin (LIPITOR) 10 MG tablet TAKE 1 TABLET DAILY 90 tablet 3   carvedilol (COREG) 6.25 MG tablet TAKE 1 TABLET TWICE A DAY WITH MEALS 180 tablet 1   cholecalciferol (VITAMIN D3) 25 MCG (1000 UT) tablet Take 2,000 Units by mouth daily.     fluticasone (FLONASE) 50 MCG/ACT nasal spray Place 2 sprays into both nostrils daily. 48 g 2   hydrochlorothiazide (HYDRODIURIL) 25 MG tablet TAKE 1 TABLET DAILY 90 tablet 1   levocetirizine (XYZAL) 5 MG tablet Take 1 tablet (5 mg total) by mouth every evening. 90 tablet 3   losartan (COZAAR) 50 MG tablet TAKE 1 TABLET DAILY 90 tablet 1   metFORMIN (GLUCOPHAGE) 500 MG tablet Take 1 tablet (500  mg total) by mouth daily. 30 tablet 0   No current facility-administered medications on file prior to visit.     PAST MEDICAL HISTORY: Past Medical History:  Diagnosis Date   Anxiety    B12 deficiency    Constipation    Heart murmur    Hyperlipidemia    Hypertension    Joint pain    Postoperative nausea    Prediabetes    Swelling    lower ext   Vitamin D deficiency     PAST SURGICAL HISTORY: Past Surgical History:  Procedure Laterality Date   ABDOMINAL HYSTERECTOMY     APPENDECTOMY     COLONOSCOPY  05/22/2015   Dr.Pyrtle   POLYPECTOMY      SOCIAL HISTORY: Social History   Tobacco Use   Smoking status: Former Smoker    Types: Cigarettes    Last attempt to quit: 09/17/2014    Years since quitting: 4.5   Smokeless tobacco: Never Used  Substance Use Topics  Alcohol use: No    Alcohol/week: 0.0 standard drinks   Drug use: No    FAMILY HISTORY: Family History  Problem Relation Age of Onset   Cancer Father        prostate   Prostate cancer Father    Diabetes Mother    Thyroid disease Mother    Hypertension Mother    Hyperlipidemia Mother    Kidney disease Mother    Cancer Mother    Sleep apnea Mother    Obesity Mother    Hypertension Sister    Hypertension Brother    Stroke Brother    Colon cancer Brother 65       died 27 - 4-5 months after dx   Arthritis Maternal Grandmother    Esophageal cancer Neg Hx    Rectal cancer Neg Hx    Stomach cancer Neg Hx    ROS: Review of Systems  Constitutional: Positive for malaise/fatigue.  Respiratory: Negative for shortness of breath.   Cardiovascular: Negative for chest pain and palpitations.  Neurological:       Negative for paresthesias.  Endo/Heme/Allergies:       Negative for polyphagia. Negative for hot/cold intolerance.   PHYSICAL EXAM: Blood pressure (!) 150/74, pulse (!) 54, temperature 98.6 F (37 C), height 5\' 6"  (1.676 m), weight 182 lb (82.6 kg), SpO2 92  %. Body mass index is 29.38 kg/m. Physical Exam Vitals signs reviewed.  Constitutional:      Appearance: Normal appearance. She is obese.  Cardiovascular:     Rate and Rhythm: Normal rate.     Pulses: Normal pulses.  Pulmonary:     Effort: Pulmonary effort is normal.     Breath sounds: Normal breath sounds.  Musculoskeletal: Normal range of motion.  Skin:    General: Skin is warm and dry.  Neurological:     Mental Status: She is alert and oriented to person, place, and time.  Psychiatric:        Behavior: Behavior normal.    RECENT LABS AND TESTS: BMET    Component Value Date/Time   NA 138 11/09/2018 1033   K 4.4 11/09/2018 1033   CL 99 11/09/2018 1033   CO2 23 11/09/2018 1033   GLUCOSE 97 11/09/2018 1033   GLUCOSE 98 05/18/2018 1801   BUN 15 11/09/2018 1033   CREATININE 0.84 11/09/2018 1033   CALCIUM 9.8 11/09/2018 1033   GFRNONAA 76 11/09/2018 1033   GFRAA 87 11/09/2018 1033   Lab Results  Component Value Date   HGBA1C 5.5 11/09/2018   HGBA1C 5.7 05/18/2018   HGBA1C 5.6 12/21/2017   Lab Results  Component Value Date   INSULIN 13.8 11/09/2018   INSULIN 15.4 08/03/2018   CBC    Component Value Date/Time   WBC 3.6 (L) 09/15/2017 1153   RBC 4.43 09/15/2017 1153   HGB 12.7 09/15/2017 1153   HCT 39.3 09/15/2017 1153   PLT 349.0 09/15/2017 1153   MCV 88.7 09/15/2017 1153   MCHC 32.3 09/15/2017 1153   RDW 14.3 09/15/2017 1153   LYMPHSABS 1.6 09/15/2017 1153   MONOABS 0.3 09/15/2017 1153   EOSABS 0.1 09/15/2017 1153   BASOSABS 0.0 09/15/2017 1153   Iron/TIBC/Ferritin/ %Sat No results found for: IRON, TIBC, FERRITIN, IRONPCTSAT Lipid Panel     Component Value Date/Time   CHOL 142 05/18/2018 1801   TRIG 106.0 05/18/2018 1801   HDL 45.50 05/18/2018 1801   CHOLHDL 3 05/18/2018 1801   VLDL 21.2 05/18/2018 1801   LDLCALC 75  05/18/2018 1801   LDLDIRECT 140.5 08/12/2013 0913   Hepatic Function Panel     Component Value Date/Time   PROT 7.6 11/09/2018  1033   ALBUMIN 4.5 11/09/2018 1033   AST 44 (H) 11/09/2018 1033   ALT 78 (H) 11/09/2018 1033   ALKPHOS 88 11/09/2018 1033   BILITOT 0.5 11/09/2018 1033   BILIDIR 0.0 02/22/2015 1206      Component Value Date/Time   TSH 1.760 08/03/2018 1324   TSH 1.81 09/15/2017 1153   TSH 1.16 12/15/2016 0904   Results for DORRAINE, ELLENDER (MRN [id number]) as of 04/05/2019 15:12  Ref. Range 11/09/2018 10:33  Vitamin D, 25-Hydroxy Latest Ref Range: 30.0 - 100.0 ng/mL 64.5    OBESITY BEHAVIORAL INTERVENTION VISIT  Today's visit was #16  Starting weight: 212  Starting date: 08/03/2018 Today's weight: 182  Today's date: 04/05/2019 Total lbs lost to date: 30 At least 15 minutes were spent on discussing the following behavioral intervention visit.  ASK: We discussed the diagnosis of obesity with Marton Redwood today and Oneita agreed to give [name] permission to discuss obesity behavioral modification therapy today.  ASSESS: Donzella has the diagnosis of obesity and her BMI today is 29.38. Zeya is in the action stage of change.   ADVISE: Temekia was educated on the multiple health risks of obesity as well as the benefit of weight loss to improve her health. She was advised of the need for long term treatment and the importance of lifestyle modifications to improve her current health and to decrease her risk of future health problems.  AGREE: Multiple dietary modification options and treatment options were discussed and  Fabiola agreed to follow the recommendations documented in the above note.  ARRANGE: Yatzil was educated on the importance of frequent visits to treat obesity as outlined per CMS and USPSTF guidelines and agreed to schedule her next follow up appointment today.  IMichaelene Song, am acting as Location manager for [employer], FNP-C.  I have reviewed the above documentation for accuracy and completeness, and I agree with the above.  - Dawn Whitmire, FNP-C.

## 2019-04-06 ENCOUNTER — Encounter (INDEPENDENT_AMBULATORY_CARE_PROVIDER_SITE_OTHER): Payer: Self-pay | Admitting: Family Medicine

## 2019-04-09 LAB — COMPREHENSIVE METABOLIC PANEL
ALT: 16 IU/L (ref 0–32)
AST: 18 IU/L (ref 0–40)
Albumin/Globulin Ratio: 1.3 (ref 1.2–2.2)
Albumin: 4.4 g/dL (ref 3.8–4.8)
Alkaline Phosphatase: 76 IU/L (ref 39–117)
BUN/Creatinine Ratio: 16 (ref 12–28)
BUN: 14 mg/dL (ref 8–27)
Bilirubin Total: 0.3 mg/dL (ref 0.0–1.2)
CO2: 24 mmol/L (ref 20–29)
Calcium: 10 mg/dL (ref 8.7–10.3)
Chloride: 103 mmol/L (ref 96–106)
Creatinine, Ser: 0.9 mg/dL (ref 0.57–1.00)
GFR calc Af Amer: 80 mL/min/{1.73_m2} (ref >59)
GFR calc non Af Amer: 69 mL/min/{1.73_m2} (ref >59)
Globulin, Total: 3.3 g/dL (ref 1.5–4.5)
Glucose: 105 mg/dL — ABNORMAL HIGH (ref 65–99)
Potassium: 4.7 mmol/L (ref 3.5–5.2)
Sodium: 143 mmol/L (ref 134–144)
Total Protein: 7.7 g/dL (ref 6.0–8.5)

## 2019-04-09 LAB — T4, FREE: Free T4: 1.12 ng/dL (ref 0.82–1.77)

## 2019-04-09 LAB — LIPID PANEL WITH LDL/HDL RATIO
Cholesterol, Total: 148 mg/dL (ref 100–199)
HDL: 48 mg/dL (ref >39)
LDL Calculated: 87 mg/dL (ref 0–99)
LDl/HDL Ratio: 1.8 ratio (ref 0.0–3.2)
Triglycerides: 67 mg/dL (ref 0–149)
VLDL Cholesterol Cal: 13 mg/dL (ref 5–40)

## 2019-04-09 LAB — HEMOGLOBIN A1C
Est. average glucose Bld gHb Est-mCnc: 108 mg/dL
Hgb A1c MFr Bld: 5.4 % (ref 4.8–5.6)

## 2019-04-09 LAB — TSH: TSH: 2.53 u[IU]/mL (ref 0.450–4.500)

## 2019-04-09 LAB — T3: T3, Total: 105 ng/dL (ref 71–180)

## 2019-04-09 LAB — INSULIN, RANDOM: INSULIN: 20.3 u[IU]/mL (ref 2.6–24.9)

## 2019-04-09 LAB — VITAMIN B12: Vitamin B-12: 471 pg/mL (ref 232–1245)

## 2019-04-09 LAB — VITAMIN D 25 HYDROXY (VIT D DEFICIENCY, FRACTURES): Vit D, 25-Hydroxy: 40.1 ng/mL (ref 30.0–100.0)

## 2019-04-09 LAB — FOLATE: Folate: 7.6 ng/mL (ref >3.0)

## 2019-04-18 ENCOUNTER — Ambulatory Visit (INDEPENDENT_AMBULATORY_CARE_PROVIDER_SITE_OTHER): Admitting: Family Medicine

## 2019-04-25 ENCOUNTER — Encounter (INDEPENDENT_AMBULATORY_CARE_PROVIDER_SITE_OTHER): Payer: Self-pay | Admitting: Bariatrics

## 2019-04-25 ENCOUNTER — Other Ambulatory Visit: Payer: Self-pay

## 2019-04-25 ENCOUNTER — Ambulatory Visit (INDEPENDENT_AMBULATORY_CARE_PROVIDER_SITE_OTHER): Admitting: Bariatrics

## 2019-04-25 VITALS — BP 131/73 | HR 65 | Temp 98.9°F | Ht 66.0 in | Wt 179.0 lb

## 2019-04-25 DIAGNOSIS — E88819 Insulin resistance, unspecified: Secondary | ICD-10-CM

## 2019-04-25 DIAGNOSIS — E538 Deficiency of other specified B group vitamins: Secondary | ICD-10-CM

## 2019-04-25 DIAGNOSIS — Z9189 Other specified personal risk factors, not elsewhere classified: Secondary | ICD-10-CM | POA: Diagnosis not present

## 2019-04-25 DIAGNOSIS — Z6830 Body mass index (BMI) 30.0-30.9, adult: Secondary | ICD-10-CM

## 2019-04-25 DIAGNOSIS — E559 Vitamin D deficiency, unspecified: Secondary | ICD-10-CM

## 2019-04-25 DIAGNOSIS — E669 Obesity, unspecified: Secondary | ICD-10-CM

## 2019-04-25 DIAGNOSIS — E8881 Metabolic syndrome: Secondary | ICD-10-CM | POA: Diagnosis not present

## 2019-04-25 MED ORDER — METFORMIN HCL 500 MG PO TABS: 500.0000 mg | ORAL_TABLET | Freq: Every day | ORAL | 0 refills | Status: DC

## 2019-04-26 NOTE — Progress Notes (Signed)
Office: [phone]  /  Fax: [phone]   HPI:   Chief Complaint: OBESITY Alicia Knapp is here to discuss her progress with her obesity treatment plan. She is on the Category 2 plan and is following her eating plan approximately 85% of the time. She states she is weight training and doing cardio 2-3 times per week. Alicia Knapp has lost 3 lbs and is doing well over. Her goal weight is 170 lbs.  Her weight is 179 lb (81.2 kg) today and has had a weight loss of 3 pounds over a period of 3 weeks since her last visit. She has lost 33 lbs since starting treatment with [name].  Insulin Resistance Alicia Knapp has a diagnosis of insulin resistance based on her elevated fasting insulin level >5. Although Alicia Knapp's blood glucose readings are still under good control, insulin resistance puts her at greater risk of metabolic syndrome and diabetes. Her last A1c was 5.4 on 04/08/2019 and her insulin was 20.3.  She is not taking metformin currently and continues to work on diet and exercise to decrease risk of diabetes. Denies polyphagia.  At risk for diabetes Alicia Knapp is at higher than averagerisk for developing diabetes due to her obesity. She currently denies polyuria or polydipsia.  Vitamin D deficiency Alicia Knapp has a diagnosis of Vitamin D deficiency. Her last Vitamin D level was reported to be 40.1 on 04/08/2019, down from 64.5 on 11/09/2018. She is currently taking OTC Vit D 2,000 IU and denies nausea, vomiting or muscle weakness.  B12 Deficiency Alicia Knapp has a diagnosis of B12 insufficiency and her last Vitamin B12 level was reported to be normal at 471 on 04/08/2019.  ASSESSMENT AND PLAN:  Insulin resistance - Plan: metFORMIN (GLUCOPHAGE) 500 MG tablet  Vitamin D deficiency  B12 deficiency  At risk for diabetes mellitus  Class 1 obesity with serious comorbidity and body mass index (BMI) of 30.0 to 30.9 in adult, unspecified obesity type - Starting BMI greater then 30  PLAN:  Insulin Resistance Alicia Knapp will continue to work  on weight loss, exercise, and decreasing simple carbohydrates in her diet to help decrease the risk of diabetes. We dicussed metformin including benefits and risks. She was informed that eating too many simple carbohydrates or too many calories at one sitting increases the likelihood of GI side effects. Alicia Knapp was advised to decrease carbohydrates and increase protein. She was given a prescription for metformin 500 mg 1 PO QD #30 with 0 refills and she agrees to follow-up with our clinic in 2 weeks.  Diabetes risk counseling Alicia Knapp was given extended (15 minutes) diabetes prevention counseling today. She is 63 y.o. female and has risk factors for diabetes including obesity. We discussed intensive lifestyle modifications today with an emphasis on weight loss as well as increasing exercise and decreasing simple carbohydrates in her diet.  Vitamin D Deficiency Alicia Knapp was informed that low Vitamin D levels contributes to fatigue and are associated with obesity, breast, and colon cancer. She agrees to continue taking OTC Vit D @ 2,000 IU daily and will follow-up for routine testing of Vitamin D, at least 2-3 times per year. She was informed of the risk of over-replacement of Vitamin D and agrees to not increase her dose unless she discusses this with [name] first. Alicia Knapp agrees to follow-up with our clinic in 2 weeks.  B12 Deficiency Alicia Knapp will work on increasing B12 rich foods in her diet. She will continue taking Vitamin B12 and will follow-up with [name] as directed to monitor her progress.  Obesity  Alicia Knapp is currently in the action stage of change. As such, her goal is to continue with weight loss efforts. She has agreed to follow the Category 2 plan. She will work on meal planning. We will review labs at her next visit. Alicia Knapp has been instructed to continue exercising at home for weight loss and overall health benefits. We discussed the following Behavioral Modification Strategies today: increasing lean protein intake,  decreasing simple carbohydrates, increasing vegetables, increase H20 intake, decrease eating out, no skipping meals, work on meal planning and easy cooking plans, keeping healthy foods in the home, and planning for success.  Alicia Knapp has agreed to follow-up with our clinic in 2 weeks. She was informed of the importance of frequent follow-up visits to maximize her success with intensive lifestyle modifications for her multiple health conditions.  ALLERGIES: Allergies  Allergen Reactions  . Erythromycin     Causes stomach cramps  . Penicillins     rash    MEDICATIONS: Current Outpatient Medications on File Prior to Visit  Medication Sig Dispense Refill  . amLODipine (NORVASC) 10 MG tablet TAKE 1 TABLET DAILY 90 tablet 4  . ARIPiprazole (ABILIFY) 2 MG tablet Take 2 mg by mouth daily.     . ARIPiprazole (ABILIFY) 5 MG tablet Take 1 tablet by mouth daily.  0  . atorvastatin (LIPITOR) 10 MG tablet TAKE 1 TABLET DAILY 90 tablet 3  . carvedilol (COREG) 6.25 MG tablet TAKE 1 TABLET TWICE A DAY WITH MEALS 180 tablet 1  . cholecalciferol (VITAMIN D3) 25 MCG (1000 UT) tablet Take 2,000 Units by mouth daily.    . fluticasone (FLONASE) 50 MCG/ACT nasal spray Place 2 sprays into both nostrils daily. 48 g 2  . hydrochlorothiazide (HYDRODIURIL) 25 MG tablet TAKE 1 TABLET DAILY 90 tablet 1  . levocetirizine (XYZAL) 5 MG tablet Take 1 tablet (5 mg total) by mouth every evening. 90 tablet 3  . losartan (COZAAR) 50 MG tablet TAKE 1 TABLET DAILY 90 tablet 1   No current facility-administered medications on file prior to visit.     PAST MEDICAL HISTORY: Past Medical History:  Diagnosis Date  . Anxiety   . B12 deficiency   . Constipation   . Heart murmur   . Hyperlipidemia   . Hypertension   . Joint pain   . Postoperative nausea   . Prediabetes   . Swelling    lower ext  . Vitamin D deficiency     PAST SURGICAL HISTORY: Past Surgical History:  Procedure Laterality Date  . ABDOMINAL HYSTERECTOMY     . APPENDECTOMY    . COLONOSCOPY  05/22/2015   Dr.Pyrtle  . POLYPECTOMY      SOCIAL HISTORY: Social History   Tobacco Use  . Smoking status: Former Smoker    Types: Cigarettes    Quit date: 09/17/2014    Years since quitting: 4.6  . Smokeless tobacco: Never Used  Substance Use Topics  . Alcohol use: No    Alcohol/week: 0.0 standard drinks  . Drug use: No    FAMILY HISTORY: Family History  Problem Relation Age of Onset  . Cancer Father        prostate  . Prostate cancer Father   . Diabetes Mother   . Thyroid disease Mother   . Hypertension Mother   . Hyperlipidemia Mother   . Kidney disease Mother   . Cancer Mother   . Sleep apnea Mother   . Obesity Mother   . Hypertension Sister   .  Hypertension Brother   . Stroke Brother   . Colon cancer Brother 43       died 63 - 4-5 months after dx  . Arthritis Maternal Grandmother   . Esophageal cancer Neg Hx   . Rectal cancer Neg Hx   . Stomach cancer Neg Hx    ROS: Review of Systems  Gastrointestinal: Negative for nausea and vomiting.  Musculoskeletal:       Negative for muscle weakness.  Endo/Heme/Allergies:       Negative for polyphagia.   PHYSICAL EXAM: Blood pressure 131/73, pulse 65, temperature 98.9 F (37.2 C), temperature source Oral, height 5\' 6"  (1.676 m), weight 179 lb (81.2 kg), SpO2 99 %. Body mass index is 28.89 kg/m. Physical Exam Vitals signs reviewed.  Constitutional:      Appearance: Normal appearance. She is obese.  Cardiovascular:     Rate and Rhythm: Normal rate.     Pulses: Normal pulses.  Pulmonary:     Effort: Pulmonary effort is normal.     Breath sounds: Normal breath sounds.  Musculoskeletal: Normal range of motion.  Skin:    General: Skin is warm and dry.  Neurological:     Mental Status: She is alert and oriented to person, place, and time.  Psychiatric:        Behavior: Behavior normal.   RECENT LABS AND TESTS: BMET    Component Value Date/Time   NA 143 04/08/2019  0826   K 4.7 04/08/2019 0826   CL 103 04/08/2019 0826   CO2 24 04/08/2019 0826   GLUCOSE 105 (H) 04/08/2019 0826   GLUCOSE 98 05/18/2018 1801   BUN 14 04/08/2019 0826   CREATININE 0.90 04/08/2019 0826   CALCIUM 10.0 04/08/2019 0826   GFRNONAA 69 04/08/2019 0826   GFRAA 80 04/08/2019 0826   Lab Results  Component Value Date   HGBA1C 5.4 04/08/2019   HGBA1C 5.5 11/09/2018   HGBA1C 5.7 05/18/2018   HGBA1C 5.6 12/21/2017   Lab Results  Component Value Date   INSULIN 20.3 04/08/2019   INSULIN 13.8 11/09/2018   INSULIN 15.4 08/03/2018   CBC    Component Value Date/Time   WBC 3.6 (L) 09/15/2017 1153   RBC 4.43 09/15/2017 1153   HGB 12.7 09/15/2017 1153   HCT 39.3 09/15/2017 1153   PLT 349.0 09/15/2017 1153   MCV 88.7 09/15/2017 1153   MCHC 32.3 09/15/2017 1153   RDW 14.3 09/15/2017 1153   LYMPHSABS 1.6 09/15/2017 1153   MONOABS 0.3 09/15/2017 1153   EOSABS 0.1 09/15/2017 1153   BASOSABS 0.0 09/15/2017 1153   Iron/TIBC/Ferritin/ %Sat No results found for: IRON, TIBC, FERRITIN, IRONPCTSAT Lipid Panel     Component Value Date/Time   CHOL 148 04/08/2019 0826   TRIG 67 04/08/2019 0826   HDL 48 04/08/2019 0826   CHOLHDL 3 05/18/2018 1801   VLDL 21.2 05/18/2018 1801   LDLCALC 87 04/08/2019 0826   LDLDIRECT 140.5 08/12/2013 0913   Hepatic Function Panel     Component Value Date/Time   PROT 7.7 04/08/2019 0826   ALBUMIN 4.4 04/08/2019 0826   AST 18 04/08/2019 0826   ALT 16 04/08/2019 0826   ALKPHOS 76 04/08/2019 0826   BILITOT 0.3 04/08/2019 0826   BILIDIR 0.0 02/22/2015 1206      Component Value Date/Time   TSH 2.530 04/08/2019 0826   TSH 1.760 08/03/2018 1324   TSH 1.81 09/15/2017 1153   Results for JALAINE, RIGGENBACH (MRN [id number]) as of 04/26/2019 11:16  Ref.  Range 04/08/2019 08:26  Vitamin D, 25-Hydroxy Latest Ref Range: 30.0 - 100.0 ng/mL 40.1    OBESITY BEHAVIORAL INTERVENTION VISIT  Today's visit was #17   Starting weight: 212 lbs Starting date:  08/03/2018 Today's weight: 179 lbs Today's date: 04/25/2019 Total lbs lost to date: 30  ASK: We discussed the diagnosis of obesity with Marton Redwood today and Carsyn agreed to give [name] permission to discuss obesity behavioral modification therapy today.  ASSESS: Libby has the diagnosis of obesity and her BMI today is 28.9. Soundra is in the action stage of change.   ADVISE: Aubrei was educated on the multiple health risks of obesity as well as the benefit of weight loss to improve her health. She was advised of the need for long term treatment and the importance of lifestyle modifications to improve her current health and to decrease her risk of future health problems.  AGREE: Multiple dietary modification options and treatment options were discussed and  Ziona agreed to follow the recommendations documented in the above note.  ARRANGE: Eyanna was educated on the importance of frequent visits to treat obesity as outlined per CMS and USPSTF guidelines and agreed to schedule her next follow up appointment today.  Migdalia Dk, am acting as Location manager for [employer], DO  I have reviewed the above documentation for accuracy and completeness, and I agree with the above. -Jearld Lesch, DO

## 2019-05-09 ENCOUNTER — Other Ambulatory Visit: Payer: Self-pay | Admitting: Family Medicine

## 2019-05-09 DIAGNOSIS — I10 Essential (primary) hypertension: Secondary | ICD-10-CM

## 2019-05-09 MED ORDER — CARVEDILOL 6.25 MG PO TABS: 6.2500 mg | ORAL_TABLET | Freq: Two times a day (BID) | ORAL | 1 refills | Status: DC

## 2019-05-09 NOTE — Telephone Encounter (Signed)
**   Requesting 90 day supply. Please advise** Medication Refill - Medication: carvedilol (COREG) 6.25 MG tablet   Has the patient contacted their pharmacy? Yes.   (Agent: If no, request that the patient contact the pharmacy for the refill.) (Agent: If yes, when and what did the pharmacy advise?)  Preferred Pharmacy (with phone number or street name): [place]  Agent: Please be advised that RX refills may take up to 3 business days. We ask that you follow-up with your pharmacy.

## 2019-05-09 NOTE — Telephone Encounter (Signed)
Rx sent 

## 2019-05-11 ENCOUNTER — Ambulatory Visit (INDEPENDENT_AMBULATORY_CARE_PROVIDER_SITE_OTHER): Admitting: Bariatrics

## 2019-05-11 ENCOUNTER — Encounter (INDEPENDENT_AMBULATORY_CARE_PROVIDER_SITE_OTHER): Payer: Self-pay | Admitting: Bariatrics

## 2019-05-11 ENCOUNTER — Other Ambulatory Visit: Payer: Self-pay

## 2019-05-11 VITALS — BP 146/76 | HR 60 | Temp 98.6°F | Ht 66.0 in | Wt 181.0 lb

## 2019-05-11 DIAGNOSIS — E669 Obesity, unspecified: Secondary | ICD-10-CM

## 2019-05-11 DIAGNOSIS — Z9189 Other specified personal risk factors, not elsewhere classified: Secondary | ICD-10-CM

## 2019-05-11 DIAGNOSIS — E88819 Insulin resistance, unspecified: Secondary | ICD-10-CM

## 2019-05-11 DIAGNOSIS — E559 Vitamin D deficiency, unspecified: Secondary | ICD-10-CM | POA: Diagnosis not present

## 2019-05-11 DIAGNOSIS — K5909 Other constipation: Secondary | ICD-10-CM | POA: Diagnosis not present

## 2019-05-11 DIAGNOSIS — Z6830 Body mass index (BMI) 30.0-30.9, adult: Secondary | ICD-10-CM

## 2019-05-11 DIAGNOSIS — E8881 Metabolic syndrome: Secondary | ICD-10-CM | POA: Diagnosis not present

## 2019-05-11 NOTE — Progress Notes (Signed)
Office: [phone]  /  Fax: [phone]   HPI:   Chief Complaint: OBESITY Alicia Knapp is here to discuss her progress with her obesity treatment plan. She is on the Category 2 plan and is following her eating plan approximately 20 to 30 % of the time. She states she is doing weights 30 minutes 2 times per week and she is doing cardio exercise 60 minutes 3 times per week. Alicia Knapp has gained 2 pounds, but she is doing well overall. Her weight is 181 lb (82.1 kg) today and has had a weight gain of 2 pounds over a period of 2 weeks since her last visit. She has lost 31 lbs since starting treatment with [name].  Insulin Resistance Alicia Knapp has a diagnosis of insulin resistance based on her elevated fasting insulin level >5. Although Alicia Knapp's blood glucose readings are still under good control, insulin resistance puts her at greater risk of metabolic syndrome and diabetes. Her last A1c was at 5.4 and last insulin level was at 20.3 She is taking metformin currently and continues to work on diet and exercise to decrease risk of diabetes.  At risk for diabetes Alicia Knapp is at higher than average risk for developing diabetes due to her obesity and insulin resistance. She currently denies polyuria or polydipsia.  Vitamin D deficiency Alicia Knapp has a diagnosis of vitamin D deficiency. Her last vitamin D level was at 40.1 She is currently taking OTC vit D and denies nausea, vomiting or muscle weakness.  Constipation Alicia Knapp notes constipation for the last few weeks, worse since attempting weight loss. She has no stool retention.  ASSESSMENT AND PLAN:  Insulin resistance  Vitamin D deficiency  Other constipation  At risk for diabetes mellitus  Class 1 obesity with serious comorbidity and body mass index (BMI) of 30.0 to 30.9 in adult, unspecified obesity type - BMI greater than 30 at start of program  PLAN:  Insulin Resistance Alicia Knapp will continue to work on weight loss, exercise, increasing lean protein and decreasing  simple carbohydrates in her diet to help decrease the risk of diabetes. We dicussed metformin including benefits and risks. She was informed that eating too many simple carbohydrates or too many calories at one sitting increases the likelihood of GI side effects. Alicia Knapp will continue metformin for now and prescription was not written today. Alicia Knapp agreed to follow up with [name] as directed to monitor her progress.  Diabetes risk counseling Alicia Knapp was given extended (15 minutes) diabetes prevention counseling today. She is 63 y.o. female and has risk factors for diabetes including obesity and insulin resistance. We discussed intensive lifestyle modifications today with an emphasis on weight loss as well as increasing exercise and decreasing simple carbohydrates in her diet.  Vitamin D Deficiency Alicia Knapp was informed that low vitamin D levels contributes to fatigue and are associated with obesity, breast, and colon cancer. She will continue to take OTC Vit D and will follow up for routine testing of vitamin D, at least 61-3 times per year. She was informed of the risk of over-replacement of vitamin D and agrees to not increase her dose unless she discusses this with [name] first.  Constipation Alicia Knapp was informed decrease bowel movement frequency is normal while losing weight, but stools should not be hard or painful. She was advised to increase her H20 intake and raw vegetables. She will take Flaxseed (or wheat germ) and she will use Miralax with water. [name] agrees to follow up with our clinic in 4 weeks.  Obesity Alicia Knapp is  currently in the action stage of change. As such, her goal is to continue with weight loss efforts She has agreed to follow the Category 2 plan Alicia Knapp will continue her exercise regimen for weight loss and overall health benefits. We discussed the following Behavioral Modification Strategies today: planning for success, increase H2O intake, no skipping meals, keeping healthy foods in the home, increasing  lean protein intake, decreasing simple carbohydrates, increasing vegetables, decrease eating out and work on meal planning and intentional eating Get a goal weight (critical weight).  Alicia Knapp has agreed to follow up with our clinic in 4 weeks. She was informed of the importance of frequent follow up visits to maximize her success with intensive lifestyle modifications for her multiple health conditions.  ALLERGIES: Allergies  Allergen Reactions  . Erythromycin     Causes stomach cramps  . Penicillins     rash    MEDICATIONS: Current Outpatient Medications on File Prior to Visit  Medication Sig Dispense Refill  . amLODipine (NORVASC) 10 MG tablet TAKE 1 TABLET DAILY 90 tablet 4  . ARIPiprazole (ABILIFY) 2 MG tablet Take 2 mg by mouth daily.     . ARIPiprazole (ABILIFY) 5 MG tablet Take 1 tablet by mouth daily.  0  . atorvastatin (LIPITOR) 10 MG tablet TAKE 1 TABLET DAILY 90 tablet 3  . carvedilol (COREG) 6.25 MG tablet Take 1 tablet (6.25 mg total) by mouth 2 (two) times daily with a meal. 180 tablet 1  . cholecalciferol (VITAMIN D3) 25 MCG (1000 UT) tablet Take 2,000 Units by mouth daily.    . fluticasone (FLONASE) 50 MCG/ACT nasal spray Place 2 sprays into both nostrils daily. 48 g 2  . hydrochlorothiazide (HYDRODIURIL) 25 MG tablet TAKE 1 TABLET DAILY 90 tablet 1  . levocetirizine (XYZAL) 5 MG tablet Take 1 tablet (5 mg total) by mouth every evening. 90 tablet 3  . losartan (COZAAR) 50 MG tablet TAKE 1 TABLET DAILY 90 tablet 1  . metFORMIN (GLUCOPHAGE) 500 MG tablet Take 1 tablet (500 mg total) by mouth daily. 30 tablet 0   No current facility-administered medications on file prior to visit.     PAST MEDICAL HISTORY: Past Medical History:  Diagnosis Date  . Anxiety   . B12 deficiency   . Constipation   . Heart murmur   . Hyperlipidemia   . Hypertension   . Joint pain   . Postoperative nausea   . Prediabetes   . Swelling    lower ext  . Vitamin D deficiency     PAST  SURGICAL HISTORY: Past Surgical History:  Procedure Laterality Date  . ABDOMINAL HYSTERECTOMY    . APPENDECTOMY    . COLONOSCOPY  05/22/2015   Dr.Pyrtle  . POLYPECTOMY      SOCIAL HISTORY: Social History   Tobacco Use  . Smoking status: Former Smoker    Types: Cigarettes    Quit date: 09/17/2014    Years since quitting: 4.6  . Smokeless tobacco: Never Used  Substance Use Topics  . Alcohol use: No    Alcohol/week: 0.0 standard drinks  . Drug use: No    FAMILY HISTORY: Family History  Problem Relation Age of Onset  . Cancer Father        prostate  . Prostate cancer Father   . Diabetes Mother   . Thyroid disease Mother   . Hypertension Mother   . Hyperlipidemia Mother   . Kidney disease Mother   . Cancer Mother   . Sleep apnea  Mother   . Obesity Mother   . Hypertension Sister   . Hypertension Brother   . Stroke Brother   . Colon cancer Brother 17       died 71 - 4-5 months after dx  . Arthritis Maternal Grandmother   . Esophageal cancer Neg Hx   . Rectal cancer Neg Hx   . Stomach cancer Neg Hx     ROS: Review of Systems  Constitutional: Negative for weight loss.  Gastrointestinal: Positive for constipation. Negative for nausea and vomiting.  Genitourinary: Negative for frequency.  Musculoskeletal:       Negative for muscle weakness  Endo/Heme/Allergies: Negative for polydipsia.    PHYSICAL EXAM: Blood pressure (!) 146/76, pulse 60, temperature 98.6 F (37 C), temperature source Oral, height 5\' 6"  (1.676 m), weight 181 lb (82.1 kg), SpO2 100 %. Body mass index is 29.21 kg/m. Physical Exam Vitals signs reviewed.  Constitutional:      Appearance: Normal appearance. She is well-developed. She is obese.  Cardiovascular:     Rate and Rhythm: Normal rate.  Pulmonary:     Effort: Pulmonary effort is normal.  Musculoskeletal: Normal range of motion.  Skin:    General: Skin is warm and dry.  Neurological:     Mental Status: She is alert and oriented to  person, place, and time.  Psychiatric:        Mood and Affect: Mood normal.        Behavior: Behavior normal.     RECENT LABS AND TESTS: BMET    Component Value Date/Time   NA 143 04/08/2019 0826   K 4.7 04/08/2019 0826   CL 103 04/08/2019 0826   CO2 24 04/08/2019 0826   GLUCOSE 105 (H) 04/08/2019 0826   GLUCOSE 98 05/18/2018 1801   BUN 14 04/08/2019 0826   CREATININE 0.90 04/08/2019 0826   CALCIUM 10.0 04/08/2019 0826   GFRNONAA 69 04/08/2019 0826   GFRAA 80 04/08/2019 0826   Lab Results  Component Value Date   HGBA1C 5.4 04/08/2019   HGBA1C 5.5 11/09/2018   HGBA1C 5.7 05/18/2018   HGBA1C 5.6 12/21/2017   Lab Results  Component Value Date   INSULIN 20.3 04/08/2019   INSULIN 13.8 11/09/2018   INSULIN 15.4 08/03/2018   CBC    Component Value Date/Time   WBC 3.6 (L) 09/15/2017 1153   RBC 4.43 09/15/2017 1153   HGB 12.7 09/15/2017 1153   HCT 39.3 09/15/2017 1153   PLT 349.0 09/15/2017 1153   MCV 88.7 09/15/2017 1153   MCHC 32.3 09/15/2017 1153   RDW 14.3 09/15/2017 1153   LYMPHSABS 1.6 09/15/2017 1153   MONOABS 0.3 09/15/2017 1153   EOSABS 0.1 09/15/2017 1153   BASOSABS 0.0 09/15/2017 1153   Iron/TIBC/Ferritin/ %Sat No results found for: IRON, TIBC, FERRITIN, IRONPCTSAT Lipid Panel     Component Value Date/Time   CHOL 148 04/08/2019 0826   TRIG 67 04/08/2019 0826   HDL 48 04/08/2019 0826   CHOLHDL 3 05/18/2018 1801   VLDL 21.2 05/18/2018 1801   LDLCALC 87 04/08/2019 0826   LDLDIRECT 140.5 08/12/2013 0913   Hepatic Function Panel     Component Value Date/Time   PROT 7.7 04/08/2019 0826   ALBUMIN 4.4 04/08/2019 0826   AST 18 04/08/2019 0826   ALT 16 04/08/2019 0826   ALKPHOS 76 04/08/2019 0826   BILITOT 0.3 04/08/2019 0826   BILIDIR 0.0 02/22/2015 1206      Component Value Date/Time   TSH 2.530 04/08/2019 2035  TSH 1.760 08/03/2018 1324   TSH 1.81 09/15/2017 1153      OBESITY BEHAVIORAL INTERVENTION VISIT  Today's visit was # 18    Starting weight: 212 lbs Starting date: 08/03/2018 Today's weight : 181 lbs Today's date: 05/11/2019 Total lbs lost to date: 31    05/11/2019  Height 5\' 6"  (1.676 m)  Weight 181 lb (82.1 kg)  BMI (Calculated) 29.23  BLOOD PRESSURE - SYSTOLIC 660  BLOOD PRESSURE - DIASTOLIC 76   Body Fat % 60.0 %  Total Body Water (lbs) 68.6 lbs    ASK: We discussed the diagnosis of obesity with Alicia Knapp today and Alicia Knapp agreed to give [name] permission to discuss obesity behavioral modification therapy today.  ASSESS: Taneasha has the diagnosis of obesity and her BMI today is 29.23 [name] is in the action stage of change   ADVISE: Adaijah was educated on the multiple health risks of obesity as well as the benefit of weight loss to improve her health. She was advised of the need for long term treatment and the importance of lifestyle modifications to improve her current health and to decrease her risk of future health problems.  AGREE: Multiple dietary modification options and treatment options were discussed and  Henretter agreed to follow the recommendations documented in the above note.  ARRANGE: Kingslee was educated on the importance of frequent visits to treat obesity as outlined per CMS and USPSTF guidelines and agreed to schedule her next follow up appointment today.  Corey Skains, am acting as Location manager for [employer]. Owens Shark, DO  I have reviewed the above documentation for accuracy and completeness, and I agree with the above. -Alicia Lesch, DO

## 2019-06-08 ENCOUNTER — Ambulatory Visit (INDEPENDENT_AMBULATORY_CARE_PROVIDER_SITE_OTHER): Admitting: Bariatrics

## 2019-06-08 ENCOUNTER — Encounter (INDEPENDENT_AMBULATORY_CARE_PROVIDER_SITE_OTHER): Payer: Self-pay | Admitting: Bariatrics

## 2019-06-08 ENCOUNTER — Other Ambulatory Visit: Payer: Self-pay

## 2019-06-08 VITALS — BP 128/62 | HR 63 | Temp 98.9°F

## 2019-06-08 DIAGNOSIS — E88819 Insulin resistance, unspecified: Secondary | ICD-10-CM

## 2019-06-08 DIAGNOSIS — E559 Vitamin D deficiency, unspecified: Secondary | ICD-10-CM | POA: Diagnosis not present

## 2019-06-08 DIAGNOSIS — E8881 Metabolic syndrome: Secondary | ICD-10-CM | POA: Diagnosis not present

## 2019-06-08 DIAGNOSIS — E669 Obesity, unspecified: Secondary | ICD-10-CM

## 2019-06-08 DIAGNOSIS — Z9189 Other specified personal risk factors, not elsewhere classified: Secondary | ICD-10-CM

## 2019-06-08 DIAGNOSIS — Z6830 Body mass index (BMI) 30.0-30.9, adult: Secondary | ICD-10-CM

## 2019-06-08 MED ORDER — METFORMIN HCL 500 MG PO TABS: 500.0000 mg | ORAL_TABLET | Freq: Two times a day (BID) | ORAL | 0 refills | Status: DC

## 2019-06-08 NOTE — Progress Notes (Signed)
Office: [phone]  /  Fax: [phone]   HPI:   Chief Complaint: OBESITY Alicia Knapp is here to discuss her progress with her obesity treatment plan. She is on the Category 2 plan and is following her eating plan approximately 75% of the time. She states she is doing weights 30 minutes 2 times per week and doing cardio 45 minutes 2-3 times per week. Alicia Knapp is down 2 lbs. She has struggled with snacking. Her weight is (P) 179 lb (81.2 kg) today and has had a weight loss of 2 pounds over a period of 4 weeks since her last visit. She has lost 33 lbs since starting treatment with [name].  Insulin Resistance Alicia Knapp has a diagnosis of insulin resistance based on her elevated fasting insulin level >5. Her last A1c was 5.4 on 04/08/2019 and insulin 20.3. Although Alicia Knapp's blood glucose readings are still under good control, insulin resistance puts her at greater risk of metabolic syndrome and diabetes. She is taking metformin currently and continues to work on diet and exercise to decrease risk of diabetes. No polyphagia.  At risk for diabetes Alicia Knapp is at higher than average risk for developing diabetes due to her obesity. She currently denies polyuria or polydipsia.  Vitamin D deficiency Alicia Knapp has a diagnosis of Vitamin D deficiency. Her last Vitamin D was 40.1 on 04/08/2019. She is currently taking Vit D and denies nausea, vomiting or muscle weakness.  ASSESSMENT AND PLAN:  Insulin resistance - Plan: metFORMIN (GLUCOPHAGE) 500 MG tablet  Vitamin D deficiency  At risk for diabetes mellitus  Class 1 obesity with serious comorbidity and body mass index (BMI) of 30.0 to 30.9 in adult, unspecified obesity type - BMI greater than 30 at start of program   PLAN:  Insulin Resistance Alicia Knapp will continue to work on weight loss, exercise, and decreasing simple carbohydrates in her diet to help decrease the risk of diabetes. We dicussed metformin including benefits and risks. She was informed that eating too many  simple carbohydrates or too many calories at one sitting increases the likelihood of GI side effects. [name] was given a prescription for metformin 500 mg 1 BID at breakfast and noon #60 with 0 refills. She agrees to follow-up with our clinic in 2 weeks.  Diabetes risk counseling Alicia Knapp was given extended (15 minutes) diabetes prevention counseling today. She is 63 y.o. female and has risk factors for diabetes including obesity. We discussed intensive lifestyle modifications today with an emphasis on weight loss as well as increasing exercise and decreasing simple carbohydrates in her diet.  Vitamin D Deficiency Alicia Knapp was informed that low Vitamin D levels contributes to fatigue and are associated with obesity, breast, and colon cancer. She agrees to continue taking Vit D and will follow-up for routine testing of Vitamin D, at least 2-3 times per year. She was informed of the risk of over-replacement of Vitamin D and agrees to not increase her dose unless she discusses this with [name] first. Alicia Knapp agrees to follow-up with our clinic in 2 weeks.  Obesity Alicia Knapp is currently in the action stage of change. As such, her goal is to continue with weight loss efforts. She has agreed to follow the Category 2 plan. Alicia Knapp will work on meal planning and intentional eating. Alicia Knapp has been instructed to continue her current exercise regimen for weight loss and overall health benefits. We discussed the following Behavioral Modification Strategies today: increasing lean protein intake, decreasing simple carbohydrates, increasing vegetables, increase H20 intake, decrease eating out, no skipping  meals, work on meal planning and easy cooking plans, and keeping healthy foods in the home.  Alicia Knapp has agreed to follow-up with our clinic in 2 weeks. She was informed of the importance of frequent follow-up visits to maximize her success with intensive lifestyle modifications for her multiple health conditions.  ALLERGIES: Allergies   Allergen Reactions  . Erythromycin     Causes stomach cramps  . Penicillins     rash    MEDICATIONS: Current Outpatient Medications on File Prior to Visit  Medication Sig Dispense Refill  . amLODipine (NORVASC) 10 MG tablet TAKE 1 TABLET DAILY 90 tablet 4  . ARIPiprazole (ABILIFY) 2 MG tablet Take 2 mg by mouth daily.     . ARIPiprazole (ABILIFY) 5 MG tablet Take 1 tablet by mouth daily.  0  . atorvastatin (LIPITOR) 10 MG tablet TAKE 1 TABLET DAILY 90 tablet 3  . carvedilol (COREG) 6.25 MG tablet Take 1 tablet (6.25 mg total) by mouth 2 (two) times daily with a meal. 180 tablet 1  . cholecalciferol (VITAMIN D3) 25 MCG (1000 UT) tablet Take 2,000 Units by mouth daily.    . fluticasone (FLONASE) 50 MCG/ACT nasal spray Place 2 sprays into both nostrils daily. 48 g 2  . hydrochlorothiazide (HYDRODIURIL) 25 MG tablet TAKE 1 TABLET DAILY 90 tablet 1  . levocetirizine (XYZAL) 5 MG tablet Take 1 tablet (5 mg total) by mouth every evening. 90 tablet 3  . losartan (COZAAR) 50 MG tablet TAKE 1 TABLET DAILY 90 tablet 1   No current facility-administered medications on file prior to visit.     PAST MEDICAL HISTORY: Past Medical History:  Diagnosis Date  . Anxiety   . B12 deficiency   . Constipation   . Heart murmur   . Hyperlipidemia   . Hypertension   . Joint pain   . Postoperative nausea   . Prediabetes   . Swelling    lower ext  . Vitamin D deficiency     PAST SURGICAL HISTORY: Past Surgical History:  Procedure Laterality Date  . ABDOMINAL HYSTERECTOMY    . APPENDECTOMY    . COLONOSCOPY  05/22/2015   Dr.Pyrtle  . POLYPECTOMY      SOCIAL HISTORY: Social History   Tobacco Use  . Smoking status: Former Smoker    Types: Cigarettes    Quit date: 09/17/2014    Years since quitting: 4.7  . Smokeless tobacco: Never Used  Substance Use Topics  . Alcohol use: No    Alcohol/week: 0.0 standard drinks  . Drug use: No    FAMILY HISTORY: Family History  Problem Relation  Age of Onset  . Cancer Father        prostate  . Prostate cancer Father   . Diabetes Mother   . Thyroid disease Mother   . Hypertension Mother   . Hyperlipidemia Mother   . Kidney disease Mother   . Cancer Mother   . Sleep apnea Mother   . Obesity Mother   . Hypertension Sister   . Hypertension Brother   . Stroke Brother   . Colon cancer Brother 78       died 11 - 4-5 months after dx  . Arthritis Maternal Grandmother   . Esophageal cancer Neg Hx   . Rectal cancer Neg Hx   . Stomach cancer Neg Hx    ROS: Review of Systems  Gastrointestinal: Negative for nausea and vomiting.  Musculoskeletal:       Negative for muscle weakness.  Endo/Heme/Allergies:       Negative for polyphagia.   PHYSICAL EXAM: Blood pressure 128/62, pulse 63, temperature 98.9 F (37.2 C), temperature source Oral, height (P) 5\' 6"  (1.676 m), weight (P) 179 lb (81.2 kg), SpO2 100 %. Body mass index is 28.89 kg/m (pended). Physical Exam Vitals signs reviewed.  Constitutional:      Appearance: Normal appearance. She is obese.  Cardiovascular:     Rate and Rhythm: Normal rate.     Pulses: Normal pulses.  Pulmonary:     Effort: Pulmonary effort is normal.     Breath sounds: Normal breath sounds.  Musculoskeletal: Normal range of motion.  Skin:    General: Skin is warm and dry.  Neurological:     Mental Status: She is alert and oriented to person, place, and time.  Psychiatric:        Behavior: Behavior normal.   RECENT LABS AND TESTS: BMET    Component Value Date/Time   NA 143 04/08/2019 0826   K 4.7 04/08/2019 0826   CL 103 04/08/2019 0826   CO2 24 04/08/2019 0826   GLUCOSE 105 (H) 04/08/2019 0826   GLUCOSE 98 05/18/2018 1801   BUN 14 04/08/2019 0826   CREATININE 0.90 04/08/2019 0826   CALCIUM 10.0 04/08/2019 0826   GFRNONAA 69 04/08/2019 0826   GFRAA 80 04/08/2019 0826   Lab Results  Component Value Date   HGBA1C 5.4 04/08/2019   HGBA1C 5.5 11/09/2018   HGBA1C 5.7 05/18/2018    HGBA1C 5.6 12/21/2017   Lab Results  Component Value Date   INSULIN 20.3 04/08/2019   INSULIN 13.8 11/09/2018   INSULIN 15.4 08/03/2018   CBC    Component Value Date/Time   WBC 3.6 (L) 09/15/2017 1153   RBC 4.43 09/15/2017 1153   HGB 12.7 09/15/2017 1153   HCT 39.3 09/15/2017 1153   PLT 349.0 09/15/2017 1153   MCV 88.7 09/15/2017 1153   MCHC 32.3 09/15/2017 1153   RDW 14.3 09/15/2017 1153   LYMPHSABS 1.6 09/15/2017 1153   MONOABS 0.3 09/15/2017 1153   EOSABS 0.1 09/15/2017 1153   BASOSABS 0.0 09/15/2017 1153   Iron/TIBC/Ferritin/ %Sat No results found for: IRON, TIBC, FERRITIN, IRONPCTSAT Lipid Panel     Component Value Date/Time   CHOL 148 04/08/2019 0826   TRIG 67 04/08/2019 0826   HDL 48 04/08/2019 0826   CHOLHDL 3 05/18/2018 1801   VLDL 21.2 05/18/2018 1801   LDLCALC 87 04/08/2019 0826   LDLDIRECT 140.5 08/12/2013 0913   Hepatic Function Panel     Component Value Date/Time   PROT 7.7 04/08/2019 0826   ALBUMIN 4.4 04/08/2019 0826   AST 18 04/08/2019 0826   ALT 16 04/08/2019 0826   ALKPHOS 76 04/08/2019 0826   BILITOT 0.3 04/08/2019 0826   BILIDIR 0.0 02/22/2015 1206      Component Value Date/Time   TSH 2.530 04/08/2019 0826   TSH 1.760 08/03/2018 1324   TSH 1.81 09/15/2017 1153   Results for LANAIYA, LANTRY (MRN [id number]) as of 06/08/2019 15:36  Ref. Range 04/08/2019 08:26  Vitamin D, 25-Hydroxy Latest Ref Range: 30.0 - 100.0 ng/mL 40.1   OBESITY BEHAVIORAL INTERVENTION VISIT  Today's visit was #19  Starting weight: 212 lbs Starting date: 08/03/2018 Today's weight: 179 lbs  Today's date: 06/08/2019 Total lbs lost to date: 33    06/08/2019  BLOOD PRESSURE - SYSTOLIC 742  BLOOD PRESSURE - DIASTOLIC 62   ASK: We discussed the diagnosis of obesity with Marton Redwood today  and Maeghan agreed to give [name] permission to discuss obesity behavioral modification therapy today.  ASSESS: Sheridyn has the diagnosis of obesity and her BMI today is 29.0. Ingris is in the  action stage of change.   ADVISE: Miangel was educated on the multiple health risks of obesity as well as the benefit of weight loss to improve her health. She was advised of the need for long term treatment and the importance of lifestyle modifications to improve her current health and to decrease her risk of future health problems.  AGREE: Multiple dietary modification options and treatment options were discussed and  Nakaya agreed to follow the recommendations documented in the above note.  ARRANGE: Arma was educated on the importance of frequent visits to treat obesity as outlined per CMS and USPSTF guidelines and agreed to schedule her next follow up appointment today.  Migdalia Dk, am acting as Location manager for [employer], DO  I have reviewed the above documentation for accuracy and completeness, and I agree with the above. -Jearld Lesch, DO

## 2019-06-15 ENCOUNTER — Other Ambulatory Visit: Payer: Self-pay | Admitting: Family Medicine

## 2019-06-15 DIAGNOSIS — E785 Hyperlipidemia, unspecified: Secondary | ICD-10-CM

## 2019-06-17 ENCOUNTER — Other Ambulatory Visit: Payer: Self-pay | Admitting: Family Medicine

## 2019-06-17 DIAGNOSIS — J302 Other seasonal allergic rhinitis: Secondary | ICD-10-CM

## 2019-06-22 ENCOUNTER — Ambulatory Visit (INDEPENDENT_AMBULATORY_CARE_PROVIDER_SITE_OTHER): Admitting: Bariatrics

## 2019-06-22 ENCOUNTER — Encounter (INDEPENDENT_AMBULATORY_CARE_PROVIDER_SITE_OTHER): Payer: Self-pay | Admitting: Bariatrics

## 2019-06-22 ENCOUNTER — Other Ambulatory Visit: Payer: Self-pay

## 2019-06-22 VITALS — BP 124/71 | HR 57 | Temp 98.8°F | Ht 66.0 in | Wt 179.0 lb

## 2019-06-22 DIAGNOSIS — I10 Essential (primary) hypertension: Secondary | ICD-10-CM

## 2019-06-22 DIAGNOSIS — Z6830 Body mass index (BMI) 30.0-30.9, adult: Secondary | ICD-10-CM

## 2019-06-22 DIAGNOSIS — E669 Obesity, unspecified: Secondary | ICD-10-CM | POA: Diagnosis not present

## 2019-06-22 DIAGNOSIS — E8881 Metabolic syndrome: Secondary | ICD-10-CM

## 2019-06-27 NOTE — Progress Notes (Signed)
Office: [phone]  /  Fax: [phone]   HPI:   Chief Complaint: OBESITY Alicia Knapp is here to discuss her progress with her obesity treatment plan. She is on the Category 2 plan and is following her eating plan approximately 80 % of the time. She states she is doing weights 30 minutes 2 times per week and cardio exercise 50 minutes 3 times per week. Alicia Knapp's weight remains the same, but she has done well overall.  Her weight is 179 lb (81.2 kg) today and she has maintained weight since her last visit. She has lost 33 lbs since starting treatment with [name].  Insulin Resistance Alicia Knapp has a diagnosis of insulin resistance based on her elevated fasting insulin level >5. Although Alicia Knapp's blood glucose readings are still under good control, insulin resistance puts her at greater risk of metabolic syndrome and diabetes. Her last A1c was at 5.4 and last insulin level was at 20.3 She is taking metformin currently and continues to work on diet and exercise to decrease risk of diabetes.  Hypertension Alicia Knapp is a 63 y.o. female with hypertension. She is taking Cozaar and Coreg. Alicia Knapp denies chest pain or shortness of breath on exertion. She is working weight loss to help control her blood pressure with the goal of decreasing her risk of heart attack and stroke. Alicia Knapp blood pressure is well controlled.  ASSESSMENT AND PLAN:  Insulin resistance  Essential hypertension  Class 1 obesity with serious comorbidity and body mass index (BMI) of 30.0 to 30.9 in adult, unspecified obesity type  PLAN:  Insulin Resistance Alicia Knapp will continue to work on weight loss, exercise, and decreasing simple carbohydrates in her diet to help decrease the risk of diabetes. We dicussed metformin including benefits and risks. She was informed that eating too many simple carbohydrates or too many calories at one sitting increases the likelihood of GI side effects. Alicia Knapp will continue taking metformin for now and  prescription was not written today. Alicia Knapp agreed to follow up with [name] as directed to monitor her progress.  Hypertension We discussed sodium restriction, working on healthy weight loss, and a regular exercise program as the means to achieve improved blood pressure control. Alicia Knapp agreed with this plan and agreed to follow up as directed. We will continue to monitor her blood pressure as well as her progress with the above lifestyle modifications. She will continue her medications as prescribed and will watch for signs of hypotension as she continues her lifestyle modifications.  I spent > than 50% of the 15 minute visit on counseling as documented in the note.  Obesity Alicia Knapp is currently in the action stage of change. As such, her goal is to continue with weight loss efforts She has agreed to follow the Category 2 plan Alicia Knapp has been instructed to work up to a goal of 150 minutes of combined cardio and strengthening exercise per week for weight loss and overall health benefits. We discussed the following Behavioral Modification Strategies today: planning for success, increase H2O intake, no skipping meals, keeping healthy foods in the home, increasing lean protein intake, decreasing simple carbohydrates, increasing vegetables, decrease eating out and work on meal planning and intentional eating  Alicia Knapp has agreed to follow up with our clinic in 2 weeks. She was informed of the importance of frequent follow up visits to maximize her success with intensive lifestyle modifications for her multiple health conditions.  ALLERGIES: Allergies  Allergen Reactions  . Erythromycin     Causes stomach cramps  .  Penicillins     rash    MEDICATIONS: Current Outpatient Medications on File Prior to Visit  Medication Sig Dispense Refill  . amLODipine (NORVASC) 10 MG tablet TAKE 1 TABLET DAILY 90 tablet 4  . ARIPiprazole (ABILIFY) 2 MG tablet Take 2 mg by mouth daily.     . ARIPiprazole (ABILIFY) 5 MG tablet  Take 1 tablet by mouth daily.  0  . atorvastatin (LIPITOR) 10 MG tablet TAKE 1 TABLET DAILY 90 tablet 3  . carvedilol (COREG) 6.25 MG tablet Take 1 tablet (6.25 mg total) by mouth 2 (two) times daily with a meal. 180 tablet 1  . cholecalciferol (VITAMIN D3) 25 MCG (1000 UT) tablet Take 2,000 Units by mouth daily.    . fluticasone (FLONASE) 50 MCG/ACT nasal spray USE 2 SPRAYS IN EACH NOSTRIL DAILY 48 g 3  . hydrochlorothiazide (HYDRODIURIL) 25 MG tablet TAKE 1 TABLET DAILY 90 tablet 1  . levocetirizine (XYZAL) 5 MG tablet Take 1 tablet (5 mg total) by mouth every evening. 90 tablet 3  . losartan (COZAAR) 50 MG tablet TAKE 1 TABLET DAILY 90 tablet 1  . metFORMIN (GLUCOPHAGE) 500 MG tablet Take 1 tablet (500 mg total) by mouth 2 (two) times daily with a meal. 60 tablet 0   No current facility-administered medications on file prior to visit.     PAST MEDICAL HISTORY: Past Medical History:  Diagnosis Date  . Anxiety   . B12 deficiency   . Constipation   . Heart murmur   . Hyperlipidemia   . Hypertension   . Joint pain   . Postoperative nausea   . Prediabetes   . Swelling    lower ext  . Vitamin D deficiency     PAST SURGICAL HISTORY: Past Surgical History:  Procedure Laterality Date  . ABDOMINAL HYSTERECTOMY    . APPENDECTOMY    . COLONOSCOPY  05/22/2015   Dr.Pyrtle  . POLYPECTOMY      SOCIAL HISTORY: Social History   Tobacco Use  . Smoking status: Former Smoker    Types: Cigarettes    Quit date: 09/17/2014    Years since quitting: 4.7  . Smokeless tobacco: Never Used  Substance Use Topics  . Alcohol use: No    Alcohol/week: 0.0 standard drinks  . Drug use: No    FAMILY HISTORY: Family History  Problem Relation Age of Onset  . Cancer Father        prostate  . Prostate cancer Father   . Diabetes Mother   . Thyroid disease Mother   . Hypertension Mother   . Hyperlipidemia Mother   . Kidney disease Mother   . Cancer Mother   . Sleep apnea Mother   . Obesity  Mother   . Hypertension Sister   . Hypertension Brother   . Stroke Brother   . Colon cancer Brother 62       died 81 - 4-5 months after dx  . Arthritis Maternal Grandmother   . Esophageal cancer Neg Hx   . Rectal cancer Neg Hx   . Stomach cancer Neg Hx     ROS: Review of Systems  Constitutional: Negative for weight loss.  Respiratory: Negative for shortness of breath (on exertion).   Cardiovascular: Negative for chest pain.    PHYSICAL EXAM: Blood pressure 124/71, pulse (!) 57, temperature 98.8 F (37.1 C), temperature source Oral, height 5\' 6"  (1.676 m), weight 179 lb (81.2 kg), SpO2 99 %. Body mass index is 28.89 kg/m. Physical Exam Vitals  signs reviewed.  Constitutional:      Appearance: Normal appearance. She is well-developed. She is obese.  Cardiovascular:     Rate and Rhythm: Normal rate.  Pulmonary:     Effort: Pulmonary effort is normal.  Musculoskeletal: Normal range of motion.  Skin:    General: Skin is warm and dry.  Neurological:     Mental Status: She is alert and oriented to person, place, and time.  Psychiatric:        Mood and Affect: Mood normal.        Behavior: Behavior normal.     RECENT LABS AND TESTS: BMET    Component Value Date/Time   NA 143 04/08/2019 0826   K 4.7 04/08/2019 0826   CL 103 04/08/2019 0826   CO2 24 04/08/2019 0826   GLUCOSE 105 (H) 04/08/2019 0826   GLUCOSE 98 05/18/2018 1801   BUN 14 04/08/2019 0826   CREATININE 0.90 04/08/2019 0826   CALCIUM 10.0 04/08/2019 0826   GFRNONAA 69 04/08/2019 0826   GFRAA 80 04/08/2019 0826   Lab Results  Component Value Date   HGBA1C 5.4 04/08/2019   HGBA1C 5.5 11/09/2018   HGBA1C 5.7 05/18/2018   HGBA1C 5.6 12/21/2017   Lab Results  Component Value Date   INSULIN 20.3 04/08/2019   INSULIN 13.8 11/09/2018   INSULIN 15.4 08/03/2018   CBC    Component Value Date/Time   WBC 3.6 (L) 09/15/2017 1153   RBC 4.43 09/15/2017 1153   HGB 12.7 09/15/2017 1153   HCT 39.3 09/15/2017  1153   PLT 349.0 09/15/2017 1153   MCV 88.7 09/15/2017 1153   MCHC 32.3 09/15/2017 1153   RDW 14.3 09/15/2017 1153   LYMPHSABS 1.6 09/15/2017 1153   MONOABS 0.3 09/15/2017 1153   EOSABS 0.1 09/15/2017 1153   BASOSABS 0.0 09/15/2017 1153   Iron/TIBC/Ferritin/ %Sat No results found for: IRON, TIBC, FERRITIN, IRONPCTSAT Lipid Panel     Component Value Date/Time   CHOL 148 04/08/2019 0826   TRIG 67 04/08/2019 0826   HDL 48 04/08/2019 0826   CHOLHDL 3 05/18/2018 1801   VLDL 21.2 05/18/2018 1801   LDLCALC 87 04/08/2019 0826   LDLDIRECT 140.5 08/12/2013 0913   Hepatic Function Panel     Component Value Date/Time   PROT 7.7 04/08/2019 0826   ALBUMIN 4.4 04/08/2019 0826   AST 18 04/08/2019 0826   ALT 16 04/08/2019 0826   ALKPHOS 76 04/08/2019 0826   BILITOT 0.3 04/08/2019 0826   BILIDIR 0.0 02/22/2015 1206      Component Value Date/Time   TSH 2.530 04/08/2019 0826   TSH 1.760 08/03/2018 1324   TSH 1.81 09/15/2017 1153     Ref. Range 04/08/2019 08:26  Vitamin D, 25-Hydroxy Latest Ref Range: 30.0 - 100.0 ng/mL 40.1    OBESITY BEHAVIORAL INTERVENTION VISIT  Today's visit was # 20   Starting weight: 212 lbs Starting date: 08/03/2018 Today's weight : 179 lbs Today's date: 06/22/2019 Total lbs lost to date: 33    06/22/2019  Height 5\' 6"  (1.676 m)  Weight 179 lb (81.2 kg)  BMI (Calculated) 28.91  BLOOD PRESSURE - SYSTOLIC [id number]  BLOOD PRESSURE - DIASTOLIC 71   Body Fat % 0000000 %  Total Body Water (lbs) 70.4 lbs    ASK: We discussed the diagnosis of obesity with Marton Redwood today and [place] agreed to give [name] permission to discuss obesity behavioral modification therapy today.  ASSESS: Dale has the diagnosis of obesity and her BMI today  is 28.91 Rise is in the action stage of change   ADVISE: Lexxi was educated on the multiple health risks of obesity as well as the benefit of weight loss to improve her health. She was advised of the need for long term treatment and the  importance of lifestyle modifications to improve her current health and to decrease her risk of future health problems.  AGREE: Multiple dietary modification options and treatment options were discussed and  Mercer agreed to follow the recommendations documented in the above note.  ARRANGE: Khaleya was educated on the importance of frequent visits to treat obesity as outlined per CMS and USPSTF guidelines and agreed to schedule her next follow up appointment today.  Alicia Knapp, am acting as Location manager for [employer]. Alicia Shark, DO  I have reviewed the above documentation for accuracy and completeness, and I agree with the above. -Alicia Lesch, DO

## 2019-06-28 ENCOUNTER — Encounter (INDEPENDENT_AMBULATORY_CARE_PROVIDER_SITE_OTHER): Payer: Self-pay | Admitting: Bariatrics

## 2019-07-04 ENCOUNTER — Other Ambulatory Visit: Payer: Self-pay | Admitting: Family Medicine

## 2019-07-04 DIAGNOSIS — I10 Essential (primary) hypertension: Secondary | ICD-10-CM

## 2019-07-06 ENCOUNTER — Other Ambulatory Visit: Payer: Self-pay

## 2019-07-06 ENCOUNTER — Ambulatory Visit (INDEPENDENT_AMBULATORY_CARE_PROVIDER_SITE_OTHER): Admitting: Bariatrics

## 2019-07-06 ENCOUNTER — Encounter (INDEPENDENT_AMBULATORY_CARE_PROVIDER_SITE_OTHER): Payer: Self-pay | Admitting: Bariatrics

## 2019-07-06 VITALS — BP 129/76 | HR 62 | Temp 98.7°F | Ht 66.0 in | Wt 179.0 lb

## 2019-07-06 DIAGNOSIS — E669 Obesity, unspecified: Secondary | ICD-10-CM

## 2019-07-06 DIAGNOSIS — R0602 Shortness of breath: Secondary | ICD-10-CM | POA: Diagnosis not present

## 2019-07-06 DIAGNOSIS — E559 Vitamin D deficiency, unspecified: Secondary | ICD-10-CM

## 2019-07-06 DIAGNOSIS — Z9189 Other specified personal risk factors, not elsewhere classified: Secondary | ICD-10-CM | POA: Diagnosis not present

## 2019-07-06 DIAGNOSIS — R5383 Other fatigue: Secondary | ICD-10-CM | POA: Diagnosis not present

## 2019-07-06 DIAGNOSIS — Z6830 Body mass index (BMI) 30.0-30.9, adult: Secondary | ICD-10-CM

## 2019-07-06 DIAGNOSIS — E8881 Metabolic syndrome: Secondary | ICD-10-CM

## 2019-07-06 MED ORDER — METFORMIN HCL 500 MG PO TABS: 500.0000 mg | ORAL_TABLET | Freq: Two times a day (BID) | ORAL | 0 refills | Status: DC

## 2019-07-07 ENCOUNTER — Encounter (INDEPENDENT_AMBULATORY_CARE_PROVIDER_SITE_OTHER): Payer: Self-pay | Admitting: Bariatrics

## 2019-07-07 NOTE — Progress Notes (Signed)
Office: [phone]  /  Fax: [phone]   HPI:   Chief Complaint: OBESITY Alicia Knapp is here to discuss her progress with her obesity treatment plan. She is on the Category 2 plan and is following her eating plan approximately 80% of the time. She states she is doing weights 30 minutes 2 times per week and cardio 50 minutes 3 times per week. Alicia Knapp is stable at the same weight. Her IC has gone up slightly from 1072 to 1110 kcal. Her total weight loss is 33 lbs.  Her weight is 179 lb (81.2 kg) today and has not lost weight since her last visit. She has lost 33 lbs since starting treatment with [name].  Insulin Resistance Alicia Knapp has a diagnosis of insulin resistance based on her elevated fasting insulin level >5. Last A1c 5.4 on 04/08/2019 with an insulin of 20.3. Although Alicia Knapp's blood glucose readings are still under good control, insulin resistance puts her at greater risk of metabolic syndrome and diabetes. She is taking metformin currently and reports greater satiety with the medication. She continues to work on diet and exercise to decrease risk of diabetes.  At risk for diabetes Alicia Knapp is at higher than average risk for developing diabetes due to her obesity. She currently denies polyuria or polydipsia.  Vitamin D deficiency Alicia Knapp has a diagnosis of Vitamin D deficiency. Last Vitamin D 40.1 on 04/08/2019. She is currently taking OTC Vit D and denies nausea, vomiting or muscle weakness.  Fatigue and Shortness of Breath Alicia Knapp feels her energy is lower than it should be. She is continuing activities and this is improving slightly.  ASSESSMENT AND PLAN:  Insulin resistance - Plan: metFORMIN (GLUCOPHAGE) 500 MG tablet  Vitamin D deficiency  At risk for diabetes mellitus  Other fatigue  Shortness of breath on exertion  Class 1 obesity with serious comorbidity and body mass index (BMI) of 30.0 to 30.9 in adult, unspecified obesity type  PLAN:  Insulin Resistance Alicia Knapp will continue to work on  weight loss, exercise, and decreasing simple carbohydrates in her diet to help decrease the risk of diabetes. We dicussed metformin including benefits and risks. She was informed that eating too many simple carbohydrates or too many calories at one sitting increases the likelihood of GI side effects. Alicia Knapp was given a prescription for metformin 500 mg 1 PO BID #60 with 0 refills and she agrees to follow-up with our clinic in 2 weeks.  Diabetes risk counseling Alicia Knapp was given extended (15 minutes) diabetes prevention counseling today. She is 63 y.o. female and has risk factors for diabetes including obesity. We discussed intensive lifestyle modifications today with an emphasis on weight loss as well as increasing exercise and decreasing simple carbohydrates in her diet.  Vitamin D Deficiency Alicia Knapp was informed that low Vitamin D levels contributes to fatigue and are associated with obesity, breast, and colon cancer. She agrees to continue taking OTC Vit D and will follow-up for routine testing of Vitamin D, at least 2-3 times per year. She was informed of the risk of over-replacement of Vitamin D and agrees to not increase her dose unless she discusses this with [name] first. Alicia Knapp agrees to follow-up with our clinic in 2 weeks.  Fatigue and Shortness of Breath Alicia Knapp was informed her IC is improving. She will gradually increase her activities and follow-up with [name] as directed to monitor her progress.  Obesity Alicia Knapp is currently in the action stage of change. As such, her goal is to continue with weight loss efforts.  She has agreed to follow the Category 1 plan. Alicia Knapp will work on meal planning, intentional eating, and increase protein. She will write out calorie and protein goals for each meal. Alicia Knapp has been instructed to continue her weights and cardio for weight loss and overall health benefits. We discussed the following Behavioral Modification Strategies today: increasing lean protein intake, decreasing  simple carbohydrates, increasing vegetables, increase H20 intake, decrease eating out, no skipping meals, work on meal planning and easy cooking plans, keeping healthy foods in the home, and planning for success.  Alicia Knapp has agreed to follow-up with our clinic in 2 weeks. She was informed of the importance of frequent follow-up visits to maximize her success with intensive lifestyle modifications for her multiple health conditions.  ALLERGIES: Allergies  Allergen Reactions  . Erythromycin     Causes stomach cramps  . Penicillins     rash    MEDICATIONS: Current Outpatient Medications on File Prior to Visit  Medication Sig Dispense Refill  . amLODipine (NORVASC) 10 MG tablet TAKE 1 TABLET DAILY 90 tablet 4  . ARIPiprazole (ABILIFY) 2 MG tablet Take 2 mg by mouth daily.     . ARIPiprazole (ABILIFY) 5 MG tablet Take 1 tablet by mouth daily.  0  . atorvastatin (LIPITOR) 10 MG tablet TAKE 1 TABLET DAILY 90 tablet 3  . carvedilol (COREG) 6.25 MG tablet Take 1 tablet (6.25 mg total) by mouth 2 (two) times daily with a meal. 180 tablet 1  . cholecalciferol (VITAMIN D3) 25 MCG (1000 UT) tablet Take 2,000 Units by mouth daily.    . fluticasone (FLONASE) 50 MCG/ACT nasal spray USE 2 SPRAYS IN EACH NOSTRIL DAILY 48 g 3  . hydrochlorothiazide (HYDRODIURIL) 25 MG tablet TAKE 1 TABLET DAILY 90 tablet 1  . levocetirizine (XYZAL) 5 MG tablet Take 1 tablet (5 mg total) by mouth every evening. 90 tablet 3  . losartan (COZAAR) 50 MG tablet TAKE 1 TABLET DAILY 90 tablet 3   No current facility-administered medications on file prior to visit.     PAST MEDICAL HISTORY: Past Medical History:  Diagnosis Date  . Anxiety   . B12 deficiency   . Constipation   . Heart murmur   . Hyperlipidemia   . Hypertension   . Joint pain   . Postoperative nausea   . Prediabetes   . Swelling    lower ext  . Vitamin D deficiency     PAST SURGICAL HISTORY: Past Surgical History:  Procedure Laterality Date  .  ABDOMINAL HYSTERECTOMY    . APPENDECTOMY    . COLONOSCOPY  05/22/2015   Dr.Pyrtle  . POLYPECTOMY      SOCIAL HISTORY: Social History   Tobacco Use  . Smoking status: Former Smoker    Types: Cigarettes    Quit date: 09/17/2014    Years since quitting: 4.8  . Smokeless tobacco: Never Used  Substance Use Topics  . Alcohol use: No    Alcohol/week: 0.0 standard drinks  . Drug use: No    FAMILY HISTORY: Family History  Problem Relation Age of Onset  . Cancer Father        prostate  . Prostate cancer Father   . Diabetes Mother   . Thyroid disease Mother   . Hypertension Mother   . Hyperlipidemia Mother   . Kidney disease Mother   . Cancer Mother   . Sleep apnea Mother   . Obesity Mother   . Hypertension Sister   . Hypertension Brother   .  Stroke Brother   . Colon cancer Brother 71       died 66 - 4-5 months after dx  . Arthritis Maternal Grandmother   . Esophageal cancer Neg Hx   . Rectal cancer Neg Hx   . Stomach cancer Neg Hx    ROS: Review of Systems  Constitutional: Positive for malaise/fatigue.  Respiratory: Positive for shortness of breath.   Gastrointestinal: Negative for nausea and vomiting.  Musculoskeletal:       Negative for muscle weakness.   PHYSICAL EXAM: Blood pressure 129/76, pulse 62, temperature 98.7 F (37.1 C), temperature source Oral, height 5\' 6"  (1.676 m), weight 179 lb (81.2 kg), SpO2 99 %. Body mass index is 28.89 kg/m. Physical Exam Vitals signs reviewed.  Constitutional:      Appearance: Normal appearance. She is obese.  Cardiovascular:     Rate and Rhythm: Normal rate.     Pulses: Normal pulses.  Pulmonary:     Effort: Pulmonary effort is normal.     Breath sounds: Normal breath sounds.  Musculoskeletal: Normal range of motion.  Skin:    General: Skin is warm and dry.  Neurological:     Mental Status: She is alert and oriented to person, place, and time.  Psychiatric:        Behavior: Behavior normal.   RECENT LABS AND  TESTS: BMET    Component Value Date/Time   NA 143 04/08/2019 0826   K 4.7 04/08/2019 0826   CL 103 04/08/2019 0826   CO2 24 04/08/2019 0826   GLUCOSE 105 (H) 04/08/2019 0826   GLUCOSE 98 05/18/2018 1801   BUN 14 04/08/2019 0826   CREATININE 0.90 04/08/2019 0826   CALCIUM 10.0 04/08/2019 0826   GFRNONAA 69 04/08/2019 0826   GFRAA 80 04/08/2019 0826   Lab Results  Component Value Date   HGBA1C 5.4 04/08/2019   HGBA1C 5.5 11/09/2018   HGBA1C 5.7 05/18/2018   HGBA1C 5.6 12/21/2017   Lab Results  Component Value Date   INSULIN 20.3 04/08/2019   INSULIN 13.8 11/09/2018   INSULIN 15.4 08/03/2018   CBC    Component Value Date/Time   WBC 3.6 (L) 09/15/2017 1153   RBC 4.43 09/15/2017 1153   HGB 12.7 09/15/2017 1153   HCT 39.3 09/15/2017 1153   PLT 349.0 09/15/2017 1153   MCV 88.7 09/15/2017 1153   MCHC 32.3 09/15/2017 1153   RDW 14.3 09/15/2017 1153   LYMPHSABS 1.6 09/15/2017 1153   MONOABS 0.3 09/15/2017 1153   EOSABS 0.1 09/15/2017 1153   BASOSABS 0.0 09/15/2017 1153   Iron/TIBC/Ferritin/ %Sat No results found for: IRON, TIBC, FERRITIN, IRONPCTSAT Lipid Panel     Component Value Date/Time   CHOL 148 04/08/2019 0826   TRIG 67 04/08/2019 0826   HDL 48 04/08/2019 0826   CHOLHDL 3 05/18/2018 1801   VLDL 21.2 05/18/2018 1801   LDLCALC 87 04/08/2019 0826   LDLDIRECT 140.5 08/12/2013 0913   Hepatic Function Panel     Component Value Date/Time   PROT 7.7 04/08/2019 0826   ALBUMIN 4.4 04/08/2019 0826   AST 18 04/08/2019 0826   ALT 16 04/08/2019 0826   ALKPHOS 76 04/08/2019 0826   BILITOT 0.3 04/08/2019 0826   BILIDIR 0.0 02/22/2015 1206      Component Value Date/Time   TSH 2.530 04/08/2019 0826   TSH 1.760 08/03/2018 1324   TSH 1.81 09/15/2017 1153   Results for LATESSA, PEROVICH (MRN [id number]) as of 07/07/2019 07:35  Ref. Range 04/08/2019 08:26  Vitamin D, 25-Hydroxy Latest Ref Range: 30.0 - 100.0 ng/mL 40.1   OBESITY BEHAVIORAL INTERVENTION VISIT  Today's  visit was #21  Starting weight: 212 lbs Starting date: 08/03/2018 Today's weight: 179 lbs Today's date: 07/06/2019 Total lbs lost to date: 33    07/06/2019  Height 5\' 6"  (1.676 m)  Weight 179 lb (81.2 kg)  BMI (Calculated) 28.91  BLOOD PRESSURE - SYSTOLIC [id number]  BLOOD PRESSURE - DIASTOLIC 76   Body Fat % 0000000 %  Total Body Water (lbs) 69.4 lbs  RMR 1110   ASK: We discussed the diagnosis of obesity with Marton Redwood today and Reianna agreed to give [name] permission to discuss obesity behavioral modification therapy today.  ASSESS: Zenja has the diagnosis of obesity and her BMI today is 29.0. Jazlynne is in the action stage of change.  ADVISE: Melda was educated on the multiple health risks of obesity as well as the benefit of weight loss to improve her health. She was advised of the need for long term treatment and the importance of lifestyle modifications to improve her current health and to decrease her risk of future health problems.  AGREE: Multiple dietary modification options and treatment options were discussed and  Davan agreed to follow the recommendations documented in the above note.  ARRANGE: Mekhia was educated on the importance of frequent visits to treat obesity as outlined per CMS and USPSTF guidelines and agreed to schedule her next follow up appointment today.  Migdalia Dk, am acting as Location manager for [employer], DO  I have reviewed the above documentation for accuracy and completeness, and I agree with the above. -Jearld Lesch, DO

## 2019-07-19 ENCOUNTER — Other Ambulatory Visit: Payer: Self-pay | Admitting: Family Medicine

## 2019-07-19 DIAGNOSIS — I10 Essential (primary) hypertension: Secondary | ICD-10-CM

## 2019-07-27 ENCOUNTER — Ambulatory Visit (INDEPENDENT_AMBULATORY_CARE_PROVIDER_SITE_OTHER): Admitting: Bariatrics

## 2019-07-28 ENCOUNTER — Encounter (INDEPENDENT_AMBULATORY_CARE_PROVIDER_SITE_OTHER): Payer: Self-pay | Admitting: Bariatrics

## 2019-07-28 ENCOUNTER — Ambulatory Visit (INDEPENDENT_AMBULATORY_CARE_PROVIDER_SITE_OTHER): Admitting: Bariatrics

## 2019-07-28 ENCOUNTER — Other Ambulatory Visit: Payer: Self-pay

## 2019-07-28 VITALS — BP 103/64 | HR 63 | Temp 98.1°F | Ht 66.0 in | Wt 177.0 lb

## 2019-07-28 DIAGNOSIS — E669 Obesity, unspecified: Secondary | ICD-10-CM

## 2019-07-28 DIAGNOSIS — E8881 Metabolic syndrome: Secondary | ICD-10-CM | POA: Diagnosis not present

## 2019-07-28 DIAGNOSIS — Z6830 Body mass index (BMI) 30.0-30.9, adult: Secondary | ICD-10-CM

## 2019-07-28 DIAGNOSIS — E559 Vitamin D deficiency, unspecified: Secondary | ICD-10-CM

## 2019-07-28 DIAGNOSIS — Z9189 Other specified personal risk factors, not elsewhere classified: Secondary | ICD-10-CM

## 2019-08-01 ENCOUNTER — Encounter (INDEPENDENT_AMBULATORY_CARE_PROVIDER_SITE_OTHER): Payer: Self-pay | Admitting: Bariatrics

## 2019-08-01 NOTE — Progress Notes (Signed)
Office: [phone]  /  Fax: [phone]   HPI:   Chief Complaint: OBESITY Alicia Knapp is here to discuss her progress with her obesity treatment plan. She is on the Category 1 plan and is following her eating plan approximately 85% of the time. She states she is working with weights 30 minutes 2 times per week and doing cardio 60 minutes 3 times per week. Alicia Knapp is down 2 lbs and doing well overall. Her weight is 177 lb (80.3 kg) today and has had a weight loss of 2 pounds over a period of 3 weeks since her last visit. She has lost 35 lbs since starting treatment with [name].  Insulin Resistance Alicia Knapp has a diagnosis of insulin resistance based on her elevated fasting insulin level >5. Last A1c 5.4 on 04/08/2019 with an insulin of 20.3. Although Alicia Knapp's blood glucose readings are still under good control, insulin resistance puts her at greater risk of metabolic syndrome and diabetes. Alicia Knapp is taking metformin currently and reports minimal hunger. She continues to work on diet and exercise to decrease risk of diabetes.  At risk for diabetes Alicia Knapp is at higher than average risk for developing diabetes due to her obesity. She currently denies polyuria or polydipsia.  Vitamin D deficiency Alicia Knapp has a diagnosis of Vitamin D deficiency. Last Vitamin D 40.1 on 04/08/2019. She is currently taking Vit D TID and denies nausea, vomiting or muscle weakness.  ASSESSMENT AND PLAN:  Insulin resistance  Vitamin D deficiency  At risk for diabetes mellitus  Class 1 obesity with serious comorbidity and body mass index (BMI) of 30.0 to 30.9 in adult, unspecified obesity type - Starting BMI greater  PLAN:  Insulin Resistance [name] will continue to work on weight loss, exercise, and decreasing simple carbohydrates in her diet to help decrease the risk of diabetes. We dicussed metformin including benefits and risks. She was informed that eating too many simple carbohydrates or too many calories at one sitting increases  the likelihood of GI side effects. Sable was given a prescription for metformin 500 mg 1 BID #60 with 0 refills and agrees to follow-up with our clinic in 2 weeks.  Diabetes risk counseling [name] was given extended (15 minutes) diabetes prevention counseling today. She is 63 y.o. female and has risk factors for diabetes including obesity. We discussed intensive lifestyle modifications today with an emphasis on weight loss as well as increasing exercise and decreasing simple carbohydrates in her diet.  Vitamin D Deficiency Alicia Knapp was informed that low Vitamin D levels contributes to fatigue and are associated with obesity, breast, and colon cancer. She agrees to have routine testing of Vitamin D. She was informed of the risk of over-replacement of Vitamin D and agrees to not increase her dose unless she discusses this with [name] first. Alicia Knapp agrees to follow-up with our clinic in 2 weeks.  Obesity Alicia Knapp is currently in the action stage of change. As such, her goal is to continue with weight loss efforts. She has agreed to follow the Category 1 plan. Alicia Knapp will work on meal planning, intentional eating, and will save her snack for after dinner. Alicia Knapp has been instructed to work up to a goal of 150 minutes of combined cardio and strengthening exercise per week for weight loss and overall health benefits. We discussed the following Behavioral Modification Strategies today: increasing lean protein intake, decreasing simple carbohydrates, increasing vegetables, increase H20 intake, decrease eating out, no skipping meals, work on meal planning and easy cooking plans, keeping healthy foods  in the home, and planning for success.  Alicia Knapp has agreed to follow-up with our clinic in 2 weeks (fasting). She was informed of the importance of frequent follow-up visits to maximize her success with intensive lifestyle modifications for her multiple health conditions.  ALLERGIES: Allergies  Allergen Reactions  . Erythromycin      Causes stomach cramps  . Penicillins     rash    MEDICATIONS: Current Outpatient Medications on File Prior to Visit  Medication Sig Dispense Refill  . amLODipine (NORVASC) 10 MG tablet TAKE 1 TABLET DAILY 90 tablet 4  . ARIPiprazole (ABILIFY) 2 MG tablet Take 2 mg by mouth daily.     . ARIPiprazole (ABILIFY) 5 MG tablet Take 1 tablet by mouth daily.  0  . atorvastatin (LIPITOR) 10 MG tablet TAKE 1 TABLET DAILY 90 tablet 3  . carvedilol (COREG) 6.25 MG tablet Take 1 tablet (6.25 mg total) by mouth 2 (two) times daily with a meal. 180 tablet 1  . cholecalciferol (VITAMIN D3) 25 MCG (1000 UT) tablet Take 2,000 Units by mouth daily.    . fluticasone (FLONASE) 50 MCG/ACT nasal spray USE 2 SPRAYS IN EACH NOSTRIL DAILY 48 g 3  . hydrochlorothiazide (HYDRODIURIL) 25 MG tablet TAKE 1 TABLET DAILY 90 tablet 1  . levocetirizine (XYZAL) 5 MG tablet Take 1 tablet (5 mg total) by mouth every evening. 90 tablet 3  . losartan (COZAAR) 50 MG tablet TAKE 1 TABLET DAILY 90 tablet 3  . metFORMIN (GLUCOPHAGE) 500 MG tablet Take 1 tablet (500 mg total) by mouth 2 (two) times daily with a meal. 60 tablet 0   No current facility-administered medications on file prior to visit.     PAST MEDICAL HISTORY: Past Medical History:  Diagnosis Date  . Anxiety   . B12 deficiency   . Constipation   . Heart murmur   . Hyperlipidemia   . Hypertension   . Joint pain   . Postoperative nausea   . Prediabetes   . Swelling    lower ext  . Vitamin D deficiency     PAST SURGICAL HISTORY: Past Surgical History:  Procedure Laterality Date  . ABDOMINAL HYSTERECTOMY    . APPENDECTOMY    . COLONOSCOPY  05/22/2015   Dr.Pyrtle  . POLYPECTOMY      SOCIAL HISTORY: Social History   Tobacco Use  . Smoking status: Former Smoker    Types: Cigarettes    Quit date: 09/17/2014    Years since quitting: 4.8  . Smokeless tobacco: Never Used  Substance Use Topics  . Alcohol use: No    Alcohol/week: 0.0 standard  drinks  . Drug use: No    FAMILY HISTORY: Family History  Problem Relation Age of Onset  . Cancer Father        prostate  . Prostate cancer Father   . Diabetes Mother   . Thyroid disease Mother   . Hypertension Mother   . Hyperlipidemia Mother   . Kidney disease Mother   . Cancer Mother   . Sleep apnea Mother   . Obesity Mother   . Hypertension Sister   . Hypertension Brother   . Stroke Brother   . Colon cancer Brother 11       died 26 - 4-5 months after dx  . Arthritis Maternal Grandmother   . Esophageal cancer Neg Hx   . Rectal cancer Neg Hx   . Stomach cancer Neg Hx    ROS: Review of Systems  Gastrointestinal: Negative  for nausea and vomiting.  Musculoskeletal:       Negative for muscle weakness.  Endo/Heme/Allergies:       Positive for minimal hunger.   PHYSICAL EXAM: Blood pressure 103/64, pulse 63, temperature 98.1 F (36.7 C), temperature source Oral, height 5\' 6"  (1.676 m), weight 177 lb (80.3 kg), SpO2 100 %. Body mass index is 28.57 kg/m. Physical Exam Vitals signs reviewed.  Constitutional:      Appearance: Normal appearance. She is obese.  Cardiovascular:     Rate and Rhythm: Normal rate.     Pulses: Normal pulses.  Pulmonary:     Effort: Pulmonary effort is normal.     Breath sounds: Normal breath sounds.  Musculoskeletal: Normal range of motion.  Skin:    General: Skin is warm and dry.  Neurological:     Mental Status: She is alert and oriented to person, place, and time.  Psychiatric:        Behavior: Behavior normal.   RECENT LABS AND TESTS: BMET    Component Value Date/Time   NA 143 04/08/2019 0826   K 4.7 04/08/2019 0826   CL 103 04/08/2019 0826   CO2 24 04/08/2019 0826   GLUCOSE 105 (H) 04/08/2019 0826   GLUCOSE 98 05/18/2018 1801   BUN 14 04/08/2019 0826   CREATININE 0.90 04/08/2019 0826   CALCIUM 10.0 04/08/2019 0826   GFRNONAA 69 04/08/2019 0826   GFRAA 80 04/08/2019 0826   Lab Results  Component Value Date   HGBA1C  5.4 04/08/2019   HGBA1C 5.5 11/09/2018   HGBA1C 5.7 05/18/2018   HGBA1C 5.6 12/21/2017   Lab Results  Component Value Date   INSULIN 20.3 04/08/2019   INSULIN 13.8 11/09/2018   INSULIN 15.4 08/03/2018   CBC    Component Value Date/Time   WBC 3.6 (L) 09/15/2017 1153   RBC 4.43 09/15/2017 1153   HGB 12.7 09/15/2017 1153   HCT 39.3 09/15/2017 1153   PLT 349.0 09/15/2017 1153   MCV 88.7 09/15/2017 1153   MCHC 32.3 09/15/2017 1153   RDW 14.3 09/15/2017 1153   LYMPHSABS 1.6 09/15/2017 1153   MONOABS 0.3 09/15/2017 1153   EOSABS 0.1 09/15/2017 1153   BASOSABS 0.0 09/15/2017 1153   Iron/TIBC/Ferritin/ %Sat No results found for: IRON, TIBC, FERRITIN, IRONPCTSAT Lipid Panel     Component Value Date/Time   CHOL 148 04/08/2019 0826   TRIG 67 04/08/2019 0826   HDL 48 04/08/2019 0826   CHOLHDL 3 05/18/2018 1801   VLDL 21.2 05/18/2018 1801   LDLCALC 87 04/08/2019 0826   LDLDIRECT 140.5 08/12/2013 0913   Hepatic Function Panel     Component Value Date/Time   PROT 7.7 04/08/2019 0826   ALBUMIN 4.4 04/08/2019 0826   AST 18 04/08/2019 0826   ALT 16 04/08/2019 0826   ALKPHOS 76 04/08/2019 0826   BILITOT 0.3 04/08/2019 0826   BILIDIR 0.0 02/22/2015 1206      Component Value Date/Time   TSH 2.530 04/08/2019 0826   TSH 1.760 08/03/2018 1324   TSH 1.81 09/15/2017 1153   Results for SHAWNEY, KWIAT (MRN [id number]) as of 08/01/2019 10:02  Ref. Range 04/08/2019 08:26  Vitamin D, 25-Hydroxy Latest Ref Range: 30.0 - 100.0 ng/mL 40.1   OBESITY BEHAVIORAL INTERVENTION VISIT  Today's visit was #22   Starting weight: 212 lbs Starting date: 08/03/2018 Today's weight: 177 lbs Today's date: 07/28/2019 Total lbs lost to date: 35 At least 15 minutes were spent on discussing the following behavioral intervention visit.  07/28/2019  Height 5\' 6"  (1.676 m)  Weight 177 lb (80.3 kg)  BMI (Calculated) 28.58  BLOOD PRESSURE - SYSTOLIC [id number]  BLOOD PRESSURE - DIASTOLIC 64   Body Fat % 40 %   Total Body Water (lbs) 66.6 lbs   ASK: We discussed the diagnosis of obesity with Marton Redwood today and Mahra agreed to give [name] permission to discuss obesity behavioral modification therapy today.  ASSESS: Yolet has the diagnosis of obesity and her BMI today is 28.6. Tienna is in the action stage of change.   ADVISE: Gera was educated on the multiple health risks of obesity as well as the benefit of weight loss to improve her health. She was advised of the need for long term treatment and the importance of lifestyle modifications to improve her current health and to decrease her risk of future health problems.  AGREE: Multiple dietary modification options and treatment options were discussed and  Benita agreed to follow the recommendations documented in the above note.  ARRANGE: Tamelia was educated on the importance of frequent visits to treat obesity as outlined per CMS and USPSTF guidelines and agreed to schedule her next follow up appointment today.  Migdalia Dk, am acting as Location manager for [employer], DO  I have reviewed the above documentation for accuracy and completeness, and I agree with the above. -Jearld Lesch, DO

## 2019-08-14 ENCOUNTER — Other Ambulatory Visit: Payer: Self-pay | Admitting: Family Medicine

## 2019-08-16 ENCOUNTER — Ambulatory Visit (INDEPENDENT_AMBULATORY_CARE_PROVIDER_SITE_OTHER): Admitting: Bariatrics

## 2019-08-16 ENCOUNTER — Other Ambulatory Visit: Payer: Self-pay

## 2019-08-16 VITALS — BP 137/78 | HR 65 | Temp 98.5°F | Ht 66.0 in | Wt 175.0 lb

## 2019-08-16 DIAGNOSIS — E8881 Metabolic syndrome: Secondary | ICD-10-CM | POA: Diagnosis not present

## 2019-08-16 DIAGNOSIS — E559 Vitamin D deficiency, unspecified: Secondary | ICD-10-CM | POA: Diagnosis not present

## 2019-08-16 DIAGNOSIS — E669 Obesity, unspecified: Secondary | ICD-10-CM | POA: Diagnosis not present

## 2019-08-16 DIAGNOSIS — Z6830 Body mass index (BMI) 30.0-30.9, adult: Secondary | ICD-10-CM

## 2019-08-16 DIAGNOSIS — Z9189 Other specified personal risk factors, not elsewhere classified: Secondary | ICD-10-CM | POA: Diagnosis not present

## 2019-08-16 DIAGNOSIS — E88819 Insulin resistance, unspecified: Secondary | ICD-10-CM

## 2019-08-16 MED ORDER — METFORMIN HCL 500 MG PO TABS: 500.0000 mg | ORAL_TABLET | Freq: Two times a day (BID) | ORAL | 0 refills | Status: DC

## 2019-08-17 LAB — COMPREHENSIVE METABOLIC PANEL
ALT: 18 IU/L (ref 0–32)
AST: 15 IU/L (ref 0–40)
Albumin/Globulin Ratio: 1.8 (ref 1.2–2.2)
Albumin: 4.9 g/dL — ABNORMAL HIGH (ref 3.8–4.8)
Alkaline Phosphatase: 80 IU/L (ref 39–117)
BUN/Creatinine Ratio: 24 (ref 12–28)
BUN: 20 mg/dL (ref 8–27)
Bilirubin Total: 0.4 mg/dL (ref 0.0–1.2)
CO2: 22 mmol/L (ref 20–29)
Calcium: 10 mg/dL (ref 8.7–10.3)
Chloride: 103 mmol/L (ref 96–106)
Creatinine, Ser: 0.82 mg/dL (ref 0.57–1.00)
GFR calc Af Amer: 89 mL/min/{1.73_m2} (ref >59)
GFR calc non Af Amer: 77 mL/min/{1.73_m2} (ref >59)
Globulin, Total: 2.8 g/dL (ref 1.5–4.5)
Glucose: 88 mg/dL (ref 65–99)
Potassium: 4.1 mmol/L (ref 3.5–5.2)
Sodium: 141 mmol/L (ref 134–144)
Total Protein: 7.7 g/dL (ref 6.0–8.5)

## 2019-08-17 LAB — HEMOGLOBIN A1C
Est. average glucose Bld gHb Est-mCnc: 100 mg/dL
Hgb A1c MFr Bld: 5.1 % (ref 4.8–5.6)

## 2019-08-17 LAB — VITAMIN D 25 HYDROXY (VIT D DEFICIENCY, FRACTURES): Vit D, 25-Hydroxy: 38.4 ng/mL (ref 30.0–100.0)

## 2019-08-17 LAB — INSULIN, RANDOM: INSULIN: 10.4 u[IU]/mL (ref 2.6–24.9)

## 2019-08-17 NOTE — Progress Notes (Signed)
Office: [phone]  /  Fax: [phone]   HPI:   Chief Complaint: OBESITY Alicia Knapp is here to discuss her progress with her obesity treatment plan. She is on the Category 1 plan and is following her eating plan approximately 80 % of the time. She states she is doing weights 25 to 30 minutes 2 times per week and cardio exercise 60 minutes 3 times per week. Alicia Knapp is down 2 pounds and she has done well overall.  Her weight is 175 lb (79.4 kg) today and has had a weight loss of 2 pounds over a period of 2 weeks since her last visit. She has lost 37 lbs since starting treatment with [name].  Vitamin D deficiency Alicia Knapp has a diagnosis of vitamin D deficiency. Alicia Knapp is currently taking OTC vit D and she denies nausea, vomiting or muscle weakness.  At risk for osteopenia and osteoporosis Alicia Knapp is at higher risk of osteopenia and osteoporosis due to vitamin D deficiency.   Insulin Resistance Alicia Knapp has a diagnosis of insulin resistance based on her elevated fasting insulin level >5. Although Alicia Knapp's blood glucose readings are still under good control, insulin resistance puts her at greater risk of metabolic syndrome and diabetes. She is taking metformin currently and continues to work on diet and exercise to decrease risk of diabetes. Alicia Knapp denies polyphagia.  ASSESSMENT AND PLAN:  Vitamin D deficiency - Plan: VITAMIN D 25 Hydroxy (Vit-D Deficiency, Fractures)  Insulin resistance - Plan: Hemoglobin A1c, Insulin, random, Comprehensive metabolic panel, metFORMIN (GLUCOPHAGE) 500 MG tablet  At risk for osteoporosis  Class 1 obesity with serious comorbidity and body mass index (BMI) of 30.0 to 30.9 in adult, unspecified obesity type  PLAN:  Vitamin D Deficiency Alicia Knapp was informed that low vitamin D levels contributes to fatigue and are associated with obesity, breast, and colon cancer. Alicia Knapp will continue to take OTC vitamin D 1,000 IU daily and she will follow up for routine testing of vitamin D, at least  2-3 times per year. She was informed of the risk of over-replacement of vitamin D and agrees to not increase her dose unless she discusses this with [name] first. We will check vitamin D and Alicia Knapp will follow up as directed.  At risk for osteopenia and osteoporosis Alicia Knapp was given extended (15 minutes) osteoporosis prevention counseling today. Alicia Knapp is at risk for osteopenia and osteoporosis due to her vitamin D deficiency. She was encouraged to take her vitamin D and follow her higher calcium diet and increase strengthening exercise to help strengthen her bones and decrease her risk of osteopenia and osteoporosis.  Insulin Resistance Alicia Knapp will continue to work on weight loss, exercise, and decreasing simple carbohydrates in her diet to help decrease the risk of diabetes. We dicussed metformin including benefits and risks. She was informed that eating too many simple carbohydrates or too many calories at one sitting increases the likelihood of GI side effects. We will check A1c and insulin level today. Alicia Knapp agrees to continue metformin 500 mg two times daily with a meal #60 with no refills and follow up with [name] as directed to monitor her progress.  Obesity Alicia Knapp is currently in the action stage of change. As such, her goal is to continue with weight loss efforts She has agreed to keep a food journal with 300 to 400 calories and 35+ grams of protein daily at dinner and follow the Category 1 plan Alicia Knapp will continue her current exercise regimen for weight loss and overall health benefits. We discussed  the following Behavioral Modification Strategies today: planning for success, increase H2O intake, no skipping meals, keeping healthy foods in the home, increasing lean protein intake, decreasing simple carbohydrates, increasing vegetables, decrease eating out and work on meal planning and intentional eating Recipes and goals for dinner were given to patient today.  Alicia Knapp has agreed to follow up with our clinic in  2 weeks. She was informed of the importance of frequent follow up visits to maximize her success with intensive lifestyle modifications for her multiple health conditions.  ALLERGIES: Allergies  Allergen Reactions  . Erythromycin     Causes stomach cramps  . Penicillins     rash    MEDICATIONS: Current Outpatient Medications on File Prior to Visit  Medication Sig Dispense Refill  . amLODipine (NORVASC) 10 MG tablet TAKE 1 TABLET DAILY 90 tablet 4  . ARIPiprazole (ABILIFY) 2 MG tablet Take 2 mg by mouth daily.     . ARIPiprazole (ABILIFY) 5 MG tablet Take 1 tablet by mouth daily.  0  . atorvastatin (LIPITOR) 10 MG tablet TAKE 1 TABLET DAILY 90 tablet 3  . carvedilol (COREG) 6.25 MG tablet Take 1 tablet (6.25 mg total) by mouth 2 (two) times daily with a meal. 180 tablet 1  . cholecalciferol (VITAMIN D3) 25 MCG (1000 UT) tablet Take 2,000 Units by mouth daily.    . fluticasone (FLONASE) 50 MCG/ACT nasal spray USE 2 SPRAYS IN EACH NOSTRIL DAILY 48 g 3  . hydrochlorothiazide (HYDRODIURIL) 25 MG tablet TAKE 1 TABLET DAILY 90 tablet 1  . losartan (COZAAR) 50 MG tablet TAKE 1 TABLET DAILY 90 tablet 3  . levocetirizine (XYZAL) 5 MG tablet TAKE 1 TABLET EVERY EVENING 90 tablet 0   No current facility-administered medications on file prior to visit.     PAST MEDICAL HISTORY: Past Medical History:  Diagnosis Date  . Anxiety   . B12 deficiency   . Constipation   . Heart murmur   . Hyperlipidemia   . Hypertension   . Joint pain   . Postoperative nausea   . Prediabetes   . Swelling    lower ext  . Vitamin D deficiency     PAST SURGICAL HISTORY: Past Surgical History:  Procedure Laterality Date  . ABDOMINAL HYSTERECTOMY    . APPENDECTOMY    . COLONOSCOPY  05/22/2015   Dr.Pyrtle  . POLYPECTOMY      SOCIAL HISTORY: Social History   Tobacco Use  . Smoking status: Former Smoker    Types: Cigarettes    Quit date: 09/17/2014    Years since quitting: 4.9  . Smokeless tobacco:  Never Used  Substance Use Topics  . Alcohol use: No    Alcohol/week: 0.0 standard drinks  . Drug use: No    FAMILY HISTORY: Family History  Problem Relation Age of Onset  . Cancer Father        prostate  . Prostate cancer Father   . Diabetes Mother   . Thyroid disease Mother   . Hypertension Mother   . Hyperlipidemia Mother   . Kidney disease Mother   . Cancer Mother   . Sleep apnea Mother   . Obesity Mother   . Hypertension Sister   . Hypertension Brother   . Stroke Brother   . Colon cancer Brother 32       died 41 - 4-5 months after dx  . Arthritis Maternal Grandmother   . Esophageal cancer Neg Hx   . Rectal cancer Neg Hx   .  Stomach cancer Neg Hx     ROS: Review of Systems  Constitutional: Positive for weight loss.  Gastrointestinal: Negative for nausea and vomiting.  Musculoskeletal:       Negative for muscle weakness  Endo/Heme/Allergies:       Negative for polyphagia    PHYSICAL EXAM: Blood pressure 137/78, pulse 65, temperature 98.5 F (36.9 C), temperature source Oral, height 5\' 6"  (1.676 m), weight 175 lb (79.4 kg), SpO2 99 %. Body mass index is 28.25 kg/m. Physical Exam Vitals signs reviewed.  Constitutional:      Appearance: Normal appearance. She is well-developed. She is obese.  Cardiovascular:     Rate and Rhythm: Normal rate.  Pulmonary:     Effort: Pulmonary effort is normal.  Musculoskeletal: Normal range of motion.  Skin:    General: Skin is warm and dry.  Neurological:     Mental Status: She is alert and oriented to person, place, and time.  Psychiatric:        Mood and Affect: Mood normal.        Behavior: Behavior normal.     RECENT LABS AND TESTS: BMET    Component Value Date/Time   NA 141 08/16/2019 0905   K 4.1 08/16/2019 0905   CL 103 08/16/2019 0905   CO2 22 08/16/2019 0905   GLUCOSE 88 08/16/2019 0905   GLUCOSE 98 05/18/2018 1801   BUN 20 08/16/2019 0905   CREATININE 0.82 08/16/2019 0905   CALCIUM 10.0 08/16/2019  0905   GFRNONAA 77 08/16/2019 0905   GFRAA 89 08/16/2019 0905   Lab Results  Component Value Date   HGBA1C 5.1 08/16/2019   HGBA1C 5.4 04/08/2019   HGBA1C 5.5 11/09/2018   HGBA1C 5.7 05/18/2018   HGBA1C 5.6 12/21/2017   Lab Results  Component Value Date   INSULIN 10.4 08/16/2019   INSULIN 20.3 04/08/2019   INSULIN 13.8 11/09/2018   INSULIN 15.4 08/03/2018   CBC    Component Value Date/Time   WBC 3.6 (L) 09/15/2017 1153   RBC 4.43 09/15/2017 1153   HGB 12.7 09/15/2017 1153   HCT 39.3 09/15/2017 1153   PLT 349.0 09/15/2017 1153   MCV 88.7 09/15/2017 1153   MCHC 32.3 09/15/2017 1153   RDW 14.3 09/15/2017 1153   LYMPHSABS 1.6 09/15/2017 1153   MONOABS 0.3 09/15/2017 1153   EOSABS 0.1 09/15/2017 1153   BASOSABS 0.0 09/15/2017 1153   Iron/TIBC/Ferritin/ %Sat No results found for: IRON, TIBC, FERRITIN, IRONPCTSAT Lipid Panel     Component Value Date/Time   CHOL 148 04/08/2019 0826   TRIG 67 04/08/2019 0826   HDL 48 04/08/2019 0826   CHOLHDL 3 05/18/2018 1801   VLDL 21.2 05/18/2018 1801   LDLCALC 87 04/08/2019 0826   LDLDIRECT 140.5 08/12/2013 0913   Hepatic Function Panel     Component Value Date/Time   PROT 7.7 08/16/2019 0905   ALBUMIN 4.9 (H) 08/16/2019 0905   AST 15 08/16/2019 0905   ALT 18 08/16/2019 0905   ALKPHOS 80 08/16/2019 0905   BILITOT 0.4 08/16/2019 0905   BILIDIR 0.0 02/22/2015 1206      Component Value Date/Time   TSH 2.530 04/08/2019 0826   TSH 1.760 08/03/2018 1324   TSH 1.81 09/15/2017 1153     Ref. Range 04/08/2019 08:26  Vitamin D, 25-Hydroxy Latest Ref Range: 30.0 - 100.0 ng/mL 40.1    OBESITY BEHAVIORAL INTERVENTION VISIT  Today's visit was # 23  Starting weight: 212 lbs Starting date: 08/03/2018 Today's weight :  175 lbs Today's date: 08/16/2019 Total lbs lost to date: 37    08/16/2019  Height 5\' 6"  (1.676 m)  Weight 175 lb (79.4 kg)  BMI (Calculated) 28.26  BLOOD PRESSURE - SYSTOLIC 0000000  BLOOD PRESSURE - DIASTOLIC 78    Body Fat % 42.5 %  Total Body Water (lbs) 65.8 lbs    ASK: We discussed the diagnosis of obesity with Marton Redwood today and Martha agreed to give [name] permission to discuss obesity behavioral modification therapy today.  ASSESS: Cyre has the diagnosis of obesity and her BMI today is 28.26 Joeli is in the action stage of change   ADVISE: Glendene was educated on the multiple health risks of obesity as well as the benefit of weight loss to improve her health. She was advised of the need for long term treatment and the importance of lifestyle modifications to improve her current health and to decrease her risk of future health problems.  AGREE: Multiple dietary modification options and treatment options were discussed and  Nolah agreed to follow the recommendations documented in the above note.  ARRANGE: Jenalyn was educated on the importance of frequent visits to treat obesity as outlined per CMS and USPSTF guidelines and agreed to schedule her next follow up appointment today.  Corey Skains, am acting as Location manager for [employer]. Owens Shark, DO  I have reviewed the above documentation for accuracy and completeness, and I agree with the above. -Jearld Lesch, DO

## 2019-08-18 ENCOUNTER — Encounter (INDEPENDENT_AMBULATORY_CARE_PROVIDER_SITE_OTHER): Payer: Self-pay | Admitting: Bariatrics

## 2019-08-30 ENCOUNTER — Encounter (INDEPENDENT_AMBULATORY_CARE_PROVIDER_SITE_OTHER): Payer: Self-pay | Admitting: Bariatrics

## 2019-08-30 ENCOUNTER — Ambulatory Visit (INDEPENDENT_AMBULATORY_CARE_PROVIDER_SITE_OTHER): Admitting: Bariatrics

## 2019-08-30 ENCOUNTER — Other Ambulatory Visit: Payer: Self-pay

## 2019-08-30 VITALS — BP 114/71 | HR 57 | Temp 98.4°F | Ht 66.0 in | Wt 179.0 lb

## 2019-08-30 DIAGNOSIS — E88819 Insulin resistance, unspecified: Secondary | ICD-10-CM

## 2019-08-30 DIAGNOSIS — E66811 Obesity, class 1: Secondary | ICD-10-CM

## 2019-08-30 DIAGNOSIS — E669 Obesity, unspecified: Secondary | ICD-10-CM | POA: Diagnosis not present

## 2019-08-30 DIAGNOSIS — E8881 Metabolic syndrome: Secondary | ICD-10-CM

## 2019-08-30 DIAGNOSIS — E559 Vitamin D deficiency, unspecified: Secondary | ICD-10-CM

## 2019-08-30 DIAGNOSIS — Z9189 Other specified personal risk factors, not elsewhere classified: Secondary | ICD-10-CM | POA: Diagnosis not present

## 2019-08-30 DIAGNOSIS — Z6830 Body mass index (BMI) 30.0-30.9, adult: Secondary | ICD-10-CM

## 2019-08-30 MED ORDER — VITAMIN D (ERGOCALCIFEROL) 1.25 MG (50000 UNIT) PO CAPS: 50000.0000 [IU] | ORAL_CAPSULE | ORAL | 0 refills | Status: DC

## 2019-08-30 NOTE — Progress Notes (Signed)
Office: [phone]  /  Fax: [phone]   HPI:   Chief Complaint: OBESITY Alicia Knapp is here to discuss her progress with her obesity treatment plan. She is on the Category 1 plan and journaling 300-400 calories + 35 grams of protein at dinner and is following her eating plan approximately 80% of the time. She states she is doing cardio 60 minutes 3 times per week and weights 25 minutes 2 times per week. Alicia Knapp is up 4 lbs and has been snacking on chips and Cheetos and some carry-out. She reports doing okay with her protein and water intake. She is up 3 lbs of water. Her weight is 179 lb (81.2 kg) today and has had a weight gain of 4 lbs since her last visit. She has lost 33 lbs since starting treatment with [name].  Insulin Resistance Alicia Knapp has a diagnosis of insulin resistance based on her elevated fasting insulin level >5. Last A1c 5.1 on 08/16/2019 with an insulin of 10.4. Although Alicia Knapp's blood glucose readings are still under good control, insulin resistance puts her at greater risk of metabolic syndrome and diabetes. She is taking metformin currently and continues to work on diet and exercise to decrease risk of diabetes.  Vitamin D deficiency Alicia Knapp has a diagnosis of Vitamin D deficiency. Last Vitamin D 38.4 on 08/16/2019. She is currently taking prescription Vit D and denies nausea, vomiting or muscle weakness but does report ongoing fatigue.  At risk for osteopenia and osteoporosis Alicia Knapp is at higher risk of osteopenia and osteoporosis due to Vitamin D deficiency.   ASSESSMENT AND PLAN:  Insulin resistance  Vitamin D deficiency - Plan: Vitamin D, Ergocalciferol, (DRISDOL) 1.25 MG (50000 UT) CAPS capsule  At risk for osteoporosis  Class 1 obesity with serious comorbidity and body mass index (BMI) of 30.0 to 30.9 in adult, unspecified obesity type  PLAN:  Insulin Resistance Alicia Knapp will continue to work on weight loss, exercise, and decreasing simple carbohydrates in her diet to help  decrease the risk of diabetes. We dicussed metformin including benefits and risks. She was informed that eating too many simple carbohydrates or too many calories at one sitting increases the likelihood of GI side effects. Alicia Knapp will continue metformin and follow-up as directed.  Vitamin D Deficiency Alicia Knapp was informed that low Vitamin D levels contributes to fatigue and are associated with obesity, breast, and colon cancer. She agrees to continue to take prescription Vit D @ 50,000 IU every week #4 with 0 refills and will follow-up for routine testing of Vitamin D, at least 2-3 times per year. She was informed of the risk of over-replacement of Vitamin D and agrees to not increase her dose unless she discusses this with [name] first. Alicia Knapp agrees to follow-up with our clinic in 2-3 weeks.  At risk for osteopenia and osteoporosis Alicia Knapp was given extended  (15 minutes) osteoporosis prevention counseling today. Alicia Knapp is at risk for osteopenia and osteoporosis due to her Vitamin D deficiency. She was encouraged to take her Vitamin D and follow her higher calcium diet and increase strengthening exercise to help strengthen her bones and decrease her risk of osteopenia and osteoporosis.  Obesity Alicia Knapp is currently in the action stage of change. As such, her goal is to continue with weight loss efforts. She has agreed to follow the Category 1 plan and journal 300-400 calories and 35+ grams of protein at supper. Alicia Knapp will work on meal planning and better snacking options. Labs were reviewed with her today. Alicia Knapp has been  instructed to work up to a goal of 150 minutes of combined cardio and strengthening exercise per week for weight loss and overall health benefits. We discussed the following Behavioral Modification Strategies today: increasing lean protein intake, decreasing simple carbohydrates, increasing vegetables, increase H20 intake, decrease eating out, no skipping meals, work on meal planning and easy cooking  plans, keeping healthy foods in the home, and better snacking choices.   Alicia Knapp has agreed to follow-up with our clinic in 2-3 weeks. She was informed of the importance of frequent follow-up visits to maximize her success with intensive lifestyle modifications for her multiple health conditions.  ALLERGIES: Allergies  Allergen Reactions  . Erythromycin     Causes stomach cramps  . Penicillins     rash    MEDICATIONS: Current Outpatient Medications on File Prior to Visit  Medication Sig Dispense Refill  . amLODipine (NORVASC) 10 MG tablet TAKE 1 TABLET DAILY 90 tablet 4  . ARIPiprazole (ABILIFY) 2 MG tablet Take 2 mg by mouth daily.     . ARIPiprazole (ABILIFY) 5 MG tablet Take 1 tablet by mouth daily.  0  . atorvastatin (LIPITOR) 10 MG tablet TAKE 1 TABLET DAILY 90 tablet 3  . carvedilol (COREG) 6.25 MG tablet Take 1 tablet (6.25 mg total) by mouth 2 (two) times daily with a meal. 180 tablet 1  . cholecalciferol (VITAMIN D3) 25 MCG (1000 UT) tablet Take 2,000 Units by mouth daily.    . fluticasone (FLONASE) 50 MCG/ACT nasal spray USE 2 SPRAYS IN EACH NOSTRIL DAILY 48 g 3  . hydrochlorothiazide (HYDRODIURIL) 25 MG tablet TAKE 1 TABLET DAILY 90 tablet 1  . levocetirizine (XYZAL) 5 MG tablet TAKE 1 TABLET EVERY EVENING 90 tablet 0  . losartan (COZAAR) 50 MG tablet TAKE 1 TABLET DAILY 90 tablet 3  . metFORMIN (GLUCOPHAGE) 500 MG tablet Take 1 tablet (500 mg total) by mouth 2 (two) times daily with a meal. 60 tablet 0   No current facility-administered medications on file prior to visit.     PAST MEDICAL HISTORY: Past Medical History:  Diagnosis Date  . Anxiety   . B12 deficiency   . Constipation   . Heart murmur   . Hyperlipidemia   . Hypertension   . Joint pain   . Postoperative nausea   . Prediabetes   . Swelling    lower ext  . Vitamin D deficiency     PAST SURGICAL HISTORY: Past Surgical History:  Procedure Laterality Date  . ABDOMINAL HYSTERECTOMY    . APPENDECTOMY     . COLONOSCOPY  05/22/2015   Dr.Pyrtle  . POLYPECTOMY      SOCIAL HISTORY: Social History   Tobacco Use  . Smoking status: Former Smoker    Types: Cigarettes    Quit date: 09/17/2014    Years since quitting: 4.9  . Smokeless tobacco: Never Used  Substance Use Topics  . Alcohol use: No    Alcohol/week: 0.0 standard drinks  . Drug use: No    FAMILY HISTORY: Family History  Problem Relation Age of Onset  . Cancer Father        prostate  . Prostate cancer Father   . Diabetes Mother   . Thyroid disease Mother   . Hypertension Mother   . Hyperlipidemia Mother   . Kidney disease Mother   . Cancer Mother   . Sleep apnea Mother   . Obesity Mother   . Hypertension Sister   . Hypertension Brother   . Stroke  Brother   . Colon cancer Brother 63       died 26 - 4-5 months after dx  . Arthritis Maternal Grandmother   . Esophageal cancer Neg Hx   . Rectal cancer Neg Hx   . Stomach cancer Neg Hx    ROS: Review of Systems  Constitutional: Positive for malaise/fatigue.  Gastrointestinal: Negative for nausea and vomiting.  Musculoskeletal:       Negative for muscle weakness.   PHYSICAL EXAM: Blood pressure 114/71, pulse (!) 57, temperature 98.4 F (36.9 C), height 5\' 6"  (1.676 m), weight 179 lb (81.2 kg), SpO2 99 %. Body mass index is 28.89 kg/m. Physical Exam Vitals signs reviewed.  Constitutional:      Appearance: Normal appearance. She is obese.  Cardiovascular:     Rate and Rhythm: Normal rate.     Pulses: Normal pulses.  Pulmonary:     Effort: Pulmonary effort is normal.     Breath sounds: Normal breath sounds.  Musculoskeletal: Normal range of motion.  Skin:    General: Skin is warm and dry.  Neurological:     Mental Status: She is alert and oriented to person, place, and time.  Psychiatric:        Behavior: Behavior normal.   RECENT LABS AND TESTS: BMET    Component Value Date/Time   NA 141 08/16/2019 0905   K 4.1 08/16/2019 0905   CL 103  08/16/2019 0905   CO2 22 08/16/2019 0905   GLUCOSE 88 08/16/2019 0905   GLUCOSE 98 05/18/2018 1801   BUN 20 08/16/2019 0905   CREATININE 0.82 08/16/2019 0905   CALCIUM 10.0 08/16/2019 0905   GFRNONAA 77 08/16/2019 0905   GFRAA 89 08/16/2019 0905   Lab Results  Component Value Date   HGBA1C 5.1 08/16/2019   HGBA1C 5.4 04/08/2019   HGBA1C 5.5 11/09/2018   HGBA1C 5.7 05/18/2018   HGBA1C 5.6 12/21/2017   Lab Results  Component Value Date   INSULIN 10.4 08/16/2019   INSULIN 20.3 04/08/2019   INSULIN 13.8 11/09/2018   INSULIN 15.4 08/03/2018   CBC    Component Value Date/Time   WBC 3.6 (L) 09/15/2017 1153   RBC 4.43 09/15/2017 1153   HGB 12.7 09/15/2017 1153   HCT 39.3 09/15/2017 1153   PLT 349.0 09/15/2017 1153   MCV 88.7 09/15/2017 1153   MCHC 32.3 09/15/2017 1153   RDW 14.3 09/15/2017 1153   LYMPHSABS 1.6 09/15/2017 1153   MONOABS 0.3 09/15/2017 1153   EOSABS 0.1 09/15/2017 1153   BASOSABS 0.0 09/15/2017 1153   Iron/TIBC/Ferritin/ %Sat No results found for: IRON, TIBC, FERRITIN, IRONPCTSAT Lipid Panel     Component Value Date/Time   CHOL 148 04/08/2019 0826   TRIG 67 04/08/2019 0826   HDL 48 04/08/2019 0826   CHOLHDL 3 05/18/2018 1801   VLDL 21.2 05/18/2018 1801   LDLCALC 87 04/08/2019 0826   LDLDIRECT 140.5 08/12/2013 0913   Hepatic Function Panel     Component Value Date/Time   PROT 7.7 08/16/2019 0905   ALBUMIN 4.9 (H) 08/16/2019 0905   AST 15 08/16/2019 0905   ALT 18 08/16/2019 0905   ALKPHOS 80 08/16/2019 0905   BILITOT 0.4 08/16/2019 0905   BILIDIR 0.0 02/22/2015 1206      Component Value Date/Time   TSH 2.530 04/08/2019 0826   TSH 1.760 08/03/2018 1324   TSH 1.81 09/15/2017 1153   Results for HERLINDA, MARMON (MRN [id number]) as of 08/30/2019 14:06  Ref. Range 08/16/2019 09:05  Vitamin D, 25-Hydroxy Latest Ref Range: 30.0 - 100.0 ng/mL 38.4   OBESITY BEHAVIORAL INTERVENTION VISIT  Today's visit was #24   Starting weight: 212 lbs Starting  date: 08/03/2018 Today's weight: 179 lbs  Today's date: 08/30/2019 Total lbs lost to date: 33    08/30/2019  Height 5\' 6"  (1.676 m)  Weight 179 lb (81.2 kg)  BMI (Calculated) 28.91  BLOOD PRESSURE - SYSTOLIC 99991111  BLOOD PRESSURE - DIASTOLIC 71   Body Fat % [id number] %  Total Body Water (lbs) 68.4 lbs   ASK: We discussed the diagnosis of obesity with Alicia Knapp today and Alicia Knapp agreed to give [name] permission to discuss obesity behavioral modification therapy today.  ASSESS: Alicia Knapp has the diagnosis of obesity and her BMI today is 29.0. Alicia Knapp is in the action stage of change.   ADVISE: Alicia Knapp was educated on the multiple health risks of obesity as well as the benefit of weight loss to improve her health. She was advised of the need for long term treatment and the importance of lifestyle modifications to improve her current health and to decrease her risk of future health problems.  AGREE: Multiple dietary modification options and treatment options were discussed and  Alicia Knapp agreed to follow the recommendations documented in the above note.  ARRANGE: Alicia Knapp was educated on the importance of frequent visits to treat obesity as outlined per CMS and USPSTF guidelines and agreed to schedule her next follow up appointment today.  Migdalia Dk, am acting as Location manager for [employer], DO  I have reviewed the above documentation for accuracy and completeness, and I agree with the above. -Jearld Lesch, DO

## 2019-08-31 ENCOUNTER — Encounter (INDEPENDENT_AMBULATORY_CARE_PROVIDER_SITE_OTHER): Payer: Self-pay | Admitting: Bariatrics

## 2019-09-02 ENCOUNTER — Other Ambulatory Visit: Payer: Self-pay

## 2019-09-05 ENCOUNTER — Encounter: Payer: Self-pay | Admitting: Family Medicine

## 2019-09-05 ENCOUNTER — Other Ambulatory Visit: Payer: Self-pay

## 2019-09-05 ENCOUNTER — Ambulatory Visit (INDEPENDENT_AMBULATORY_CARE_PROVIDER_SITE_OTHER): Admitting: Family Medicine

## 2019-09-05 VITALS — BP 120/70 | HR 56 | Temp 97.8°F | Resp 18 | Ht 66.0 in | Wt 180.6 lb

## 2019-09-05 DIAGNOSIS — M79675 Pain in left toe(s): Secondary | ICD-10-CM | POA: Diagnosis not present

## 2019-09-05 DIAGNOSIS — I10 Essential (primary) hypertension: Secondary | ICD-10-CM | POA: Diagnosis not present

## 2019-09-05 DIAGNOSIS — E785 Hyperlipidemia, unspecified: Secondary | ICD-10-CM | POA: Diagnosis not present

## 2019-09-05 MED ORDER — AMLODIPINE BESYLATE 10 MG PO TABS: 10.0000 mg | ORAL_TABLET | Freq: Every day | ORAL | 3 refills | Status: DC

## 2019-09-05 MED ORDER — CARVEDILOL 6.25 MG PO TABS: 6.2500 mg | ORAL_TABLET | Freq: Two times a day (BID) | ORAL | 1 refills | Status: DC

## 2019-09-05 NOTE — Assessment & Plan Note (Signed)
Well controlled, no changes to meds. Encouraged heart healthy diet such as the DASH diet and exercise as tolerated.  °

## 2019-09-05 NOTE — Progress Notes (Addendum)
Patient ID: Alicia Knapp, female    DOB: [birth year]  Age: 63 y.o. MRN: [id number]    Subjective:  Subjective  HPI Alicia Knapp presents for f/u bp and cholesterol  She is c/o L big toe pain-- no known injury She is doing well with healthy weight and wellness.   Review of Systems  Constitutional: Negative for appetite change, diaphoresis, fatigue and unexpected weight change.  Eyes: Negative for pain, redness and visual disturbance.  Respiratory: Negative for cough, chest tightness, shortness of breath and wheezing.   Cardiovascular: Negative for chest pain, palpitations and leg swelling.  Endocrine: Negative for cold intolerance, heat intolerance, polydipsia, polyphagia and polyuria.  Genitourinary: Negative for difficulty urinating, dysuria and frequency.  Musculoskeletal: Positive for arthralgias.  Neurological: Negative for dizziness, light-headedness, numbness and headaches.    History Past Medical History:  Diagnosis Date  . Anxiety   . B12 deficiency   . Constipation   . Heart murmur   . Hyperlipidemia   . Hypertension   . Joint pain   . Postoperative nausea   . Prediabetes   . Swelling    lower ext  . Vitamin D deficiency     She has a past surgical history that includes Abdominal hysterectomy; Appendectomy; Colonoscopy (05/22/2015); and Polypectomy.   Her family history includes Arthritis in her maternal grandmother; Cancer in her father and mother; Colon cancer (age of onset: 85) in her brother; Diabetes in her mother; Hyperlipidemia in her mother; Hypertension in her brother, mother, and sister; Kidney disease in her mother; Obesity in her mother; Prostate cancer in her father; Sleep apnea in her mother; Stroke in her brother; Thyroid disease in her mother.She reports that she quit smoking about 4 years ago. Her smoking use included cigarettes. She has never used smokeless tobacco. She reports that she does not drink alcohol or use drugs.  Current Outpatient Medications  on File Prior to Visit  Medication Sig Dispense Refill  . ARIPiprazole (ABILIFY) 2 MG tablet Take 2 mg by mouth daily.     . ARIPiprazole (ABILIFY) 5 MG tablet Take 1 tablet by mouth daily.  0  . atorvastatin (LIPITOR) 10 MG tablet TAKE 1 TABLET DAILY 90 tablet 3  . fluticasone (FLONASE) 50 MCG/ACT nasal spray USE 2 SPRAYS IN EACH NOSTRIL DAILY 48 g 3  . hydrochlorothiazide (HYDRODIURIL) 25 MG tablet TAKE 1 TABLET DAILY 90 tablet 1  . levocetirizine (XYZAL) 5 MG tablet TAKE 1 TABLET EVERY EVENING 90 tablet 0  . losartan (COZAAR) 50 MG tablet TAKE 1 TABLET DAILY 90 tablet 3  . metFORMIN (GLUCOPHAGE) 500 MG tablet Take 1 tablet (500 mg total) by mouth 2 (two) times daily with a meal. 60 tablet 0  . Vitamin D, Ergocalciferol, (DRISDOL) 1.25 MG (50000 UT) CAPS capsule Take 1 capsule (50,000 Units total) by mouth every 7 (seven) days. 4 capsule 0   No current facility-administered medications on file prior to visit.      Objective:  Objective  Physical Exam Vitals signs and nursing note reviewed.  Constitutional:      Appearance: She is well-developed.  HENT:     Head: Normocephalic and atraumatic.  Eyes:     Conjunctiva/sclera: Conjunctivae normal.  Neck:     Musculoskeletal: Normal range of motion and neck supple.     Thyroid: No thyromegaly.     Vascular: No carotid bruit or JVD.  Cardiovascular:     Rate and Rhythm: Normal rate and regular rhythm.     Heart sounds:  Normal heart sounds. No murmur.  Pulmonary:     Effort: Pulmonary effort is normal. No respiratory distress.     Breath sounds: Normal breath sounds. No wheezing or rales.  Chest:     Chest wall: No tenderness.  Musculoskeletal:        General: Tenderness present.     Left foot: Normal range of motion. Tenderness present. No swelling, crepitus or deformity.       Feet:  Neurological:     Mental Status: She is alert and oriented to person, place, and time.    BP 120/70 (BP Location: Right Arm, Patient Position:  Sitting, Cuff Size: Normal)   Pulse (!) 56   Temp 97.8 F (36.6 C) (Temporal)   Resp 18   Ht 5\' 6"  (1.676 m)   Wt 180 lb 9.6 oz (81.9 kg)   SpO2 96%   BMI 29.15 kg/m  Wt Readings from Last 3 Encounters:  09/05/19 180 lb 9.6 oz (81.9 kg)  08/30/19 179 lb (81.2 kg)  08/16/19 175 lb (79.4 kg)     Lab Results  Component Value Date   WBC 3.6 (L) 09/15/2017   HGB 12.7 09/15/2017   HCT 39.3 09/15/2017   PLT 349.0 09/15/2017   GLUCOSE 88 08/16/2019   CHOL 148 04/08/2019   TRIG 67 04/08/2019   HDL 48 04/08/2019   LDLDIRECT 140.5 08/12/2013   LDLCALC 87 04/08/2019   ALT 18 08/16/2019   AST 15 08/16/2019   NA 141 08/16/2019   K 4.1 08/16/2019   CL 103 08/16/2019   CREATININE 0.82 08/16/2019   BUN 20 08/16/2019   CO2 22 08/16/2019   TSH 2.530 04/08/2019   HGBA1C 5.1 08/16/2019   MICROALBUR <0.7 02/22/2015    Mm Digital Screening Bilateral  Result Date: 08/17/2017 CLINICAL DATA:  Screening. EXAM: DIGITAL SCREENING BILATERAL MAMMOGRAM WITH CAD COMPARISON:  Previous exam(s). ACR Breast Density Category c: The breast tissue is heterogeneously dense, which may obscure small masses. FINDINGS: There are no findings suspicious for malignancy. Images were processed with CAD. IMPRESSION: No mammographic evidence of malignancy. A result letter of this screening mammogram will be mailed directly to the patient. RECOMMENDATION: Screening mammogram in one year. (Code:SM-B-01Y) BI-RADS CATEGORY  1: Negative. Electronically Signed   By: Franki Cabot M.D.   On: 08/17/2017 11:32     Assessment & Plan:  Plan  I have discontinued Alicia Knapp's cholecalciferol. I have also changed her amLODipine. Additionally, I am having her maintain her ARIPiprazole, ARIPiprazole, atorvastatin, fluticasone, losartan, hydrochlorothiazide, levocetirizine, metFORMIN, Vitamin D (Ergocalciferol), and carvedilol.  Meds ordered this encounter  Medications  . carvedilol (COREG) 6.25 MG tablet    Sig: Take 1 tablet  (6.25 mg total) by mouth 2 (two) times daily with a meal.    Dispense:  180 tablet    Refill:  1  . amLODipine (NORVASC) 10 MG tablet    Sig: Take 1 tablet (10 mg total) by mouth daily.    Dispense:  90 tablet    Refill:  3    Problem List Items Addressed This Visit      Unprioritized   Essential hypertension    Well controlled, no changes to meds. Encouraged heart healthy diet such as the DASH diet and exercise as tolerated.        Relevant Medications   carvedilol (COREG) 6.25 MG tablet   amLODipine (NORVASC) 10 MG tablet   Great toe pain, left    Tylenol arthritis  Call or rto if  no better       Hyperlipidemia - Primary    Tolerating statin, encouraged heart healthy diet, avoid trans fats, minimize simple carbs and saturated fats. Increase exercise as tolerated      Relevant Medications   carvedilol (COREG) 6.25 MG tablet   amLODipine (NORVASC) 10 MG tablet      Follow-up: Return in about 6 months (around 03/04/2020), or if symptoms worsen or fail to improve, for hypertension, hyperlipidemia.  Ann Held, DO

## 2019-09-05 NOTE — Addendum Note (Signed)
Addended by: Roma Schanz R on: 09/05/2019 12:46 PM   Modules accepted: Level of Service

## 2019-09-05 NOTE — Patient Instructions (Signed)

## 2019-09-05 NOTE — Assessment & Plan Note (Signed)
Tylenol arthritis  Call or rto if no better

## 2019-09-05 NOTE — Assessment & Plan Note (Signed)
Tolerating statin, encouraged heart healthy diet, avoid trans fats, minimize simple carbs and saturated fats. Increase exercise as tolerated 

## 2019-09-20 ENCOUNTER — Encounter (INDEPENDENT_AMBULATORY_CARE_PROVIDER_SITE_OTHER): Payer: Self-pay | Admitting: Bariatrics

## 2019-09-20 ENCOUNTER — Ambulatory Visit (INDEPENDENT_AMBULATORY_CARE_PROVIDER_SITE_OTHER): Admitting: Bariatrics

## 2019-09-20 ENCOUNTER — Other Ambulatory Visit: Payer: Self-pay

## 2019-09-20 VITALS — BP 144/73 | HR 62 | Temp 98.5°F | Ht 66.0 in | Wt 175.0 lb

## 2019-09-20 DIAGNOSIS — Z9189 Other specified personal risk factors, not elsewhere classified: Secondary | ICD-10-CM

## 2019-09-20 DIAGNOSIS — E559 Vitamin D deficiency, unspecified: Secondary | ICD-10-CM | POA: Diagnosis not present

## 2019-09-20 DIAGNOSIS — E669 Obesity, unspecified: Secondary | ICD-10-CM

## 2019-09-20 DIAGNOSIS — E8881 Metabolic syndrome: Secondary | ICD-10-CM | POA: Diagnosis not present

## 2019-09-20 DIAGNOSIS — Z6830 Body mass index (BMI) 30.0-30.9, adult: Secondary | ICD-10-CM

## 2019-09-20 MED ORDER — METFORMIN HCL 500 MG PO TABS: 500.0000 mg | ORAL_TABLET | Freq: Two times a day (BID) | ORAL | 1 refills | Status: DC

## 2019-09-20 NOTE — Progress Notes (Signed)
Office: [phone]  /  Fax: [phone]   HPI:   Chief Complaint: OBESITY Alicia Knapp is here to discuss her progress with her obesity treatment plan. She is on the Category 1 plan and is following her eating plan approximately 90% of the time. She states she is doing weights 20 minutes 2 times per week and cardio 60 minutes 3 times per week. Alicia Knapp is down 4 lbs and doing well overall. Her goal weight is 168 lbs.  Her weight is 175 lb (79.4 kg) today and has had a weight loss of 4 pounds over a period of 3 weeks since her last visit. She has lost 37 lbs since starting treatment with [name].  Insulin Resistance Alicia Knapp has a diagnosis of insulin resistance based on her elevated fasting insulin level >5. Last A1c 5.1 on 08/16/2019 with an insulin of 10.4. Although Alicia Knapp's blood glucose readings are still under good control, insulin resistance puts her at greater risk of metabolic syndrome and diabetes. She is taking metformin currently and continues to work on diet and exercise to decrease risk of diabetes. No polyphagia.  At risk for diabetes Alicia Knapp is at higher than average risk for developing diabetes due to her obesity. She currently denies polyuria or polydipsia.  Vitamin D deficiency Alicia Knapp has a diagnosis of Vitamin D deficiency. Last Vitamin D 38.4 on 08/16/2019. She is currently taking Vit D and denies nausea, vomiting or muscle weakness.  ASSESSMENT AND PLAN:  Insulin resistance - Plan: metFORMIN (GLUCOPHAGE) 500 MG tablet  Vitamin D deficiency  At risk for diabetes mellitus  Class 1 obesity with serious comorbidity and body mass index (BMI) of 30.0 to 30.9 in adult, unspecified obesity type - Starting BMI greater then 30  PLAN:  Insulin Resistance Alicia Knapp will continue to work on weight loss, exercise, and decreasing simple carbohydrates in her diet to help decrease the risk of diabetes. We dicussed metformin including benefits and risks. She was informed that eating too many simple  carbohydrates or too many calories at one sitting increases the likelihood of GI side effects. Alicia Knapp was given a prescription for metformin 500 mg 1 BID #60 with 1 refill. She agrees to follow-up with our clinic in 2-3 weeks.  Diabetes risk counseling Alicia Knapp was given extended (15 minutes) diabetes prevention counseling today. She is 63 y.o. female and has risk factors for diabetes including obesity. We discussed intensive lifestyle modifications today with an emphasis on weight loss as well as increasing exercise and decreasing simple carbohydrates in her diet.  Vitamin D Deficiency Alicia Knapp was informed that low Vitamin D levels contributes to fatigue and are associated with obesity, breast, and colon cancer. She agrees to continue taking Vit D and will follow-up for routine testing of Vitamin D, at least 2-3 times per year. She was informed of the risk of over-replacement of Vitamin D and agrees to not increase her dose unless she discusses this with [name] first. Alicia Knapp agrees to follow-up with our clinic in 2-3 weeks.  Obesity Alicia Knapp is currently in the action stage of change. As such, her goal is to continue with weight loss efforts. She has agreed to follow the Category 1 plan. Alicia Knapp will work on [employer]. She was given Thanksgiving handout. Alicia Knapp has been instructed to continue her current exercise regimen for weight loss and overall health benefits. We discussed the following Behavioral Modification Strategies today: increasing lean protein intake, decreasing simple carbohydrates, increasing vegetables, increase H20 intake, ways to avoid night time snacking, holiday eating strategies,  celebration eating strategies, and planning for success.  Alicia Knapp has agreed to follow-up with our clinic in 2-3 weeks. She was informed of the importance of frequent follow-up visits to maximize her success with intensive lifestyle modifications for her multiple health conditions.  ALLERGIES: Allergies  Allergen  Reactions  . Erythromycin     Causes stomach cramps  . Penicillins     rash    MEDICATIONS: Current Outpatient Medications on File Prior to Visit  Medication Sig Dispense Refill  . amLODipine (NORVASC) 10 MG tablet Take 1 tablet (10 mg total) by mouth daily. 90 tablet 3  . ARIPiprazole (ABILIFY) 2 MG tablet Take 2 mg by mouth daily.     . ARIPiprazole (ABILIFY) 5 MG tablet Take 1 tablet by mouth daily.  0  . atorvastatin (LIPITOR) 10 MG tablet TAKE 1 TABLET DAILY 90 tablet 3  . carvedilol (COREG) 6.25 MG tablet Take 1 tablet (6.25 mg total) by mouth 2 (two) times daily with a meal. 180 tablet 1  . fluticasone (FLONASE) 50 MCG/ACT nasal spray USE 2 SPRAYS IN EACH NOSTRIL DAILY 48 g 3  . hydrochlorothiazide (HYDRODIURIL) 25 MG tablet TAKE 1 TABLET DAILY 90 tablet 1  . levocetirizine (XYZAL) 5 MG tablet TAKE 1 TABLET EVERY EVENING 90 tablet 0  . losartan (COZAAR) 50 MG tablet TAKE 1 TABLET DAILY 90 tablet 3  . Vitamin D, Ergocalciferol, (DRISDOL) 1.25 MG (50000 UT) CAPS capsule Take 1 capsule (50,000 Units total) by mouth every 7 (seven) days. 4 capsule 0   No current facility-administered medications on file prior to visit.     PAST MEDICAL HISTORY: Past Medical History:  Diagnosis Date  . Anxiety   . B12 deficiency   . Constipation   . Heart murmur   . Hyperlipidemia   . Hypertension   . Joint pain   . Postoperative nausea   . Prediabetes   . Swelling    lower ext  . Vitamin D deficiency     PAST SURGICAL HISTORY: Past Surgical History:  Procedure Laterality Date  . ABDOMINAL HYSTERECTOMY    . APPENDECTOMY    . COLONOSCOPY  05/22/2015   Dr.Pyrtle  . POLYPECTOMY      SOCIAL HISTORY: Social History   Tobacco Use  . Smoking status: Former Smoker    Types: Cigarettes    Quit date: 09/17/2014    Years since quitting: 5.0  . Smokeless tobacco: Never Used  Substance Use Topics  . Alcohol use: No    Alcohol/week: 0.0 standard drinks  . Drug use: No    FAMILY  HISTORY: Family History  Problem Relation Age of Onset  . Cancer Father        prostate  . Prostate cancer Father   . Diabetes Mother   . Thyroid disease Mother   . Hypertension Mother   . Hyperlipidemia Mother   . Kidney disease Mother   . Cancer Mother   . Sleep apnea Mother   . Obesity Mother   . Hypertension Sister   . Hypertension Brother   . Stroke Brother   . Colon cancer Brother 38       died 27 - 4-5 months after dx  . Arthritis Maternal Grandmother   . Esophageal cancer Neg Hx   . Rectal cancer Neg Hx   . Stomach cancer Neg Hx    ROS: Review of Systems  Gastrointestinal: Negative for nausea and vomiting.  Musculoskeletal:       Negative for muscle weakness.  Endo/Heme/Allergies:       Negative for polyphagia.   PHYSICAL EXAM: Blood pressure (!) 144/73, pulse 62, temperature 98.5 F (36.9 C), height 5\' 6"  (1.676 m), weight 175 lb (79.4 kg), SpO2 100 %. Body mass index is 28.25 kg/m. Physical Exam Vitals signs reviewed.  Constitutional:      Appearance: Normal appearance. She is obese.  Cardiovascular:     Rate and Rhythm: Normal rate.     Pulses: Normal pulses.  Pulmonary:     Effort: Pulmonary effort is normal.     Breath sounds: Normal breath sounds.  Musculoskeletal: Normal range of motion.  Skin:    General: Skin is warm and dry.  Neurological:     Mental Status: She is alert and oriented to person, place, and time.  Psychiatric:        Behavior: Behavior normal.   RECENT LABS AND TESTS: BMET    Component Value Date/Time   NA 141 08/16/2019 0905   K 4.1 08/16/2019 0905   CL 103 08/16/2019 0905   CO2 22 08/16/2019 0905   GLUCOSE 88 08/16/2019 0905   GLUCOSE 98 05/18/2018 1801   BUN 20 08/16/2019 0905   CREATININE 0.82 08/16/2019 0905   CALCIUM 10.0 08/16/2019 0905   GFRNONAA 77 08/16/2019 0905   GFRAA 89 08/16/2019 0905   Lab Results  Component Value Date   HGBA1C 5.1 08/16/2019   HGBA1C 5.4 04/08/2019   HGBA1C 5.5 11/09/2018    HGBA1C 5.7 05/18/2018   HGBA1C 5.6 12/21/2017   Lab Results  Component Value Date   INSULIN 10.4 08/16/2019   INSULIN 20.3 04/08/2019   INSULIN 13.8 11/09/2018   INSULIN 15.4 08/03/2018   CBC    Component Value Date/Time   WBC 3.6 (L) 09/15/2017 1153   RBC 4.43 09/15/2017 1153   HGB 12.7 09/15/2017 1153   HCT 39.3 09/15/2017 1153   PLT 349.0 09/15/2017 1153   MCV 88.7 09/15/2017 1153   MCHC 32.3 09/15/2017 1153   RDW 14.3 09/15/2017 1153   LYMPHSABS 1.6 09/15/2017 1153   MONOABS 0.3 09/15/2017 1153   EOSABS 0.1 09/15/2017 1153   BASOSABS 0.0 09/15/2017 1153   Iron/TIBC/Ferritin/ %Sat No results found for: IRON, TIBC, FERRITIN, IRONPCTSAT Lipid Panel     Component Value Date/Time   CHOL 148 04/08/2019 0826   TRIG 67 04/08/2019 0826   HDL 48 04/08/2019 0826   CHOLHDL 3 05/18/2018 1801   VLDL 21.2 05/18/2018 1801   LDLCALC 87 04/08/2019 0826   LDLDIRECT 140.5 08/12/2013 0913   Hepatic Function Panel     Component Value Date/Time   PROT 7.7 08/16/2019 0905   ALBUMIN 4.9 (H) 08/16/2019 0905   AST 15 08/16/2019 0905   ALT 18 08/16/2019 0905   ALKPHOS 80 08/16/2019 0905   BILITOT 0.4 08/16/2019 0905   BILIDIR 0.0 02/22/2015 1206      Component Value Date/Time   TSH 2.530 04/08/2019 0826   TSH 1.760 08/03/2018 1324   TSH 1.81 09/15/2017 1153   Results for DIYA, STANDERFER (MRN [id number]) as of 09/20/2019 16:00  Ref. Range 08/16/2019 09:05  Vitamin D, 25-Hydroxy Latest Ref Range: 30.0 - 100.0 ng/mL 38.4   OBESITY BEHAVIORAL INTERVENTION VISIT  Today's visit was #25  Starting weight: 212 lbs Starting date: 08/03/2018 Today's weight: 175 lbs  Today's date: 09/20/2019 Total lbs lost to date: 37     09/20/2019  Height 5\' 6"  (1.676 m)  Weight 175 lb (79.4 kg)  BMI (Calculated) 28.26  BLOOD PRESSURE -  SYSTOLIC [id number]  BLOOD PRESSURE - DIASTOLIC 73   Body Fat % 0000000 %  Total Body Water (lbs) 67.6 lbs   ASK: We discussed the diagnosis of obesity with Alicia Knapp today and Alicia Knapp agreed to give [name] permission to discuss obesity behavioral modification therapy today.  ASSESS: Alicia Knapp has the diagnosis of obesity and her BMI today is 28.2. Terell is in the action stage of change.   ADVISE: Emillia was educated on the multiple health risks of obesity as well as the benefit of weight loss to improve her health. She was advised of the need for long term treatment and the importance of lifestyle modifications to improve her current health and to decrease her risk of future health problems.  AGREE: Multiple dietary modification options and treatment options were discussed and  Alicia Knapp agreed to follow the recommendations documented in the above note.  ARRANGE: Chancie was educated on the importance of frequent visits to treat obesity as outlined per CMS and USPSTF guidelines and agreed to schedule her next follow up appointment today.  Alicia Knapp, am acting as Location manager for [employer], DO  I have reviewed the above documentation for accuracy and completeness, and I agree with the above. -Jearld Lesch, DO

## 2019-09-21 ENCOUNTER — Encounter (INDEPENDENT_AMBULATORY_CARE_PROVIDER_SITE_OTHER): Payer: Self-pay | Admitting: Bariatrics

## 2019-09-25 ENCOUNTER — Other Ambulatory Visit (INDEPENDENT_AMBULATORY_CARE_PROVIDER_SITE_OTHER): Payer: Self-pay | Admitting: Bariatrics

## 2019-09-25 DIAGNOSIS — E559 Vitamin D deficiency, unspecified: Secondary | ICD-10-CM

## 2019-10-11 ENCOUNTER — Encounter (INDEPENDENT_AMBULATORY_CARE_PROVIDER_SITE_OTHER): Payer: Self-pay | Admitting: Bariatrics

## 2019-10-11 ENCOUNTER — Other Ambulatory Visit: Payer: Self-pay

## 2019-10-11 ENCOUNTER — Ambulatory Visit (INDEPENDENT_AMBULATORY_CARE_PROVIDER_SITE_OTHER): Admitting: Bariatrics

## 2019-10-11 VITALS — BP 143/74 | HR 65 | Temp 98.3°F | Ht 66.0 in | Wt 175.0 lb

## 2019-10-11 DIAGNOSIS — Z6830 Body mass index (BMI) 30.0-30.9, adult: Secondary | ICD-10-CM

## 2019-10-11 DIAGNOSIS — Z9189 Other specified personal risk factors, not elsewhere classified: Secondary | ICD-10-CM

## 2019-10-11 DIAGNOSIS — E8881 Metabolic syndrome: Secondary | ICD-10-CM | POA: Diagnosis not present

## 2019-10-11 DIAGNOSIS — E88819 Insulin resistance, unspecified: Secondary | ICD-10-CM

## 2019-10-11 DIAGNOSIS — E669 Obesity, unspecified: Secondary | ICD-10-CM

## 2019-10-11 DIAGNOSIS — I10 Essential (primary) hypertension: Secondary | ICD-10-CM | POA: Diagnosis not present

## 2019-10-11 DIAGNOSIS — E66811 Obesity, class 1: Secondary | ICD-10-CM

## 2019-10-11 MED ORDER — METFORMIN HCL 500 MG PO TABS: 500.0000 mg | ORAL_TABLET | Freq: Two times a day (BID) | ORAL | 0 refills | Status: DC

## 2019-10-11 NOTE — Progress Notes (Signed)
Office: [phone]  /  Fax: [phone]   HPI:   Chief Complaint: OBESITY Alicia Knapp is here to discuss her progress with her obesity treatment plan. She is on the Category 1 plan and is following her eating plan approximately 85% of the time. She states she is doing strengthening exercises and weights 25-30 minutes 2 times per week and doing cardio 60 minutes 3 times per week. Alicia Knapp weight has remained the same. Her goal weight is 168 lbs. Her weight is 175 lb (79.4 kg) today and has not lost weight since her last visit. She has lost 37 lbs since starting treatment with [name].  Insulin Resistance (Mild) Alicia Knapp has a diagnosis of insulin resistance based on her elevated fasting insulin level >5. Last A1c 5.1 on 08/16/2019 with an insulin of 10.4. Although Alicia Knapp's blood glucose readings are still under good control, insulin resistance puts her at greater risk of metabolic syndrome and diabetes. No polyphagia.  At risk for diabetes Alicia Knapp is at higher than average risk for developing diabetes due to her obesity.   Hypertension Alicia Knapp is a 63 y.o. female with hypertension and is taking medications. Alicia Knapp denies chest pain or shortness of breath on exertion. She is working weight loss to help control her blood pressure with the goal of decreasing her risk of heart attack and stroke. Alicia Knapp's blood pressure is 143/74.  ASSESSMENT AND PLAN:  Insulin resistance - Plan: metFORMIN (GLUCOPHAGE) 500 MG tablet  Essential hypertension  At risk for diabetes mellitus  Class 1 obesity with serious comorbidity and body mass index (BMI) of 30.0 to 30.9 in adult, unspecified obesity type - Starting BMI greater then 30  PLAN:  Insulin Resistance (Mild) Alicia Knapp will continue to work on weight loss, exercise, and decreasing simple carbohydrates to help decrease the risk of diabetes. Alicia Knapp was given a prescription for metformin 500 mg 1 BID #180 with 0 refills. She agrees to follow-up with our clinic in 2-3  weeks.  Diabetes risk counseling (~15 min) Alicia Knapp is a 63 y.o. female and has risk factors for diabetes including obesity. We discussed intensive lifestyle modifications today with an emphasis on weight loss as well as increasing exercise and decreasing simple carbohydrates in her diet.   Hypertension Alicia Knapp is working on healthy weight loss and exercise to improve blood pressure control. She will continue her medications and watch for signs of hypotension as she continues her lifestyle modifications.  Obesity Alicia Knapp is currently in the action stage of change. As such, her goal is to continue with weight loss efforts. She has agreed to follow the Category 1 plan. Alicia Knapp will increase raw vegetables and work on meal planning. Alicia Knapp has been instructed to continue doing sit-ups, weight training and cardio for weight loss and overall health benefits. We discussed the following Behavioral Modification Strategies today: increasing lean protein intake, decreasing simple carbohydrates, increasing vegetables, increase H20 intake, decrease eating out, no skipping meals, work on meal planning and easy cooking plans, keeping healthy foods in the home, and planning for success.  Alicia Knapp has agreed to follow-up with our clinic in 2-3 weeks. She was informed of the importance of frequent follow-up visits to maximize her success with intensive lifestyle modifications for her multiple health conditions.  ALLERGIES: Allergies  Allergen Reactions  . Erythromycin     Causes stomach cramps  . Penicillins     rash    MEDICATIONS: Current Outpatient Medications on File Prior to Visit  Medication Sig Dispense Refill  . amLODipine (NORVASC)  10 MG tablet Take 1 tablet (10 mg total) by mouth daily. 90 tablet 3  . ARIPiprazole (ABILIFY) 2 MG tablet Take 2 mg by mouth daily.     . ARIPiprazole (ABILIFY) 5 MG tablet Take 1 tablet by mouth daily.  0  . atorvastatin (LIPITOR) 10 MG tablet TAKE 1 TABLET DAILY 90 tablet 3  .  carvedilol (COREG) 6.25 MG tablet Take 1 tablet (6.25 mg total) by mouth 2 (two) times daily with a meal. 180 tablet 1  . fluticasone (FLONASE) 50 MCG/ACT nasal spray USE 2 SPRAYS IN EACH NOSTRIL DAILY 48 g 3  . hydrochlorothiazide (HYDRODIURIL) 25 MG tablet TAKE 1 TABLET DAILY 90 tablet 1  . levocetirizine (XYZAL) 5 MG tablet TAKE 1 TABLET EVERY EVENING 90 tablet 0  . losartan (COZAAR) 50 MG tablet TAKE 1 TABLET DAILY 90 tablet 3  . Vitamin D, Ergocalciferol, (DRISDOL) 1.25 MG (50000 UT) CAPS capsule TAKE 1 CAPSULE EVERY 7 DAYS 4 capsule 12   No current facility-administered medications on file prior to visit.     PAST MEDICAL HISTORY: Past Medical History:  Diagnosis Date  . Anxiety   . B12 deficiency   . Constipation   . Heart murmur   . Hyperlipidemia   . Hypertension   . Joint pain   . Postoperative nausea   . Prediabetes   . Swelling    lower ext  . Vitamin D deficiency     PAST SURGICAL HISTORY: Past Surgical History:  Procedure Laterality Date  . ABDOMINAL HYSTERECTOMY    . APPENDECTOMY    . COLONOSCOPY  05/22/2015   Dr.Pyrtle  . POLYPECTOMY      SOCIAL HISTORY: Social History   Tobacco Use  . Smoking status: Former Smoker    Types: Cigarettes    Quit date: 09/17/2014    Years since quitting: 5.0  . Smokeless tobacco: Never Used  Substance Use Topics  . Alcohol use: No    Alcohol/week: 0.0 standard drinks  . Drug use: No    FAMILY HISTORY: Family History  Problem Relation Age of Onset  . Cancer Father        prostate  . Prostate cancer Father   . Diabetes Mother   . Thyroid disease Mother   . Hypertension Mother   . Hyperlipidemia Mother   . Kidney disease Mother   . Cancer Mother   . Sleep apnea Mother   . Obesity Mother   . Hypertension Sister   . Hypertension Brother   . Stroke Brother   . Colon cancer Brother 34       died 2 - 4-5 months after dx  . Arthritis Maternal Grandmother   . Esophageal cancer Neg Hx   . Rectal cancer Neg  Hx   . Stomach cancer Neg Hx    ROS: Review of Systems  Respiratory: Negative for shortness of breath.   Cardiovascular: Negative for chest pain.  Endo/Heme/Allergies:       Negative for polyphagia.   PHYSICAL EXAM: Blood pressure (!) 143/74, pulse 65, temperature 98.3 F (36.8 C), height 5\' 6"  (1.676 m), weight 175 lb (79.4 kg), SpO2 100 %. Body mass index is 28.25 kg/m. Physical Exam Vitals signs reviewed.  Constitutional:      Appearance: Normal appearance. She is obese.  Cardiovascular:     Rate and Rhythm: Normal rate.     Pulses: Normal pulses.  Pulmonary:     Effort: Pulmonary effort is normal.     Breath sounds: Normal  breath sounds.  Musculoskeletal: Normal range of motion.  Skin:    General: Skin is warm and dry.  Neurological:     Mental Status: She is alert and oriented to person, place, and time.  Psychiatric:        Behavior: Behavior normal.   RECENT LABS AND TESTS: BMET    Component Value Date/Time   NA 141 08/16/2019 0905   K 4.1 08/16/2019 0905   CL 103 08/16/2019 0905   CO2 22 08/16/2019 0905   GLUCOSE 88 08/16/2019 0905   GLUCOSE 98 05/18/2018 1801   BUN 20 08/16/2019 0905   CREATININE 0.82 08/16/2019 0905   CALCIUM 10.0 08/16/2019 0905   GFRNONAA 77 08/16/2019 0905   GFRAA 89 08/16/2019 0905   Lab Results  Component Value Date   HGBA1C 5.1 08/16/2019   HGBA1C 5.4 04/08/2019   HGBA1C 5.5 11/09/2018   HGBA1C 5.7 05/18/2018   HGBA1C 5.6 12/21/2017   Lab Results  Component Value Date   INSULIN 10.4 08/16/2019   INSULIN 20.3 04/08/2019   INSULIN 13.8 11/09/2018   INSULIN 15.4 08/03/2018   CBC    Component Value Date/Time   WBC 3.6 (L) 09/15/2017 1153   RBC 4.43 09/15/2017 1153   HGB 12.7 09/15/2017 1153   HCT 39.3 09/15/2017 1153   PLT 349.0 09/15/2017 1153   MCV 88.7 09/15/2017 1153   MCHC 32.3 09/15/2017 1153   RDW 14.3 09/15/2017 1153   LYMPHSABS 1.6 09/15/2017 1153   MONOABS 0.3 09/15/2017 1153   EOSABS 0.1 09/15/2017  1153   BASOSABS 0.0 09/15/2017 1153   Iron/TIBC/Ferritin/ %Sat No results found for: IRON, TIBC, FERRITIN, IRONPCTSAT Lipid Panel     Component Value Date/Time   CHOL 148 04/08/2019 0826   TRIG 67 04/08/2019 0826   HDL 48 04/08/2019 0826   CHOLHDL 3 05/18/2018 1801   VLDL 21.2 05/18/2018 1801   LDLCALC 87 04/08/2019 0826   LDLDIRECT 140.5 08/12/2013 0913   Hepatic Function Panel     Component Value Date/Time   PROT 7.7 08/16/2019 0905   ALBUMIN 4.9 (H) 08/16/2019 0905   AST 15 08/16/2019 0905   ALT 18 08/16/2019 0905   ALKPHOS 80 08/16/2019 0905   BILITOT 0.4 08/16/2019 0905   BILIDIR 0.0 02/22/2015 1206      Component Value Date/Time   TSH 2.530 04/08/2019 0826   TSH 1.760 08/03/2018 1324   TSH 1.81 09/15/2017 1153   Results for SAMYRIA, MCKEITHEN (MRN [id number]) as of 10/11/2019 14:49  Ref. Range 08/16/2019 09:05  Vitamin D, 25-Hydroxy Latest Ref Range: 30.0 - 100.0 ng/mL 38.4   OBESITY BEHAVIORAL INTERVENTION VISIT  Today's visit was #26  Starting weight: 212 lbs Starting date: 11/03/2017 Today's weight: 175 lbs  Today's date: 10/11/2019 Total lbs lost to date: 37     10/11/2019  Height 5\' 6"  (1.676 m)  Weight 175 lb (79.4 kg)  BMI (Calculated) 28.26  BLOOD PRESSURE - SYSTOLIC [id number]  BLOOD PRESSURE - DIASTOLIC 74   Body Fat % [id number] %  Total Body Water (lbs) 66.6 lbs   ASK: We discussed the diagnosis of obesity with Alicia Knapp today and Alicia Knapp agreed to give [name] permission to discuss obesity behavioral modification therapy today.  ASSESS: Alicia Knapp has the diagnosis of obesity and her BMI today is 28.3. Alicia Knapp is in the action stage of change.   ADVISE: Alicia Knapp was educated on the multiple health risks of obesity as well as the benefit of weight loss to improve her health. She  was advised of the need for long term treatment and the importance of lifestyle modifications to improve her current health and to decrease her risk of future health problems.  AGREE: Multiple  dietary modification options and treatment options were discussed and  Alicia Knapp agreed to follow the recommendations documented in the above note.  ARRANGE: Alicia Knapp was educated on the importance of frequent visits to treat obesity as outlined per CMS and USPSTF guidelines and agreed to schedule her next follow up appointment today.  Alicia Knapp, am acting as Location manager for [employer], DO  I have reviewed the above documentation for accuracy and completeness, and I agree with the above. -Jearld Lesch, DO

## 2019-11-07 ENCOUNTER — Encounter (INDEPENDENT_AMBULATORY_CARE_PROVIDER_SITE_OTHER): Payer: Self-pay | Admitting: Bariatrics

## 2019-11-07 ENCOUNTER — Ambulatory Visit (INDEPENDENT_AMBULATORY_CARE_PROVIDER_SITE_OTHER): Admitting: Bariatrics

## 2019-11-07 ENCOUNTER — Other Ambulatory Visit: Payer: Self-pay

## 2019-11-07 VITALS — BP 136/80 | HR 65 | Temp 98.9°F | Ht 66.0 in | Wt 173.0 lb

## 2019-11-07 DIAGNOSIS — I10 Essential (primary) hypertension: Secondary | ICD-10-CM

## 2019-11-07 DIAGNOSIS — E559 Vitamin D deficiency, unspecified: Secondary | ICD-10-CM

## 2019-11-07 DIAGNOSIS — Z6830 Body mass index (BMI) 30.0-30.9, adult: Secondary | ICD-10-CM

## 2019-11-07 DIAGNOSIS — E669 Obesity, unspecified: Secondary | ICD-10-CM

## 2019-11-07 NOTE — Progress Notes (Signed)
Chief Complaint: OBESITY Alicia Knapp is here to discuss her progress with her obesity treatment plan. Alicia Knapp is on the Category 1 Plan and states she is following her eating plan approximately 80-85% of the time. Alicia Knapp states she is doing weights 30 minutes 2 times per week and cardio 60 minutes 3 times per week.  Today's visit was #: 42 Starting weight: 212 lbs Starting date: 11/03/2017 Today's weight: 173 lbs Today's date: 11/07/2019 Total lbs lost to date: 39  Total lbs lost since last in-office visit: 2  Subjective:   Interim History: Alicia Knapp is down 2 lbs since her last visit and has done very well overall. Her goal is to lose 5 more lbs and then maintain. She reports her appetite is normal.  General review of systems is unchanged or negative.   Assessment/Plan:   1. Essential hypertension, which is well controlled. She will continue her medications and will not add salt to her diet.   2. Vitamin D deficiency. Last Vitamin D level 38.4 on 08/16/2019. We reviewed her lab results (Vitamin D). She will continue Vitamin D as prescribed.   3. Class 1 obesity with serious comorbidity and body mass index (BMI) of 30.0 to 30.9 in adult, unspecified obesity type    TIME SPENT: 20 minutes  Alicia Knapp is currently in the action stage of change. As such, her goal is to continue with weight loss efforts.  She has agreed to follow the Category 1 Plan.  She will work on meal planning, will eat slow and put fork down, will leave some food on the plate, will increase her protein and water intake, and will eat increase raw vegetables in her diet.   We discussed the following exercise goals today: For substantial health benefits, adults should do at least 150 minutes (2 hours and 30 minutes) a week of moderate-intensity, or 75 minutes (1 hour and 15 minutes) a week of vigorous-intensity aerobic physical activity, or an equivalent combination of moderate- and vigorous-intensity aerobic activity. Aerobic  activity should be performed in episodes of at least 10 minutes, and preferably, it should be spread throughout the week. Adults should also include muscle-strengthening activities that involve all major muscle groups on 2 or more days a week..   We discussed the following behavioral modification strategies today: increasing lean protein intake, decreasing simple carbohydrates, increasing vegetables, increasing water intake, decreasing eating out, no skipping meals, meal planning and cooking strategies, keeping healthy foods in the home and planning for success.  Alicia Knapp has agreed to follow-up with our clinic in 2 weeks. She was informed of the importance of frequent follow-up visits to maximize her success with intensive lifestyle modifications for her multiple health conditions.  Objective:   Blood pressure 136/80, pulse 65, temperature 98.9 F (37.2 C), height 5\' 6"  (1.676 m), weight 173 lb (78.5 kg), SpO2 100 %. Body mass index is 27.92 kg/m.  General: Cooperative, alert, well developed, in no acute distress. HEENT: Conjunctivae and lids unremarkable. Neck: No thyromegaly.  Cardiovascular: Regular rhythm.  Lungs: Normal work of breathing. Extremities: No edema.  Neurologic: No focal deficits.   Lab Results  Component Value Date   CREATININE 0.82 08/16/2019   BUN 20 08/16/2019   NA 141 08/16/2019   K 4.1 08/16/2019   CL 103 08/16/2019   CO2 22 08/16/2019   Lab Results  Component Value Date   ALT 18 08/16/2019   AST 15 08/16/2019   ALKPHOS 80 08/16/2019   BILITOT 0.4 08/16/2019  Lab Results  Component Value Date   HGBA1C 5.1 08/16/2019   HGBA1C 5.4 04/08/2019   HGBA1C 5.5 11/09/2018   HGBA1C 5.7 05/18/2018   HGBA1C 5.6 12/21/2017   Lab Results  Component Value Date   INSULIN 10.4 08/16/2019   INSULIN 20.3 04/08/2019   INSULIN 13.8 11/09/2018   INSULIN 15.4 08/03/2018   Lab Results  Component Value Date   TSH 2.530 04/08/2019   Lab Results  Component  Value Date   CHOL 148 04/08/2019   HDL 48 04/08/2019   LDLCALC 87 04/08/2019   LDLDIRECT 140.5 08/12/2013   TRIG 67 04/08/2019   CHOLHDL 3 05/18/2018   Lab Results  Component Value Date   WBC 3.6 (L) 09/15/2017   HGB 12.7 09/15/2017   HCT 39.3 09/15/2017   MCV 88.7 09/15/2017   PLT 349.0 09/15/2017   No results found for: IRON, TIBC, FERRITIN    Attestation Statements:   Reviewed by clinician on day of visit: allergies, medications, problem list, medical history, surgical history, family history, social history and previous encounter notes.  This visit occurred during the SARS-CoV-2 public health emergency.  Safety protocols were in place, including screening questions prior to the visit, additional usage of staff PPE, and extensive cleaning of exam room while observing appropriate contact time as indicated for disinfecting solutions. (CPT [id number])  I, Michaelene Song, am acting as transcriptionist for [employer], DO  I have reviewed the above documentation for accuracy and completeness, and I agree with the above. Jearld Lesch, DO

## 2019-11-09 ENCOUNTER — Encounter (INDEPENDENT_AMBULATORY_CARE_PROVIDER_SITE_OTHER): Payer: Self-pay | Admitting: Bariatrics

## 2019-11-12 ENCOUNTER — Other Ambulatory Visit: Payer: Self-pay | Admitting: Family Medicine

## 2019-11-22 ENCOUNTER — Ambulatory Visit (INDEPENDENT_AMBULATORY_CARE_PROVIDER_SITE_OTHER): Admitting: Bariatrics

## 2019-11-22 ENCOUNTER — Encounter (INDEPENDENT_AMBULATORY_CARE_PROVIDER_SITE_OTHER): Payer: Self-pay | Admitting: Bariatrics

## 2019-11-22 ENCOUNTER — Other Ambulatory Visit: Payer: Self-pay

## 2019-11-22 VITALS — BP 119/71 | HR 70 | Temp 98.3°F | Ht 66.0 in | Wt 176.0 lb

## 2019-11-22 DIAGNOSIS — E7849 Other hyperlipidemia: Secondary | ICD-10-CM

## 2019-11-22 DIAGNOSIS — Z6830 Body mass index (BMI) 30.0-30.9, adult: Secondary | ICD-10-CM

## 2019-11-22 DIAGNOSIS — E669 Obesity, unspecified: Secondary | ICD-10-CM | POA: Diagnosis not present

## 2019-11-22 DIAGNOSIS — I10 Essential (primary) hypertension: Secondary | ICD-10-CM | POA: Diagnosis not present

## 2019-11-22 NOTE — Progress Notes (Signed)
Chief Complaint:   OBESITY Alicia Knapp is here to discuss her progress with her obesity treatment plan along with follow-up of her obesity related diagnoses. Alicia Knapp is on the Category 1 Plan and states she is following her eating plan approximately 80% of the time. Alicia Knapp states she is working with weights 30 minutes 2 times per week and doing cardio 60 minutes 3 times per week.  Today's visit was #: 28 Starting weight: 212 lbs Starting date: 11/03/2017 Today's weight: 176 lbs Today's date: 11/22/2019 Total lbs lost to date: 36  Total lbs lost since last in-office visit: 0  Interim History: Alicia Knapp's weight is up 3 lbs. She is doing well with her water. Her goal weight is 168-173 lbs.  Subjective:   Essential hypertension. This is well controlled. She is taking Norvasc, Coreg, and Cozaar.  BP Readings from Last 3 Encounters:  11/22/19 119/71  11/07/19 136/80  10/11/19 (!) 143/74   Lab Results  Component Value Date   CREATININE 0.82 08/16/2019   CREATININE 0.90 04/08/2019   CREATININE 0.84 11/09/2018   Other hyperlipidemia. Alicia Knapp has hyperlipidemia and has been trying to improve her cholesterol levels with intensive lifestyle modification including a low saturated fat diet, exercise and weight loss. She denies any chest pain, claudication or myalgias. She is on no medications.  Lab Results  Component Value Date   ALT 18 08/16/2019   AST 15 08/16/2019   ALKPHOS 80 08/16/2019   BILITOT 0.4 08/16/2019   Lab Results  Component Value Date   CHOL 148 04/08/2019   HDL 48 04/08/2019   LDLCALC 87 04/08/2019   LDLDIRECT 140.5 08/12/2013   TRIG 67 04/08/2019   CHOLHDL 3 05/18/2018   Assessment/Plan:   Essential hypertension. Alicia Knapp is working on healthy weight loss and exercise to improve blood pressure control. We will watch for signs of hypotension as she continues her lifestyle modifications. She will continue her medications.  Other hyperlipidemia. Cardiovascular risk and  specific lipid/LDL goals reviewed.  We discussed several lifestyle modifications today and Alicia Knapp will continue to work on diet, exercise and weight loss efforts. Orders and follow up as documented in patient record. She will decrease saturated fats (read labels), increase MUFA's and PUFA's.  Counseling Intensive lifestyle modifications are the first line treatment for this issue. . Dietary changes: Increase soluble fiber. Decrease simple carbohydrates. . Exercise changes: Moderate to vigorous-intensity aerobic activity 150 minutes per week if tolerated. Lipid-lowering medications: see documented in medical record.  Class 1 obesity with serious comorbidity and body mass index (BMI) of 30.0 to 30.9 in adult, unspecified obesity type.  Alicia Knapp is currently in the action stage of change. As such, her goal is to continue with weight loss efforts. She has agreed to the Category 1 Plan.   She will work on meal planning, intentional eating, and will stay adherent to the plan.  Exercise goals: Alicia Knapp will work with weights 2 times per week and do cardio and recumbent bike 3 times per week.  Behavioral modification strategies: increasing lean protein intake, decreasing simple carbohydrates, increasing vegetables, increasing water intake, decreasing eating out, no skipping meals, meal planning and cooking strategies, keeping healthy foods in the home and planning for success.  Alicia Knapp has agreed to follow-up with our clinic in 2 weeks. She was informed of the importance of frequent follow-up visits to maximize her success with intensive lifestyle modifications for her multiple health conditions.   Objective:   Blood pressure 119/71, pulse 70, temperature 98.3 F (  36.8 C), temperature source Oral, height 5\' 6"  (1.676 m), weight 176 lb (79.8 kg), SpO2 100 %. Body mass index is 28.41 kg/m.  General: Cooperative, alert, well developed, in no acute distress. HEENT: Conjunctivae and lids  unremarkable. Cardiovascular: Regular rhythm.  Lungs: Normal work of breathing. Neurologic: No focal deficits.   Lab Results  Component Value Date   CREATININE 0.82 08/16/2019   BUN 20 08/16/2019   NA 141 08/16/2019   K 4.1 08/16/2019   CL 103 08/16/2019   CO2 22 08/16/2019   Lab Results  Component Value Date   ALT 18 08/16/2019   AST 15 08/16/2019   ALKPHOS 80 08/16/2019   BILITOT 0.4 08/16/2019   Lab Results  Component Value Date   HGBA1C 5.1 08/16/2019   HGBA1C 5.4 04/08/2019   HGBA1C 5.5 11/09/2018   HGBA1C 5.7 05/18/2018   HGBA1C 5.6 12/21/2017   Lab Results  Component Value Date   INSULIN 10.4 08/16/2019   INSULIN 20.3 04/08/2019   INSULIN 13.8 11/09/2018   INSULIN 15.4 08/03/2018   Lab Results  Component Value Date   TSH 2.530 04/08/2019   Lab Results  Component Value Date   CHOL 148 04/08/2019   HDL 48 04/08/2019   LDLCALC 87 04/08/2019   LDLDIRECT 140.5 08/12/2013   TRIG 67 04/08/2019   CHOLHDL 3 05/18/2018   Lab Results  Component Value Date   WBC 3.6 (L) 09/15/2017   HGB 12.7 09/15/2017   HCT 39.3 09/15/2017   MCV 88.7 09/15/2017   PLT 349.0 09/15/2017   No results found for: IRON, TIBC, FERRITIN  Attestation Statements:   Reviewed by clinician on day of visit: allergies, medications, problem list, medical history, surgical history, family history, social history, and previous encounter notes.  Time spent on visit including pre-visit chart review and post-visit care was 20 minutes.   Migdalia Dk, am acting as Location manager for [employer], DO   I have reviewed the above documentation for accuracy and completeness, and I agree with the above. Jearld Lesch, DO

## 2019-11-23 ENCOUNTER — Encounter (INDEPENDENT_AMBULATORY_CARE_PROVIDER_SITE_OTHER): Payer: Self-pay | Admitting: Bariatrics

## 2019-12-08 ENCOUNTER — Other Ambulatory Visit: Payer: Self-pay

## 2019-12-08 ENCOUNTER — Ambulatory Visit (INDEPENDENT_AMBULATORY_CARE_PROVIDER_SITE_OTHER): Admitting: Bariatrics

## 2019-12-08 ENCOUNTER — Encounter (INDEPENDENT_AMBULATORY_CARE_PROVIDER_SITE_OTHER): Payer: Self-pay | Admitting: Bariatrics

## 2019-12-08 VITALS — BP 124/73 | HR 61 | Temp 98.4°F | Ht 66.0 in | Wt 173.0 lb

## 2019-12-08 DIAGNOSIS — Z6830 Body mass index (BMI) 30.0-30.9, adult: Secondary | ICD-10-CM

## 2019-12-08 DIAGNOSIS — E7849 Other hyperlipidemia: Secondary | ICD-10-CM

## 2019-12-08 DIAGNOSIS — E669 Obesity, unspecified: Secondary | ICD-10-CM | POA: Diagnosis not present

## 2019-12-08 DIAGNOSIS — I10 Essential (primary) hypertension: Secondary | ICD-10-CM

## 2019-12-08 NOTE — Progress Notes (Signed)
Chief Complaint:   OBESITY Alicia Knapp is here to discuss her progress with her obesity treatment plan along with follow-up of her obesity related diagnoses. Alicia Knapp is on the Category 1 Plan and states she is following her eating plan approximately 75% of the time. Alicia Knapp states she is doing cardio 60 minutes 3 times per week and strengthening 30 minutes 2 times per week.  Today's visit was #: 62 Starting weight: 212 lbs Starting date: 11/03/2017 Today's weight: 173 lbs Today's date: 12/08/2019 Total lbs lost to date: 39 Total lbs lost since last in-office visit: 3  Interim History: Alicia Knapp is down 3 lbs. She is trying to get adequate water intake. She does report getting adequate protein.  Subjective:   Essential hypertension. Alicia Knapp is taking Norvasc, Coreg, HCTZ, and Cozaar. Blood pressure is 124/73 today.  BP Readings from Last 3 Encounters:  12/08/19 124/73  11/22/19 119/71  11/07/19 136/80   Lab Results  Component Value Date   CREATININE 0.82 08/16/2019   CREATININE 0.90 04/08/2019   CREATININE 0.84 11/09/2018   Other hyperlipidemia. Alicia Knapp is taking Lipitor.   Lab Results  Component Value Date   CHOL 148 04/08/2019   HDL 48 04/08/2019   LDLCALC 87 04/08/2019   LDLDIRECT 140.5 08/12/2013   TRIG 67 04/08/2019   CHOLHDL 3 05/18/2018   Lab Results  Component Value Date   ALT 18 08/16/2019   AST 15 08/16/2019   ALKPHOS 80 08/16/2019   BILITOT 0.4 08/16/2019   The 10-year ASCVD risk score Alicia Bussing DC Jr., et al., 2013) is: 5.4%   Values used to calculate the score:     Age: 64 years     Sex: Female     Is Non-Hispanic African American: Yes     Diabetic: No     Tobacco smoker: No     Systolic Blood Pressure: [id number] mmHg     Is BP treated: Yes     HDL Cholesterol: 48 mg/dL     Total Cholesterol: 148 mg/dL  Assessment/Plan:   Essential hypertension. Alicia Knapp is working on healthy weight loss and exercise to improve blood pressure control. We will watch for signs of  hypotension as she continues her lifestyle modifications. Alicia Knapp will continue her medications as prescribed.  Other hyperlipidemia. Cardiovascular risk and specific lipid/LDL goals reviewed.  We discussed several lifestyle modifications today and Alicia Knapp will continue to work on diet, exercise and weight loss efforts. Orders and follow up as documented in patient record. She will continue Lipitor as prescribed. She will decrease trans and saturated fats, increase MUFA's and PUFA's, and will read labels.  Counseling Intensive lifestyle modifications are the first line treatment for this issue. . Dietary changes: Increase soluble fiber. Decrease simple carbohydrates. . Exercise changes: Moderate to vigorous-intensity aerobic activity 150 minutes per week if tolerated. . Lipid-lowering medications: see documented in medical record.  Class 1 obesity with serious comorbidity and body mass index (BMI) of 30.0 to 30.9 in adult, unspecified obesity type.  Alicia Knapp is currently in the action stage of change. As such, her goal is to continue with weight loss efforts. She has agreed to the Category 1 Plan.   She will work on meal planning, intentional eating, and will increase protein with breakfast.  Exercise goals: Alicia Knapp will continue cardio/strengthening 30 minutes 3 times per week.  Behavioral modification strategies: increasing lean protein intake, decreasing simple carbohydrates, increasing vegetables, increasing water intake, decreasing eating out, no skipping meals, meal planning and cooking strategies, keeping  healthy foods in the home and planning for success.  Alicia Knapp has agreed to follow-up with our clinic in 2 weeks. She was informed of the importance of frequent follow-up visits to maximize her success with intensive lifestyle modifications for her multiple health conditions.   Objective:   Blood pressure 124/73, pulse 61, temperature 98.4 F (36.9 C), height 5\' 6"  (1.676 m), weight 173 lb (78.5 kg),  SpO2 100 %. Body mass index is 27.92 kg/m.  General: Cooperative, alert, well developed, in no acute distress. HEENT: Conjunctivae and lids unremarkable. Cardiovascular: Regular rhythm.  Lungs: Normal work of breathing. Neurologic: No focal deficits.   Lab Results  Component Value Date   CREATININE 0.82 08/16/2019   BUN 20 08/16/2019   NA 141 08/16/2019   K 4.1 08/16/2019   CL 103 08/16/2019   CO2 22 08/16/2019   Lab Results  Component Value Date   ALT 18 08/16/2019   AST 15 08/16/2019   ALKPHOS 80 08/16/2019   BILITOT 0.4 08/16/2019   Lab Results  Component Value Date   HGBA1C 5.1 08/16/2019   HGBA1C 5.4 04/08/2019   HGBA1C 5.5 11/09/2018   HGBA1C 5.7 05/18/2018   HGBA1C 5.6 12/21/2017   Lab Results  Component Value Date   INSULIN 10.4 08/16/2019   INSULIN 20.3 04/08/2019   INSULIN 13.8 11/09/2018   INSULIN 15.4 08/03/2018   Lab Results  Component Value Date   TSH 2.530 04/08/2019   Lab Results  Component Value Date   CHOL 148 04/08/2019   HDL 48 04/08/2019   LDLCALC 87 04/08/2019   LDLDIRECT 140.5 08/12/2013   TRIG 67 04/08/2019   CHOLHDL 3 05/18/2018   Lab Results  Component Value Date   WBC 3.6 (L) 09/15/2017   HGB 12.7 09/15/2017   HCT 39.3 09/15/2017   MCV 88.7 09/15/2017   PLT 349.0 09/15/2017   No results found for: IRON, TIBC, FERRITIN  Attestation Statements:   Reviewed by clinician on day of visit: allergies, medications, problem list, medical history, surgical history, family history, social history, and previous encounter notes.  Time spent on visit including pre-visit chart review and post-visit care was 20 minutes.   Migdalia Dk, am acting as Location manager for [employer], DO   I have reviewed the above documentation for accuracy and completeness, and I agree with the above. Jearld Lesch, DO

## 2019-12-22 ENCOUNTER — Other Ambulatory Visit (INDEPENDENT_AMBULATORY_CARE_PROVIDER_SITE_OTHER): Payer: Self-pay | Admitting: Bariatrics

## 2019-12-22 ENCOUNTER — Ambulatory Visit (INDEPENDENT_AMBULATORY_CARE_PROVIDER_SITE_OTHER): Admitting: Bariatrics

## 2019-12-22 DIAGNOSIS — E8881 Metabolic syndrome: Secondary | ICD-10-CM

## 2019-12-26 ENCOUNTER — Other Ambulatory Visit: Payer: Self-pay

## 2019-12-26 ENCOUNTER — Ambulatory Visit (INDEPENDENT_AMBULATORY_CARE_PROVIDER_SITE_OTHER): Admitting: Bariatrics

## 2019-12-26 ENCOUNTER — Encounter (INDEPENDENT_AMBULATORY_CARE_PROVIDER_SITE_OTHER): Payer: Self-pay | Admitting: Bariatrics

## 2019-12-26 VITALS — BP 150/69 | HR 68 | Temp 98.5°F | Ht 66.0 in | Wt 174.0 lb

## 2019-12-26 DIAGNOSIS — Z6830 Body mass index (BMI) 30.0-30.9, adult: Secondary | ICD-10-CM

## 2019-12-26 DIAGNOSIS — I10 Essential (primary) hypertension: Secondary | ICD-10-CM | POA: Diagnosis not present

## 2019-12-26 DIAGNOSIS — E669 Obesity, unspecified: Secondary | ICD-10-CM

## 2019-12-26 DIAGNOSIS — E559 Vitamin D deficiency, unspecified: Secondary | ICD-10-CM | POA: Diagnosis not present

## 2019-12-27 ENCOUNTER — Encounter (INDEPENDENT_AMBULATORY_CARE_PROVIDER_SITE_OTHER): Payer: Self-pay | Admitting: Bariatrics

## 2019-12-27 NOTE — Progress Notes (Signed)
Chief Complaint:   OBESITY Alicia Knapp is here to discuss her progress with her obesity treatment plan along with follow-up of her obesity related diagnoses. Alicia Knapp is on the Category 1 Plan and states she is following her eating plan approximately 50% of the time. Alicia Knapp states she is doing cardio/strength training 30-60 minutes 6 times per week.  Today's visit was #: 30 Starting weight: 212 lbs Starting date: 08/03/2018 Today's weight: 174 lbs Today's date: 12/26/2019 Total lbs lost to date: 38 Total lbs lost since last in-office visit: 0  Interim History: Alicia Knapp is up 1 lb, but has done well overall. She had a birthday and Valentine's Day.  Subjective:   Vitamin D deficiency. No nausea, vomiting, or muscle weakness. Last Vitamin D level 38.4 on 08/16/2019.   Essential hypertension. Blood pressure is reasonably well controlled.  BP Readings from Last 3 Encounters:  12/26/19 (!) 150/69  12/08/19 124/73  11/22/19 119/71   Lab Results  Component Value Date   CREATININE 0.82 08/16/2019   CREATININE 0.90 04/08/2019   CREATININE 0.84 11/09/2018    Assessment/Plan:   Vitamin D deficiency. Low Vitamin D level contributes to fatigue and are associated with obesity, breast, and colon cancer. She agrees to continue to take Vitamin D and will follow-up for routine testing of Vitamin D, at least 2-3 times per year to avoid over-replacement.  Essential hypertension. Alicia Knapp is working on healthy weight loss and exercise to improve blood pressure control. We will watch for signs of hypotension as she continues her lifestyle modifications. She will continue her medications as directed.  Class 1 obesity with serious comorbidity and body mass index (BMI) of 30.0 to 30.9 in adult, unspecified obesity type - BMI greater than 30 at start of program.    Alicia Knapp is currently in the action stage of change. As such, her goal is to continue with weight loss efforts. She has agreed to the Category 1 Plan.     She will work on meal planning, intentional eating, increasing her water intake, and decreasing carbohydrates.  Exercise goals: Alicia Knapp is doing well with her exercises and will continue.   Behavioral modification strategies: increasing lean protein intake, decreasing simple carbohydrates, increasing vegetables, increasing water intake, decreasing eating out, no skipping meals, meal planning and cooking strategies, keeping healthy foods in the home and planning for success.  Alicia Knapp has agreed to follow-up with our clinic in 2 weeks. She was informed of the importance of frequent follow-up visits to maximize her success with intensive lifestyle modifications for her multiple health conditions.   Objective:   Blood pressure (!) 150/69, pulse 68, temperature 98.5 F (36.9 C), temperature source Oral, height 5\' 6"  (1.676 m), weight 174 lb (78.9 kg), SpO2 99 %. Body mass index is 28.08 kg/m.  General: Cooperative, alert, well developed, in no acute distress. HEENT: Conjunctivae and lids unremarkable. Cardiovascular: Regular rhythm.  Lungs: Normal work of breathing. Neurologic: No focal deficits.   Lab Results  Component Value Date   CREATININE 0.82 08/16/2019   BUN 20 08/16/2019   NA 141 08/16/2019   K 4.1 08/16/2019   CL 103 08/16/2019   CO2 22 08/16/2019   Lab Results  Component Value Date   ALT 18 08/16/2019   AST 15 08/16/2019   ALKPHOS 80 08/16/2019   BILITOT 0.4 08/16/2019   Lab Results  Component Value Date   HGBA1C 5.1 08/16/2019   HGBA1C 5.4 04/08/2019   HGBA1C 5.5 11/09/2018   HGBA1C 5.7 05/18/2018  HGBA1C 5.6 12/21/2017   Lab Results  Component Value Date   INSULIN 10.4 08/16/2019   INSULIN 20.3 04/08/2019   INSULIN 13.8 11/09/2018   INSULIN 15.4 08/03/2018   Lab Results  Component Value Date   TSH 2.530 04/08/2019   Lab Results  Component Value Date   CHOL 148 04/08/2019   HDL 48 04/08/2019   LDLCALC 87 04/08/2019   LDLDIRECT 140.5 08/12/2013    TRIG 67 04/08/2019   CHOLHDL 3 05/18/2018   Lab Results  Component Value Date   WBC 3.6 (L) 09/15/2017   HGB 12.7 09/15/2017   HCT 39.3 09/15/2017   MCV 88.7 09/15/2017   PLT 349.0 09/15/2017   No results found for: IRON, TIBC, FERRITIN  Attestation Statements:   Reviewed by clinician on day of visit: allergies, medications, problem list, medical history, surgical history, family history, social history, and previous encounter notes.  Time spent on visit including pre-visit chart review and post-visit care was 20 minutes.   Migdalia Dk, am acting as Location manager for [employer], DO   I have reviewed the above documentation for accuracy and completeness, and I agree with the above. Jearld Lesch, DO

## 2020-01-17 ENCOUNTER — Other Ambulatory Visit: Payer: Self-pay | Admitting: Family Medicine

## 2020-01-17 ENCOUNTER — Ambulatory Visit (INDEPENDENT_AMBULATORY_CARE_PROVIDER_SITE_OTHER): Admitting: Bariatrics

## 2020-01-17 ENCOUNTER — Other Ambulatory Visit: Payer: Self-pay

## 2020-01-17 ENCOUNTER — Encounter (INDEPENDENT_AMBULATORY_CARE_PROVIDER_SITE_OTHER): Payer: Self-pay | Admitting: Bariatrics

## 2020-01-17 VITALS — BP 143/92 | HR 62 | Temp 98.7°F | Ht 66.0 in | Wt 177.0 lb

## 2020-01-17 DIAGNOSIS — E88819 Insulin resistance, unspecified: Secondary | ICD-10-CM

## 2020-01-17 DIAGNOSIS — E8881 Metabolic syndrome: Secondary | ICD-10-CM

## 2020-01-17 DIAGNOSIS — Z6830 Body mass index (BMI) 30.0-30.9, adult: Secondary | ICD-10-CM

## 2020-01-17 DIAGNOSIS — E559 Vitamin D deficiency, unspecified: Secondary | ICD-10-CM

## 2020-01-17 DIAGNOSIS — I10 Essential (primary) hypertension: Secondary | ICD-10-CM

## 2020-01-17 DIAGNOSIS — E66811 Obesity, class 1: Secondary | ICD-10-CM

## 2020-01-17 DIAGNOSIS — E669 Obesity, unspecified: Secondary | ICD-10-CM

## 2020-01-17 NOTE — Progress Notes (Signed)
Chief Complaint:   OBESITY Alicia Knapp is here to discuss her progress with her obesity treatment plan along with follow-up of her obesity related diagnoses. Alicia Knapp is on the Category 1 Plan and states she is following her eating plan approximately 70% of the time. Alicia Knapp states she is doing strengthening 30 minutes 2 times per week and doing cardio 60 minutes 3 times per week.  Today's visit was #: 22 Starting weight: 212 lbs Starting date: 08/03/2018 Today's weight: 177 lbs Today's date: 01/17/2020 Total lbs lost to date: 35 Total lbs lost since last in-office visit: 0  Interim History: Alicia Knapp is up 3 lbs from her last visit. She is doing well with the plan as far as eating what is on the plan, but is eating additional food.  Subjective:   Insulin resistance (mild). Addisyn has a diagnosis of insulin resistance based on her elevated fasting insulin level >5. She continues to work on diet and exercise to decrease her risk of diabetes. She is taking metformin.  Lab Results  Component Value Date   INSULIN 10.4 08/16/2019   INSULIN 20.3 04/08/2019   INSULIN 13.8 11/09/2018   INSULIN 15.4 08/03/2018   Lab Results  Component Value Date   HGBA1C 5.1 08/16/2019   Vitamin D deficiency. Last Vitamin D 38.4 on 08/16/2019.  Assessment/Plan:   Insulin resistance (mild). Alicia Knapp will continue to work on weight loss, exercise, and decreasing simple carbohydrates to help decrease the risk of diabetes. Alicia Knapp agreed to follow-up with [name] as directed to closely monitor her progress. She will continue metformin as directed.  Vitamin D deficiency. Low Vitamin D level contributes to fatigue and are associated with obesity, breast, and colon cancer. She agrees to continue to take Vitamin D and will follow-up for routine testing of Vitamin D, at least 2-3 times per year to avoid over-replacement.  Class 1 obesity with serious comorbidity and body mass index (BMI) of 30.0 to 30.9 in adult, unspecified  obesity type - BMI greater than 30 at start of program.   Alicia Knapp is currently in the action stage of change. As such, her goal is to continue with weight loss efforts. She has agreed to following a lower carbohydrate, vegetable and lean protein rich diet plan.   She will work on meal planning and increasing her water intake.  Exercise goals: All adults should avoid inactivity. Some physical activity is better than none, and adults who participate in any amount of physical activity gain some health benefits.  Behavioral modification strategies: increasing lean protein intake, decreasing simple carbohydrates, increasing vegetables, increasing water intake, no skipping meals, keeping healthy foods in the home, ways to avoid night time snacking, better snacking choices, emotional eating strategies and planning for success.  Alicia Knapp has agreed to follow-up with our clinic in 2 weeks. She was informed of the importance of frequent follow-up visits to maximize her success with intensive lifestyle modifications for her multiple health conditions.   Objective:   Blood pressure (!) 143/92, pulse 62, temperature 98.7 F (37.1 C), height 5\' 6"  (1.676 m), weight 177 lb (80.3 kg), SpO2 100 %. Body mass index is 28.57 kg/m.  General: Cooperative, alert, well developed, in no acute distress. HEENT: Conjunctivae and lids unremarkable. Cardiovascular: Regular rhythm.  Lungs: Normal work of breathing. Neurologic: No focal deficits.   Lab Results  Component Value Date   CREATININE 0.82 08/16/2019   BUN 20 08/16/2019   NA 141 08/16/2019   K 4.1 08/16/2019   CL  103 08/16/2019   CO2 22 08/16/2019   Lab Results  Component Value Date   ALT 18 08/16/2019   AST 15 08/16/2019   ALKPHOS 80 08/16/2019   BILITOT 0.4 08/16/2019   Lab Results  Component Value Date   HGBA1C 5.1 08/16/2019   HGBA1C 5.4 04/08/2019   HGBA1C 5.5 11/09/2018   HGBA1C 5.7 05/18/2018   HGBA1C 5.6 12/21/2017   Lab Results    Component Value Date   INSULIN 10.4 08/16/2019   INSULIN 20.3 04/08/2019   INSULIN 13.8 11/09/2018   INSULIN 15.4 08/03/2018   Lab Results  Component Value Date   TSH 2.530 04/08/2019   Lab Results  Component Value Date   CHOL 148 04/08/2019   HDL 48 04/08/2019   LDLCALC 87 04/08/2019   LDLDIRECT 140.5 08/12/2013   TRIG 67 04/08/2019   CHOLHDL 3 05/18/2018   Lab Results  Component Value Date   WBC 3.6 (L) 09/15/2017   HGB 12.7 09/15/2017   HCT 39.3 09/15/2017   MCV 88.7 09/15/2017   PLT 349.0 09/15/2017   No results found for: IRON, TIBC, FERRITIN  Attestation Statements:   Reviewed by clinician on day of visit: allergies, medications, problem list, medical history, surgical history, family history, social history, and previous encounter notes.  Time spent on visit including pre-visit chart review and post-visit charting and care was 20 minutes.   Migdalia Dk, am acting as Location manager for [employer], DO   I have reviewed the above documentation for accuracy and completeness, and I agree with the above. Jearld Lesch, DO

## 2020-01-31 ENCOUNTER — Other Ambulatory Visit: Payer: Self-pay

## 2020-01-31 ENCOUNTER — Encounter (INDEPENDENT_AMBULATORY_CARE_PROVIDER_SITE_OTHER): Payer: Self-pay | Admitting: Bariatrics

## 2020-01-31 ENCOUNTER — Ambulatory Visit (INDEPENDENT_AMBULATORY_CARE_PROVIDER_SITE_OTHER): Admitting: Bariatrics

## 2020-01-31 VITALS — BP 122/73 | HR 67 | Temp 98.0°F | Ht 66.0 in | Wt 172.0 lb

## 2020-01-31 DIAGNOSIS — E669 Obesity, unspecified: Secondary | ICD-10-CM

## 2020-01-31 DIAGNOSIS — Z9189 Other specified personal risk factors, not elsewhere classified: Secondary | ICD-10-CM | POA: Diagnosis not present

## 2020-01-31 DIAGNOSIS — I10 Essential (primary) hypertension: Secondary | ICD-10-CM

## 2020-01-31 DIAGNOSIS — E8881 Metabolic syndrome: Secondary | ICD-10-CM | POA: Diagnosis not present

## 2020-01-31 DIAGNOSIS — Z6830 Body mass index (BMI) 30.0-30.9, adult: Secondary | ICD-10-CM

## 2020-01-31 MED ORDER — METFORMIN HCL 500 MG PO TABS: 500.0000 mg | ORAL_TABLET | Freq: Two times a day (BID) | ORAL | 0 refills | Status: DC

## 2020-01-31 NOTE — Progress Notes (Signed)
Chief Complaint:   OBESITY Alicia Knapp is here to discuss her progress with her obesity treatment plan along with follow-up of her obesity related diagnoses. Alicia Knapp is following a lower carbohydrate, vegetable and lean protein rich diet plan and the Category 1 plan and states she is following her eating plan approximately 90% of the time. Alicia Knapp states she is doing strengthening 30 minutes 2 times per week and cardio 60 minutes 3 times per week.  Today's visit was #: 42 Starting weight: 212 lbs Starting date: 08/03/2018 Today's weight: 172 lbs Today's date: 01/31/2020 Total lbs lost to date: 40 Total lbs lost since last in-office visit: 5  Interim History: Alicia Knapp is down an additional 5 lbs and doing exceptionally well overall. She is drinking broth (low sodium) between meals.  Subjective:   Insulin resistance (mild). Alicia Knapp has a diagnosis of insulin resistance based on her elevated fasting insulin level >5. She continues to work on diet and exercise to decrease her risk of diabetes. No polyphagia.  Lab Results  Component Value Date   INSULIN 10.4 08/16/2019   INSULIN 20.3 04/08/2019   INSULIN 13.8 11/09/2018   INSULIN 15.4 08/03/2018   Lab Results  Component Value Date   HGBA1C 5.1 08/16/2019   Essential hypertension. Blood pressure is controlled.  BP Readings from Last 3 Encounters:  01/31/20 122/73  01/17/20 (!) 143/92  12/26/19 (!) 150/69   Lab Results  Component Value Date   CREATININE 0.82 08/16/2019   CREATININE 0.90 04/08/2019   CREATININE 0.84 11/09/2018   At risk for hypoglycemia. Alicia Knapp is at increased risk for hypoglycemia due to insulin resistance and weight.  Assessment/Plan:   Insulin resistance (mild). Alicia Knapp will continue to work on weight loss, exercise, and decreasing simple carbohydrates to help decrease the risk of diabetes. Alicia Knapp agreed to follow-up with [name] as directed to closely monitor her progress. Alicia Knapp was given a prescription for metFORMIN  (GLUCOPHAGE) 500 MG tablet 1 PO BID with meal #180 with 0 refills.  Essential hypertension. Alicia Knapp is working on healthy weight loss and exercise to improve blood pressure control. We will watch for signs of hypotension as she continues her lifestyle modifications. She will  Continue her medications as directed and will minimize her salt intake.  At risk for hypoglycemia. Alicia Knapp was given approximately 15 minutes of counseling today regarding prevention of hypoglycemia. She was advised of symptoms of hypoglycemia. Alicia Knapp was instructed to avoid skipping meals, eat regular protein rich meals and schedule low calorie snacks as needed.   Repetitive spaced learning was employed today to elicit superior memory formation and behavioral change.  Class 1 obesity with serious comorbidity and body mass index (BMI) of 30.0 to 30.9 in adult, unspecified obesity type - Starting BMI greater then 30.  Alicia Knapp is currently in the action stage of change. As such, her goal is to continue with weight loss efforts. She has agreed to the Category 1 Plan with low carb focus.   She will work on meal planning, keeping carbohydrates low, and staying adherent to the plan. Handout was given on Eating Out.  Exercise goals: All adults should avoid inactivity. Some physical activity is better than none, and adults who participate in any amount of physical activity gain some health benefits.  Behavioral modification strategies: increasing lean protein intake, decreasing simple carbohydrates, increasing vegetables, increasing water intake, decreasing eating out, no skipping meals, meal planning and cooking strategies, keeping healthy foods in the home and planning for success.  Alicia Knapp has  agreed to follow-up with our clinic in 2 weeks. She was informed of the importance of frequent follow-up visits to maximize her success with intensive lifestyle modifications for her multiple health conditions.   Objective:   Blood pressure 122/73,  pulse 67, temperature 98 F (36.7 C), height 5\' 6"  (1.676 m), weight 172 lb (78 kg), SpO2 100 %. Body mass index is 27.76 kg/m.  General: Cooperative, alert, well developed, in no acute distress. HEENT: Conjunctivae and lids unremarkable. Cardiovascular: Regular rhythm.  Lungs: Normal work of breathing. Neurologic: No focal deficits.   Lab Results  Component Value Date   CREATININE 0.82 08/16/2019   BUN 20 08/16/2019   NA 141 08/16/2019   K 4.1 08/16/2019   CL 103 08/16/2019   CO2 22 08/16/2019   Lab Results  Component Value Date   ALT 18 08/16/2019   AST 15 08/16/2019   ALKPHOS 80 08/16/2019   BILITOT 0.4 08/16/2019   Lab Results  Component Value Date   HGBA1C 5.1 08/16/2019   HGBA1C 5.4 04/08/2019   HGBA1C 5.5 11/09/2018   HGBA1C 5.7 05/18/2018   HGBA1C 5.6 12/21/2017   Lab Results  Component Value Date   INSULIN 10.4 08/16/2019   INSULIN 20.3 04/08/2019   INSULIN 13.8 11/09/2018   INSULIN 15.4 08/03/2018   Lab Results  Component Value Date   TSH 2.530 04/08/2019   Lab Results  Component Value Date   CHOL 148 04/08/2019   HDL 48 04/08/2019   LDLCALC 87 04/08/2019   LDLDIRECT 140.5 08/12/2013   TRIG 67 04/08/2019   CHOLHDL 3 05/18/2018   Lab Results  Component Value Date   WBC 3.6 (L) 09/15/2017   HGB 12.7 09/15/2017   HCT 39.3 09/15/2017   MCV 88.7 09/15/2017   PLT 349.0 09/15/2017   No results found for: IRON, TIBC, FERRITIN  Attestation Statements:   Reviewed by clinician on day of visit: allergies, medications, problem list, medical history, surgical history, family history, social history, and previous encounter notes.  Migdalia Dk, am acting as Location manager for [employer], DO   I have reviewed the above documentation for accuracy and completeness, and I agree with the above. Jearld Lesch, DO

## 2020-02-14 ENCOUNTER — Other Ambulatory Visit: Payer: Self-pay

## 2020-02-14 ENCOUNTER — Ambulatory Visit (INDEPENDENT_AMBULATORY_CARE_PROVIDER_SITE_OTHER): Admitting: Bariatrics

## 2020-02-14 ENCOUNTER — Encounter (INDEPENDENT_AMBULATORY_CARE_PROVIDER_SITE_OTHER): Payer: Self-pay | Admitting: Bariatrics

## 2020-02-14 VITALS — BP 118/70 | HR 63 | Temp 98.5°F | Ht 66.0 in | Wt 173.0 lb

## 2020-02-14 DIAGNOSIS — Z6830 Body mass index (BMI) 30.0-30.9, adult: Secondary | ICD-10-CM

## 2020-02-14 DIAGNOSIS — E559 Vitamin D deficiency, unspecified: Secondary | ICD-10-CM

## 2020-02-14 DIAGNOSIS — E669 Obesity, unspecified: Secondary | ICD-10-CM

## 2020-02-14 DIAGNOSIS — I10 Essential (primary) hypertension: Secondary | ICD-10-CM | POA: Diagnosis not present

## 2020-02-14 NOTE — Progress Notes (Signed)
Chief Complaint:   OBESITY Alicia Knapp is here to discuss her progress with her obesity treatment plan along with follow-up of her obesity related diagnoses. Alicia Knapp is on the Category 1 Plan and states she is following her eating plan approximately 80% of the time. Alicia Knapp states she is strengthening 30 minutes 2 times per week and cardio 60 minutes 3 times per week.  Today's visit was #: 64 Starting weight: 212 lbs Starting date: 08/03/2018 Today's weight: 173 lbs Today's date: 02/14/2020 Total lbs lost to date: 39 Total lbs lost since last in-office visit: 0  Interim History: Alicia Knapp is up 1 lb, but it is water per the bioimpedance scale.  Subjective:   Vitamin D deficiency. No nausea, vomiting, or muscle weakness. Last Vitamin D 38.4 on 08/16/2019.  Essential hypertension. Blood pressure is well controlled. Alicia Knapp is taking Norvasc, Coreg, HCTZ, and Cozaar.  BP Readings from Last 3 Encounters:  02/14/20 118/70  01/31/20 122/73  01/17/20 (!) 143/92   Lab Results  Component Value Date   CREATININE 0.82 08/16/2019   CREATININE 0.90 04/08/2019   CREATININE 0.84 11/09/2018    Assessment/Plan:   Vitamin D deficiency. Low Vitamin D level contributes to fatigue and are associated with obesity, breast, and colon cancer. She agrees to continue to take Vitamin D and will follow-up for routine testing of Vitamin D, at least 2-3 times per year to avoid over-replacement.  Essential hypertension. Alicia Knapp is working on healthy weight loss and exercise to improve blood pressure control. We will watch for signs of hypotension as she continues her lifestyle modifications. She will continue her medications as directed.  Class 1 obesity with serious comorbidity and body mass index (BMI) of 30.0 to 30.9 in adult, unspecified obesity type - BMI greater than 30 at start of program.  Alicia Knapp is currently in the action stage of change. As such, her goal is to continue with weight loss efforts. She has agreed  to the Category 1 Plan with low carb focus.   She will work on meal planning, mindful eating, and increasing her water intake.  Exercise goals: Alicia Knapp will continue her current exercise regimen with strengthening and cardio.  Behavioral modification strategies: increasing lean protein intake, decreasing simple carbohydrates, increasing vegetables, increasing water intake, decreasing eating out, no skipping meals, meal planning and cooking strategies, keeping healthy foods in the home and planning for success.  Alicia Knapp has agreed to follow-up with our clinic in 2 weeks. She was informed of the importance of frequent follow-up visits to maximize her success with intensive lifestyle modifications for her multiple health conditions.   Objective:   Blood pressure 118/70, pulse 63, temperature 98.5 F (36.9 C), height 5\' 6"  (1.676 m), weight 173 lb (78.5 kg), SpO2 100 %. Body mass index is 27.92 kg/m.  General: Cooperative, alert, well developed, in no acute distress. HEENT: Conjunctivae and lids unremarkable. Cardiovascular: Regular rhythm.  Lungs: Normal work of breathing. Neurologic: No focal deficits.   Lab Results  Component Value Date   CREATININE 0.82 08/16/2019   BUN 20 08/16/2019   NA 141 08/16/2019   K 4.1 08/16/2019   CL 103 08/16/2019   CO2 22 08/16/2019   Lab Results  Component Value Date   ALT 18 08/16/2019   AST 15 08/16/2019   ALKPHOS 80 08/16/2019   BILITOT 0.4 08/16/2019   Lab Results  Component Value Date   HGBA1C 5.1 08/16/2019   HGBA1C 5.4 04/08/2019   HGBA1C 5.5 11/09/2018   HGBA1C  5.7 05/18/2018   HGBA1C 5.6 12/21/2017   Lab Results  Component Value Date   INSULIN 10.4 08/16/2019   INSULIN 20.3 04/08/2019   INSULIN 13.8 11/09/2018   INSULIN 15.4 08/03/2018   Lab Results  Component Value Date   TSH 2.530 04/08/2019   Lab Results  Component Value Date   CHOL 148 04/08/2019   HDL 48 04/08/2019   LDLCALC 87 04/08/2019   LDLDIRECT 140.5 08/12/2013    TRIG 67 04/08/2019   CHOLHDL 3 05/18/2018   Lab Results  Component Value Date   WBC 3.6 (L) 09/15/2017   HGB 12.7 09/15/2017   HCT 39.3 09/15/2017   MCV 88.7 09/15/2017   PLT 349.0 09/15/2017   No results found for: IRON, TIBC, FERRITIN  Attestation Statements:   Reviewed by clinician on day of visit: allergies, medications, problem list, medical history, surgical history, family history, social history, and previous encounter notes.  Time spent on visit including pre-visit chart review and post-visit charting and care was 20 minutes.   Migdalia Dk, am acting as Location manager for [employer], DO   I have reviewed the above documentation for accuracy and completeness, and I agree with the above. Jearld Lesch, DO

## 2020-02-28 ENCOUNTER — Other Ambulatory Visit: Payer: Self-pay

## 2020-02-28 ENCOUNTER — Ambulatory Visit (INDEPENDENT_AMBULATORY_CARE_PROVIDER_SITE_OTHER): Admitting: Bariatrics

## 2020-02-28 ENCOUNTER — Encounter (INDEPENDENT_AMBULATORY_CARE_PROVIDER_SITE_OTHER): Payer: Self-pay | Admitting: Bariatrics

## 2020-02-28 VITALS — BP 138/74 | HR 63 | Temp 98.4°F | Ht 66.0 in | Wt 175.0 lb

## 2020-02-28 DIAGNOSIS — E669 Obesity, unspecified: Secondary | ICD-10-CM

## 2020-02-28 DIAGNOSIS — Z6830 Body mass index (BMI) 30.0-30.9, adult: Secondary | ICD-10-CM

## 2020-02-28 DIAGNOSIS — Z9189 Other specified personal risk factors, not elsewhere classified: Secondary | ICD-10-CM | POA: Diagnosis not present

## 2020-02-28 DIAGNOSIS — I10 Essential (primary) hypertension: Secondary | ICD-10-CM

## 2020-02-28 DIAGNOSIS — E559 Vitamin D deficiency, unspecified: Secondary | ICD-10-CM | POA: Diagnosis not present

## 2020-02-28 MED ORDER — VITAMIN D (ERGOCALCIFEROL) 1.25 MG (50000 UNIT) PO CAPS: ORAL_CAPSULE | ORAL | 0 refills | Status: DC

## 2020-02-28 NOTE — Progress Notes (Signed)
Chief Complaint:   OBESITY Alicia Knapp is here to discuss her progress with her obesity treatment plan along with follow-up of her obesity related diagnoses. [name] is on the Category 1 Plan with a carb focus and states she is following her eating plan approximately 70% of the time. [name] states she is working with weights 30 minutes 2 times per week and doing cardio 60 minutes 3 times per week.  Today's visit was #: 22 Starting weight: 212 lbs Starting date: 08/03/2018 Today's weight: 175 lbs Today's date: 02/28/2020 Total lbs lost to date: 37 Total lbs lost since last in-office visit: 0  Interim History: [name] is up 2 lbs, but doing well overall.  Subjective:   Vitamin D deficiency. No nausea, vomiting, or muscle weakness. Last Vitamin D 38.4 on 08/16/2019.  Essential hypertension. Blood pressure is controlled.  BP Readings from Last 3 Encounters:  02/28/20 138/74  02/14/20 118/70  01/31/20 122/73   Lab Results  Component Value Date   CREATININE 0.82 08/16/2019   CREATININE 0.90 04/08/2019   CREATININE 0.84 11/09/2018   At risk for osteoporosis. [name] is at higher risk of osteopenia and osteoporosis due to Vitamin D deficiency.    Assessment/Plan:   Vitamin D deficiency. Low Vitamin D level contributes to fatigue and are associated with obesity, breast, and colon cancer. She was given a prescription for Vitamin D, Ergocalciferol, (DRISDOL) 1.25 MG (50000 UNIT) CAPS capsule every week #4 with 0 refills and will follow-up for routine testing of Vitamin D, at least 2-3 times per year to avoid over-replacement.    Essential hypertension. Sherby is working on healthy weight loss and exercise to improve blood pressure control. We will watch for signs of hypotension as she continues her lifestyle modifications. She will continue her medication as directed.  At risk for osteoporosis. [name] was given approximately 15 minutes of osteoporosis prevention counseling today. [name] is at risk  for osteopenia and osteoporosis due to her Vitamin D deficiency. She was encouraged to take her Vitamin D and follow her higher calcium diet and increase strengthening exercise to help strengthen her bones and decrease her risk of osteopenia and osteoporosis.  Repetitive spaced learning was employed today to elicit superior memory formation and behavioral change.  Class 1 obesity with serious comorbidity and body mass index (BMI) of 30.0 to 30.9 in adult, unspecified obesity type - BMI Greater than 30 at start of program.   [name] is currently in the action stage of change. As such, her goal is to continue with weight loss efforts. She has agreed to the Category 1 Plan with a focus on decreasing carbs.   She will work on meal planning and mindful eating.  Exercise goals: All adults should avoid inactivity. Some physical activity is better than none, and adults who participate in any amount of physical activity gain some health benefits.  Behavioral modification strategies: increasing lean protein intake, decreasing simple carbohydrates, increasing vegetables, increasing water intake, decreasing eating out, no skipping meals, meal planning and cooking strategies, keeping healthy foods in the home and planning for success.  [name] has agreed to follow-up with our clinic in 4 weeks. She was informed of the importance of frequent follow-up visits to maximize her success with intensive lifestyle modifications for her multiple health conditions.   Objective:   Blood pressure 138/74, pulse 63, temperature 98.4 F (36.9 C), height 5\' 6"  (1.676 m), weight 175 lb (79.4 kg), SpO2 99 %. Body mass index is 28.25 kg/m.  General: Cooperative, alert, well developed, in no acute distress. HEENT: Conjunctivae and lids unremarkable. Cardiovascular: Regular rhythm.  Lungs: Normal work of breathing. Neurologic: No focal deficits.   Lab Results  Component Value Date   CREATININE 0.82 08/16/2019   BUN 20  08/16/2019   NA 141 08/16/2019   K 4.1 08/16/2019   CL 103 08/16/2019   CO2 22 08/16/2019   Lab Results  Component Value Date   ALT 18 08/16/2019   AST 15 08/16/2019   ALKPHOS 80 08/16/2019   BILITOT 0.4 08/16/2019   Lab Results  Component Value Date   HGBA1C 5.1 08/16/2019   HGBA1C 5.4 04/08/2019   HGBA1C 5.5 11/09/2018   HGBA1C 5.7 05/18/2018   HGBA1C 5.6 12/21/2017   Lab Results  Component Value Date   INSULIN 10.4 08/16/2019   INSULIN 20.3 04/08/2019   INSULIN 13.8 11/09/2018   INSULIN 15.4 08/03/2018   Lab Results  Component Value Date   TSH 2.530 04/08/2019   Lab Results  Component Value Date   CHOL 148 04/08/2019   HDL 48 04/08/2019   LDLCALC 87 04/08/2019   LDLDIRECT 140.5 08/12/2013   TRIG 67 04/08/2019   CHOLHDL 3 05/18/2018   Lab Results  Component Value Date   WBC 3.6 (L) 09/15/2017   HGB 12.7 09/15/2017   HCT 39.3 09/15/2017   MCV 88.7 09/15/2017   PLT 349.0 09/15/2017   No results found for: IRON, TIBC, FERRITIN  Attestation Statements:   Reviewed by clinician on day of visit: allergies, medications, problem list, medical history, surgical history, family history, social history, and previous encounter notes.  Migdalia Dk, am acting as Location manager for [employer], DO   I have reviewed the above documentation for accuracy and completeness, and I agree with the above. Jearld Lesch, DO

## 2020-03-01 ENCOUNTER — Encounter (INDEPENDENT_AMBULATORY_CARE_PROVIDER_SITE_OTHER): Payer: Self-pay | Admitting: Bariatrics

## 2020-03-05 ENCOUNTER — Ambulatory Visit: Admitting: Family Medicine

## 2020-03-23 ENCOUNTER — Encounter: Payer: Self-pay | Admitting: Family Medicine

## 2020-03-23 ENCOUNTER — Other Ambulatory Visit: Payer: Self-pay

## 2020-03-23 ENCOUNTER — Ambulatory Visit (INDEPENDENT_AMBULATORY_CARE_PROVIDER_SITE_OTHER): Admitting: Family Medicine

## 2020-03-23 VITALS — BP 120/70 | HR 57 | Temp 97.4°F | Resp 18 | Ht 66.0 in | Wt 178.0 lb

## 2020-03-23 DIAGNOSIS — E785 Hyperlipidemia, unspecified: Secondary | ICD-10-CM | POA: Diagnosis not present

## 2020-03-23 DIAGNOSIS — I10 Essential (primary) hypertension: Secondary | ICD-10-CM | POA: Diagnosis not present

## 2020-03-23 LAB — COMPREHENSIVE METABOLIC PANEL
ALT: 17 U/L (ref 0–35)
AST: 16 U/L (ref 0–37)
Albumin: 4.5 g/dL (ref 3.5–5.2)
Alkaline Phosphatase: 61 U/L (ref 39–117)
BUN: 17 mg/dL (ref 6–23)
CO2: 30 mEq/L (ref 19–32)
Calcium: 9.9 mg/dL (ref 8.4–10.5)
Chloride: 100 mEq/L (ref 96–112)
Creatinine, Ser: 0.8 mg/dL (ref 0.40–1.20)
GFR: 87.87 mL/min (ref >60.00)
Glucose, Bld: 89 mg/dL (ref 70–99)
Potassium: 4.1 mEq/L (ref 3.5–5.1)
Sodium: 134 mEq/L — ABNORMAL LOW (ref 135–145)
Total Bilirubin: 0.6 mg/dL (ref 0.2–1.2)
Total Protein: 7.4 g/dL (ref 6.0–8.3)

## 2020-03-23 LAB — LIPID PANEL
Cholesterol: 168 mg/dL (ref 0–200)
HDL: 43.8 mg/dL (ref >39.00)
LDL Cholesterol: 114 mg/dL — ABNORMAL HIGH (ref 0–99)
NonHDL: 124.11
Total CHOL/HDL Ratio: 4
Triglycerides: 51 mg/dL (ref 0.0–149.0)
VLDL: 10.2 mg/dL (ref 0.0–40.0)

## 2020-03-23 LAB — VITAMIN D 25 HYDROXY (VIT D DEFICIENCY, FRACTURES): VITD: 66.09 ng/mL (ref 30.00–100.00)

## 2020-03-23 MED ORDER — CARVEDILOL 6.25 MG PO TABS: 6.2500 mg | ORAL_TABLET | Freq: Two times a day (BID) | ORAL | 1 refills | Status: DC

## 2020-03-23 NOTE — Patient Instructions (Signed)

## 2020-03-23 NOTE — Assessment & Plan Note (Signed)
Encouraged heart healthy diet, increase exercise, avoid trans fats, consider a krill oil cap daily 

## 2020-03-23 NOTE — Progress Notes (Signed)
Patient ID: Alicia Knapp, female    DOB: [birth year]  Age: 64 y.o. MRN: [id number]    Subjective:  Subjective  HPI Alicia Knapp presents for htn and cholesterol   No complaints.    Pt is doing well with healthy weight and wellness   She is at goal and is on maintenance now    Review of Systems  Constitutional: Negative for appetite change, diaphoresis, fatigue and unexpected weight change.  Eyes: Negative for pain, redness and visual disturbance.  Respiratory: Negative for cough, chest tightness, shortness of breath and wheezing.   Cardiovascular: Negative for chest pain, palpitations and leg swelling.  Endocrine: Negative for cold intolerance, heat intolerance, polydipsia, polyphagia and polyuria.  Genitourinary: Negative for difficulty urinating, dysuria and frequency.  Neurological: Negative for dizziness, light-headedness, numbness and headaches.    History Past Medical History:  Diagnosis Date  . Anxiety   . B12 deficiency   . Constipation   . Heart murmur   . Hyperlipidemia   . Hypertension   . Joint pain   . Postoperative nausea   . Prediabetes   . Swelling    lower ext  . Vitamin D deficiency     She has a past surgical history that includes Abdominal hysterectomy; Appendectomy; Colonoscopy (05/22/2015); and Polypectomy.   Her family history includes Arthritis in her maternal grandmother; Cancer in her father and mother; Colon cancer (age of onset: 49) in her brother; Diabetes in her mother; Hyperlipidemia in her mother; Hypertension in her brother, mother, and sister; Kidney disease in her mother; Obesity in her mother; Prostate cancer in her father; Sleep apnea in her mother; Stroke in her brother; Thyroid disease in her mother.She reports that she quit smoking about 5 years ago. Her smoking use included cigarettes. She has never used smokeless tobacco. She reports that she does not drink alcohol or use drugs.  Current Outpatient Medications on File Prior to Visit    Medication Sig Dispense Refill  . amLODipine (NORVASC) 10 MG tablet Take 1 tablet (10 mg total) by mouth daily. 90 tablet 3  . ARIPiprazole (ABILIFY) 2 MG tablet Take 2 mg by mouth daily.     . ARIPiprazole (ABILIFY) 5 MG tablet Take 1 tablet by mouth daily.  0  . atorvastatin (LIPITOR) 10 MG tablet TAKE 1 TABLET DAILY 90 tablet 3  . fluticasone (FLONASE) 50 MCG/ACT nasal spray USE 2 SPRAYS IN EACH NOSTRIL DAILY 48 g 3  . hydrochlorothiazide (HYDRODIURIL) 25 MG tablet TAKE 1 TABLET DAILY 90 tablet 3  . levocetirizine (XYZAL) 5 MG tablet TAKE 1 TABLET EVERY EVENING 90 tablet 3  . losartan (COZAAR) 50 MG tablet TAKE 1 TABLET DAILY 90 tablet 3  . metFORMIN (GLUCOPHAGE) 500 MG tablet Take 1 tablet (500 mg total) by mouth 2 (two) times daily with a meal. 180 tablet 0  . Vitamin D, Ergocalciferol, (DRISDOL) 1.25 MG (50000 UNIT) CAPS capsule TAKE 1 CAPSULE EVERY 7 DAYS 4 capsule 0   No current facility-administered medications on file prior to visit.     Objective:  Objective  Physical Exam Vitals and nursing note reviewed.  Constitutional:      Appearance: She is well-developed.  HENT:     Head: Normocephalic and atraumatic.  Eyes:     Conjunctiva/sclera: Conjunctivae normal.  Neck:     Thyroid: No thyromegaly.     Vascular: No carotid bruit or JVD.  Cardiovascular:     Rate and Rhythm: Normal rate and regular rhythm.  Heart sounds: Normal heart sounds. No murmur.  Pulmonary:     Effort: Pulmonary effort is normal. No respiratory distress.     Breath sounds: Normal breath sounds. No wheezing or rales.  Chest:     Chest wall: No tenderness.  Musculoskeletal:     Cervical back: Normal range of motion and neck supple.  Neurological:     Mental Status: She is alert and oriented to person, place, and time.    BP 120/70 (BP Location: Left Arm, Patient Position: Sitting, Cuff Size: Normal)   Pulse (!) 57   Temp (!) 97.4 F (36.3 C) (Temporal)   Resp 18   Ht 5\' 6"  (1.676 m)    Wt 178 lb (80.7 kg)   SpO2 98%   BMI 28.73 kg/m  Wt Readings from Last 3 Encounters:  03/23/20 178 lb (80.7 kg)  02/28/20 175 lb (79.4 kg)  02/14/20 173 lb (78.5 kg)     Lab Results  Component Value Date   WBC 3.6 (L) 09/15/2017   HGB 12.7 09/15/2017   HCT 39.3 09/15/2017   PLT 349.0 09/15/2017   GLUCOSE 88 08/16/2019   CHOL 148 04/08/2019   TRIG 67 04/08/2019   HDL 48 04/08/2019   LDLDIRECT 140.5 08/12/2013   LDLCALC 87 04/08/2019   ALT 18 08/16/2019   AST 15 08/16/2019   NA 141 08/16/2019   K 4.1 08/16/2019   CL 103 08/16/2019   CREATININE 0.82 08/16/2019   BUN 20 08/16/2019   CO2 22 08/16/2019   TSH 2.530 04/08/2019   HGBA1C 5.1 08/16/2019   MICROALBUR <0.7 02/22/2015    MM DIGITAL SCREENING BILATERAL  Result Date: 08/17/2017 CLINICAL DATA:  Screening. EXAM: DIGITAL SCREENING BILATERAL MAMMOGRAM WITH CAD COMPARISON:  Previous exam(s). ACR Breast Density Category c: The breast tissue is heterogeneously dense, which may obscure small masses. FINDINGS: There are no findings suspicious for malignancy. Images were processed with CAD. IMPRESSION: No mammographic evidence of malignancy. A result letter of this screening mammogram will be mailed directly to the patient. RECOMMENDATION: Screening mammogram in one year. (Code:SM-B-01Y) BI-RADS CATEGORY  1: Negative. Electronically Signed   By: Franki Cabot M.D.   On: 08/17/2017 11:32     Assessment & Plan:  Plan  I am having Alicia Knapp maintain her ARIPiprazole, ARIPiprazole, atorvastatin, fluticasone, losartan, amLODipine, levocetirizine, hydrochlorothiazide, metFORMIN, Vitamin D (Ergocalciferol), and carvedilol.  Meds ordered this encounter  Medications  . carvedilol (COREG) 6.25 MG tablet    Sig: Take 1 tablet (6.25 mg total) by mouth 2 (two) times daily with a meal.    Dispense:  180 tablet    Refill:  1    Problem List Items Addressed This Visit      Unprioritized   Essential hypertension    Well controlled,  no changes to meds. Encouraged heart healthy diet such as the DASH diet and exercise as tolerated.        Relevant Medications   carvedilol (COREG) 6.25 MG tablet   Other Relevant Orders   Lipid panel   Comprehensive metabolic panel   Hyperlipidemia - Primary    Encouraged heart healthy diet, increase exercise, avoid trans fats, consider a krill oil cap daily      Relevant Medications   carvedilol (COREG) 6.25 MG tablet   Other Relevant Orders   Lipid panel   Comprehensive metabolic panel    Other Visit Diagnoses    Morbid obesity ([place])       Relevant Orders   Vitamin D (  25 hydroxy)   Insulin, random      Follow-up: Return in about 6 months (around 09/23/2020) for annual exam, fasting.  Ann Held, DO

## 2020-03-23 NOTE — Assessment & Plan Note (Signed)
Well controlled, no changes to meds. Encouraged heart healthy diet such as the DASH diet and exercise as tolerated.  °

## 2020-03-26 LAB — INSULIN, RANDOM: Insulin: 5.3 u[IU]/mL

## 2020-03-27 ENCOUNTER — Ambulatory Visit (INDEPENDENT_AMBULATORY_CARE_PROVIDER_SITE_OTHER): Admitting: Bariatrics

## 2020-03-29 ENCOUNTER — Encounter (INDEPENDENT_AMBULATORY_CARE_PROVIDER_SITE_OTHER): Payer: Self-pay | Admitting: Bariatrics

## 2020-03-29 ENCOUNTER — Ambulatory Visit (INDEPENDENT_AMBULATORY_CARE_PROVIDER_SITE_OTHER): Admitting: Bariatrics

## 2020-03-29 ENCOUNTER — Other Ambulatory Visit: Payer: Self-pay

## 2020-03-29 VITALS — BP 121/73 | HR 62 | Temp 98.3°F | Ht 66.0 in | Wt 175.0 lb

## 2020-03-29 DIAGNOSIS — E559 Vitamin D deficiency, unspecified: Secondary | ICD-10-CM

## 2020-03-29 DIAGNOSIS — I10 Essential (primary) hypertension: Secondary | ICD-10-CM | POA: Diagnosis not present

## 2020-03-29 DIAGNOSIS — E669 Obesity, unspecified: Secondary | ICD-10-CM

## 2020-03-29 DIAGNOSIS — E7849 Other hyperlipidemia: Secondary | ICD-10-CM

## 2020-03-29 DIAGNOSIS — Z6830 Body mass index (BMI) 30.0-30.9, adult: Secondary | ICD-10-CM

## 2020-03-29 NOTE — Progress Notes (Signed)
Chief Complaint:   OBESITY Alicia Knapp is here to discuss her progress with her obesity treatment plan along with follow-up of her obesity related diagnoses. [name] is on the Category 1 Plan and states Alicia Knapp is following her eating plan approximately 75% of the time. [name] states Alicia Knapp is doing strengthening/cardio 30-60 minutes 5 times per week.  Today's visit was #: 31 Starting weight: 212 lbs Starting date: 08/03/2018 Today's weight: 175 lbs Today's date: 03/29/2020 Total lbs lost to date: 37 Total lbs lost since last in-office visit: 0  Interim History: Alicia Knapp's weight remains the same, but Alicia Knapp has done very well. Alicia Knapp reports doing better with her water intake.  Subjective:   Essential hypertension. Alicia Knapp is taking HCTZ and Cozaar.  BP Readings from Last 3 Encounters:  03/29/20 121/73  03/23/20 120/70  02/28/20 138/74   Lab Results  Component Value Date   CREATININE 0.80 03/23/2020   CREATININE 0.82 08/16/2019   CREATININE 0.90 04/08/2019   Other hyperlipidemia. Alicia Knapp is taking Lipitor.  Lab Results  Component Value Date   CHOL 168 03/23/2020   HDL 43.80 03/23/2020   LDLCALC 114 (H) 03/23/2020   LDLDIRECT 140.5 08/12/2013   TRIG 51.0 03/23/2020   CHOLHDL 4 03/23/2020   Lab Results  Component Value Date   ALT 17 03/23/2020   AST 16 03/23/2020   ALKPHOS 61 03/23/2020   BILITOT 0.6 03/23/2020   The 10-year ASCVD risk score Alicia Bussing DC Jr., et al., 2013) is: 6.2%   Values used to calculate the score:     Age: 64 years     Sex: Female     Is Non-Hispanic African American: Yes     Diabetic: No     Tobacco smoker: No     Systolic Blood Pressure: 123XX123 mmHg     Is BP treated: Yes     HDL Cholesterol: 43.8 mg/dL     Total Cholesterol: 168 mg/dL  Vitamin D deficiency. Last Vitamin D 66.09 on 03/23/2020.  Assessment/Plan:   Essential hypertension. Stephen is working on healthy weight loss and exercise to improve blood pressure control. We will watch for signs of  hypotension as Alicia Knapp continues her lifestyle modifications. Alicia Knapp will continue her medications as directed.   Other hyperlipidemia. Cardiovascular risk and specific lipid/LDL goals reviewed.  We discussed several lifestyle modifications today and [name] will continue to work on diet, exercise and weight loss efforts. Orders and follow up as documented in patient record. [name] will continue Lipitor as directed.  Counseling Intensive lifestyle modifications are the first line treatment for this issue. . Dietary changes: Increase soluble fiber. Decrease simple carbohydrates. . Exercise changes: Moderate to vigorous-intensity aerobic activity 150 minutes per week if tolerated. . Lipid-lowering medications: see documented in medical record.  Vitamin D deficiency. Low Vitamin D level contributes to fatigue and are associated with obesity, breast, and colon cancer. Alicia Knapp agrees to continue to take OTC Vitamin D @ 2,000 IU daily and will follow-up for routine testing of Vitamin D, at least 2-3 times per year to avoid over-replacement.  Class 1 obesity with serious comorbidity and body mass index (BMI) of 30.0 to 30.9 in adult, unspecified obesity type - Starting BMI greater than 30.  Alicia Knapp is currently in the action stage of change. As such, her goal is to continue with weight loss efforts. Alicia Knapp has agreed to the Category 1 Plan.   Alicia Knapp will work on meal planning and intentional eating.   Exercise goals: All adults should avoid  inactivity. Some physical activity is better than none, and adults who participate in any amount of physical activity gain some health benefits.  Behavioral modification strategies: increasing lean protein intake, decreasing simple carbohydrates, increasing vegetables, increasing water intake, decreasing eating out, no skipping meals, meal planning and cooking strategies and keeping healthy foods in the home.  Alicia Knapp has agreed to follow-up with our clinic in 3-4 weeks. Alicia Knapp was informed of  the importance of frequent follow-up visits to maximize her success with intensive lifestyle modifications for her multiple health conditions.   Objective:   Blood pressure 121/73, pulse 62, temperature 98.3 F (36.8 C), temperature source Oral, height 5\' 6"  (1.676 m), weight 175 lb (79.4 kg), SpO2 99 %. Body mass index is 28.25 kg/m.  General: Cooperative, alert, well developed, in no acute distress. HEENT: Conjunctivae and lids unremarkable. Cardiovascular: Regular rhythm.  Lungs: Normal work of breathing. Neurologic: No focal deficits.   Lab Results  Component Value Date   CREATININE 0.80 03/23/2020   BUN 17 03/23/2020   NA 134 (L) 03/23/2020   K 4.1 03/23/2020   CL 100 03/23/2020   CO2 30 03/23/2020   Lab Results  Component Value Date   ALT 17 03/23/2020   AST 16 03/23/2020   ALKPHOS 61 03/23/2020   BILITOT 0.6 03/23/2020   Lab Results  Component Value Date   HGBA1C 5.1 08/16/2019   HGBA1C 5.4 04/08/2019   HGBA1C 5.5 11/09/2018   HGBA1C 5.7 05/18/2018   HGBA1C 5.6 12/21/2017   Lab Results  Component Value Date   INSULIN 10.4 08/16/2019   INSULIN 20.3 04/08/2019   INSULIN 13.8 11/09/2018   INSULIN 15.4 08/03/2018   Lab Results  Component Value Date   TSH 2.530 04/08/2019   Lab Results  Component Value Date   CHOL 168 03/23/2020   HDL 43.80 03/23/2020   LDLCALC 114 (H) 03/23/2020   LDLDIRECT 140.5 08/12/2013   TRIG 51.0 03/23/2020   CHOLHDL 4 03/23/2020   Lab Results  Component Value Date   WBC 3.6 (L) 09/15/2017   HGB 12.7 09/15/2017   HCT 39.3 09/15/2017   MCV 88.7 09/15/2017   PLT 349.0 09/15/2017   No results found for: IRON, TIBC, FERRITIN  Attestation Statements:   Reviewed by clinician on day of visit: allergies, medications, problem list, medical history, surgical history, family history, social history, and previous encounter notes.  Time spent on visit including pre-visit chart review and post-visit charting and care was 20 minutes.    Migdalia Dk, am acting as Location manager for [employer], DO   I have reviewed the above documentation for accuracy and completeness, and I agree with the above. Jearld Lesch, DO

## 2020-04-26 ENCOUNTER — Other Ambulatory Visit: Payer: Self-pay

## 2020-04-26 ENCOUNTER — Ambulatory Visit (INDEPENDENT_AMBULATORY_CARE_PROVIDER_SITE_OTHER): Admitting: Bariatrics

## 2020-04-26 ENCOUNTER — Encounter (INDEPENDENT_AMBULATORY_CARE_PROVIDER_SITE_OTHER): Payer: Self-pay | Admitting: Bariatrics

## 2020-04-26 VITALS — BP 133/76 | HR 65 | Temp 98.6°F | Ht 66.0 in | Wt 178.0 lb

## 2020-04-26 DIAGNOSIS — I10 Essential (primary) hypertension: Secondary | ICD-10-CM | POA: Diagnosis not present

## 2020-04-26 DIAGNOSIS — E8881 Metabolic syndrome: Secondary | ICD-10-CM | POA: Diagnosis not present

## 2020-04-26 DIAGNOSIS — Z9189 Other specified personal risk factors, not elsewhere classified: Secondary | ICD-10-CM | POA: Diagnosis not present

## 2020-04-26 DIAGNOSIS — E669 Obesity, unspecified: Secondary | ICD-10-CM | POA: Diagnosis not present

## 2020-04-26 DIAGNOSIS — Z6830 Body mass index (BMI) 30.0-30.9, adult: Secondary | ICD-10-CM

## 2020-04-26 MED ORDER — METFORMIN HCL 500 MG PO TABS: 500.0000 mg | ORAL_TABLET | Freq: Two times a day (BID) | ORAL | 0 refills | Status: DC

## 2020-04-30 ENCOUNTER — Encounter (INDEPENDENT_AMBULATORY_CARE_PROVIDER_SITE_OTHER): Payer: Self-pay | Admitting: Bariatrics

## 2020-04-30 NOTE — Progress Notes (Signed)
Chief Complaint:   OBESITY Alicia Knapp is here to discuss her progress with her obesity treatment plan along with follow-up of her obesity related diagnoses. Alicia Knapp is on the Category 1 Plan and states she is following her eating plan approximately 65% of the time. Alicia Knapp states she is doing cardio 60 minutes 3 times per week and strength training 30 minutes 2 times per week.  Today's visit was #: 1 Starting weight: 212 lbs Starting date: 08/03/2018 Today's weight: 178 lbs Today's date: 04/26/2020 Total lbs lost to date: 34 Total lbs lost since last in-office visit: 0  Interim History: Alicia Knapp is up 3 lbs. She states she stays in her caloric amount for her snacks. She is getting back on track and wants to get back to 173 lbs.  Subjective:   Insulin resistance. Alicia Knapp has a diagnosis of insulin resistance based on her elevated fasting insulin level >5. She continues to work on diet and exercise to decrease her risk of diabetes. Alicia Knapp is taking metformin.  Lab Results  Component Value Date   INSULIN 10.4 08/16/2019   INSULIN 20.3 04/08/2019   INSULIN 13.8 11/09/2018   INSULIN 15.4 08/03/2018   Lab Results  Component Value Date   HGBA1C 5.1 08/16/2019   Essential hypertension. Alicia Knapp is taking Coreg and Norvasc.  BP Readings from Last 3 Encounters:  04/26/20 133/76  03/29/20 121/73  03/23/20 120/70   Lab Results  Component Value Date   CREATININE 0.80 03/23/2020   CREATININE 0.82 08/16/2019   CREATININE 0.90 04/08/2019   At risk for diabetes mellitus. Alicia Knapp is at higher than average risk for developing diabetes due to insulin resistance.   Assessment/Plan:   Insulin resistance. Alicia Knapp will continue to work on weight loss, exercise, and decreasing simple carbohydrates to help decrease the risk of diabetes. Alicia Knapp agreed to follow-up with [name] as directed to closely monitor her progress. Prescription was given for metFORMIN (GLUCOPHAGE) 500 MG tablet BID with meals #60 with 0  refills.  Essential hypertension. Alicia Knapp is working on healthy weight loss and exercise to improve blood pressure control. We will watch for signs of hypotension as she continues her lifestyle modifications. She will continue her medications as directed.  At risk for diabetes mellitus. Alicia Knapp was given approximately 15 minutes of diabetes education and counseling today. We discussed intensive lifestyle modifications today with an emphasis on weight loss as well as increasing exercise and decreasing simple carbohydrates in her diet. We also reviewed medication options with an emphasis on risk versus benefit of those discussed.   Repetitive spaced learning was employed today to elicit superior memory formation and behavioral change.  Class 1 obesity with serious comorbidity and body mass index (BMI) of 30.0 to 30.9 in adult, unspecified obesity type - BMI greater than 30 at start.  Alicia Knapp is currently in the action stage of change. As such, her goal is to continue with weight loss efforts. She has agreed to the Category 1 Plan.   She will work on meal planning and intentional eating.   Exercise goals: Alicia Knapp will continue her cardio/strength training.  Behavioral modification strategies: increasing lean protein intake, decreasing simple carbohydrates, increasing vegetables, increasing water intake, decreasing eating out, no skipping meals, meal planning and cooking strategies, keeping healthy foods in the home and planning for success.  Alicia Knapp has agreed to follow-up with our clinic in 3 weeks. She was informed of the importance of frequent follow-up visits to maximize her success with intensive lifestyle modifications for her  multiple health conditions.   Objective:   Blood pressure 133/76, pulse 65, temperature 98.6 F (37 C), height 5\' 6"  (1.676 m), weight 178 lb (80.7 kg), SpO2 (!) 9 %. Body mass index is 28.73 kg/m.  General: Cooperative, alert, well developed, in no acute distress. HEENT:  Conjunctivae and lids unremarkable. Cardiovascular: Regular rhythm.  Lungs: Normal work of breathing. Neurologic: No focal deficits.   Lab Results  Component Value Date   CREATININE 0.80 03/23/2020   BUN 17 03/23/2020   NA 134 (L) 03/23/2020   K 4.1 03/23/2020   CL 100 03/23/2020   CO2 30 03/23/2020   Lab Results  Component Value Date   ALT 17 03/23/2020   AST 16 03/23/2020   ALKPHOS 61 03/23/2020   BILITOT 0.6 03/23/2020   Lab Results  Component Value Date   HGBA1C 5.1 08/16/2019   HGBA1C 5.4 04/08/2019   HGBA1C 5.5 11/09/2018   HGBA1C 5.7 05/18/2018   HGBA1C 5.6 12/21/2017   Lab Results  Component Value Date   INSULIN 10.4 08/16/2019   INSULIN 20.3 04/08/2019   INSULIN 13.8 11/09/2018   INSULIN 15.4 08/03/2018   Lab Results  Component Value Date   TSH 2.530 04/08/2019   Lab Results  Component Value Date   CHOL 168 03/23/2020   HDL 43.80 03/23/2020   LDLCALC 114 (H) 03/23/2020   LDLDIRECT 140.5 08/12/2013   TRIG 51.0 03/23/2020   CHOLHDL 4 03/23/2020   Lab Results  Component Value Date   WBC 3.6 (L) 09/15/2017   HGB 12.7 09/15/2017   HCT 39.3 09/15/2017   MCV 88.7 09/15/2017   PLT 349.0 09/15/2017   No results found for: IRON, TIBC, FERRITIN  Attestation Statements:   Reviewed by clinician on day of visit: allergies, medications, problem list, medical history, surgical history, family history, social history, and previous encounter notes.  Migdalia Dk, am acting as Location manager for [employer], DO   I have reviewed the above documentation for accuracy and completeness, and I agree with the above. Jearld Lesch, DO

## 2020-05-17 ENCOUNTER — Other Ambulatory Visit: Payer: Self-pay

## 2020-05-17 ENCOUNTER — Ambulatory Visit (INDEPENDENT_AMBULATORY_CARE_PROVIDER_SITE_OTHER): Admitting: Bariatrics

## 2020-05-17 ENCOUNTER — Encounter (INDEPENDENT_AMBULATORY_CARE_PROVIDER_SITE_OTHER): Payer: Self-pay | Admitting: Bariatrics

## 2020-05-17 VITALS — BP 132/75 | HR 63 | Temp 98.5°F | Ht 66.0 in | Wt 178.0 lb

## 2020-05-17 DIAGNOSIS — Z6830 Body mass index (BMI) 30.0-30.9, adult: Secondary | ICD-10-CM | POA: Diagnosis not present

## 2020-05-17 DIAGNOSIS — I10 Essential (primary) hypertension: Secondary | ICD-10-CM

## 2020-05-17 DIAGNOSIS — E669 Obesity, unspecified: Secondary | ICD-10-CM | POA: Diagnosis not present

## 2020-05-17 DIAGNOSIS — E7849 Other hyperlipidemia: Secondary | ICD-10-CM

## 2020-05-17 NOTE — Progress Notes (Signed)
Chief Complaint:   OBESITY Alicia Knapp is here to discuss her progress with her obesity treatment plan along with follow-up of her obesity related diagnoses. Alicia Knapp is on the Category 1 Plan and states she is following her eating plan approximately 75% of the time. Alicia Knapp states she is doing cardio 60 minutes 3 times per week and strengthening 30 minutes 2 times per week.  Today's visit was #: 65 Starting weight: 212 lbs Starting date: 08/03/2018 Today's weight: 178 lbs Today's date: 05/17/2020 Total lbs lost to date: 34 Total lbs lost since last in-office visit: 0  Interim History: Alicia Knapp's weight remains the same.  Subjective:   Other hyperlipidemia. Alicia Knapp is taking Lipitor.  Lab Results  Component Value Date   CHOL 168 03/23/2020   HDL 43.80 03/23/2020   LDLCALC 114 (H) 03/23/2020   LDLDIRECT 140.5 08/12/2013   TRIG 51.0 03/23/2020   CHOLHDL 4 03/23/2020   Lab Results  Component Value Date   ALT 17 03/23/2020   AST 16 03/23/2020   ALKPHOS 61 03/23/2020   BILITOT 0.6 03/23/2020   The 10-year ASCVD risk score [name] DC Jr., et al., 2013) is: 7.9%   Values used to calculate the score:     Age: 64 years     Sex: Female     Is Non-Hispanic African American: Yes     Diabetic: No     Tobacco smoker: No     Systolic Blood Pressure: 009 mmHg     Is BP treated: Yes     HDL Cholesterol: 43.8 mg/dL     Total Cholesterol: 168 mg/dL  Essential hypertension. Alicia Knapp is taking Coreg, HCTZ, Cozaar, and Norvasc. Blood pressure is controlled.  BP Readings from Last 3 Encounters:  05/17/20 132/75  04/26/20 133/76  03/29/20 121/73   Lab Results  Component Value Date   CREATININE 0.80 03/23/2020   CREATININE 0.82 08/16/2019   CREATININE 0.90 04/08/2019   Assessment/Plan:   Other hyperlipidemia. Cardiovascular risk and specific lipid/LDL goals reviewed.  We discussed several lifestyle modifications today and Alicia Knapp will continue to work on diet, exercise and weight loss efforts.  Orders and follow up as documented in patient record. Alicia Knapp will continue Lipitor as directed.  Counseling Intensive lifestyle modifications are the first line treatment for this issue. . Dietary changes: Increase soluble fiber. Decrease simple carbohydrates. . Exercise changes: Moderate to vigorous-intensity aerobic activity 150 minutes per week if tolerated. . Lipid-lowering medications: see documented in medical record.  Essential hypertension. Alicia Knapp is working on healthy weight loss and exercise to improve blood pressure control. We will watch for signs of hypotension as she continues her lifestyle modifications. She will continue her medications as directed.   Class 1 obesity with serious comorbidity and body mass index (BMI) of 30.0 to 30.9 in adult, unspecified obesity type - BMI greater than 30 @ start.  Alicia Knapp is currently in the action stage of change. As such, her goal is to continue with weight loss efforts. She has agreed to the Category 1 Plan.   Handout was provided on the new version of Eating Out.  Exercise goals: Alicia Knapp is doing cardio 60 minutes 3 times per week and strengthening 30 minutes 2 times per week.  Behavioral modification strategies: increasing lean protein intake, decreasing simple carbohydrates, increasing vegetables, increasing water intake, decreasing eating out, no skipping meals, meal planning and cooking strategies, keeping healthy foods in the home and planning for success.  Alicia Knapp has agreed to follow-up with our clinic  in 4-5 weeks. She was informed of the importance of frequent follow-up visits to maximize her success with intensive lifestyle modifications for her multiple health conditions.   Objective:   Blood pressure 132/75, pulse 63, temperature 98.5 F (36.9 C), height 5\' 6"  (1.676 m), weight 178 lb (80.7 kg), SpO2 99 %. Body mass index is 28.73 kg/m.  General: Cooperative, alert, well developed, in no acute distress. HEENT: Conjunctivae and lids  unremarkable. Cardiovascular: Regular rhythm.  Lungs: Normal work of breathing. Neurologic: No focal deficits.   Lab Results  Component Value Date   CREATININE 0.80 03/23/2020   BUN 17 03/23/2020   NA 134 (L) 03/23/2020   K 4.1 03/23/2020   CL 100 03/23/2020   CO2 30 03/23/2020   Lab Results  Component Value Date   ALT 17 03/23/2020   AST 16 03/23/2020   ALKPHOS 61 03/23/2020   BILITOT 0.6 03/23/2020   Lab Results  Component Value Date   HGBA1C 5.1 08/16/2019   HGBA1C 5.4 04/08/2019   HGBA1C 5.5 11/09/2018   HGBA1C 5.7 05/18/2018   HGBA1C 5.6 12/21/2017   Lab Results  Component Value Date   INSULIN 10.4 08/16/2019   INSULIN 20.3 04/08/2019   INSULIN 13.8 11/09/2018   INSULIN 15.4 08/03/2018   Lab Results  Component Value Date   TSH 2.530 04/08/2019   Lab Results  Component Value Date   CHOL 168 03/23/2020   HDL 43.80 03/23/2020   LDLCALC 114 (H) 03/23/2020   LDLDIRECT 140.5 08/12/2013   TRIG 51.0 03/23/2020   CHOLHDL 4 03/23/2020   Lab Results  Component Value Date   WBC 3.6 (L) 09/15/2017   HGB 12.7 09/15/2017   HCT 39.3 09/15/2017   MCV 88.7 09/15/2017   PLT 349.0 09/15/2017   No results found for: IRON, TIBC, FERRITIN  Attestation Statements:   Reviewed by clinician on day of visit: allergies, medications, problem list, medical history, surgical history, family history, social history, and previous encounter notes.  Time spent on visit including pre-visit chart review and post-visit charting and care was 20 minutes.   Migdalia Dk, am acting as Location manager for [employer], DO   I have reviewed the above documentation for accuracy and completeness, and I agree with the above. Jearld Lesch, DO

## 2020-05-21 ENCOUNTER — Encounter (INDEPENDENT_AMBULATORY_CARE_PROVIDER_SITE_OTHER): Payer: Self-pay | Admitting: Bariatrics

## 2020-06-15 ENCOUNTER — Other Ambulatory Visit: Payer: Self-pay | Admitting: Family Medicine

## 2020-06-15 DIAGNOSIS — E785 Hyperlipidemia, unspecified: Secondary | ICD-10-CM

## 2020-06-15 DIAGNOSIS — I10 Essential (primary) hypertension: Secondary | ICD-10-CM

## 2020-06-21 ENCOUNTER — Other Ambulatory Visit: Payer: Self-pay

## 2020-06-21 ENCOUNTER — Ambulatory Visit (INDEPENDENT_AMBULATORY_CARE_PROVIDER_SITE_OTHER): Admitting: Bariatrics

## 2020-06-21 ENCOUNTER — Encounter (INDEPENDENT_AMBULATORY_CARE_PROVIDER_SITE_OTHER): Payer: Self-pay | Admitting: Bariatrics

## 2020-06-21 VITALS — BP 141/85 | HR 68 | Temp 98.3°F | Ht 66.0 in | Wt 178.0 lb

## 2020-06-21 DIAGNOSIS — E7849 Other hyperlipidemia: Secondary | ICD-10-CM | POA: Diagnosis not present

## 2020-06-21 DIAGNOSIS — I10 Essential (primary) hypertension: Secondary | ICD-10-CM | POA: Diagnosis not present

## 2020-06-21 DIAGNOSIS — E669 Obesity, unspecified: Secondary | ICD-10-CM

## 2020-06-21 DIAGNOSIS — Z6830 Body mass index (BMI) 30.0-30.9, adult: Secondary | ICD-10-CM | POA: Diagnosis not present

## 2020-06-25 ENCOUNTER — Encounter (INDEPENDENT_AMBULATORY_CARE_PROVIDER_SITE_OTHER): Payer: Self-pay | Admitting: Bariatrics

## 2020-06-25 NOTE — Progress Notes (Signed)
Chief Complaint:   OBESITY Alicia Knapp is here to discuss her progress with her obesity treatment plan along with follow-up of her obesity related diagnoses. Alicia Knapp is on the Category 1 Plan and states she is following her eating plan approximately 75% of the time. Alicia Knapp states she is doing strengthening 30 minutes 2 times per week and cardio 60 minutes 3 times per week.  Today's visit was #: 66 Starting weight: 212 lbs Starting date: 08/03/2018 Today's weight: 178 lbs Today's date: 06/21/2020 Total lbs lost to date: 34 Total lbs lost since last in-office visit: 0  Interim History: Alicia Knapp's weight remains the same.  Subjective:   Essential hypertension. Alicia Knapp is taking Norvasc and HCTZ.  BP Readings from Last 3 Encounters:  06/21/20 (!) 141/85  05/17/20 132/75  04/26/20 133/76   Lab Results  Component Value Date   CREATININE 0.80 03/23/2020   CREATININE 0.82 08/16/2019   CREATININE 0.90 04/08/2019   Other hyperlipidemia. [name] is taking Lipitor.    Lab Results  Component Value Date   CHOL 168 03/23/2020   HDL 43.80 03/23/2020   LDLCALC 114 (H) 03/23/2020   LDLDIRECT 140.5 08/12/2013   TRIG 51.0 03/23/2020   CHOLHDL 4 03/23/2020   Lab Results  Component Value Date   ALT 17 03/23/2020   AST 16 03/23/2020   ALKPHOS 61 03/23/2020   BILITOT 0.6 03/23/2020   The 10-year ASCVD risk score [name] DC Jr., et al., 2013) is: 9.4%   Values used to calculate the score:     Age: 64 years     Sex: Female     Is Non-Hispanic African American: Yes     Diabetic: No     Tobacco smoker: No     Systolic Blood Pressure: 762 mmHg     Is BP treated: Yes     HDL Cholesterol: 43.8 mg/dL     Total Cholesterol: 168 mg/dL  Assessment/Plan:   Essential hypertension.  Gaytha is working on healthy weight loss and exercise to improve blood pressure control. We will watch for signs of hypotension as she continues her lifestyle modifications. She will continue her medications as directed.    Other hyperlipidemia. Cardiovascular risk and specific lipid/LDL goals reviewed.  We discussed several lifestyle modifications today and Alicia Knapp will continue to work on diet, exercise and weight loss efforts. Orders and follow up as documented in patient record. Alicia Knapp will continue Lipitor as directed. She will avoid trans fats and increase PUFA's and MUFA's.  Counseling Intensive lifestyle modifications are the first line treatment for this issue. . Dietary changes: Increase soluble fiber. Decrease simple carbohydrates. . Exercise changes: Moderate to vigorous-intensity aerobic activity 150 minutes per week if tolerated.  . Lipid-lowering medications: see documented in medical record.  Class 1 obesity with serious comorbidity and body mass index (BMI) of 30.0 to 30.9 in adult, unspecified obesity type - BMI greater than 30 at start.  Alicia Knapp is currently in the action stage of change. As such, her goal is to continue with weight loss efforts. She has agreed to the Category 1 Plan.   She will work on meal planning and intentional eating.   Exercise goals: All adults should avoid inactivity. Some physical activity is better than none, and adults who participate in any amount of physical activity gain some health benefits.  Behavioral modification strategies: increasing lean protein intake, decreasing simple carbohydrates, increasing vegetables, increasing water intake, decreasing eating out, no skipping meals, meal planning and cooking strategies, keeping healthy  foods in the home and planning for success.  Alicia Knapp has agreed to follow-up with our clinic in 2-3 weeks. She was informed of the importance of frequent follow-up visits to maximize her success with intensive lifestyle modifications for her multiple health conditions.   Objective:   Blood pressure (!) 141/85, pulse 68, temperature 98.3 F (36.8 C), height 5\' 6"  (1.676 m), weight 178 lb (80.7 kg), SpO2 100 %. Body mass index is 28.73  kg/m.  General: Cooperative, alert, well developed, in no acute distress. HEENT: Conjunctivae and lids unremarkable. Cardiovascular: Regular rhythm.  Lungs: Normal work of breathing. Neurologic: No focal deficits.   Lab Results  Component Value Date   CREATININE 0.80 03/23/2020   BUN 17 03/23/2020   NA 134 (L) 03/23/2020   K 4.1 03/23/2020   CL 100 03/23/2020   CO2 30 03/23/2020   Lab Results  Component Value Date   ALT 17 03/23/2020   AST 16 03/23/2020   ALKPHOS 61 03/23/2020   BILITOT 0.6 03/23/2020   Lab Results  Component Value Date   HGBA1C 5.1 08/16/2019   HGBA1C 5.4 04/08/2019   HGBA1C 5.5 11/09/2018   HGBA1C 5.7 05/18/2018   HGBA1C 5.6 12/21/2017   Lab Results  Component Value Date   INSULIN 10.4 08/16/2019   INSULIN 20.3 04/08/2019   INSULIN 13.8 11/09/2018   INSULIN 15.4 08/03/2018   Lab Results  Component Value Date   TSH 2.530 04/08/2019   Lab Results  Component Value Date   CHOL 168 03/23/2020   HDL 43.80 03/23/2020   LDLCALC 114 (H) 03/23/2020   LDLDIRECT 140.5 08/12/2013   TRIG 51.0 03/23/2020   CHOLHDL 4 03/23/2020   Lab Results  Component Value Date   WBC 3.6 (L) 09/15/2017   HGB 12.7 09/15/2017   HCT 39.3 09/15/2017   MCV 88.7 09/15/2017   PLT 349.0 09/15/2017   No results found for: IRON, TIBC, FERRITIN  Attestation Statements:   Reviewed by clinician on day of visit: allergies, medications, problem list, medical history, surgical history, family history, social history, and previous encounter notes.  Time spent on visit including pre-visit chart review and post-visit charting and care was 20 minutes.   Migdalia Dk, am acting as Location manager for [employer], DO   I have reviewed the above documentation for accuracy and completeness, and I agree with the above. Jearld Lesch, DO

## 2020-07-05 ENCOUNTER — Ambulatory Visit (INDEPENDENT_AMBULATORY_CARE_PROVIDER_SITE_OTHER): Admitting: Bariatrics

## 2020-07-05 ENCOUNTER — Other Ambulatory Visit: Payer: Self-pay

## 2020-07-05 ENCOUNTER — Encounter (INDEPENDENT_AMBULATORY_CARE_PROVIDER_SITE_OTHER): Payer: Self-pay | Admitting: Bariatrics

## 2020-07-05 VITALS — BP 134/80 | HR 62 | Temp 98.2°F | Ht 66.0 in | Wt 179.0 lb

## 2020-07-05 DIAGNOSIS — E8881 Metabolic syndrome: Secondary | ICD-10-CM | POA: Diagnosis not present

## 2020-07-05 DIAGNOSIS — E669 Obesity, unspecified: Secondary | ICD-10-CM

## 2020-07-05 DIAGNOSIS — Z6830 Body mass index (BMI) 30.0-30.9, adult: Secondary | ICD-10-CM

## 2020-07-05 DIAGNOSIS — I10 Essential (primary) hypertension: Secondary | ICD-10-CM | POA: Diagnosis not present

## 2020-07-05 MED ORDER — METFORMIN HCL 500 MG PO TABS: 500.0000 mg | ORAL_TABLET | Freq: Two times a day (BID) | ORAL | 0 refills | Status: DC

## 2020-07-05 NOTE — Progress Notes (Signed)
Chief Complaint:   OBESITY Alicia Knapp is here to discuss her progress with her obesity treatment plan along with follow-up of her obesity related diagnoses. Alicia Knapp is on the Category 1 Plan and states she is following her eating plan approximately 75% of the time. Alicia Knapp states she is doing cardio 30-60 minutes 5 times per week and strengthening 30 minutes 2 times per week.  Today's visit was #: 66 Starting weight: 212 lbs Starting date: 08/03/2018 Today's weight: 179 lbs Today's date: 07/05/2020 Total lbs lost to date: 33 Total lbs lost since last in-office visit: 0  Interim History: Alicia Knapp is up 1 lb. She is following the plan 75%, but does better if 85-90% of the plan.  Subjective:   Insulin resistance. Alicia Knapp has a diagnosis of insulin resistance based on her elevated fasting insulin level >5. She continues to work on diet and exercise to decrease her risk of diabetes. No polyphagia.  Lab Results  Component Value Date   INSULIN 10.4 08/16/2019   INSULIN 20.3 04/08/2019   INSULIN 13.8 11/09/2018   INSULIN 15.4 08/03/2018   Lab Results  Component Value Date   HGBA1C 5.1 08/16/2019   Essential hypertension. Alicia Knapp is taking Norvasc, Coreg, HCTZ, and Cozaar.  BP Readings from Last 3 Encounters:  07/05/20 134/80  06/21/20 (!) 141/85  05/17/20 132/75   Lab Results  Component Value Date   CREATININE 0.80 03/23/2020   CREATININE 0.82 08/16/2019   CREATININE 0.90 04/08/2019   Assessment/Plan:   Insulin resistance. Alicia Knapp will continue to work on weight loss, exercise, and decreasing simple carbohydrates to help decrease the risk of diabetes. Alicia Knapp agreed to follow-up with [name] as directed to closely monitor her progress. Prescription was given for metFORMIN (GLUCOPHAGE) 500 MG tablet 1 tablet BID with meals #60 with 0 refills.  Essential hypertension. Alicia Knapp is working on healthy weight loss and exercise to improve blood pressure control. We will watch for signs of hypotension as she  continues her lifestyle modifications. She will continue her medications as directed.   Class 1 obesity with serious comorbidity and body mass index (BMI) of 30.0 to 30.9 in adult, unspecified obesity type.  Alicia Knapp is currently in the action stage of change. As such, her goal is to continue with weight loss efforts. She has agreed to the Category 1 Plan.   She will work on meal planning and intentional eating.   Exercise goals: Alicia Knapp will continue her current exercise regimen.    Behavioral modification strategies: increasing lean protein intake, decreasing simple carbohydrates, increasing vegetables, increasing water intake, decreasing eating out, no skipping meals, meal planning and cooking strategies, keeping healthy foods in the home and planning for success.  Alicia Knapp has agreed to follow-up with our clinic fasting in 2-3 weeks. She was informed of the importance of frequent follow-up visits to maximize her success with intensive lifestyle modifications for her multiple health conditions.   Objective:   Blood pressure 134/80, pulse 62, temperature 98.2 F (36.8 C), height 5\' 6"  (1.676 m), weight 179 lb (81.2 kg), SpO2 100 %. Body mass index is 28.89 kg/m.  General: Cooperative, alert, well developed, in no acute distress. HEENT: Conjunctivae and lids unremarkable. Cardiovascular: Regular rhythm.  Lungs: Normal work of breathing. Neurologic: No focal deficits.   Lab Results  Component Value Date   CREATININE 0.80 03/23/2020   BUN 17 03/23/2020   NA 134 (L) 03/23/2020   K 4.1 03/23/2020   CL 100 03/23/2020   CO2 30 03/23/2020  Lab Results  Component Value Date   ALT 17 03/23/2020   AST 16 03/23/2020   ALKPHOS 61 03/23/2020   BILITOT 0.6 03/23/2020   Lab Results  Component Value Date   HGBA1C 5.1 08/16/2019   HGBA1C 5.4 04/08/2019   HGBA1C 5.5 11/09/2018   HGBA1C 5.7 05/18/2018   HGBA1C 5.6 12/21/2017   Lab Results  Component Value Date   INSULIN 10.4 08/16/2019    INSULIN 20.3 04/08/2019   INSULIN 13.8 11/09/2018   INSULIN 15.4 08/03/2018   Lab Results  Component Value Date   TSH 2.530 04/08/2019   Lab Results  Component Value Date   CHOL 168 03/23/2020   HDL 43.80 03/23/2020   LDLCALC 114 (H) 03/23/2020   LDLDIRECT 140.5 08/12/2013   TRIG 51.0 03/23/2020   CHOLHDL 4 03/23/2020   Lab Results  Component Value Date   WBC 3.6 (L) 09/15/2017   HGB 12.7 09/15/2017   HCT 39.3 09/15/2017   MCV 88.7 09/15/2017   PLT 349.0 09/15/2017   No results found for: IRON, TIBC, FERRITIN  Attestation Statements:   Reviewed by clinician on day of visit: allergies, medications, problem list, medical history, surgical history, family history, social history, and previous encounter notes.  Migdalia Dk, am acting as Location manager for [employer], DO   I have reviewed the above documentation for accuracy and completeness, and I agree with the above. Jearld Lesch, DO

## 2020-07-10 ENCOUNTER — Encounter (INDEPENDENT_AMBULATORY_CARE_PROVIDER_SITE_OTHER): Payer: Self-pay | Admitting: Bariatrics

## 2020-07-19 ENCOUNTER — Ambulatory Visit (INDEPENDENT_AMBULATORY_CARE_PROVIDER_SITE_OTHER): Admitting: Bariatrics

## 2020-07-19 ENCOUNTER — Encounter (INDEPENDENT_AMBULATORY_CARE_PROVIDER_SITE_OTHER): Payer: Self-pay | Admitting: Bariatrics

## 2020-07-19 ENCOUNTER — Other Ambulatory Visit: Payer: Self-pay

## 2020-07-19 VITALS — BP 138/70 | HR 62 | Temp 98.5°F | Ht 66.0 in | Wt 179.0 lb

## 2020-07-19 DIAGNOSIS — E669 Obesity, unspecified: Secondary | ICD-10-CM | POA: Diagnosis not present

## 2020-07-19 DIAGNOSIS — E8881 Metabolic syndrome: Secondary | ICD-10-CM

## 2020-07-19 DIAGNOSIS — I10 Essential (primary) hypertension: Secondary | ICD-10-CM

## 2020-07-19 DIAGNOSIS — Z6830 Body mass index (BMI) 30.0-30.9, adult: Secondary | ICD-10-CM

## 2020-07-24 ENCOUNTER — Encounter (INDEPENDENT_AMBULATORY_CARE_PROVIDER_SITE_OTHER): Payer: Self-pay | Admitting: Bariatrics

## 2020-07-24 NOTE — Progress Notes (Signed)
Chief Complaint:   OBESITY Alicia Knapp is here to discuss her progress with her obesity treatment plan along with follow-up of her obesity related diagnoses. [name] is on the Category 1 Plan and states she is following her eating plan approximately 75% of the time. [name] states she is doing cardio 30 minutes 5 times per week and strengthening 60 minutes 2 times per week.  Today's visit was #: 63 Starting weight: 212 lbs Starting date: 08/03/2018 Today's weight: 179 lbs Today's date: 07/19/2020 Total lbs lost to date: 33 Total lbs lost since last in-office visit: 0  Interim History: [name]'s weight remains the same. She is eating more and working out more.  Subjective:   Insulin resistance. [name] has a diagnosis of insulin resistance based on her elevated fasting insulin level >5. She continues to work on diet and exercise to decrease her risk of diabetes. [name] is taking metformin.  Lab Results  Component Value Date   INSULIN 10.4 08/16/2019   INSULIN 20.3 04/08/2019   INSULIN 13.8 11/09/2018   INSULIN 15.4 08/03/2018   Lab Results  Component Value Date   HGBA1C 5.1 08/16/2019   Essential hypertension. Blood pressure is controlled.  BP Readings from Last 3 Encounters:  07/19/20 138/70  07/05/20 134/80  06/21/20 (!) 141/85   Lab Results  Component Value Date   CREATININE 0.80 03/23/2020   CREATININE 0.82 08/16/2019   CREATININE 0.90 04/08/2019   Assessment/Plan:   Insulin resistance. [name] will continue to work on weight loss, exercise, and decreasing simple carbohydrates to help decrease the risk of diabetes. [name] agreed to follow-up with [name] as directed to closely monitor her progress. She will continue metformin as directed.   Essential hypertension. [name] is working on healthy weight loss and exercise to improve blood pressure control. We will watch for signs of hypotension as she continues her lifestyle modifications. She will continue her medication as directed.    Class 1 obesity with serious comorbidity and body mass index (BMI) of 30.0 to 30.9 in adult, unspecified obesity type - BMI greater than 30 at start.  [name] is currently in the action stage of change. As such, her goal is to continue with weight loss efforts. She has agreed to the Category 2 Plan.   She will work on meal planning and intentional eating.   Exercise goals: All adults should avoid inactivity. Some physical activity is better than none, and adults who participate in any amount of physical activity gain some health benefits.  Behavioral modification strategies: increasing lean protein intake, decreasing simple carbohydrates, increasing vegetables, increasing water intake, decreasing eating out, no skipping meals, meal planning and cooking strategies, keeping healthy foods in the home and planning for success.  [name] has agreed to follow-up with our clinic fasting in 2 weeks. She was informed of the importance of frequent follow-up visits to maximize her success with intensive lifestyle modifications for her multiple health conditions.   Objective:   Blood pressure 138/70, pulse 62, temperature 98.5 F (36.9 C), height 5\' 6"  (1.676 m), weight 179 lb (81.2 kg), SpO2 100 %. Body mass index is 28.89 kg/m.  General: Cooperative, alert, well developed, in no acute distress. HEENT: Conjunctivae and lids unremarkable. Cardiovascular: Regular rhythm.  Lungs: Normal work of breathing. Neurologic: No focal deficits.   Lab Results  Component Value Date   CREATININE 0.80 03/23/2020   BUN 17 03/23/2020   NA 134 (L) 03/23/2020   K 4.1 03/23/2020   CL 100 03/23/2020  CO2 30 03/23/2020   Lab Results  Component Value Date   ALT 17 03/23/2020   AST 16 03/23/2020   ALKPHOS 61 03/23/2020   BILITOT 0.6 03/23/2020   Lab Results  Component Value Date   HGBA1C 5.1 08/16/2019   HGBA1C 5.4 04/08/2019   HGBA1C 5.5 11/09/2018   HGBA1C 5.7 05/18/2018   HGBA1C 5.6 12/21/2017   Lab  Results  Component Value Date   INSULIN 10.4 08/16/2019   INSULIN 20.3 04/08/2019   INSULIN 13.8 11/09/2018   INSULIN 15.4 08/03/2018   Lab Results  Component Value Date   TSH 2.530 04/08/2019   Lab Results  Component Value Date   CHOL 168 03/23/2020   HDL 43.80 03/23/2020   LDLCALC 114 (H) 03/23/2020   LDLDIRECT 140.5 08/12/2013   TRIG 51.0 03/23/2020   CHOLHDL 4 03/23/2020   Lab Results  Component Value Date   WBC 3.6 (L) 09/15/2017   HGB 12.7 09/15/2017   HCT 39.3 09/15/2017   MCV 88.7 09/15/2017   PLT 349.0 09/15/2017   No results found for: IRON, TIBC, FERRITIN  Attestation Statements:   Reviewed by clinician on day of visit: allergies, medications, problem list, medical history, surgical history, family history, social history, and previous encounter notes.  Time spent on visit including pre-visit chart review and post-visit charting and care was 20 minutes.   Migdalia Dk, am acting as Location manager for [employer], DO   I have reviewed the above documentation for accuracy and completeness, and I agree with the above. Jearld Lesch, DO

## 2020-08-08 ENCOUNTER — Encounter (INDEPENDENT_AMBULATORY_CARE_PROVIDER_SITE_OTHER): Payer: Self-pay | Admitting: Family Medicine

## 2020-08-08 ENCOUNTER — Other Ambulatory Visit: Payer: Self-pay

## 2020-08-08 ENCOUNTER — Ambulatory Visit (INDEPENDENT_AMBULATORY_CARE_PROVIDER_SITE_OTHER): Admitting: Family Medicine

## 2020-08-08 VITALS — BP 132/76 | HR 65 | Temp 98.2°F | Ht 66.0 in | Wt 179.0 lb

## 2020-08-08 DIAGNOSIS — I10 Essential (primary) hypertension: Secondary | ICD-10-CM | POA: Diagnosis not present

## 2020-08-08 DIAGNOSIS — E669 Obesity, unspecified: Secondary | ICD-10-CM | POA: Diagnosis not present

## 2020-08-08 DIAGNOSIS — E7849 Other hyperlipidemia: Secondary | ICD-10-CM

## 2020-08-08 DIAGNOSIS — Z9189 Other specified personal risk factors, not elsewhere classified: Secondary | ICD-10-CM | POA: Diagnosis not present

## 2020-08-08 DIAGNOSIS — E8881 Metabolic syndrome: Secondary | ICD-10-CM | POA: Diagnosis not present

## 2020-08-08 DIAGNOSIS — Z6830 Body mass index (BMI) 30.0-30.9, adult: Secondary | ICD-10-CM

## 2020-08-08 MED ORDER — METFORMIN HCL 500 MG PO TABS: 500.0000 mg | ORAL_TABLET | Freq: Two times a day (BID) | ORAL | 0 refills | Status: DC

## 2020-08-08 NOTE — Progress Notes (Signed)
Chief Complaint:   OBESITY Alicia Knapp is here to discuss her progress with her obesity treatment plan along with follow-up of her obesity related diagnoses. [name] is on the Category 2 Plan and states she is following her eating plan approximately 50% of the time. [name] states she is doing cardio for 60 minutes 3 times per week, and doing weights for 30 minutes 2 times per week.  Today's visit was #: 76 Starting weight: 212 lbs Starting date: 08/03/2018 Today's weight: 179 lbs Today's date: 08/08/2020 Total lbs lost to date: 33 Total lbs lost since last in-office visit: 0  Interim History: [name] is plateaued at 179 lbs. She was starving on Category 1 and she does not want to be 200 lbs again. She voices she has concerns about the propensity for weight gain. She is doing well on category 2. Her goal weight is 168 lbs.  Subjective:   1. Insulin resistance [name] is on metformin and she denies GI side effects. Last labs were done in May 2021.  2. Other hyperlipidemia [name]'s last LDL was 114, and triglycerides 51.0.  3. Essential hypertension [name]'s blood pressure is well controlled. She denies chest pain, chest pressure, or headache. She is on hydrochlorothiazide, losartan, and amlodipine.  4. At risk for impaired metabolic function [name] is at increased risk for impaired metabolic function due to not eating enough to increase metabolism.  Assessment/Plan:   1. Insulin resistance [name] will continue to work on weight loss, exercise, and decreasing simple carbohydrates to help decrease the risk of diabetes. We will check labs today. We will refill metformin for 1 month. [name] agreed to follow-up with [name] as directed to closely monitor her progress.  - Hemoglobin A1c - Insulin, random - metFORMIN (GLUCOPHAGE) 500 MG tablet; Take 1 tablet (500 mg total) by mouth 2 (two) times daily with a meal.  Dispense: 60 tablet; Refill: 0  2. Other hyperlipidemia Cardiovascular risk and specific lipid/LDL  goals reviewed. We discussed several lifestyle modifications today. [name] will continue to work on diet, exercise and weight loss efforts. We will check labs today. Orders and follow up as documented in patient record.   Counseling Intensive lifestyle modifications are the first line treatment for this issue. . Dietary changes: Increase soluble fiber. Decrease simple carbohydrates. . Exercise changes: Moderate to vigorous-intensity aerobic activity 150 minutes per week if tolerated. . Lipid-lowering medications: see documented in medical record.  - Lipid Panel With LDL/HDL Ratio  3. Essential hypertension [name] is working on healthy weight loss and exercise to improve blood pressure control. We will watch for signs of hypotension as she continues her lifestyle modifications. We will check labs today.  - Comprehensive metabolic panel  4. At risk for impaired metabolic function [name] was given approximately 15 minutes of impaired  metabolic function prevention counseling today. We discussed intensive lifestyle modifications today with an emphasis on specific nutrition and exercise instructions and strategies.   Repetitive spaced learning was employed today to elicit superior memory formation and behavioral change.  5. Class 1 obesity with serious comorbidity and body mass index (BMI) of 30.0 to 30.9 in adult, unspecified obesity type [name] is currently in the action stage of change. As such, her goal is to continue with weight loss efforts. She has agreed to the Category 2 Plan.   Exercise goals: As is.  Behavioral modification strategies: increasing lean protein intake, meal planning and cooking strategies, keeping healthy foods in the home and planning for success.  [name] has  agreed to follow-up with our clinic in 2 to 3 weeks. She was informed of the importance of frequent follow-up visits to maximize her success with intensive lifestyle modifications for her multiple health conditions.    [name] was informed we would discuss her lab results at her next visit unless there is a critical issue that needs to be addressed sooner. [name] agreed to keep her next visit at the agreed upon time to discuss these results.  Objective:   Blood pressure 132/76, pulse 65, temperature 98.2 F (36.8 C), temperature source Oral, height 5\' 6"  (1.676 m), weight 179 lb (81.2 kg), SpO2 100 %. Body mass index is 28.89 kg/m.  General: Cooperative, alert, well developed, in no acute distress. HEENT: Conjunctivae and lids unremarkable. Cardiovascular: Regular rhythm.  Lungs: Normal work of breathing. Neurologic: No focal deficits.   Lab Results  Component Value Date   CREATININE 0.80 03/23/2020   BUN 17 03/23/2020   NA 134 (L) 03/23/2020   K 4.1 03/23/2020   CL 100 03/23/2020   CO2 30 03/23/2020   Lab Results  Component Value Date   ALT 17 03/23/2020   AST 16 03/23/2020   ALKPHOS 61 03/23/2020   BILITOT 0.6 03/23/2020   Lab Results  Component Value Date   HGBA1C 5.1 08/16/2019   HGBA1C 5.4 04/08/2019   HGBA1C 5.5 11/09/2018   HGBA1C 5.7 05/18/2018   HGBA1C 5.6 12/21/2017   Lab Results  Component Value Date   INSULIN 10.4 08/16/2019   INSULIN 20.3 04/08/2019   INSULIN 13.8 11/09/2018   INSULIN 15.4 08/03/2018   Lab Results  Component Value Date   TSH 2.530 04/08/2019   Lab Results  Component Value Date   CHOL 168 03/23/2020   HDL 43.80 03/23/2020   LDLCALC 114 (H) 03/23/2020   LDLDIRECT 140.5 08/12/2013   TRIG 51.0 03/23/2020   CHOLHDL 4 03/23/2020   Lab Results  Component Value Date   WBC 3.6 (L) 09/15/2017   HGB 12.7 09/15/2017   HCT 39.3 09/15/2017   MCV 88.7 09/15/2017   PLT 349.0 09/15/2017   No results found for: IRON, TIBC, FERRITIN  Attestation Statements:   Reviewed by clinician on day of visit: allergies, medications, problem list, medical history, surgical history, family history, social history, and previous encounter notes.   I, Trixie Dredge, am acting as transcriptionist for Coralie Common, MD.  I have reviewed the above documentation for accuracy and completeness, and I agree with the above. - Jinny Blossom, MD

## 2020-08-09 LAB — COMPREHENSIVE METABOLIC PANEL
ALT: 17 IU/L (ref 0–32)
AST: 15 IU/L (ref 0–40)
Albumin/Globulin Ratio: 1.6 (ref 1.2–2.2)
Albumin: 4.7 g/dL (ref 3.8–4.8)
Alkaline Phosphatase: 74 IU/L (ref 44–121)
BUN/Creatinine Ratio: 14 (ref 12–28)
BUN: 12 mg/dL (ref 8–27)
Bilirubin Total: 0.3 mg/dL (ref 0.0–1.2)
CO2: 26 mmol/L (ref 20–29)
Calcium: 10.1 mg/dL (ref 8.7–10.3)
Chloride: 105 mmol/L (ref 96–106)
Creatinine, Ser: 0.83 mg/dL (ref 0.57–1.00)
GFR calc Af Amer: 87 mL/min/{1.73_m2} (ref >59)
GFR calc non Af Amer: 76 mL/min/{1.73_m2} (ref >59)
Globulin, Total: 3 g/dL (ref 1.5–4.5)
Glucose: 96 mg/dL (ref 65–99)
Potassium: 4.9 mmol/L (ref 3.5–5.2)
Sodium: 145 mmol/L — ABNORMAL HIGH (ref 134–144)
Total Protein: 7.7 g/dL (ref 6.0–8.5)

## 2020-08-09 LAB — INSULIN, RANDOM: INSULIN: 20.6 u[IU]/mL (ref 2.6–24.9)

## 2020-08-09 LAB — HEMOGLOBIN A1C
Est. average glucose Bld gHb Est-mCnc: 105 mg/dL
Hgb A1c MFr Bld: 5.3 % (ref 4.8–5.6)

## 2020-08-09 LAB — LIPID PANEL WITH LDL/HDL RATIO
Cholesterol, Total: 149 mg/dL (ref 100–199)
HDL: 49 mg/dL (ref >39)
LDL Chol Calc (NIH): 84 mg/dL (ref 0–99)
LDL/HDL Ratio: 1.7 ratio (ref 0.0–3.2)
Triglycerides: 86 mg/dL (ref 0–149)
VLDL Cholesterol Cal: 16 mg/dL (ref 5–40)

## 2020-08-29 ENCOUNTER — Other Ambulatory Visit: Payer: Self-pay

## 2020-08-29 ENCOUNTER — Ambulatory Visit (INDEPENDENT_AMBULATORY_CARE_PROVIDER_SITE_OTHER): Admitting: Family Medicine

## 2020-08-29 ENCOUNTER — Encounter (INDEPENDENT_AMBULATORY_CARE_PROVIDER_SITE_OTHER): Payer: Self-pay | Admitting: Family Medicine

## 2020-08-29 VITALS — BP 142/70 | HR 63 | Temp 98.0°F | Ht 66.0 in | Wt 182.0 lb

## 2020-08-29 DIAGNOSIS — R7401 Elevation of levels of liver transaminase levels: Secondary | ICD-10-CM | POA: Diagnosis not present

## 2020-08-29 DIAGNOSIS — Z6830 Body mass index (BMI) 30.0-30.9, adult: Secondary | ICD-10-CM | POA: Diagnosis not present

## 2020-08-29 DIAGNOSIS — E7849 Other hyperlipidemia: Secondary | ICD-10-CM | POA: Diagnosis not present

## 2020-08-29 DIAGNOSIS — E669 Obesity, unspecified: Secondary | ICD-10-CM

## 2020-08-30 NOTE — Progress Notes (Signed)
Chief Complaint:   OBESITY Lollie Gunner is here to discuss her progress with her obesity treatment plan along with follow-up of her obesity related diagnoses. Arcelia is on the Category 2 Plan and states she is following her eating plan approximately 50% of the time. Graylyn states she is doing cardio 60 minutes 3 times per week and strengthening 30 minutes 2 times per week.  Today's visit was #: 27 Starting weight: 212 lbs Starting date: 08/03/2018 Today's weight: 182 lbs Today's date: 08/29/2020 Total lbs lost to date: 30 Total lbs lost since last in-office visit: 0  Interim History: Lenisha is getting bored with the Category 2 meal plan. She is occasionally incorporating increased amounts of carbohydrates. For breakfast she is eating Special K with strawberries and 80 calorie Fairlife milk or yogurt with toast and 1 cup cereal; Lunch is a microwave meal; Dinner is 6-7 oz meat, carbs, veggies; Snacks are nuts and pretzels.  Subjective:   Other hyperlipidemia. LDL 84; triglycerides 86; HDL 49. Keoni is not on a statin.   Lab Results  Component Value Date   CHOL 149 08/08/2020   HDL 49 08/08/2020   LDLCALC 84 08/08/2020   LDLDIRECT 140.5 08/12/2013   TRIG 86 08/08/2020   CHOLHDL 4 03/23/2020   Lab Results  Component Value Date   ALT 17 08/08/2020   AST 15 08/08/2020   ALKPHOS 74 08/08/2020   BILITOT 0.3 08/08/2020   The 10-year ASCVD risk score [name] DC Jr., et al., 2013) is: 8.1%   Values used to calculate the score:     Age: 64 years     Sex: Female     Is Non-Hispanic African American: Yes     Diabetic: No     Tobacco smoker: No     Systolic Blood Pressure: 413 mmHg     Is BP treated: Yes     HDL Cholesterol: 49 mg/dL     Total Cholesterol: 149 mg/dL  Transaminitis. LFT 's are improved on recent labs. Hepatitis C is negative.  Assessment/Plan:   Other hyperlipidemia. Cardiovascular risk and specific lipid/LDL goals reviewed.  We discussed several lifestyle  modifications today and Abigayle will continue to work on diet, exercise and weight loss efforts. Orders and follow up as documented in patient record. Labs will be checked in  February 2022.  Counseling Intensive lifestyle modifications are the first line treatment for this issue.  Dietary changes: Increase soluble fiber. Decrease simple carbohydrates.  Exercise changes: Moderate to vigorous-intensity aerobic activity 150 minutes per week if tolerated.  Lipid-lowering medications: see documented in medical record.  Transaminitis. Labs will be checked in February 2022.  Class 1 obesity with serious comorbidity and body mass index (BMI) of 30.0 to 30.9 in adult, unspecified obesity type - BMI greater than 30 at start of the program.  Lanitra is currently in the action stage of change. As such, her goal is to continue with weight loss efforts. She has agreed to the Category 2 Plan.   Exercise goals: Effie will continue her current exercise regimen.   Behavioral modification strategies: increasing lean protein intake, meal planning and cooking strategies, keeping healthy foods in the home and planning for success.  Brooks has agreed to follow-up with our clinic in 3 weeks. She was informed of the importance of frequent follow-up visits to maximize her success with intensive lifestyle modifications for her multiple health conditions.   Objective:   Blood pressure (!) 142/70, pulse 63, temperature 98 F (36.7  C), temperature source Oral, height 5\' 6"  (1.676 m), weight 182 lb (82.6 kg), SpO2 100 %. Body mass index is 29.38 kg/m.  General: Cooperative, alert, well developed, in no acute distress. HEENT: Conjunctivae and lids unremarkable. Cardiovascular: Regular rhythm.  Lungs: Normal work of breathing. Neurologic: No focal deficits.   Lab Results  Component Value Date   CREATININE 0.83 08/08/2020   BUN 12 08/08/2020   NA 145 (H) 08/08/2020   K 4.9 08/08/2020   CL 105 08/08/2020   CO2 26  08/08/2020   Lab Results  Component Value Date   ALT 17 08/08/2020   AST 15 08/08/2020   ALKPHOS 74 08/08/2020   BILITOT 0.3 08/08/2020   Lab Results  Component Value Date   HGBA1C 5.3 08/08/2020   HGBA1C 5.1 08/16/2019   HGBA1C 5.4 04/08/2019   HGBA1C 5.5 11/09/2018   HGBA1C 5.7 05/18/2018   Lab Results  Component Value Date   INSULIN 20.6 08/08/2020   INSULIN 10.4 08/16/2019   INSULIN 20.3 04/08/2019   INSULIN 13.8 11/09/2018   INSULIN 15.4 08/03/2018   Lab Results  Component Value Date   TSH 2.530 04/08/2019   Lab Results  Component Value Date   CHOL 149 08/08/2020   HDL 49 08/08/2020   LDLCALC 84 08/08/2020   LDLDIRECT 140.5 08/12/2013   TRIG 86 08/08/2020   CHOLHDL 4 03/23/2020   Lab Results  Component Value Date   WBC 3.6 (L) 09/15/2017   HGB 12.7 09/15/2017   HCT 39.3 09/15/2017   MCV 88.7 09/15/2017   PLT 349.0 09/15/2017   No results found for: IRON, TIBC, FERRITIN  Attestation Statements:   Reviewed by clinician on day of visit: allergies, medications, problem list, medical history, surgical history, family history, social history, and previous encounter notes.  Time spent on visit including pre-visit chart review and post-visit charting and care was 17 minutes.   I, Michaelene Song, am acting as transcriptionist for Coralie Common, MD   I have reviewed the above documentation for accuracy and completeness, and I agree with the above. - Jinny Blossom, MD

## 2020-09-13 ENCOUNTER — Other Ambulatory Visit: Payer: Self-pay | Admitting: Family Medicine

## 2020-09-13 DIAGNOSIS — I10 Essential (primary) hypertension: Secondary | ICD-10-CM

## 2020-09-17 ENCOUNTER — Ambulatory Visit (INDEPENDENT_AMBULATORY_CARE_PROVIDER_SITE_OTHER): Admitting: Family Medicine

## 2020-09-17 ENCOUNTER — Other Ambulatory Visit: Payer: Self-pay

## 2020-09-17 ENCOUNTER — Encounter (INDEPENDENT_AMBULATORY_CARE_PROVIDER_SITE_OTHER): Payer: Self-pay | Admitting: Family Medicine

## 2020-09-17 VITALS — BP 133/74 | HR 61 | Temp 98.3°F | Ht 66.0 in | Wt 181.0 lb

## 2020-09-17 DIAGNOSIS — I10 Essential (primary) hypertension: Secondary | ICD-10-CM | POA: Diagnosis not present

## 2020-09-17 DIAGNOSIS — E669 Obesity, unspecified: Secondary | ICD-10-CM | POA: Diagnosis not present

## 2020-09-17 DIAGNOSIS — Z6830 Body mass index (BMI) 30.0-30.9, adult: Secondary | ICD-10-CM

## 2020-09-17 DIAGNOSIS — E7849 Other hyperlipidemia: Secondary | ICD-10-CM | POA: Diagnosis not present

## 2020-09-20 NOTE — Progress Notes (Signed)
Chief Complaint:   OBESITY Henry is here to discuss her progress with her obesity treatment plan along with follow-up of her obesity related diagnoses. Abigal is on the Category 2 Plan and states she is following her eating plan approximately 75% of the time. Lavonna states she is doing strengthening and cardio for 30-60 minutes 2-3 times per week.  Today's visit was #: 3 Starting weight: 212 lbs Starting date: 08/03/2018 Today's weight: 181 lbs Today's date: 09/17/2020 Total lbs lost to date: 31 Total lbs lost since last in-office visit: 1  Interim History: Margree feeling like she is trying to keep her head above water. She is doing french toast, [employer] and working on increasing protein. Her sister is coming for Thanksgiving. She did get to eat out for [employer].  Subjective:   1. Essential hypertension Peony's blood pressure is well controlled. She denies chest pain, chest pressure, or headache. She is on Cozaar, hydrochlorothiazide, Coreg, and amlodipine.  2. Other hyperlipidemia Betty's last LDL was 84, HDL 49, and triglycerides 86. She is on Lipitor, and last LFTs were within normal limits.  Assessment/Plan:   1. Essential hypertension Calyse will continue her current medications, and will continue working on healthy weight loss and exercise to improve blood pressure control. We will watch for signs of hypotension as she continues her lifestyle modifications.  2. Other hyperlipidemia Cardiovascular risk and specific lipid/LDL goals reviewed. We discussed several lifestyle modifications today and Hajira will continue to work on diet, exercise and weight loss efforts. We will repeat labs in February 2022. Orders and follow up as documented in patient record.   Counseling Intensive lifestyle modifications are the first line treatment for this issue. . Dietary changes: Increase soluble fiber. Decrease simple carbohydrates. . Exercise changes: Moderate to vigorous-intensity  aerobic activity 150 minutes per week if tolerated. . Lipid-lowering medications: see documented in medical record.  3. Class 1 obesity with serious comorbidity and body mass index (BMI) of 30.0 to 30.9 in adult, unspecified obesity type Braelyn is currently in the action stage of change. As such, her goal is to continue with weight loss efforts. She has agreed to the Category 2 Plan.   Exercise goals: As is.  Behavioral modification strategies: increasing lean protein intake, meal planning and cooking strategies, keeping healthy foods in the home and planning for success.  Reshunda has agreed to follow-up with our clinic in 3 to 4 weeks. She was informed of the importance of frequent follow-up visits to maximize her success with intensive lifestyle modifications for her multiple health conditions.   Objective:   Blood pressure 133/74, pulse 61, temperature 98.3 F (36.8 C), temperature source Oral, height 5\' 6"  (1.676 m), weight 181 lb (82.1 kg), SpO2 100 %. Body mass index is 29.21 kg/m.  General: Cooperative, alert, well developed, in no acute distress. HEENT: Conjunctivae and lids unremarkable. Cardiovascular: Regular rhythm.  Lungs: Normal work of breathing. Neurologic: No focal deficits.   Lab Results  Component Value Date   CREATININE 0.83 08/08/2020   BUN 12 08/08/2020   NA 145 (H) 08/08/2020   K 4.9 08/08/2020   CL 105 08/08/2020   CO2 26 08/08/2020   Lab Results  Component Value Date   ALT 17 08/08/2020   AST 15 08/08/2020   ALKPHOS 74 08/08/2020   BILITOT 0.3 08/08/2020   Lab Results  Component Value Date   HGBA1C 5.3 08/08/2020   HGBA1C 5.1 08/16/2019   HGBA1C 5.4 04/08/2019   HGBA1C  5.5 11/09/2018   HGBA1C 5.7 05/18/2018   Lab Results  Component Value Date   INSULIN 20.6 08/08/2020   INSULIN 10.4 08/16/2019   INSULIN 20.3 04/08/2019   INSULIN 13.8 11/09/2018   INSULIN 15.4 08/03/2018   Lab Results  Component Value Date   TSH 2.530 04/08/2019   Lab  Results  Component Value Date   CHOL 149 08/08/2020   HDL 49 08/08/2020   LDLCALC 84 08/08/2020   LDLDIRECT 140.5 08/12/2013   TRIG 86 08/08/2020   CHOLHDL 4 03/23/2020   Lab Results  Component Value Date   WBC 3.6 (L) 09/15/2017   HGB 12.7 09/15/2017   HCT 39.3 09/15/2017   MCV 88.7 09/15/2017   PLT 349.0 09/15/2017   No results found for: IRON, TIBC, FERRITIN  Attestation Statements:   Reviewed by clinician on day of visit: allergies, medications, problem list, medical history, surgical history, family history, social history, and previous encounter notes.  Time spent on visit including pre-visit chart review and post-visit care and charting was 16 minutes.    I, Trixie Dredge, am acting as transcriptionist for Coralie Common, MD.  I have reviewed the above documentation for accuracy and completeness, and I agree with the above. - Jinny Blossom, MD

## 2020-09-25 ENCOUNTER — Other Ambulatory Visit (HOSPITAL_BASED_OUTPATIENT_CLINIC_OR_DEPARTMENT_OTHER): Payer: Self-pay | Admitting: Internal Medicine

## 2020-09-25 ENCOUNTER — Ambulatory Visit (INDEPENDENT_AMBULATORY_CARE_PROVIDER_SITE_OTHER): Admitting: Family Medicine

## 2020-09-25 ENCOUNTER — Other Ambulatory Visit: Payer: Self-pay

## 2020-09-25 ENCOUNTER — Ambulatory Visit: Attending: Internal Medicine

## 2020-09-25 ENCOUNTER — Other Ambulatory Visit: Payer: Self-pay | Admitting: Family Medicine

## 2020-09-25 ENCOUNTER — Encounter: Payer: Self-pay | Admitting: Family Medicine

## 2020-09-25 VITALS — BP 160/90 | HR 63 | Temp 98.3°F | Resp 18 | Ht 66.0 in | Wt 189.4 lb

## 2020-09-25 DIAGNOSIS — Z1231 Encounter for screening mammogram for malignant neoplasm of breast: Secondary | ICD-10-CM | POA: Diagnosis not present

## 2020-09-25 DIAGNOSIS — E2839 Other primary ovarian failure: Secondary | ICD-10-CM | POA: Diagnosis not present

## 2020-09-25 DIAGNOSIS — Z23 Encounter for immunization: Secondary | ICD-10-CM

## 2020-09-25 DIAGNOSIS — I10 Essential (primary) hypertension: Secondary | ICD-10-CM

## 2020-09-25 DIAGNOSIS — J302 Other seasonal allergic rhinitis: Secondary | ICD-10-CM

## 2020-09-25 DIAGNOSIS — E785 Hyperlipidemia, unspecified: Secondary | ICD-10-CM

## 2020-09-25 DIAGNOSIS — Z0000 Encounter for general adult medical examination without abnormal findings: Secondary | ICD-10-CM | POA: Diagnosis not present

## 2020-09-25 MED ORDER — FLUTICASONE PROPIONATE 50 MCG/ACT NA SUSP: 2.0000 | Freq: Every day | NASAL | 3 refills | Status: DC

## 2020-09-25 MED ORDER — AMLODIPINE BESYLATE 10 MG PO TABS: 10.0000 mg | ORAL_TABLET | Freq: Every day | ORAL | 3 refills | Status: DC

## 2020-09-25 MED ORDER — CARVEDILOL 6.25 MG PO TABS: 6.2500 mg | ORAL_TABLET | Freq: Two times a day (BID) | ORAL | 3 refills | Status: DC

## 2020-09-25 MED ORDER — ATORVASTATIN CALCIUM 10 MG PO TABS: 10.0000 mg | ORAL_TABLET | Freq: Every day | ORAL | 3 refills | Status: DC

## 2020-09-25 MED ORDER — HYDROCHLOROTHIAZIDE 25 MG PO TABS: 25.0000 mg | ORAL_TABLET | Freq: Every day | ORAL | 3 refills | Status: DC

## 2020-09-25 MED ORDER — LOSARTAN POTASSIUM 50 MG PO TABS: 50.0000 mg | ORAL_TABLET | Freq: Every day | ORAL | 3 refills | Status: DC

## 2020-09-25 MED ORDER — LEVOCETIRIZINE DIHYDROCHLORIDE 5 MG PO TABS: 5.0000 mg | ORAL_TABLET | Freq: Every evening | ORAL | 3 refills | Status: DC

## 2020-09-25 NOTE — Progress Notes (Signed)
° °  Covid-19 Vaccination Clinic  Name:  Alicia Knapp    MRN: [id number] DOB: August 13, 1956  09/25/2020  Alicia Knapp was observed post Covid-19 immunization for 15 minutes without incident. She was provided with Vaccine Information Sheet and instruction to access the V-Safe system.   Alicia Knapp was instructed to call 911 with any severe reactions post vaccine:  Difficulty breathing   Swelling of face and throat   A fast heartbeat   A bad rash all over body   Dizziness and weakness   Immunizations Administered    Name Date Dose VIS Date Route   Pfizer COVID-19 Vaccine 09/25/2020 10:32 AM 0.3 mL 08/22/2020 Intramuscular   Manufacturer: [place]   Lot: [id number]   [place]: [address]-5

## 2020-09-25 NOTE — Patient Instructions (Signed)

## 2020-09-25 NOTE — Progress Notes (Signed)
Subjective:     Alicia Knapp is a 64 y.o. female and is here for a comprehensive physical exam. The patient reports no problems.  Social History   Socioeconomic History  . Marital status: Married    Spouse name: [name]  . Number of children: Not on file  . Years of education: Not on file  . Highest education level: Not on file  Occupational History  . Occupation: Stay at home spouse  Tobacco Use  . Smoking status: Former Smoker    Types: Cigarettes    Quit date: 09/17/2014    Years since quitting: 6.0  . Smokeless tobacco: Never Used  Vaping Use  . Vaping Use: Never used  Substance and Sexual Activity  . Alcohol use: No    Alcohol/week: 0.0 standard drinks  . Drug use: No  . Sexual activity: Yes    Partners: Male  Other Topics Concern  . Not on file  Social History Narrative  . Not on file   Social Determinants of Health   Financial Resource Strain:   . Difficulty of Paying Living Expenses: Not on file  Food Insecurity:   . Worried About Charity fundraiser in the Last Year: Not on file  . Ran Out of Food in the Last Year: Not on file  Transportation Needs:   . Lack of Transportation (Medical): Not on file  . Lack of Transportation (Non-Medical): Not on file  Physical Activity:   . Days of Exercise per Week: Not on file  . Minutes of Exercise per Session: Not on file  Stress:   . Feeling of Stress : Not on file  Social Connections:   . Frequency of Communication with Friends and Family: Not on file  . Frequency of Social Gatherings with Friends and Family: Not on file  . Attends Religious Services: Not on file  . Active Member of Clubs or Organizations: Not on file  . Attends Archivist Meetings: Not on file  . Marital Status: Not on file  Intimate Partner Violence:   . Fear of Current or Ex-Partner: Not on file  . Emotionally Abused: Not on file  . Physically Abused: Not on file  . Sexually Abused: Not on file   Health Maintenance  Topic  Date Due  . HIV Screening  Never done  . PAP SMEAR-Modifier  02/21/2018  . MAMMOGRAM  08/14/2018  . INFLUENZA VACCINE  02/01/2027 (Originally 06/03/2020)  . COLONOSCOPY  06/28/2021  . TETANUS/TDAP  04/18/2026  . COVID-19 Vaccine  Completed  . Hepatitis C Screening  Completed    The following portions of the patient's history were reviewed and updated as appropriate: allergies, current medications, past family history, past medical history, past social history, past surgical history and problem list.  Review of Systems Review of Systems  Constitutional: Negative for activity change, appetite change and fatigue.  HENT: Negative for hearing loss, congestion, tinnitus and ear discharge.  dentist q8m Eyes: Negative for visual disturbance (see optho q1y -- vision corrected to 20/20 with glasses).  Respiratory: Negative for cough, chest tightness and shortness of breath.   Cardiovascular: Negative for chest pain, palpitations and leg swelling.  Gastrointestinal: Negative for abdominal pain, diarrhea, constipation and abdominal distention.  Genitourinary: Negative for urgency, frequency, decreased urine volume and difficulty urinating.  Musculoskeletal: Negative for back pain, arthralgias and gait problem.  Skin: Negative for color change, pallor and rash.  Neurological: Negative for dizziness, light-headedness, numbness and headaches.  Hematological: Negative for adenopathy. Does  not bruise/bleed easily.  Psychiatric/Behavioral: Negative for suicidal ideas, confusion, sleep disturbance, self-injury, dysphoric mood, decreased concentration and agitation.       Objective:    BP (!) 160/90 (BP Location: Left Arm, Patient Position: Sitting, Cuff Size: Normal)   Pulse 63   Temp 98.3 F (36.8 C) (Oral)   Resp 18   Ht 5\' 6"  (1.676 m)   Wt 189 lb 6.4 oz (85.9 kg)   SpO2 100%   BMI 30.57 kg/m  General appearance: alert, cooperative, appears stated age and no distress Head: Normocephalic,  without obvious abnormality, atraumatic Eyes: conjunctivae/corneas clear. PERRL, EOM's intact. Fundi benign. Ears: normal TM's and external ear canals both ears Neck: no adenopathy, no carotid bruit, no JVD, supple, symmetrical, trachea midline and thyroid not enlarged, symmetric, no tenderness/mass/nodules Back: symmetric, no curvature. ROM normal. No CVA tenderness. Lungs: clear to auscultation bilaterally Breasts: normal appearance, no masses or tenderness Heart: regular rate and rhythm, S1, S2 normal, no murmur, click, rub or gallop Abdomen: soft, non-tender; bowel sounds normal; no masses,  no organomegaly Pelvic: not indicated; status post hysterectomy, negative ROS Extremities: extremities normal, atraumatic, no cyanosis or edema Pulses: 2+ and symmetric Skin: Skin color, texture, turgor normal. No rashes or lesions Lymph nodes: Cervical, supraclavicular, and axillary nodes normal. Neurologic: Alert and oriented X 3, normal strength and tone. Normal symmetric reflexes. Normal coordination and gait    Assessment:    Healthy female exam.   Plan:     ghm utd Check labs  See After Visit Summary for Counseling Recommendations    1. Estrogen deficiency  - DG Bone Density; Future  2. Encounter for screening mammogram for malignant neoplasm of breast   - MM DIAG BREAST TOMO BILATERAL; Future  3. Essential hypertension Poorly controlled will alter medications, encouraged DASH diet, minimize caffeine and obtain adequate sleep. Report concerning symptoms and follow up as directed and as needed - losartan (COZAAR) 50 MG tablet; Take 1 tablet (50 mg total) by mouth daily.  Dispense: 90 tablet; Refill: 3 - hydrochlorothiazide (HYDRODIURIL) 25 MG tablet; Take 1 tablet (25 mg total) by mouth daily.  Dispense: 90 tablet; Refill: 3 - carvedilol (COREG) 6.25 MG tablet; Take 1 tablet (6.25 mg total) by mouth 2 (two) times daily with a meal.  Dispense: 180 tablet; Refill: 3 - amLODipine  (NORVASC) 10 MG tablet; Take 1 tablet (10 mg total) by mouth daily.  Dispense: 90 tablet; Refill: 3 Pt is losing weight and has not taken meds today --- will hold off on inc meds for now  Pt will con't to work with healthy weight and wellness 4. Seasonal allergic rhinitis stable - levocetirizine (XYZAL) 5 MG tablet; Take 1 tablet (5 mg total) by mouth every evening.  Dispense: 90 tablet; Refill: 3 - fluticasone (FLONASE) 50 MCG/ACT nasal spray; Place 2 sprays into both nostrils daily.  Dispense: 48 g; Refill: 3  5. Hyperlipidemia, unspecified hyperlipidemia type Encouraged heart healthy diet, increase exercise, avoid trans fats, consider a krill oil cap daily - atorvastatin (LIPITOR) 10 MG tablet; Take 1 tablet (10 mg total) by mouth daily.  Dispense: 90 tablet; Refill: 3 Lab Results  Component Value Date   CHOL 149 08/08/2020   HDL 49 08/08/2020   LDLCALC 84 08/08/2020   LDLDIRECT 140.5 08/12/2013   TRIG 86 08/08/2020   CHOLHDL 4 03/23/2020    6. Preventative health care See above

## 2020-09-26 MED FILL — PFIZER-BIONTECH COVID-19 VA: 30 | 1 days supply | Qty: 0 | Fill #0

## 2020-10-13 ENCOUNTER — Other Ambulatory Visit (INDEPENDENT_AMBULATORY_CARE_PROVIDER_SITE_OTHER): Payer: Self-pay | Admitting: Family Medicine

## 2020-10-13 DIAGNOSIS — E8881 Metabolic syndrome: Secondary | ICD-10-CM

## 2020-10-15 ENCOUNTER — Other Ambulatory Visit (INDEPENDENT_AMBULATORY_CARE_PROVIDER_SITE_OTHER): Payer: Self-pay | Admitting: Bariatrics

## 2020-10-15 DIAGNOSIS — E8881 Metabolic syndrome: Secondary | ICD-10-CM

## 2020-10-15 MED ORDER — METFORMIN HCL 500 MG PO TABS: 500.0000 mg | ORAL_TABLET | Freq: Two times a day (BID) | ORAL | 0 refills | Status: DC

## 2020-10-15 NOTE — Telephone Encounter (Signed)
Dr. U

## 2020-10-15 NOTE — Telephone Encounter (Signed)
Left msg for pt.

## 2020-10-15 NOTE — Telephone Encounter (Signed)
Pt needs refill sent to [facility]

## 2020-10-17 ENCOUNTER — Ambulatory Visit (INDEPENDENT_AMBULATORY_CARE_PROVIDER_SITE_OTHER): Admitting: Family Medicine

## 2020-10-17 ENCOUNTER — Other Ambulatory Visit: Payer: Self-pay

## 2020-10-17 ENCOUNTER — Encounter (INDEPENDENT_AMBULATORY_CARE_PROVIDER_SITE_OTHER): Payer: Self-pay | Admitting: Family Medicine

## 2020-10-17 VITALS — BP 127/72 | HR 67 | Temp 98.6°F | Ht 66.0 in | Wt 183.0 lb

## 2020-10-17 DIAGNOSIS — R7303 Prediabetes: Secondary | ICD-10-CM

## 2020-10-17 DIAGNOSIS — E66811 Obesity, class 1: Secondary | ICD-10-CM

## 2020-10-17 DIAGNOSIS — Z6830 Body mass index (BMI) 30.0-30.9, adult: Secondary | ICD-10-CM | POA: Diagnosis not present

## 2020-10-17 DIAGNOSIS — E6609 Other obesity due to excess calories: Secondary | ICD-10-CM | POA: Diagnosis not present

## 2020-10-17 DIAGNOSIS — I10 Essential (primary) hypertension: Secondary | ICD-10-CM

## 2020-10-17 NOTE — Progress Notes (Signed)
Chief Complaint:   OBESITY Alicia Knapp is here to discuss her progress with her obesity treatment plan along with follow-up of her obesity related diagnoses. Alicia Knapp is on the Category 2 Plan and states she is following her eating plan approximately 50% of the time. Alicia Knapp states she is doing cardio and strengthening for 30-60 minutes 2-3 times per week.  Today's visit was #: 62 Starting weight: 212 lbs Starting date: 08/03/2018 Today's weight: 183 lbs Today's date: 10/17/2020 Total lbs lost to date: 29 Total lbs lost since last in-office visit: 0  Interim History: Alicia Knapp voices that she wants to lose 8 lbs. She wasn't sure how to maintain her weight at 175 lbs. She stayed at home for Thanksgiving and planning to stay home with her husband for Christmas. Her biggest obstacle in the next few weeks is getting back on track. She is planning to get everything for Category 2 at the end of the month.  Subjective:   1. Pre-diabetes Blue's last A1c was 5.3 and insulin 20.6. She is on metformin BID.  2. Essential hypertension Alicia Knapp's blood pressure is well controlled. She denies chest pain, chest pressure, or headache.  Assessment/Plan:   1. Pre-diabetes Alicia Knapp will continue metformin, no refill needed. She will continue to work on weight loss, exercise, and decreasing simple carbohydrates to help decrease the risk of diabetes.   2. Essential hypertension Alicia Knapp will continue her current medications, and will continue working on healthy weight loss and exercise to improve blood pressure control. We will watch for signs of hypotension as she continues her lifestyle modifications.  3. Class 1 obesity due to excess calories with serious comorbidity and body mass index (BMI) of 30.0 to 30.9 in adult Alicia Knapp is currently in the action stage of change. As such, her goal is to continue with weight loss efforts. She has agreed to the Category 2 Plan.   We will repeat IC at her next appointment.  Exercise goals:  As is.  Behavioral modification strategies: increasing lean protein intake, meal planning and cooking strategies, keeping healthy foods in the home and holiday eating strategies .  Alicia Knapp has agreed to follow-up with our clinic in 3 weeks. She was informed of the importance of frequent follow-up visits to maximize her success with intensive lifestyle modifications for her multiple health conditions.   Objective:   Blood pressure 127/72, pulse 67, temperature 98.6 F (37 C), temperature source Oral, height 5\' 6"  (1.676 m), weight 183 lb (83 kg), SpO2 100 %. Body mass index is 29.54 kg/m.  General: Cooperative, alert, well developed, in no acute distress. HEENT: Conjunctivae and lids unremarkable. Cardiovascular: Regular rhythm.  Lungs: Normal work of breathing. Neurologic: No focal deficits.   Lab Results  Component Value Date   CREATININE 0.83 08/08/2020   BUN 12 08/08/2020   NA 145 (H) 08/08/2020   K 4.9 08/08/2020   CL 105 08/08/2020   CO2 26 08/08/2020   Lab Results  Component Value Date   ALT 17 08/08/2020   AST 15 08/08/2020   ALKPHOS 74 08/08/2020   BILITOT 0.3 08/08/2020   Lab Results  Component Value Date   HGBA1C 5.3 08/08/2020   HGBA1C 5.1 08/16/2019   HGBA1C 5.4 04/08/2019   HGBA1C 5.5 11/09/2018   HGBA1C 5.7 05/18/2018   Lab Results  Component Value Date   INSULIN 20.6 08/08/2020   INSULIN 10.4 08/16/2019   INSULIN 20.3 04/08/2019   INSULIN 13.8 11/09/2018   INSULIN 15.4 08/03/2018   Lab  Results  Component Value Date   TSH 2.530 04/08/2019   Lab Results  Component Value Date   CHOL 149 08/08/2020   HDL 49 08/08/2020   LDLCALC 84 08/08/2020   LDLDIRECT 140.5 08/12/2013   TRIG 86 08/08/2020   CHOLHDL 4 03/23/2020   Lab Results  Component Value Date   WBC 3.6 (L) 09/15/2017   HGB 12.7 09/15/2017   HCT 39.3 09/15/2017   MCV 88.7 09/15/2017   PLT 349.0 09/15/2017   No results found for: IRON, TIBC, FERRITIN  Attestation Statements:    Reviewed by clinician on day of visit: allergies, medications, problem list, medical history, surgical history, family history, social history, and previous encounter notes.  Time spent on visit including pre-visit chart review and post-visit care and charting was 15 minutes.    I, Trixie Dredge, am acting as transcriptionist for Coralie Common, MD.  I have reviewed the above documentation for accuracy and completeness, and I agree with the above. - Jinny Blossom, MD

## 2020-11-13 ENCOUNTER — Encounter (INDEPENDENT_AMBULATORY_CARE_PROVIDER_SITE_OTHER): Payer: Self-pay | Admitting: Family Medicine

## 2020-11-13 ENCOUNTER — Other Ambulatory Visit: Payer: Self-pay

## 2020-11-13 ENCOUNTER — Ambulatory Visit (INDEPENDENT_AMBULATORY_CARE_PROVIDER_SITE_OTHER): Admitting: Family Medicine

## 2020-11-13 VITALS — BP 134/71 | HR 69 | Temp 98.6°F | Ht 66.0 in | Wt 180.0 lb

## 2020-11-13 DIAGNOSIS — E8881 Metabolic syndrome: Secondary | ICD-10-CM

## 2020-11-13 DIAGNOSIS — E669 Obesity, unspecified: Secondary | ICD-10-CM | POA: Diagnosis not present

## 2020-11-13 DIAGNOSIS — R7303 Prediabetes: Secondary | ICD-10-CM | POA: Diagnosis not present

## 2020-11-13 DIAGNOSIS — R0602 Shortness of breath: Secondary | ICD-10-CM

## 2020-11-13 DIAGNOSIS — Z9189 Other specified personal risk factors, not elsewhere classified: Secondary | ICD-10-CM | POA: Diagnosis not present

## 2020-11-13 DIAGNOSIS — I10 Essential (primary) hypertension: Secondary | ICD-10-CM

## 2020-11-13 DIAGNOSIS — Z6830 Body mass index (BMI) 30.0-30.9, adult: Secondary | ICD-10-CM

## 2020-11-13 MED ORDER — METFORMIN HCL 500 MG PO TABS: 500.0000 mg | ORAL_TABLET | Freq: Two times a day (BID) | ORAL | 0 refills | Status: DC

## 2020-11-19 NOTE — Progress Notes (Signed)
Chief Complaint:   OBESITY Alicia Knapp is here to discuss her progress with her obesity treatment plan along with follow-up of her obesity related diagnoses. Alicia Knapp is on the Category 2 Plan and states she is following her eating plan approximately 85% of the time. Alicia Knapp states she is doing cardio for 60 minutes 3  times per week and weights for 30 minutes 2 times per week.  Today's visit was #: 65 Starting weight: 212 lbs Starting date: 08/03/2018 Today's weight: 180 lbs Today's date: 11/13/2020 Total lbs lost to date: 32 lbs Total lbs lost since last in-office visit: 0  Interim History: Alicia Knapp had a great holiday. She stayed home and ate seafood. Alicia Knapp got rid of snacks that were tempting to her. She is still exercising quite a bit. No plans for upcoming few weeks. Alicia Knapp is doing her apple after dinner to help with drive to snack.  Subjective:   1. SOB (shortness of breath) Minimal improvement from initial appointment. Her previous RMR was 1110. RMR today is 1065.  2. Essential hypertension Review: taking medications as instructed, no medication side effects noted, no chest pain on exertion, no dyspnea on exertion, no swelling of ankles. Blood pressure well controlled today. Caran is on Norvasc, Coreg, and Cozaar. She denies chest pain, chest pressure, or headache.  BP Readings from Last 3 Encounters:  11/13/20 134/71  10/17/20 127/72  09/25/20 (!) 160/90     3. Prediabetes Lana's last A1c was 5.3 and insulin 20.6. She is on metformin with improvement in labs and carb indulgences.  4. At risk for diabetes mellitus Yohana is at higher than average risk for developing diabetes due to obesity.     Assessment/Plan:   1. SOB (shortness of breath) Will do IC Today.  2. Essential hypertension Terrika will continue current medications. No refill needed.   3. Prediabetes - metFORMIN (GLUCOPHAGE) 500 MG tablet; Take 1 tablet (500 mg total) by mouth 2 (two) times daily with a meal.   Dispense: 60 tablet; Refill: 0  4. At risk for diabetes mellitus Briya was given approximately 15 minutes of diabetes education and counseling today. We discussed intensive lifestyle modifications today with an emphasis on weight loss as well as increasing exercise and decreasing simple carbohydrates in her diet. We also reviewed medication options with an emphasis on risk versus benefit of those discussed.   Repetitive spaced learning was employed today to elicit superior memory formation and behavioral change.  5. Class 1 obesity with serious comorbidity and body mass index (BMI) of 30.0 to 30.9 in adult, unspecified obesity type  Alicia Knapp is currently in the action stage of change. As such, her goal is to continue with weight loss efforts. She has agreed to the Category 2 Plan.   Exercise goals: Alicia Knapp will add additional resistance training day to her routine.  Behavioral modification strategies: increasing lean protein intake.  Alicia Knapp has agreed to follow-up with our clinic in 2 weeks. She was informed of the importance of frequent follow-up visits to maximize her success with intensive lifestyle modifications for her multiple health conditions.  Alicia Knapp was informed we would discuss her lab results at her next visit unless there is a critical issue that needs to be addressed sooner. Alicia Knapp agreed to keep her next visit at the agreed upon time to discuss these results.  Objective:   Blood pressure 134/71, pulse 69, temperature 98.6 F (37 C), temperature source Oral, height 5\' 6"  (1.676 m), weight 180 lb (81.6 kg), SpO2  99 %. Body mass index is 29.05 kg/m.  General: Cooperative, alert, well developed, in no acute distress. HEENT: Conjunctivae and lids unremarkable. Cardiovascular: Regular rhythm.  Lungs: Normal work of breathing. Neurologic: No focal deficits.   Lab Results  Component Value Date   CREATININE 0.83 08/08/2020   BUN 12 08/08/2020   NA 145 (H) 08/08/2020   K 4.9 08/08/2020    CL 105 08/08/2020   CO2 26 08/08/2020   Lab Results  Component Value Date   ALT 17 08/08/2020   AST 15 08/08/2020   ALKPHOS 74 08/08/2020   BILITOT 0.3 08/08/2020   Lab Results  Component Value Date   HGBA1C 5.3 08/08/2020   HGBA1C 5.1 08/16/2019   HGBA1C 5.4 04/08/2019   HGBA1C 5.5 11/09/2018   HGBA1C 5.7 05/18/2018   Lab Results  Component Value Date   INSULIN 20.6 08/08/2020   INSULIN 10.4 08/16/2019   INSULIN 20.3 04/08/2019   INSULIN 13.8 11/09/2018   INSULIN 15.4 08/03/2018   Lab Results  Component Value Date   TSH 2.530 04/08/2019   Lab Results  Component Value Date   CHOL 149 08/08/2020   HDL 49 08/08/2020   LDLCALC 84 08/08/2020   LDLDIRECT 140.5 08/12/2013   TRIG 86 08/08/2020   CHOLHDL 4 03/23/2020   Lab Results  Component Value Date   WBC 3.6 (L) 09/15/2017   HGB 12.7 09/15/2017   HCT 39.3 09/15/2017   MCV 88.7 09/15/2017   PLT 349.0 09/15/2017   No results found for: IRON, TIBC, FERRITIN    Attestation Statements:   Reviewed by clinician on day of visit: allergies, medications, problem list, medical history, surgical history, family history, social history, and previous encounter notes.    I, Para March, am acting as transcriptionist for Coralie Common, MD. I have reviewed the above documentation for accuracy and completeness, and I agree with the above. - Jinny Blossom, MD

## 2020-12-04 ENCOUNTER — Ambulatory Visit (INDEPENDENT_AMBULATORY_CARE_PROVIDER_SITE_OTHER): Admitting: Family Medicine

## 2020-12-04 ENCOUNTER — Encounter (INDEPENDENT_AMBULATORY_CARE_PROVIDER_SITE_OTHER): Payer: Self-pay | Admitting: Family Medicine

## 2020-12-04 ENCOUNTER — Other Ambulatory Visit: Payer: Self-pay

## 2020-12-04 VITALS — BP 129/69 | HR 60 | Temp 98.4°F | Ht 66.0 in | Wt 185.0 lb

## 2020-12-04 DIAGNOSIS — E669 Obesity, unspecified: Secondary | ICD-10-CM | POA: Diagnosis not present

## 2020-12-04 DIAGNOSIS — Z6830 Body mass index (BMI) 30.0-30.9, adult: Secondary | ICD-10-CM

## 2020-12-04 DIAGNOSIS — Z9189 Other specified personal risk factors, not elsewhere classified: Secondary | ICD-10-CM | POA: Diagnosis not present

## 2020-12-04 DIAGNOSIS — R7303 Prediabetes: Secondary | ICD-10-CM | POA: Diagnosis not present

## 2020-12-04 DIAGNOSIS — E559 Vitamin D deficiency, unspecified: Secondary | ICD-10-CM

## 2020-12-04 MED ORDER — TOPIRAMATE 25 MG PO TABS: 25.0000 mg | ORAL_TABLET | Freq: Every day | ORAL | 0 refills | Status: DC

## 2020-12-05 NOTE — Progress Notes (Signed)
Chief Complaint:   OBESITY Alicia Knapp is here to discuss her progress with her obesity treatment plan along with follow-up of her obesity related diagnoses. Alicia Knapp is on the Category 2 Plan and states she is following her eating plan approximately 65% of the time. Alicia Knapp states she is doing cardio and weights 30-60 minutes 3 times per week.  Today's visit was #: 24 Starting weight: 212 lbs Starting date: 08/03/2018 Today's weight: 185 lbs Today's date: 12/04/2020 Total lbs lost to date: 27 lbs Total lbs lost since last in-office visit: 0  Interim History: Pt is indulging in sweets like ginger snaps, ice cream, and she did have donuts a few nights from [employer]. Pt is most tempted to snack at night. Pt's birthday is coming up and she is going to go to [employer].  Subjective:   1. Pre-diabetes Last A1c was 5.3 and Insulin level 20.6. Pt is on Metformin BID. She denies GI side effects.  2. Vitamin D deficiency Pt is not on a supplement. She reports some fatigue. Last Vit D level of 66.  3. At risk for diabetes mellitus Seferina is at higher than average risk for developing diabetes due to obesity.   Assessment/Plan:   1. Pre-diabetes Alicia Knapp will continue to work on weight loss, exercise, and decreasing simple carbohydrates to help decrease the risk of diabetes. Continue Metformin with  No change in dose.  2. Vitamin D deficiency Low Vitamin D level contributes to fatigue and are associated with obesity, breast, and colon cancer. She agrees to follow-up for routine testing of Vitamin D, at least 2-3 times per year to avoid over-replacement. Repeat labs next month.  3. At risk for diabetes mellitus Alicia Knapp was given approximately 15 minutes of diabetes education and counseling today. We discussed intensive lifestyle modifications today with an emphasis on weight loss as well as increasing exercise and decreasing simple carbohydrates in her diet. We also reviewed medication options with an  emphasis on risk versus benefit of those discussed.   Repetitive spaced learning was employed today to elicit superior memory formation and behavioral change.  4. Class 1 obesity with serious comorbidity and body mass index (BMI) of 30.0 to 30.9 in adult, unspecified obesity type Alicia Knapp is currently in the action stage of change. As such, her goal is to continue with weight loss efforts. She has agreed to the Category 2 Plan.   We discussed various medication options to help Alicia Knapp with her weight loss efforts and we both agreed to start Topamax 25 mg.  - topiramate (TOPAMAX) 25 MG tablet; Take 1 tablet (25 mg total) by mouth at bedtime.  Dispense: 30 tablet; Refill: 0  Exercise goals: As is  Behavioral modification strategies: increasing lean protein intake, meal planning and cooking strategies, keeping healthy foods in the home and planning for success.  Mckenzie has agreed to follow-up with our clinic in 3 weeks. She was informed of the importance of frequent follow-up visits to maximize her success with intensive lifestyle modifications for her multiple health conditions.   Objective:   Blood pressure 129/69, pulse 60, temperature 98.4 F (36.9 C), temperature source Oral, height 5\' 6"  (1.676 m), weight 185 lb (83.9 kg), SpO2 (!) 60 %. Body mass index is 29.86 kg/m.  General: Cooperative, alert, well developed, in no acute distress. HEENT: Conjunctivae and lids unremarkable. Cardiovascular: Regular rhythm.  Lungs: Normal work of breathing. Neurologic: No focal deficits.   Lab Results  Component Value Date   CREATININE 0.83  08/08/2020   BUN 12 08/08/2020   NA 145 (H) 08/08/2020   K 4.9 08/08/2020   CL 105 08/08/2020   CO2 26 08/08/2020   Lab Results  Component Value Date   ALT 17 08/08/2020   AST 15 08/08/2020   ALKPHOS 74 08/08/2020   BILITOT 0.3 08/08/2020   Lab Results  Component Value Date   HGBA1C 5.3 08/08/2020   HGBA1C 5.1 08/16/2019   HGBA1C 5.4 04/08/2019    HGBA1C 5.5 11/09/2018   HGBA1C 5.7 05/18/2018   Lab Results  Component Value Date   INSULIN 20.6 08/08/2020   INSULIN 10.4 08/16/2019   INSULIN 20.3 04/08/2019   INSULIN 13.8 11/09/2018   INSULIN 15.4 08/03/2018   Lab Results  Component Value Date   TSH 2.530 04/08/2019   Lab Results  Component Value Date   CHOL 149 08/08/2020   HDL 49 08/08/2020   LDLCALC 84 08/08/2020   LDLDIRECT 140.5 08/12/2013   TRIG 86 08/08/2020   CHOLHDL 4 03/23/2020   Lab Results  Component Value Date   WBC 3.6 (L) 09/15/2017   HGB 12.7 09/15/2017   HCT 39.3 09/15/2017   MCV 88.7 09/15/2017   PLT 349.0 09/15/2017   No results found for: IRON, TIBC, FERRITIN  Attestation Statements:   Reviewed by clinician on day of visit: allergies, medications, problem list, medical history, surgical history, family history, social history, and previous encounter notes.  Coral Ceo, am acting as transcriptionist for Coralie Common, MD.   I have reviewed the above documentation for accuracy and completeness, and I agree with the above. - Jinny Blossom, MD

## 2020-12-25 ENCOUNTER — Ambulatory Visit (INDEPENDENT_AMBULATORY_CARE_PROVIDER_SITE_OTHER): Admitting: Family Medicine

## 2020-12-25 ENCOUNTER — Other Ambulatory Visit: Payer: Self-pay

## 2020-12-25 ENCOUNTER — Encounter (INDEPENDENT_AMBULATORY_CARE_PROVIDER_SITE_OTHER): Payer: Self-pay | Admitting: Family Medicine

## 2020-12-25 VITALS — BP 139/72 | HR 77 | Temp 97.6°F | Ht 66.0 in | Wt 181.0 lb

## 2020-12-25 DIAGNOSIS — Z9189 Other specified personal risk factors, not elsewhere classified: Secondary | ICD-10-CM | POA: Diagnosis not present

## 2020-12-25 DIAGNOSIS — E6609 Other obesity due to excess calories: Secondary | ICD-10-CM | POA: Diagnosis not present

## 2020-12-25 DIAGNOSIS — I10 Essential (primary) hypertension: Secondary | ICD-10-CM | POA: Diagnosis not present

## 2020-12-25 DIAGNOSIS — R7303 Prediabetes: Secondary | ICD-10-CM

## 2020-12-25 DIAGNOSIS — E782 Mixed hyperlipidemia: Secondary | ICD-10-CM

## 2020-12-25 DIAGNOSIS — Z6830 Body mass index (BMI) 30.0-30.9, adult: Secondary | ICD-10-CM

## 2020-12-25 MED ORDER — METFORMIN HCL 500 MG PO TABS: 500.0000 mg | ORAL_TABLET | Freq: Two times a day (BID) | ORAL | 0 refills | Status: DC

## 2020-12-26 LAB — COMPREHENSIVE METABOLIC PANEL
ALT: 18 IU/L (ref 0–32)
AST: 17 IU/L (ref 0–40)
Albumin/Globulin Ratio: 1.5 (ref 1.2–2.2)
Albumin: 4.8 g/dL (ref 3.8–4.8)
Alkaline Phosphatase: 75 IU/L (ref 44–121)
BUN/Creatinine Ratio: 19 (ref 12–28)
BUN: 16 mg/dL (ref 8–27)
Bilirubin Total: 0.3 mg/dL (ref 0.0–1.2)
CO2: 25 mmol/L (ref 20–29)
Calcium: 10.1 mg/dL (ref 8.7–10.3)
Chloride: 102 mmol/L (ref 96–106)
Creatinine, Ser: 0.84 mg/dL (ref 0.57–1.00)
GFR calc Af Amer: 86 mL/min/{1.73_m2} (ref >59)
GFR calc non Af Amer: 74 mL/min/{1.73_m2} (ref >59)
Globulin, Total: 3.3 g/dL (ref 1.5–4.5)
Glucose: 94 mg/dL (ref 65–99)
Potassium: 4.7 mmol/L (ref 3.5–5.2)
Sodium: 140 mmol/L (ref 134–144)
Total Protein: 8.1 g/dL (ref 6.0–8.5)

## 2020-12-26 LAB — LIPID PANEL WITH LDL/HDL RATIO
Cholesterol, Total: 163 mg/dL (ref 100–199)
HDL: 49 mg/dL (ref >39)
LDL Chol Calc (NIH): 101 mg/dL — ABNORMAL HIGH (ref 0–99)
LDL/HDL Ratio: 2.1 ratio (ref 0.0–3.2)
Triglycerides: 68 mg/dL (ref 0–149)
VLDL Cholesterol Cal: 13 mg/dL (ref 5–40)

## 2020-12-26 LAB — HEMOGLOBIN A1C
Est. average glucose Bld gHb Est-mCnc: 105 mg/dL
Hgb A1c MFr Bld: 5.3 % (ref 4.8–5.6)

## 2020-12-26 LAB — INSULIN, RANDOM: INSULIN: 11.2 u[IU]/mL (ref 2.6–24.9)

## 2020-12-26 NOTE — Progress Notes (Signed)
Chief Complaint:   OBESITY Alicia Knapp is here to discuss her progress with her obesity treatment plan along with follow-up of her obesity related diagnoses. Alicia Knapp is on the Category 2 Plan and states she is following her eating plan approximately 75-80% of the time. Alicia Knapp states she is doing cardio and weights 30-60 minutes 3-4 times per week.  Today's visit was #: 57 Starting weight: 212 lbs Starting date: 08/03/2018 Today's weight: 181 lbs Today's date: 12/25/2020 Total lbs lost to date: 31 lbs Total lbs lost since last in-office visit: 4 lbs  Interim History: Alicia Knapp treated herself for her birthday and Valentine's Day. Otherwise, she has really stuck to the meal plan. She and her husband ate a seafood dinner for her birthday.  Subjective:   1. Pre-diabetes Alicia Knapp is on Metformin without GI side effects. Her last labs in October 2021 showed A1c 5.3 and insulin level 20.6.  2. Essential hypertension Alicia Knapp's BP is well controlled today.  Pt denies chest pain, chest pressure and headache. She is on amlodipine and Cozaar.  BP Readings from Last 3 Encounters:  12/25/20 139/72  12/04/20 129/69  11/13/20 134/71    3. Mixed hyperlipidemia Alicia Knapp's last labs showed LDL 84, HDL 49, and triglycerides 86. She denies transaminitis or myalgias. She is on statin therapy.  4. At risk for diabetes mellitus Alicia Knapp is at higher than average risk for developing diabetes due to obesity.   Assessment/Plan:   1. Pre-diabetes Alicia Knapp will continue to work on weight loss, exercise, and decreasing simple carbohydrates to help decrease the risk of diabetes. Check labs today.  - metFORMIN (GLUCOPHAGE) 500 MG tablet; Take 1 tablet (500 mg total) by mouth 2 (two) times daily with a meal.  Dispense: 180 tablet; Refill: 0  - Hemoglobin A1c - Insulin, random  2. Essential hypertension Alicia Knapp is working on healthy weight loss and exercise to improve blood pressure control. We will watch for signs of hypotension as she  continues her lifestyle modifications. Check labs today.  - Comprehensive metabolic panel  3. Mixed hyperlipidemia Cardiovascular risk and specific lipid/LDL goals reviewed.  We discussed several lifestyle modifications today and Alicia Knapp will continue to work on diet, exercise and weight loss efforts. Orders and follow up as documented in patient record. Check labs today.  Counseling Intensive lifestyle modifications are the first line treatment for this issue. . Dietary changes: Increase soluble fiber. Decrease simple carbohydrates. . Exercise changes: Moderate to vigorous-intensity aerobic activity 150 minutes per week if tolerated. . Lipid-lowering medications: see documented in medical record.  - Lipid Panel With LDL/HDL Ratio  4. At risk for diabetes mellitus Alicia Knapp was given approximately 15 minutes of diabetes education and counseling today. We discussed intensive lifestyle modifications today with an emphasis on weight loss as well as increasing exercise and decreasing simple carbohydrates in her diet. We also reviewed medication options with an emphasis on risk versus benefit of those discussed.   Repetitive spaced learning was employed today to elicit superior memory formation and behavioral change.  5. Class 1 obesity due to excess calories with serious comorbidity and body mass index (BMI) of 30.0 to 30.9 in adult Alicia Knapp is currently in the action stage of change. As such, her goal is to continue with weight loss efforts. She has agreed to the Category 2 Plan.   Exercise goals: As is  Behavioral modification strategies: increasing lean protein intake, meal planning and cooking strategies, keeping healthy foods in the home and planning for success.  Alicia Knapp  has agreed to follow-up with our clinic in 3 weeks. She was informed of the importance of frequent follow-up visits to maximize her success with intensive lifestyle modifications for her multiple health conditions.   Alicia Knapp was  informed we would discuss her lab results at her next visit unless there is a critical issue that needs to be addressed sooner. Alicia Knapp agreed to keep her next visit at the agreed upon time to discuss these results.  Objective:   Blood pressure 139/72, pulse 77, temperature 97.6 F (36.4 C), temperature source Oral, height 5\' 6"  (1.676 m), weight 181 lb (82.1 kg), SpO2 99 %. Body mass index is 29.21 kg/m.  General: Cooperative, alert, well developed, in no acute distress. HEENT: Conjunctivae and lids unremarkable. Cardiovascular: Regular rhythm.  Lungs: Normal work of breathing. Neurologic: No focal deficits.   Lab Results  Component Value Date   CREATININE 0.84 12/25/2020   BUN 16 12/25/2020   NA 140 12/25/2020   K 4.7 12/25/2020   CL 102 12/25/2020   CO2 25 12/25/2020   Lab Results  Component Value Date   ALT 18 12/25/2020   AST 17 12/25/2020   ALKPHOS 75 12/25/2020   BILITOT 0.3 12/25/2020   Lab Results  Component Value Date   HGBA1C 5.3 12/25/2020   HGBA1C 5.3 08/08/2020   HGBA1C 5.1 08/16/2019   HGBA1C 5.4 04/08/2019   HGBA1C 5.5 11/09/2018   Lab Results  Component Value Date   INSULIN 11.2 12/25/2020   INSULIN 20.6 08/08/2020   INSULIN 10.4 08/16/2019   INSULIN 20.3 04/08/2019   INSULIN 13.8 11/09/2018   Lab Results  Component Value Date   TSH 2.530 04/08/2019   Lab Results  Component Value Date   CHOL 163 12/25/2020   HDL 49 12/25/2020   LDLCALC 101 (H) 12/25/2020   LDLDIRECT 140.5 08/12/2013   TRIG 68 12/25/2020   CHOLHDL 4 03/23/2020   Lab Results  Component Value Date   WBC 3.6 (L) 09/15/2017   HGB 12.7 09/15/2017   HCT 39.3 09/15/2017   MCV 88.7 09/15/2017   PLT 349.0 09/15/2017    Attestation Statements:   Reviewed by clinician on day of visit: allergies, medications, problem list, medical history, surgical history, family history, social history, and previous encounter notes.  Coral Ceo, am acting as transcriptionist for  Coralie Common, MD.   I have reviewed the above documentation for accuracy and completeness, and I agree with the above. - Jinny Blossom, MD

## 2021-01-07 ENCOUNTER — Encounter

## 2021-01-07 ENCOUNTER — Ambulatory Visit
Admission: RE | Admit: 2021-01-07 | Discharge: 2021-01-07 | Disposition: A | Discharge status: Home / Self Care | Source: Ambulatory Visit | Attending: Family Medicine | Admitting: Family Medicine

## 2021-01-07 ENCOUNTER — Other Ambulatory Visit: Payer: Self-pay

## 2021-01-07 ENCOUNTER — Other Ambulatory Visit

## 2021-01-07 DIAGNOSIS — Z1231 Encounter for screening mammogram for malignant neoplasm of breast: Secondary | ICD-10-CM

## 2021-01-09 ENCOUNTER — Other Ambulatory Visit: Payer: Self-pay

## 2021-01-15 ENCOUNTER — Other Ambulatory Visit: Payer: Self-pay

## 2021-01-15 ENCOUNTER — Ambulatory Visit (INDEPENDENT_AMBULATORY_CARE_PROVIDER_SITE_OTHER): Admitting: Family Medicine

## 2021-01-15 ENCOUNTER — Encounter (INDEPENDENT_AMBULATORY_CARE_PROVIDER_SITE_OTHER): Payer: Self-pay | Admitting: Family Medicine

## 2021-01-15 VITALS — BP 111/70 | HR 70 | Temp 98.1°F | Ht 66.0 in | Wt 181.0 lb

## 2021-01-15 DIAGNOSIS — E559 Vitamin D deficiency, unspecified: Secondary | ICD-10-CM | POA: Diagnosis not present

## 2021-01-15 DIAGNOSIS — Z6830 Body mass index (BMI) 30.0-30.9, adult: Secondary | ICD-10-CM

## 2021-01-15 DIAGNOSIS — E669 Obesity, unspecified: Secondary | ICD-10-CM

## 2021-01-15 DIAGNOSIS — R7303 Prediabetes: Secondary | ICD-10-CM

## 2021-01-17 NOTE — Progress Notes (Signed)
Chief Complaint:   OBESITY Alicia Knapp is here to discuss her progress with her obesity treatment plan along with follow-up of her obesity related diagnoses. Alicia Knapp is on the Category 2 Plan and states she is following her eating plan approximately 80% of the time. Alicia Knapp states she is doing cardio and weight training 30-60 minutes 5 times per week.  Today's visit was #: 2 Starting weight: 212 lbs Starting date: 08/03/2018 Today's weight: 181 lbs Today's date: 01/15/2021 Total lbs lost to date: 31 lbs Total lbs lost since last in-office visit: 0  Interim History: Alicia Knapp has been trying to stay disciplined on the plan. She voices that she has put some of her plants outside and is working on getting her garden together. She is occasionally eating more than she should in terms of carbohydrates. The next few weeks, pt doesn't have much planned. She is feeling like she is getting more out of weight loss.   Subjective:   1. Pre-diabetes Alicia Knapp's last A1c was 5.3 and insulin level 11.2. She is on Metformin.  Lab Results  Component Value Date   HGBA1C 5.3 12/25/2020   Lab Results  Component Value Date   INSULIN 11.2 12/25/2020   INSULIN 20.6 08/08/2020   INSULIN 10.4 08/16/2019   INSULIN 20.3 04/08/2019   INSULIN 13.8 11/09/2018    2. Vitamin D deficiency Alicia Knapp's last Vit D level was 66. She is not on a supplement.  Assessment/Plan:   1. Pre-diabetes Alicia Knapp will continue to work on weight loss, exercise, and decreasing simple carbohydrates to help decrease the risk of diabetes. Continue Metformin. No refill needed at this time.  2. Vitamin D deficiency Low Vitamin D level contributes to fatigue and are associated with obesity, breast, and colon cancer. She agrees to follow-up for routine testing of Vitamin D, at least 2-3 times per year to avoid over-replacement. Check Vit D level in 3 months.  3. Class 1 obesity with serious comorbidity and body mass index (BMI) of 30.0 to 30.9 in adult,  unspecified obesity type Alicia Knapp is currently in the action stage of change. As such, her goal is to continue with weight loss efforts. She has agreed to the Category 2 Plan.   Exercise goals: As is- Pt is to continue with current exercise regimen.  Behavioral modification strategies: increasing lean protein intake, meal planning and cooking strategies, keeping healthy foods in the home and planning for success.  Alicia Knapp has agreed to follow-up with our clinic in 3 weeks. She was informed of the importance of frequent follow-up visits to maximize her success with intensive lifestyle modifications for her multiple health conditions.   Objective:   Blood pressure 111/70, pulse 70, temperature 98.1 F (36.7 C), temperature source Oral, height 5\' 6"  (1.676 m), weight 181 lb (82.1 kg), SpO2 100 %. Body mass index is 29.21 kg/m.  General: Cooperative, alert, well developed, in no acute distress. HEENT: Conjunctivae and lids unremarkable. Cardiovascular: Regular rhythm.  Lungs: Normal work of breathing. Neurologic: No focal deficits.   Lab Results  Component Value Date   CREATININE 0.84 12/25/2020   BUN 16 12/25/2020   NA 140 12/25/2020   K 4.7 12/25/2020   CL 102 12/25/2020   CO2 25 12/25/2020   Lab Results  Component Value Date   ALT 18 12/25/2020   AST 17 12/25/2020   ALKPHOS 75 12/25/2020   BILITOT 0.3 12/25/2020   Lab Results  Component Value Date   HGBA1C 5.3 12/25/2020   HGBA1C 5.3  08/08/2020   HGBA1C 5.1 08/16/2019   HGBA1C 5.4 04/08/2019   HGBA1C 5.5 11/09/2018   Lab Results  Component Value Date   INSULIN 11.2 12/25/2020   INSULIN 20.6 08/08/2020   INSULIN 10.4 08/16/2019   INSULIN 20.3 04/08/2019   INSULIN 13.8 11/09/2018   Lab Results  Component Value Date   TSH 2.530 04/08/2019   Lab Results  Component Value Date   CHOL 163 12/25/2020   HDL 49 12/25/2020   LDLCALC 101 (H) 12/25/2020   LDLDIRECT 140.5 08/12/2013   TRIG 68 12/25/2020   CHOLHDL 4  03/23/2020   Lab Results  Component Value Date   WBC 3.6 (L) 09/15/2017   HGB 12.7 09/15/2017   HCT 39.3 09/15/2017   MCV 88.7 09/15/2017   PLT 349.0 09/15/2017    Attestation Statements:   Reviewed by clinician on day of visit: allergies, medications, problem list, medical history, surgical history, family history, social history, and previous encounter notes.  Coral Ceo, am acting as transcriptionist for Coralie Common, MD.   I have reviewed the above documentation for accuracy and completeness, and I agree with the above. - Jinny Blossom, MD

## 2021-02-05 ENCOUNTER — Encounter (INDEPENDENT_AMBULATORY_CARE_PROVIDER_SITE_OTHER): Payer: Self-pay | Admitting: Family Medicine

## 2021-02-05 ENCOUNTER — Ambulatory Visit (INDEPENDENT_AMBULATORY_CARE_PROVIDER_SITE_OTHER): Admitting: Family Medicine

## 2021-02-05 ENCOUNTER — Other Ambulatory Visit: Payer: Self-pay

## 2021-02-05 VITALS — BP 149/75 | HR 63 | Temp 97.8°F | Ht 66.0 in | Wt 183.0 lb

## 2021-02-05 DIAGNOSIS — Z6830 Body mass index (BMI) 30.0-30.9, adult: Secondary | ICD-10-CM

## 2021-02-05 DIAGNOSIS — I10 Essential (primary) hypertension: Secondary | ICD-10-CM | POA: Diagnosis not present

## 2021-02-05 DIAGNOSIS — E669 Obesity, unspecified: Secondary | ICD-10-CM | POA: Diagnosis not present

## 2021-02-05 DIAGNOSIS — R7303 Prediabetes: Secondary | ICD-10-CM | POA: Diagnosis not present

## 2021-02-10 NOTE — Progress Notes (Signed)
Chief Complaint:   OBESITY Alicia Knapp is here to discuss her progress with her obesity treatment plan along with follow-up of her obesity related diagnoses. Alicia Knapp is on the Category 2 Plan and states she is following her eating plan approximately 80% of the time. Alicia Knapp states she is weight training and cardio 30-60 minutes 3 times per week.  Today's visit was #: 63 Starting weight: 212 lbs Starting date: 08/03/2018 Today's weight: 183 lbs Today's date: 02/05/2021 Total lbs lost to date: 29 lbs Total lbs lost since last in-office visit: 0  Interim History: Alicia Knapp recognizes that she has eaten off plan a bit and can recognize that may have sabotaged her. She has no plans for the next few weeks, except gardening. She has no plans for travel or celebrations. She is thinking about going to [place], [place] over the summer.  Subjective:   1. Essential hypertension  Alicia Knapp BP is elevated today.  Pt denies chest pain, chest pressure and headache. She is on amlodipine, Coreg, and Cozaar.  2. Pre-diabetes Alicia Knapp is on Metformin and denies GI side effects.  Assessment/Plan:   1. Essential hypertension Alicia Knapp is working on healthy weight loss and exercise to improve blood pressure control. We will watch for signs of hypotension as she continues her lifestyle modifications. Continue current treatment plan and f/u BP at next appointment.  2. Pre-diabetes Alicia Knapp will continue to work on weight loss, exercise, and decreasing simple carbohydrates to help decrease the risk of diabetes. Continue Metformin. No refill needed today.  3. Class 1 obesity with serious comorbidity and body mass index (BMI) of 30.0 to 30.9 in adult, unspecified obesity type Alicia Knapp is currently in the action stage of change. As such, her goal is to continue with weight loss efforts. She has agreed to the Category 2 Plan.   Exercise goals: As is  Behavioral modification strategies: increasing lean protein intake, meal planning and  cooking strategies, keeping healthy foods in the home and planning for success.  Alicia Knapp has agreed to follow-up with our clinic in 3 weeks. She was informed of the importance of frequent follow-up visits to maximize her success with intensive lifestyle modifications for her multiple health conditions.   Objective:   Blood pressure (!) 149/75, pulse 63, temperature 97.8 F (36.6 C), weight 183 lb (83 kg), SpO2 100 %. Body mass index is 29.54 kg/m.  General: Cooperative, alert, well developed, in no acute distress. HEENT: Conjunctivae and lids unremarkable. Cardiovascular: Regular rhythm.  Lungs: Normal work of breathing. Neurologic: No focal deficits.   Lab Results  Component Value Date   CREATININE 0.84 12/25/2020   BUN 16 12/25/2020   NA 140 12/25/2020   K 4.7 12/25/2020   CL 102 12/25/2020   CO2 25 12/25/2020   Lab Results  Component Value Date   ALT 18 12/25/2020   AST 17 12/25/2020   ALKPHOS 75 12/25/2020   BILITOT 0.3 12/25/2020   Lab Results  Component Value Date   HGBA1C 5.3 12/25/2020   HGBA1C 5.3 08/08/2020   HGBA1C 5.1 08/16/2019   HGBA1C 5.4 04/08/2019   HGBA1C 5.5 11/09/2018   Lab Results  Component Value Date   INSULIN 11.2 12/25/2020   INSULIN 20.6 08/08/2020   INSULIN 10.4 08/16/2019   INSULIN 20.3 04/08/2019   INSULIN 13.8 11/09/2018   Lab Results  Component Value Date   TSH 2.530 04/08/2019   Lab Results  Component Value Date   CHOL 163 12/25/2020   HDL 49 12/25/2020  LDLCALC 101 (H) 12/25/2020   LDLDIRECT 140.5 08/12/2013   TRIG 68 12/25/2020   CHOLHDL 4 03/23/2020   Lab Results  Component Value Date   WBC 3.6 (L) 09/15/2017   HGB 12.7 09/15/2017   HCT 39.3 09/15/2017   MCV 88.7 09/15/2017   PLT 349.0 09/15/2017    Attestation Statements:   Reviewed by clinician on day of visit: allergies, medications, problem list, medical history, surgical history, family history, social history, and previous encounter notes.  Time spent on  visit including pre-visit chart review and post-visit care and charting was 15 minutes.   Coral Ceo, am acting as transcriptionist for Coralie Common, MD.   I have reviewed the above documentation for accuracy and completeness, and I agree with the above. - Jinny Blossom, MD

## 2021-02-27 ENCOUNTER — Ambulatory Visit (INDEPENDENT_AMBULATORY_CARE_PROVIDER_SITE_OTHER): Admitting: Family Medicine

## 2021-02-27 ENCOUNTER — Other Ambulatory Visit: Payer: Self-pay

## 2021-02-27 ENCOUNTER — Encounter (INDEPENDENT_AMBULATORY_CARE_PROVIDER_SITE_OTHER): Payer: Self-pay | Admitting: Family Medicine

## 2021-02-27 VITALS — BP 136/76 | HR 62 | Temp 98.2°F | Ht 66.0 in | Wt 183.0 lb

## 2021-02-27 DIAGNOSIS — Z6830 Body mass index (BMI) 30.0-30.9, adult: Secondary | ICD-10-CM

## 2021-02-27 DIAGNOSIS — E6609 Other obesity due to excess calories: Secondary | ICD-10-CM

## 2021-02-27 DIAGNOSIS — R7303 Prediabetes: Secondary | ICD-10-CM | POA: Diagnosis not present

## 2021-02-27 DIAGNOSIS — I10 Essential (primary) hypertension: Secondary | ICD-10-CM | POA: Diagnosis not present

## 2021-02-27 DIAGNOSIS — Z9189 Other specified personal risk factors, not elsewhere classified: Secondary | ICD-10-CM

## 2021-02-27 MED ORDER — METFORMIN HCL 500 MG PO TABS: 500.0000 mg | ORAL_TABLET | Freq: Two times a day (BID) | ORAL | 0 refills | Status: DC

## 2021-02-28 NOTE — Progress Notes (Signed)
Chief Complaint:   OBESITY Alicia Knapp is here to discuss her progress with her obesity treatment plan along with follow-up of her obesity related diagnoses. Alicia Knapp is on the Category 2 Plan and states she is following her eating plan approximately 70% of the time. Alicia Knapp states she is weight training and cardio 30-60 minutes 2-5 times per week.  Today's visit was #: 32 Starting weight: 212 lbs Starting date: 08/03/2018 Today's weight: 183 lbs Today's date: 02/27/2021 Total lbs lost to date: 29  Total lbs lost since last in-office visit: 0  Interim History: Pt started her garden, ate out a few times and indulged over Easter dinner. She has no plans over the next few weeks so patient wants to make a significant commitment back to meal plan. She doesn't foresee eating out frequently in the next few weeks. She wants to know how to incorporate [place] oatmeal cups into her meal plan at breakfast.  Subjective:   1. Pre-diabetes Alicia Knapp is on Metformin with no GI side effects. Her last A1c was 5.3.  2. Essential hypertension BP well controlled today. Pt denies chest pain, chest pressure and headache.  3. At risk for diabetes mellitus Alicia Knapp is at higher than average risk for developing diabetes due to obesity.   Assessment/Plan:   1. Pre-diabetes Alicia Knapp will continue to work on weight loss, exercise, and decreasing simple carbohydrates to help decrease the risk of diabetes.   - metFORMIN (GLUCOPHAGE) 500 MG tablet; Take 1 tablet (500 mg total) by mouth 2 (two) times daily with a meal.  Dispense: 180 tablet; Refill: 0  2. Essential hypertension Alicia Knapp is working on healthy weight loss and exercise to improve blood pressure control. We will watch for signs of hypotension as she continues her lifestyle modifications. Continue current treatment plan.  3. At risk for diabetes mellitus Alicia Knapp was given approximately 15 minutes of diabetes education and counseling today. We discussed intensive lifestyle  modifications today with an emphasis on weight loss as well as increasing exercise and decreasing simple carbohydrates in her diet. We also reviewed medication options with an emphasis on risk versus benefit of those discussed.   Repetitive spaced learning was employed today to elicit superior memory formation and behavioral change.  4. Class 1 obesity due to excess calories with serious comorbidity and body mass index (BMI) of 30.0 to 30.9 in adult Alicia Knapp is currently in the action stage of change. As such, her goal is to continue with weight loss efforts. She has agreed to the Category 2 Plan.   Exercise goals: As is  Behavioral modification strategies: increasing lean protein intake, meal planning and cooking strategies, keeping healthy foods in the home and planning for success.  Alicia Knapp has agreed to follow-up with our clinic in 3-4 weeks. She was informed of the importance of frequent follow-up visits to maximize her success with intensive lifestyle modifications for her multiple health conditions.   Objective:   Blood pressure 136/76, pulse 62, temperature 98.2 F (36.8 C), height 5\' 6"  (1.676 m), weight 183 lb (83 kg), SpO2 98 %. Body mass index is 29.54 kg/m.  General: Cooperative, alert, well developed, in no acute distress. HEENT: Conjunctivae and lids unremarkable. Cardiovascular: Regular rhythm.  Lungs: Normal work of breathing. Neurologic: No focal deficits.   Lab Results  Component Value Date   CREATININE 0.84 12/25/2020   BUN 16 12/25/2020   NA 140 12/25/2020   K 4.7 12/25/2020   CL 102 12/25/2020   CO2 25 12/25/2020  Lab Results  Component Value Date   ALT 18 12/25/2020   AST 17 12/25/2020   ALKPHOS 75 12/25/2020   BILITOT 0.3 12/25/2020   Lab Results  Component Value Date   HGBA1C 5.3 12/25/2020   HGBA1C 5.3 08/08/2020   HGBA1C 5.1 08/16/2019   HGBA1C 5.4 04/08/2019   HGBA1C 5.5 11/09/2018   Lab Results  Component Value Date   INSULIN 11.2 12/25/2020    INSULIN 20.6 08/08/2020   INSULIN 10.4 08/16/2019   INSULIN 20.3 04/08/2019   INSULIN 13.8 11/09/2018   Lab Results  Component Value Date   TSH 2.530 04/08/2019   Lab Results  Component Value Date   CHOL 163 12/25/2020   HDL 49 12/25/2020   LDLCALC 101 (H) 12/25/2020   LDLDIRECT 140.5 08/12/2013   TRIG 68 12/25/2020   CHOLHDL 4 03/23/2020   Lab Results  Component Value Date   WBC 3.6 (L) 09/15/2017   HGB 12.7 09/15/2017   HCT 39.3 09/15/2017   MCV 88.7 09/15/2017   PLT 349.0 09/15/2017     Attestation Statements:   Reviewed by clinician on day of visit: allergies, medications, problem list, medical history, surgical history, family history, social history, and previous encounter notes.  Coral Ceo, am acting as transcriptionist for Coralie Common, MD.  I have reviewed the above documentation for accuracy and completeness, and I agree with the above. - Jinny Blossom, MD

## 2021-03-21 ENCOUNTER — Other Ambulatory Visit: Payer: Self-pay

## 2021-03-21 ENCOUNTER — Encounter (INDEPENDENT_AMBULATORY_CARE_PROVIDER_SITE_OTHER): Payer: Self-pay | Admitting: Family Medicine

## 2021-03-21 ENCOUNTER — Ambulatory Visit (INDEPENDENT_AMBULATORY_CARE_PROVIDER_SITE_OTHER): Admitting: Family Medicine

## 2021-03-21 VITALS — BP 148/78 | HR 61 | Temp 98.6°F | Ht 66.0 in | Wt 183.0 lb

## 2021-03-21 DIAGNOSIS — E782 Mixed hyperlipidemia: Secondary | ICD-10-CM | POA: Diagnosis not present

## 2021-03-21 DIAGNOSIS — R632 Polyphagia: Secondary | ICD-10-CM

## 2021-03-21 DIAGNOSIS — E6609 Other obesity due to excess calories: Secondary | ICD-10-CM

## 2021-03-21 DIAGNOSIS — Z6830 Body mass index (BMI) 30.0-30.9, adult: Secondary | ICD-10-CM

## 2021-03-25 NOTE — Progress Notes (Signed)
Chief Complaint:   OBESITY Alicia Knapp is here to discuss her progress with her obesity treatment plan along with follow-up of her obesity related diagnoses. Alicia Knapp is on the Category 2 Plan and states she is following her eating plan approximately 75% of the time. Alicia Knapp states she is doing 3 days of strength training and 2 days of cardio 30-60 minutes 5 times per week.  Today's visit was #: 69 Starting weight: 212 lbs Starting date: 08/03/2018 Today's weight: 183 lbs Today's date: 03/21/2021 Total lbs lost to date: 29 lbs Total lbs lost since last in-office visit: 0  Interim History: Alicia Knapp is eating a bunch of fresh produce from her garden. She mentions that she has eaten slightly indulgently. She didn't exercise the way she wanted to due doing significant gardening. She ate out a few nights- pizza one night, indulgent lunch, indulgent dinner sandwich option.  Subjective:   1. Mixed hyperlipidemia Alicia Knapp is on Lipitor. Her last LDL 101. She denies myalgias or transaminitis.  Alicia Knapp didn't like Topamax but when stopping Topamax, sweet indulgences stopped.  Assessment/Plan:   1. Mixed hyperlipidemia Cardiovascular risk and specific lipid/LDL goals reviewed.  We discussed several lifestyle modifications today and Alicia Knapp will continue to work on diet, exercise and weight loss efforts. Orders and follow up as documented in patient record.  -Repeat labs with Dr. Etter Sjogren.  Counseling Intensive lifestyle modifications are the first line treatment for this issue. . Dietary changes: Increase soluble fiber. Decrease simple carbohydrates. . Exercise changes: Moderate to vigorous-intensity aerobic activity 150 minutes per week if tolerated. . Lipid-lowering medications: see documented in medical record.  2. Polyphagia Intensive lifestyle modifications are the first line treatment for this issue. We discussed several lifestyle modifications today and she will continue to work on diet,  exercise and weight loss efforts. Orders and follow up as documented in patient record. -Discontinue Topamax.  Counseling . Polyphagia is excessive hunger. . Causes can include: low blood sugars, hypERthyroidism, PMS, lack of sleep, stress, insulin resistance, diabetes, certain medications, and diets that are deficient in protein and fiber.   3. Class 1 obesity due to excess calories with serious comorbidity and body mass index (BMI) of 30.0 to 30.9 in adult Alicia Knapp is currently in the action stage of change. As such, her goal is to continue with weight loss efforts. She has agreed to the Category 2 Plan- pt is to be more mindful of portions at lunch.   Exercise goals: As is  Behavioral modification strategies: increasing lean protein intake, meal planning and cooking strategies, keeping healthy foods in the home and planning for success.  Alicia Knapp has agreed to follow-up with our clinic in 3-4 weeks. She was informed of the importance of frequent follow-up visits to maximize her success with intensive lifestyle modifications for her multiple health conditions.   Objective:   Blood pressure (!) 148/78, pulse 61, temperature 98.6 F (37 C), height 5\' 6"  (1.676 m), weight 183 lb (83 kg), SpO2 100 %. Body mass index is 29.54 kg/m.  General: Cooperative, alert, well developed, in no acute distress. HEENT: Conjunctivae and lids unremarkable. Cardiovascular: Regular rhythm.  Lungs: Normal work of breathing. Neurologic: No focal deficits.   Lab Results  Component Value Date   CREATININE 0.84 12/25/2020   BUN 16 12/25/2020   NA 140 12/25/2020   K 4.7 12/25/2020   CL 102 12/25/2020   CO2 25 12/25/2020   Lab Results  Component Value Date   ALT 18 12/25/2020  AST 17 12/25/2020   ALKPHOS 75 12/25/2020   BILITOT 0.3 12/25/2020   Lab Results  Component Value Date   HGBA1C 5.3 12/25/2020   HGBA1C 5.3 08/08/2020   HGBA1C 5.1 08/16/2019   HGBA1C 5.4 04/08/2019   HGBA1C 5.5 11/09/2018    Lab Results  Component Value Date   INSULIN 11.2 12/25/2020   INSULIN 20.6 08/08/2020   INSULIN 10.4 08/16/2019   INSULIN 20.3 04/08/2019   INSULIN 13.8 11/09/2018   Lab Results  Component Value Date   TSH 2.530 04/08/2019   Lab Results  Component Value Date   CHOL 163 12/25/2020   HDL 49 12/25/2020   LDLCALC 101 (H) 12/25/2020   LDLDIRECT 140.5 08/12/2013   TRIG 68 12/25/2020   CHOLHDL 4 03/23/2020   Lab Results  Component Value Date   WBC 3.6 (L) 09/15/2017   HGB 12.7 09/15/2017   HCT 39.3 09/15/2017   MCV 88.7 09/15/2017   PLT 349.0 09/15/2017   No results found for: IRON, TIBC, FERRITIN  Attestation Statements:   Reviewed by clinician on day of visit: allergies, medications, problem list, medical history, surgical history, family history, social history, and previous encounter notes.  Coral Ceo, CMA, am acting as transcriptionist for Coralie Common, MD.   I have reviewed the above documentation for accuracy and completeness, and I agree with the above. - Jinny Blossom, MD

## 2021-03-26 ENCOUNTER — Encounter: Payer: Self-pay | Admitting: Family Medicine

## 2021-03-26 ENCOUNTER — Ambulatory Visit: Attending: Internal Medicine

## 2021-03-26 ENCOUNTER — Ambulatory Visit: Admitting: Family Medicine

## 2021-03-26 ENCOUNTER — Other Ambulatory Visit: Payer: Self-pay

## 2021-03-26 VITALS — BP 126/70 | HR 59 | Temp 98.3°F | Resp 19 | Ht 66.0 in | Wt 189.0 lb

## 2021-03-26 DIAGNOSIS — I10 Essential (primary) hypertension: Secondary | ICD-10-CM | POA: Diagnosis not present

## 2021-03-26 DIAGNOSIS — L237 Allergic contact dermatitis due to plants, except food: Secondary | ICD-10-CM | POA: Diagnosis not present

## 2021-03-26 DIAGNOSIS — E785 Hyperlipidemia, unspecified: Secondary | ICD-10-CM | POA: Diagnosis not present

## 2021-03-26 DIAGNOSIS — J302 Other seasonal allergic rhinitis: Secondary | ICD-10-CM | POA: Diagnosis not present

## 2021-03-26 DIAGNOSIS — E669 Obesity, unspecified: Secondary | ICD-10-CM

## 2021-03-26 DIAGNOSIS — Z6830 Body mass index (BMI) 30.0-30.9, adult: Secondary | ICD-10-CM

## 2021-03-26 DIAGNOSIS — Z23 Encounter for immunization: Secondary | ICD-10-CM

## 2021-03-26 MED ORDER — LEVOCETIRIZINE DIHYDROCHLORIDE 5 MG PO TABS: 5.0000 mg | ORAL_TABLET | Freq: Every evening | ORAL | 3 refills | Status: DC

## 2021-03-26 MED ORDER — AMLODIPINE BESYLATE 10 MG PO TABS: 10.0000 mg | ORAL_TABLET | Freq: Every day | ORAL | 3 refills | Status: DC

## 2021-03-26 MED ORDER — CARVEDILOL 6.25 MG PO TABS: 6.2500 mg | ORAL_TABLET | Freq: Two times a day (BID) | ORAL | 3 refills | Status: DC

## 2021-03-26 MED ORDER — HYDROCHLOROTHIAZIDE 25 MG PO TABS: 25.0000 mg | ORAL_TABLET | Freq: Every day | ORAL | 3 refills | Status: DC

## 2021-03-26 MED ORDER — LOSARTAN POTASSIUM 50 MG PO TABS: 50.0000 mg | ORAL_TABLET | Freq: Every day | ORAL | 3 refills | Status: DC

## 2021-03-26 MED ORDER — ATORVASTATIN CALCIUM 10 MG PO TABS: 10.0000 mg | ORAL_TABLET | Freq: Every day | ORAL | 3 refills | Status: DC

## 2021-03-26 MED ORDER — TRIAMCINOLONE ACETONIDE 0.1 % EX CREA: 1.0000 "application " | TOPICAL_CREAM | Freq: Two times a day (BID) | CUTANEOUS | 0 refills | Status: DC

## 2021-03-26 NOTE — Assessment & Plan Note (Signed)
Well controlled, no changes to meds. Encouraged heart healthy diet such as the DASH diet and exercise as tolerated.  °

## 2021-03-26 NOTE — Progress Notes (Signed)
Subjective:   By signing my name below, I, [name], attest that this documentation has been prepared under the direction and in the presence of Dr. Roma Schanz, DO. 03/26/2021     Patient ID: Alicia Knapp, female    DOB: [birth year], 65 y.o.   MRN: [id number]  Chief Complaint  Patient presents with  . Follow-up    6 months: Concerns/ questions: Pt would like a cream for her dry skin on her left leg. Pt also asks for refills on all medications.  -tlc,rma    HPI Patient is in today for a office visit. She complains of a dry patch on her left buttock. She is taking OTC unscented lotion to manage her symptoms and it is agitated by perfume. She does not scratch it. She is requesting a refill on all her medication except Flonase at this time. She denies having any fever, ear pain, congestion, sinus pain, sore throat, eye pain, chest pain, palpations, cough, SOB, wheezing, n/v/d, constipation, blood in stool, dysuria, frequency, hematuria, dizziness, or headaches at this time. She reports getting her 4th Covid-19  vaccine today. She has an upcoming appointment in August for a bone density scan. She had completed her mammogram this year. She is not interested in getting a HIV screening. She is due for a colonoscopy this year.   Past Medical History:  Diagnosis Date  . Anxiety   . B12 deficiency   . Constipation   . Heart murmur   . Hyperlipidemia   . Hypertension   . Joint pain   . Postoperative nausea   . Prediabetes   . Swelling    lower ext  . Vitamin D deficiency     Past Surgical History:  Procedure Laterality Date  . ABDOMINAL HYSTERECTOMY    . APPENDECTOMY    . COLONOSCOPY  05/22/2015   Dr.Pyrtle  . POLYPECTOMY      Family History  Problem Relation Age of Onset  . Cancer Father        prostate  . Prostate cancer Father   . Diabetes Mother   . Thyroid disease Mother   . Hypertension Mother   . Hyperlipidemia Mother   . Kidney disease Mother   .  Cancer Mother   . Sleep apnea Mother   . Obesity Mother   . Hypertension Sister   . Hypertension Brother   . Stroke Brother   . Colon cancer Brother 41       died 11 - 4-5 months after dx  . Arthritis Maternal Grandmother   . Esophageal cancer Neg Hx   . Rectal cancer Neg Hx   . Stomach cancer Neg Hx     Social History   Socioeconomic History  . Marital status: Married    Spouse name: [name]  . Number of children: Not on file  . Years of education: Not on file  . Highest education level: Not on file  Occupational History  . Occupation: Stay at home spouse  Tobacco Use  . Smoking status: Former Smoker    Types: Cigarettes    Quit date: 09/17/2014    Years since quitting: 6.5  . Smokeless tobacco: Never Used  Vaping Use  . Vaping Use: Never used  Substance and Sexual Activity  . Alcohol use: No    Alcohol/week: 0.0 standard drinks  . Drug use: No  . Sexual activity: Yes    Partners: Male  Other Topics Concern  . Not on file  Social History Narrative  .  Not on file   Social Determinants of Health   Financial Resource Strain: Not on file  Food Insecurity: Not on file  Transportation Needs: Not on file  Physical Activity: Not on file  Stress: Not on file  Social Connections: Not on file  Intimate Partner Violence: Not on file    Outpatient Medications Prior to Visit  Medication Sig Dispense Refill  . ARIPiprazole (ABILIFY) 2 MG tablet Take 2 mg by mouth daily.     . ARIPiprazole (ABILIFY) 5 MG tablet Take 1 tablet by mouth daily.  0  . COVID-19 mRNA vaccine, Pfizer, 30 MCG/0.3ML injection INJECT AS DIRECTED .3 mL 0  . fluticasone (FLONASE) 50 MCG/ACT nasal spray Place 2 sprays into both nostrils daily. 48 g 3  . metFORMIN (GLUCOPHAGE) 500 MG tablet Take 1 tablet (500 mg total) by mouth 2 (two) times daily with a meal. 180 tablet 0  . amLODipine (NORVASC) 10 MG tablet Take 1 tablet (10 mg total) by mouth daily. 90 tablet 3  . atorvastatin (LIPITOR) 10 MG  tablet Take 1 tablet (10 mg total) by mouth daily. 90 tablet 3  . carvedilol (COREG) 6.25 MG tablet Take 1 tablet (6.25 mg total) by mouth 2 (two) times daily with a meal. 180 tablet 3  . hydrochlorothiazide (HYDRODIURIL) 25 MG tablet Take 1 tablet (25 mg total) by mouth daily. 90 tablet 3  . levocetirizine (XYZAL) 5 MG tablet Take 1 tablet (5 mg total) by mouth every evening. 90 tablet 3  . losartan (COZAAR) 50 MG tablet Take 1 tablet (50 mg total) by mouth daily. 90 tablet 3   No facility-administered medications prior to visit.    Allergies  Allergen Reactions  . Erythromycin     Causes stomach cramps  . Penicillins     rash    Review of Systems  Constitutional: Negative for fever.  HENT: Negative for congestion, ear pain, sinus pain and sore throat.   Eyes: Negative for pain.  Respiratory: Negative for cough, shortness of breath and wheezing.   Cardiovascular: Negative for chest pain and palpitations.  Gastrointestinal: Negative for blood in stool, constipation, diarrhea, nausea and vomiting.  Genitourinary: Negative for dysuria, frequency and hematuria.  Skin: Positive for itching and rash (Left buttock).  Neurological: Negative for dizziness and headaches.       Objective:    Physical Exam Constitutional:      General: She is not in acute distress.    Appearance: Normal appearance. She is not ill-appearing.  HENT:     Head: Normocephalic and atraumatic.     Right Ear: External ear normal.     Left Ear: External ear normal.  Eyes:     Extraocular Movements: Extraocular movements intact.     Pupils: Pupils are equal, round, and reactive to light.  Cardiovascular:     Rate and Rhythm: Normal rate and regular rhythm.     Pulses: Normal pulses.     Heart sounds: Normal heart sounds. No murmur heard. No gallop.   Pulmonary:     Effort: Pulmonary effort is normal. No respiratory distress.     Breath sounds: Normal breath sounds. No wheezing, rhonchi or rales.   Skin:    General: Skin is warm and dry.     Findings: Rash present. Rash is scaling.     Comments: Scaly raised dime sized rash below left buttock that is red and itchy.  Neurological:     Mental Status: She is alert and oriented to person,  place, and time.  Psychiatric:        Behavior: Behavior normal.     BP 126/70 (BP Location: Left Arm, Patient Position: Sitting, Cuff Size: Normal)   Pulse (!) 59   Temp 98.3 F (36.8 C) (Oral)   Resp 19   Ht 5\' 6"  (1.676 m)   Wt 189 lb (85.7 kg)   SpO2 98%   BMI 30.51 kg/m  Wt Readings from Last 3 Encounters:  03/26/21 189 lb (85.7 kg)  03/21/21 183 lb (83 kg)  02/27/21 183 lb (83 kg)    Diabetic Foot Exam - Simple   No data filed    Lab Results  Component Value Date   WBC 3.6 (L) 09/15/2017   HGB 12.7 09/15/2017   HCT 39.3 09/15/2017   PLT 349.0 09/15/2017   GLUCOSE 94 12/25/2020   CHOL 163 12/25/2020   TRIG 68 12/25/2020   HDL 49 12/25/2020   LDLDIRECT 140.5 08/12/2013   LDLCALC 101 (H) 12/25/2020   ALT 18 12/25/2020   AST 17 12/25/2020   NA 140 12/25/2020   K 4.7 12/25/2020   CL 102 12/25/2020   CREATININE 0.84 12/25/2020   BUN 16 12/25/2020   CO2 25 12/25/2020   TSH 2.530 04/08/2019   HGBA1C 5.3 12/25/2020   MICROALBUR <0.7 02/22/2015    Lab Results  Component Value Date   TSH 2.530 04/08/2019   Lab Results  Component Value Date   WBC 3.6 (L) 09/15/2017   HGB 12.7 09/15/2017   HCT 39.3 09/15/2017   MCV 88.7 09/15/2017   PLT 349.0 09/15/2017   Lab Results  Component Value Date   NA 140 12/25/2020   K 4.7 12/25/2020   CO2 25 12/25/2020   GLUCOSE 94 12/25/2020   BUN 16 12/25/2020   CREATININE 0.84 12/25/2020   BILITOT 0.3 12/25/2020   ALKPHOS 75 12/25/2020   AST 17 12/25/2020   ALT 18 12/25/2020   PROT 8.1 12/25/2020   ALBUMIN 4.8 12/25/2020   CALCIUM 10.1 12/25/2020   GFR 87.87 03/23/2020   Lab Results  Component Value Date   CHOL 163 12/25/2020   Lab Results  Component Value Date    HDL 49 12/25/2020   Lab Results  Component Value Date   LDLCALC 101 (H) 12/25/2020   Lab Results  Component Value Date   TRIG 68 12/25/2020   Lab Results  Component Value Date   CHOLHDL 4 03/23/2020   Lab Results  Component Value Date   HGBA1C 5.3 12/25/2020       Assessment & Plan:   Problem List Items Addressed This Visit      Unprioritized   Allergic contact dermatitis due to plants, except food - Primary    Cream per orders       Relevant Medications   triamcinolone cream (KENALOG) 0.1 %   Class 1 obesity with serious comorbidity and body mass index (BMI) of 30.0 to 30.9 in adult    Per [facility]      Essential hypertension    Well controlled, no changes to meds. Encouraged heart healthy diet such as the DASH diet and exercise as tolerated.       Relevant Medications   carvedilol (COREG) 6.25 MG tablet   losartan (COZAAR) 50 MG tablet   hydrochlorothiazide (HYDRODIURIL) 25 MG tablet   atorvastatin (LIPITOR) 10 MG tablet   amLODipine (NORVASC) 10 MG tablet   Hyperlipidemia    Encouraged heart healthy diet, increase exercise, avoid trans fats, consider a  krill oil cap daily      Relevant Medications   carvedilol (COREG) 6.25 MG tablet   losartan (COZAAR) 50 MG tablet   hydrochlorothiazide (HYDRODIURIL) 25 MG tablet   atorvastatin (LIPITOR) 10 MG tablet   amLODipine (NORVASC) 10 MG tablet    Other Visit Diagnoses    Seasonal allergic rhinitis       Relevant Medications   levocetirizine (XYZAL) 5 MG tablet       Meds ordered this encounter  Medications  . triamcinolone cream (KENALOG) 0.1 %    Sig: Apply 1 application topically 2 (two) times daily.    Dispense:  30 g    Refill:  0  . carvedilol (COREG) 6.25 MG tablet    Sig: Take 1 tablet (6.25 mg total) by mouth 2 (two) times daily with a meal.    Dispense:  180 tablet    Refill:  3  . losartan (COZAAR) 50 MG tablet    Sig: Take 1 tablet (50 mg total) by mouth daily.    Dispense:  90 tablet     Refill:  3  . levocetirizine (XYZAL) 5 MG tablet    Sig: Take 1 tablet (5 mg total) by mouth every evening.    Dispense:  90 tablet    Refill:  3  . hydrochlorothiazide (HYDRODIURIL) 25 MG tablet    Sig: Take 1 tablet (25 mg total) by mouth daily.    Dispense:  90 tablet    Refill:  3  . atorvastatin (LIPITOR) 10 MG tablet    Sig: Take 1 tablet (10 mg total) by mouth daily.    Dispense:  90 tablet    Refill:  3  . amLODipine (NORVASC) 10 MG tablet    Sig: Take 1 tablet (10 mg total) by mouth daily.    Dispense:  90 tablet    Refill:  3    I, Dr. Roma Schanz, DO, personally preformed the services described in this documentation.  All medical record entries made by the scribe were at my direction and in my presence.  I have reviewed the chart and discharge instructions (if applicable) and agree that the record reflects my personal performance and is accurate and complete. 03/26/2021   I,[name],acting as a scribe for Ann Held, DO.,have documented all relevant documentation on the behalf of Ann Held, DO,as directed by  Ann Held, DO while in the presence of Ann Held, DO.   Ann Held, DO

## 2021-03-26 NOTE — Patient Instructions (Signed)

## 2021-03-26 NOTE — Assessment & Plan Note (Signed)
Per [facility] 

## 2021-03-26 NOTE — Progress Notes (Signed)
   Covid-19 Vaccination Clinic  Name:  Alicia Knapp    MRN: [id number] DOB: 08-23-1956  03/26/2021  Alicia Knapp was observed post Covid-19 immunization for 15 minutes without incident. She was provided with Vaccine Information Sheet and instruction to access the V-Safe system.   Alicia Knapp was instructed to call 911 with any severe reactions post vaccine: [name] Difficulty breathing  . Swelling of face and throat  . A fast heartbeat  . A bad rash all over body  . Dizziness and weakness   Immunizations Administered    Name Date Dose VIS Date Route   PFIZER Comrnaty(Gray TOP) Covid-19 Vaccine 03/26/2021 10:39 AM 0.3 mL 10/11/2020 Intramuscular   Manufacturer: [employer], [employer]   Lot: [id number]   NDC: [phone]

## 2021-03-26 NOTE — Assessment & Plan Note (Signed)
Encouraged heart healthy diet, increase exercise, avoid trans fats, consider a krill oil cap daily 

## 2021-03-26 NOTE — Assessment & Plan Note (Signed)
Cream per orders

## 2021-04-02 ENCOUNTER — Other Ambulatory Visit (HOSPITAL_BASED_OUTPATIENT_CLINIC_OR_DEPARTMENT_OTHER): Payer: Self-pay

## 2021-04-02 MED ORDER — PFIZER-BIONT COVID-19 VAC-TRIS 30 MCG/0.3ML IM SUSP
INTRAMUSCULAR | 0 refills | Status: DC
  Filled 2021-04-02: qty 0.3, 1d supply, fill #0

## 2021-04-15 ENCOUNTER — Encounter (INDEPENDENT_AMBULATORY_CARE_PROVIDER_SITE_OTHER): Payer: Self-pay

## 2021-04-17 ENCOUNTER — Ambulatory Visit (INDEPENDENT_AMBULATORY_CARE_PROVIDER_SITE_OTHER): Admitting: Family Medicine

## 2021-05-08 ENCOUNTER — Encounter (INDEPENDENT_AMBULATORY_CARE_PROVIDER_SITE_OTHER): Payer: Self-pay | Admitting: Family Medicine

## 2021-05-08 ENCOUNTER — Other Ambulatory Visit: Payer: Self-pay

## 2021-05-08 ENCOUNTER — Ambulatory Visit (INDEPENDENT_AMBULATORY_CARE_PROVIDER_SITE_OTHER): Admitting: Family Medicine

## 2021-05-08 VITALS — BP 153/80 | HR 62 | Temp 98.5°F | Ht 66.0 in | Wt 187.0 lb

## 2021-05-08 DIAGNOSIS — E669 Obesity, unspecified: Secondary | ICD-10-CM

## 2021-05-08 DIAGNOSIS — Z6830 Body mass index (BMI) 30.0-30.9, adult: Secondary | ICD-10-CM | POA: Diagnosis not present

## 2021-05-08 DIAGNOSIS — I10 Essential (primary) hypertension: Secondary | ICD-10-CM | POA: Diagnosis not present

## 2021-05-08 DIAGNOSIS — R7303 Prediabetes: Secondary | ICD-10-CM

## 2021-05-13 NOTE — Progress Notes (Signed)
Chief Complaint:   OBESITY Alicia Knapp is here to discuss her progress with her obesity treatment plan along with follow-up of her obesity related diagnoses. Alicia Knapp is on the Category 2 Plan and states she is following her eating plan approximately 50-60% of the time. Alicia Knapp states she is weight training and cardio 30-60 minutes 5 times per week.  Today's visit was #: 87 Starting weight: 212 lbs Starting date: 08/03/2018 Today's weight: 187 lbs Today's date: 05/08/2021 Total lbs lost to date: 25 Total lbs lost since last in-office visit: 0  Interim History: Alicia Knapp has had a few cookouts and has increased eating out over the last few weeks. She is doing well on her physical activity- hasn't slacked on that. Alicia Knapp is gardening and getting quite a bit of produce from her garden. She has no upcoming plans. She went downtown for the 4th pf July.  Subjective:   1. Essential hypertension Myeisha's BP was well controlled previously. Alicia Knapp denies chest pain/chest pressure/headache.  2. Pre-diabetes Saleena's last A1c was 5.1 and insulin level 11.2. She is on Metformin.  Assessment/Plan:   1. Essential hypertension Alicia Knapp is working on healthy weight loss and exercise to improve blood pressure control. We will watch for signs of hypotension as she continues her lifestyle modifications. Continue current meds. Follow up BP at next appt.  2. Pre-diabetes Alicia Knapp will continue to work on weight loss, exercise, and decreasing simple carbohydrates to help decrease the risk of diabetes. Continue Metformin. No refill needed at this time. Labs at next appt with Dr. Cheri Rous.  3. Class 1 obesity with serious comorbidity and body mass index (BMI) of 30.0 to 30.9 in adult, unspecified obesity type  Alicia Knapp is currently in the action stage of change. As such, her goal is to continue with weight loss efforts. She has agreed to the Category 2 Plan.   Exercise goals:  As is  Behavioral modification strategies: increasing lean protein  intake, meal planning and cooking strategies, keeping healthy foods in the home, and planning for success.  Alicia Knapp has agreed to follow-up with our clinic in 4 weeks. She was informed of the importance of frequent follow-up visits to maximize her success with intensive lifestyle modifications for her multiple health conditions.   Objective:   Blood pressure (!) 153/80, pulse 62, temperature 98.5 F (36.9 C), height 5\' 6"  (1.676 m), weight 187 lb (84.8 kg), SpO2 100 %. Body mass index is 30.18 kg/m.  General: Cooperative, alert, well developed, in no acute distress. HEENT: Conjunctivae and lids unremarkable. Cardiovascular: Regular rhythm.  Lungs: Normal work of breathing. Neurologic: No focal deficits.   Lab Results  Component Value Date   CREATININE 0.84 12/25/2020   BUN 16 12/25/2020   NA 140 12/25/2020   K 4.7 12/25/2020   CL 102 12/25/2020   CO2 25 12/25/2020   Lab Results  Component Value Date   ALT 18 12/25/2020   AST 17 12/25/2020   ALKPHOS 75 12/25/2020   BILITOT 0.3 12/25/2020   Lab Results  Component Value Date   HGBA1C 5.3 12/25/2020   HGBA1C 5.3 08/08/2020   HGBA1C 5.1 08/16/2019   HGBA1C 5.4 04/08/2019   HGBA1C 5.5 11/09/2018   Lab Results  Component Value Date   INSULIN 11.2 12/25/2020   INSULIN 20.6 08/08/2020   INSULIN 10.4 08/16/2019   INSULIN 20.3 04/08/2019   INSULIN 13.8 11/09/2018   Lab Results  Component Value Date   TSH 2.530 04/08/2019   Lab Results  Component Value  Date   CHOL 163 12/25/2020   HDL 49 12/25/2020   LDLCALC 101 (H) 12/25/2020   LDLDIRECT 140.5 08/12/2013   TRIG 68 12/25/2020   CHOLHDL 4 03/23/2020   Lab Results  Component Value Date   VD25OH 66.09 03/23/2020   VD25OH 38.4 08/16/2019   VD25OH 40.1 04/08/2019   Lab Results  Component Value Date   WBC 3.6 (L) 09/15/2017   HGB 12.7 09/15/2017   HCT 39.3 09/15/2017   MCV 88.7 09/15/2017   PLT 349.0 09/15/2017    Attestation Statements:   Reviewed by  clinician on day of visit: allergies, medications, problem list, medical history, surgical history, family history, social history, and previous encounter notes.  Coral Ceo, CMA, am acting as transcriptionist for Coralie Common, MD.   I have reviewed the above documentation for accuracy and completeness, and I agree with the above. - Coralie Common, MD

## 2021-06-05 ENCOUNTER — Other Ambulatory Visit: Payer: Self-pay

## 2021-06-05 ENCOUNTER — Ambulatory Visit (INDEPENDENT_AMBULATORY_CARE_PROVIDER_SITE_OTHER): Admitting: Family Medicine

## 2021-06-05 ENCOUNTER — Encounter (INDEPENDENT_AMBULATORY_CARE_PROVIDER_SITE_OTHER): Payer: Self-pay | Admitting: Family Medicine

## 2021-06-05 VITALS — BP 137/78 | HR 65 | Temp 98.5°F | Ht 66.0 in | Wt 183.0 lb

## 2021-06-05 DIAGNOSIS — E669 Obesity, unspecified: Secondary | ICD-10-CM | POA: Diagnosis not present

## 2021-06-05 DIAGNOSIS — Z9189 Other specified personal risk factors, not elsewhere classified: Secondary | ICD-10-CM | POA: Diagnosis not present

## 2021-06-05 DIAGNOSIS — R7303 Prediabetes: Secondary | ICD-10-CM | POA: Diagnosis not present

## 2021-06-05 DIAGNOSIS — Z6834 Body mass index (BMI) 34.0-34.9, adult: Secondary | ICD-10-CM

## 2021-06-05 DIAGNOSIS — I10 Essential (primary) hypertension: Secondary | ICD-10-CM | POA: Diagnosis not present

## 2021-06-05 MED ORDER — METFORMIN HCL 500 MG PO TABS: 500.0000 mg | ORAL_TABLET | Freq: Two times a day (BID) | ORAL | 0 refills | Status: DC

## 2021-06-06 NOTE — Progress Notes (Signed)
Chief Complaint:   OBESITY Alicia Knapp is here to discuss her progress with her obesity treatment plan along with follow-up of her obesity related diagnoses. Alicia Knapp is on the Category 2 Plan and states she is following her eating plan approximately 90% of the time. Alicia Knapp states she is weight training and cardio 30-60 minutes 2-3 times per week.  Today's visit was #: 84 Starting weight: 212 lbs Starting date: 08/03/2018 Today's weight: 183 lbs Today's date: 06/05/2021 Total lbs lost to date: 29 Total lbs lost since last in-office visit: 4  Interim History: Alicia Knapp voices that she has really been on track with being more mindful and in control of intake. She is eating out occasionally and following "[employer] the Road" handout. She is considering going to [place] for a week (23-26th).  Subjective:   1. Essential hypertension Alicia Knapp's BP is well controlled today. Pt denies chest pain/chest pressure/headache.  2. Pre-diabetes Pt is on Metformin 500 mg BID. Her last A1c was 5.3 and insulin level 17.2.  3. At risk of diabetes mellitus Alicia Knapp is at higher than average risk for developing diabetes due to obesity.   Assessment/Plan:   1. Essential hypertension Alicia Knapp is working on healthy weight loss and exercise to improve blood pressure control. We will watch for signs of hypotension as she continues her lifestyle modifications. Continue amlodipine and Cozaar. No refill needed at this time.  2. Pre-diabetes Alicia Knapp will continue to work on weight loss, exercise, and decreasing simple carbohydrates to help decrease the risk of diabetes.   Refill- metFORMIN (GLUCOPHAGE) 500 MG tablet; Take 1 tablet (500 mg total) by mouth 2 (two) times daily with a meal.  Dispense: 180 tablet; Refill: 0  3. At risk of diabetes mellitus Alicia Knapp was given approximately 15 minutes of diabetes education and counseling today. We discussed intensive lifestyle modifications today with an emphasis on weight loss as well as  increasing exercise and decreasing simple carbohydrates in her diet. We also reviewed medication options with an emphasis on risk versus benefit of those discussed.   Repetitive spaced learning was employed today to elicit superior memory formation and behavioral change.  4. Obesity with current BMI of 29.6  Alicia Knapp is currently in the action stage of change. As such, her goal is to continue with weight loss efforts. She has agreed to the Category 2 Plan.   Exercise goals: All adults should avoid inactivity. Some physical activity is better than none, and adults who participate in any amount of physical activity gain some health benefits.  Behavioral modification strategies: increasing lean protein intake, meal planning and cooking strategies, keeping healthy foods in the home, and planning for success.  Kinly has agreed to follow-up with our clinic in 4 weeks. She was informed of the importance of frequent follow-up visits to maximize her success with intensive lifestyle modifications for her multiple health conditions.   Objective:   Blood pressure 137/78, pulse 65, temperature 98.5 F (36.9 C), height '5\' 6"'$  (1.676 m), weight 183 lb (83 kg), SpO2 100 %. Body mass index is 29.54 kg/m.  General: Cooperative, alert, well developed, in no acute distress. HEENT: Conjunctivae and lids unremarkable. Cardiovascular: Regular rhythm.  Lungs: Normal work of breathing. Neurologic: No focal deficits.   Lab Results  Component Value Date   CREATININE 0.84 12/25/2020   BUN 16 12/25/2020   NA 140 12/25/2020   K 4.7 12/25/2020   CL 102 12/25/2020   CO2 25 12/25/2020   Lab Results  Component Value  Date   ALT 18 12/25/2020   AST 17 12/25/2020   ALKPHOS 75 12/25/2020   BILITOT 0.3 12/25/2020   Lab Results  Component Value Date   HGBA1C 5.3 12/25/2020   HGBA1C 5.3 08/08/2020   HGBA1C 5.1 08/16/2019   HGBA1C 5.4 04/08/2019   HGBA1C 5.5 11/09/2018   Lab Results  Component Value Date    INSULIN 11.2 12/25/2020   INSULIN 20.6 08/08/2020   INSULIN 10.4 08/16/2019   INSULIN 20.3 04/08/2019   INSULIN 13.8 11/09/2018   Lab Results  Component Value Date   TSH 2.530 04/08/2019   Lab Results  Component Value Date   CHOL 163 12/25/2020   HDL 49 12/25/2020   LDLCALC 101 (H) 12/25/2020   LDLDIRECT 140.5 08/12/2013   TRIG 68 12/25/2020   CHOLHDL 4 03/23/2020   Lab Results  Component Value Date   VD25OH 66.09 03/23/2020   VD25OH 38.4 08/16/2019   VD25OH 40.1 04/08/2019   Lab Results  Component Value Date   WBC 3.6 (L) 09/15/2017   HGB 12.7 09/15/2017   HCT 39.3 09/15/2017   MCV 88.7 09/15/2017   PLT 349.0 09/15/2017    Attestation Statements:   Reviewed by clinician on day of visit: allergies, medications, problem list, medical history, surgical history, family history, social history, and previous encounter notes.  Coral Ceo, CMA, am acting as transcriptionist for Coralie Common, MD.  I have reviewed the above documentation for accuracy and completeness, and I agree with the above. - Coralie Common, MD

## 2021-06-17 ENCOUNTER — Ambulatory Visit
Admission: RE | Admit: 2021-06-17 | Discharge: 2021-06-17 | Disposition: A | Discharge status: Home / Self Care | Source: Ambulatory Visit | Attending: Family Medicine | Admitting: Family Medicine

## 2021-06-17 ENCOUNTER — Other Ambulatory Visit: Payer: Self-pay

## 2021-06-17 DIAGNOSIS — E2839 Other primary ovarian failure: Secondary | ICD-10-CM

## 2021-07-02 ENCOUNTER — Other Ambulatory Visit (HOSPITAL_BASED_OUTPATIENT_CLINIC_OR_DEPARTMENT_OTHER): Payer: Self-pay

## 2021-07-03 ENCOUNTER — Encounter (INDEPENDENT_AMBULATORY_CARE_PROVIDER_SITE_OTHER): Payer: Self-pay | Admitting: Family Medicine

## 2021-07-03 ENCOUNTER — Other Ambulatory Visit: Payer: Self-pay

## 2021-07-03 ENCOUNTER — Ambulatory Visit (INDEPENDENT_AMBULATORY_CARE_PROVIDER_SITE_OTHER): Admitting: Family Medicine

## 2021-07-03 VITALS — BP 111/69 | HR 57 | Temp 98.4°F | Ht 66.0 in | Wt 185.0 lb

## 2021-07-03 DIAGNOSIS — E7849 Other hyperlipidemia: Secondary | ICD-10-CM

## 2021-07-03 DIAGNOSIS — E669 Obesity, unspecified: Secondary | ICD-10-CM

## 2021-07-03 DIAGNOSIS — Z6834 Body mass index (BMI) 34.0-34.9, adult: Secondary | ICD-10-CM

## 2021-07-03 DIAGNOSIS — I10 Essential (primary) hypertension: Secondary | ICD-10-CM | POA: Diagnosis not present

## 2021-07-03 NOTE — Progress Notes (Signed)
Chief Complaint:   OBESITY Alicia Knapp is here to discuss her progress with her obesity treatment plan along with follow-up of her obesity related diagnoses. Alicia Knapp is on the Category 2 Plan and states she is following her eating plan approximately 50% of the time. Alicia Knapp states she is not currently exercising.  Today's visit was #: 24 Starting weight: 212 lbs Starting date: 08/03/2018 Today's weight: 185 lbs Today's date: 07/03/2021 Total lbs lost to date: 27 Total lbs lost since last in-office visit: 0  Interim History: Alicia Knapp went to [employer] and ate some samples of indulgent options. She has no plans for Labor Day, but is going back to [place] for her anniversary. Her goal is to get back into her exercise routine the end of the week/early next week. Pt wants to get back on track.  Subjective:   1. Essential hypertension BP well controlled today. Pt denies chest pain/chest pressure/headache. She is on losartan and amlodipine.  2. Other hyperlipidemia Alicia Knapp is on statin therapy and denies myalgias or transaminitis.  Assessment/Plan:   1. Essential hypertension Alicia Knapp is working on healthy weight loss and exercise to improve blood pressure control. We will watch for signs of hypotension as she continues her lifestyle modifications. Follow up at next appt.  2. Other hyperlipidemia Cardiovascular risk and specific lipid/LDL goals reviewed.  We discussed several lifestyle modifications today and Alicia Knapp will continue to work on diet, exercise and weight loss efforts. Orders and follow up as documented in patient record. Continue statin.  Counseling Intensive lifestyle modifications are the first line treatment for this issue. Dietary changes: Increase soluble fiber. Decrease simple carbohydrates. Exercise changes: Moderate to vigorous-intensity aerobic activity 150 minutes per week if tolerated. Lipid-lowering medications: see documented in medical record.  3. Obesity with current  BMI of 29.9  Alicia Knapp is currently in the action stage of change. As such, her goal is to continue with weight loss efforts. She has agreed to the Category 2 Plan.   Exercise goals: All adults should avoid inactivity. Some physical activity is better than none, and adults who participate in any amount of physical activity gain some health benefits.  Behavioral modification strategies: increasing lean protein intake, meal planning and cooking strategies, keeping healthy foods in the home, emotional eating strategies, and planning for success.  Alicia Knapp has agreed to follow-up with our clinic in 4 weeks. She was informed of the importance of frequent follow-up visits to maximize her success with intensive lifestyle modifications for her multiple health conditions.   Objective:   Blood pressure 111/69, pulse (!) 57, temperature 98.4 F (36.9 C), height '5\' 6"'$  (1.676 m), weight 185 lb (83.9 kg), SpO2 100 %. Body mass index is 29.86 kg/m.  General: Cooperative, alert, well developed, in no acute distress. HEENT: Conjunctivae and lids unremarkable. Cardiovascular: Regular rhythm.  Lungs: Normal work of breathing. Neurologic: No focal deficits.   Lab Results  Component Value Date   CREATININE 0.84 12/25/2020   BUN 16 12/25/2020   NA 140 12/25/2020   K 4.7 12/25/2020   CL 102 12/25/2020   CO2 25 12/25/2020   Lab Results  Component Value Date   ALT 18 12/25/2020   AST 17 12/25/2020   ALKPHOS 75 12/25/2020   BILITOT 0.3 12/25/2020   Lab Results  Component Value Date   HGBA1C 5.3 12/25/2020   HGBA1C 5.3 08/08/2020   HGBA1C 5.1 08/16/2019   HGBA1C 5.4 04/08/2019   HGBA1C 5.5 11/09/2018   Lab Results  Component  Value Date   INSULIN 11.2 12/25/2020   INSULIN 20.6 08/08/2020   INSULIN 10.4 08/16/2019   INSULIN 20.3 04/08/2019   INSULIN 13.8 11/09/2018   Lab Results  Component Value Date   TSH 2.530 04/08/2019   Lab Results  Component Value Date   CHOL 163 12/25/2020   HDL 49  12/25/2020   LDLCALC 101 (H) 12/25/2020   LDLDIRECT 140.5 08/12/2013   TRIG 68 12/25/2020   CHOLHDL 4 03/23/2020   Lab Results  Component Value Date   VD25OH 66.09 03/23/2020   VD25OH 38.4 08/16/2019   VD25OH 40.1 04/08/2019   Lab Results  Component Value Date   WBC 3.6 (L) 09/15/2017   HGB 12.7 09/15/2017   HCT 39.3 09/15/2017   MCV 88.7 09/15/2017   PLT 349.0 09/15/2017   Attestation Statements:   Reviewed by clinician on day of visit: allergies, medications, problem list, medical history, surgical history, family history, social history, and previous encounter notes.  Time spent on visit including pre-visit chart review and post-visit care and charting was 12 minutes.   Coral Ceo, CMA, am acting as transcriptionist for Coralie Common, MD.   I have reviewed the above documentation for accuracy and completeness, and I agree with the above. - Coralie Common, MD

## 2021-07-08 ENCOUNTER — Encounter: Payer: Self-pay | Admitting: Internal Medicine

## 2021-07-31 ENCOUNTER — Ambulatory Visit (INDEPENDENT_AMBULATORY_CARE_PROVIDER_SITE_OTHER): Admitting: Family Medicine

## 2021-08-01 ENCOUNTER — Other Ambulatory Visit: Payer: Self-pay

## 2021-08-01 ENCOUNTER — Ambulatory Visit: Attending: Internal Medicine

## 2021-08-01 DIAGNOSIS — Z23 Encounter for immunization: Secondary | ICD-10-CM

## 2021-08-02 NOTE — Progress Notes (Signed)
   Covid-19 Vaccination Clinic  Name:  Alicia Knapp    MRN: [id number] DOB: Oct 20, 1956  08/02/2021  [name] was observed post Covid-19 immunization for 15 minutes without incident. She was provided with Vaccine Information Sheet and instruction to access the V-Safe system.   [name] was instructed to call 911 with any severe reactions post vaccine: Difficulty breathing  Swelling of face and throat  A fast heartbeat  A bad rash all over body  Dizziness and weakness

## 2021-08-05 ENCOUNTER — Other Ambulatory Visit: Payer: Self-pay

## 2021-08-05 ENCOUNTER — Encounter (INDEPENDENT_AMBULATORY_CARE_PROVIDER_SITE_OTHER): Payer: Self-pay | Admitting: Family Medicine

## 2021-08-05 ENCOUNTER — Telehealth (INDEPENDENT_AMBULATORY_CARE_PROVIDER_SITE_OTHER): Payer: Self-pay | Admitting: Family Medicine

## 2021-08-05 ENCOUNTER — Telehealth (INDEPENDENT_AMBULATORY_CARE_PROVIDER_SITE_OTHER): Admitting: Family Medicine

## 2021-08-05 DIAGNOSIS — E8881 Metabolic syndrome: Secondary | ICD-10-CM

## 2021-08-05 DIAGNOSIS — E669 Obesity, unspecified: Secondary | ICD-10-CM

## 2021-08-05 DIAGNOSIS — Z6834 Body mass index (BMI) 34.0-34.9, adult: Secondary | ICD-10-CM | POA: Diagnosis not present

## 2021-08-05 NOTE — Telephone Encounter (Signed)
I think you had called pt

## 2021-08-05 NOTE — Telephone Encounter (Signed)
Patient said she answered the phone and it dropped the call, she was expecting a call back from a nurse.  She doesn't remember who she was talking with.
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
## 2021-08-12 ENCOUNTER — Other Ambulatory Visit (HOSPITAL_BASED_OUTPATIENT_CLINIC_OR_DEPARTMENT_OTHER): Payer: Self-pay

## 2021-08-12 MED ORDER — COVID-19MRNA BIVAL VACC PFIZER 30 MCG/0.3ML IM SUSP
INTRAMUSCULAR | 0 refills | Status: DC
  Filled 2021-08-12: qty 0.3, 1d supply, fill #0

## 2021-08-22 ENCOUNTER — Ambulatory Visit (INDEPENDENT_AMBULATORY_CARE_PROVIDER_SITE_OTHER): Admitting: Family Medicine

## 2021-08-28 ENCOUNTER — Other Ambulatory Visit: Payer: Self-pay | Admitting: Family Medicine

## 2021-08-28 DIAGNOSIS — J302 Other seasonal allergic rhinitis: Secondary | ICD-10-CM

## 2021-09-05 ENCOUNTER — Other Ambulatory Visit: Payer: Self-pay

## 2021-09-05 ENCOUNTER — Encounter (INDEPENDENT_AMBULATORY_CARE_PROVIDER_SITE_OTHER): Payer: Self-pay | Admitting: Family Medicine

## 2021-09-05 ENCOUNTER — Ambulatory Visit (INDEPENDENT_AMBULATORY_CARE_PROVIDER_SITE_OTHER): Admitting: Family Medicine

## 2021-09-05 VITALS — BP 116/71 | HR 60 | Temp 98.4°F | Ht 66.0 in | Wt 188.0 lb

## 2021-09-05 DIAGNOSIS — E669 Obesity, unspecified: Secondary | ICD-10-CM

## 2021-09-05 DIAGNOSIS — Z6834 Body mass index (BMI) 34.0-34.9, adult: Secondary | ICD-10-CM

## 2021-09-05 DIAGNOSIS — R7303 Prediabetes: Secondary | ICD-10-CM

## 2021-09-05 DIAGNOSIS — I10 Essential (primary) hypertension: Secondary | ICD-10-CM

## 2021-09-05 MED ORDER — METFORMIN HCL 500 MG PO TABS: 500.0000 mg | ORAL_TABLET | Freq: Two times a day (BID) | ORAL | 0 refills | Status: DC

## 2021-09-05 NOTE — Progress Notes (Signed)
Chief Complaint:   OBESITY Alicia Knapp is here to discuss her progress with her obesity treatment plan along with follow-up of her obesity related diagnoses. Alicia Knapp is on the Category 2 Plan and states she is following her eating plan approximately 80% of the time. Alicia Knapp states she is weight training and resistance bands 30 minutes 5 times per week.  Today's visit was #: 58 Starting weight: 212 lbs Starting date: 08/03/2018 Today's weight: 188 lbs Today's date: 09/05/2021 Total lbs lost to date: 24 Total lbs lost since last in-office visit: 0  Interim History: Alicia Knapp went out of town over the last few weeks and recognized she may have over indulged in bread. She is considering going home for a few days over Thanksgiving. She will do traditional dinner. Pt is seeing Dr. Carollee Herter on 11/29 and will get labs done at that appt.  Subjective:   1. Pre-diabetes Alicia Knapp is on Metformin with no GI side effects.  2. Essential hypertension BP well controlled. Pt denies chest pain/chest pressure/headache.  Assessment/Plan:   1. Pre-diabetes Alicia Knapp will continue to work on weight loss, exercise, and decreasing simple carbohydrates to help decrease the risk of diabetes. Labs will be done by PCP at her routine physical.  Refill- metFORMIN (GLUCOPHAGE) 500 MG tablet; Take 1 tablet (500 mg total) by mouth 2 (two) times daily with a meal.  Dispense: 180 tablet; Refill: 0  2. Essential hypertension Alicia Knapp is working on healthy weight loss and exercise to improve blood pressure control. We will watch for signs of hypotension as she continues her lifestyle modifications. Continue current meds with no change in doses.  3. Obesity with current BMI of 30.4  Alicia Knapp is currently in the action stage of change. As such, her goal is to continue with weight loss efforts. She has agreed to the Category 2 Plan.   Exercise goals:  As is  Behavioral modification strategies: increasing lean protein intake, meal planning and  cooking strategies, keeping healthy foods in the home, and planning for success.  Alicia Knapp has agreed to follow-up with our clinic in 3-4 weeks. She was informed of the importance of frequent follow-up visits to maximize her success with intensive lifestyle modifications for her multiple health conditions.   Objective:   Blood pressure 116/71, pulse 60, temperature 98.4 F (36.9 C), height 5\' 6"  (1.676 m), weight 188 lb (85.3 kg), SpO2 100 %. Body mass index is 30.34 kg/m.  General: Cooperative, alert, well developed, in no acute distress. HEENT: Conjunctivae and lids unremarkable. Cardiovascular: Regular rhythm.  Lungs: Normal work of breathing. Neurologic: No focal deficits.   Lab Results  Component Value Date   CREATININE 0.84 12/25/2020   BUN 16 12/25/2020   NA 140 12/25/2020   K 4.7 12/25/2020   CL 102 12/25/2020   CO2 25 12/25/2020   Lab Results  Component Value Date   ALT 18 12/25/2020   AST 17 12/25/2020   ALKPHOS 75 12/25/2020   BILITOT 0.3 12/25/2020   Lab Results  Component Value Date   HGBA1C 5.3 12/25/2020   HGBA1C 5.3 08/08/2020   HGBA1C 5.1 08/16/2019   HGBA1C 5.4 04/08/2019   HGBA1C 5.5 11/09/2018   Lab Results  Component Value Date   INSULIN 11.2 12/25/2020   INSULIN 20.6 08/08/2020   INSULIN 10.4 08/16/2019   INSULIN 20.3 04/08/2019   INSULIN 13.8 11/09/2018   Lab Results  Component Value Date   TSH 2.530 04/08/2019   Lab Results  Component Value Date  CHOL 163 12/25/2020   HDL 49 12/25/2020   LDLCALC 101 (H) 12/25/2020   LDLDIRECT 140.5 08/12/2013   TRIG 68 12/25/2020   CHOLHDL 4 03/23/2020   Lab Results  Component Value Date   VD25OH 66.09 03/23/2020   VD25OH 38.4 08/16/2019   VD25OH 40.1 04/08/2019   Lab Results  Component Value Date   WBC 3.6 (L) 09/15/2017   HGB 12.7 09/15/2017   HCT 39.3 09/15/2017   MCV 88.7 09/15/2017   PLT 349.0 09/15/2017    Attestation Statements:   Reviewed by clinician on day of visit:  allergies, medications, problem list, medical history, surgical history, family history, social history, and previous encounter notes.  Coral Ceo, CMA, am acting as transcriptionist for Coralie Common, MD.  I have reviewed the above documentation for accuracy and completeness, and I agree with the above. - Coralie Common, MD

## 2021-09-30 ENCOUNTER — Other Ambulatory Visit: Payer: Self-pay

## 2021-10-01 ENCOUNTER — Ambulatory Visit (INDEPENDENT_AMBULATORY_CARE_PROVIDER_SITE_OTHER): Admitting: Family Medicine

## 2021-10-01 ENCOUNTER — Encounter: Payer: Self-pay | Admitting: Family Medicine

## 2021-10-01 VITALS — BP 140/82 | HR 62 | Temp 98.5°F | Resp 18 | Ht 66.0 in | Wt 191.8 lb

## 2021-10-01 DIAGNOSIS — J302 Other seasonal allergic rhinitis: Secondary | ICD-10-CM

## 2021-10-01 DIAGNOSIS — E785 Hyperlipidemia, unspecified: Secondary | ICD-10-CM

## 2021-10-01 DIAGNOSIS — I10 Essential (primary) hypertension: Secondary | ICD-10-CM | POA: Diagnosis not present

## 2021-10-01 DIAGNOSIS — H538 Other visual disturbances: Secondary | ICD-10-CM

## 2021-10-01 DIAGNOSIS — Z23 Encounter for immunization: Secondary | ICD-10-CM

## 2021-10-01 DIAGNOSIS — Z0000 Encounter for general adult medical examination without abnormal findings: Secondary | ICD-10-CM

## 2021-10-01 LAB — CBC WITH DIFFERENTIAL/PLATELET
Basophils Absolute: 0 10*3/uL (ref 0.0–0.1)
Basophils Relative: 1 % (ref 0.0–3.0)
Eosinophils Absolute: 0.1 10*3/uL (ref 0.0–0.7)
Eosinophils Relative: 1.7 % (ref 0.0–5.0)
HCT: 36.5 % (ref 36.0–46.0)
Hemoglobin: 11.9 g/dL — ABNORMAL LOW (ref 12.0–15.0)
Lymphocytes Relative: 33.2 % (ref 12.0–46.0)
Lymphs Abs: 1.1 10*3/uL (ref 0.7–4.0)
MCHC: 32.5 g/dL (ref 30.0–36.0)
MCV: 86.9 fl (ref 78.0–100.0)
Monocytes Absolute: 0.3 10*3/uL (ref 0.1–1.0)
Monocytes Relative: 7.9 % (ref 3.0–12.0)
Neutro Abs: 1.9 10*3/uL (ref 1.4–7.7)
Neutrophils Relative %: 56.2 % (ref 43.0–77.0)
Platelets: 340 10*3/uL (ref 150.0–400.0)
RBC: 4.2 Mil/uL (ref 3.87–5.11)
RDW: 13.5 % (ref 11.5–15.5)
WBC: 3.4 10*3/uL — ABNORMAL LOW (ref 4.0–10.5)

## 2021-10-01 LAB — COMPREHENSIVE METABOLIC PANEL
ALT: 21 U/L (ref 0–35)
AST: 16 U/L (ref 0–37)
Albumin: 4.5 g/dL (ref 3.5–5.2)
Alkaline Phosphatase: 58 U/L (ref 39–117)
BUN: 16 mg/dL (ref 6–23)
CO2: 30 mEq/L (ref 19–32)
Calcium: 9.9 mg/dL (ref 8.4–10.5)
Chloride: 103 mEq/L (ref 96–112)
Creatinine, Ser: 0.79 mg/dL (ref 0.40–1.20)
GFR: 79.41 mL/min (ref >60.00)
Glucose, Bld: 97 mg/dL (ref 70–99)
Potassium: 4.1 mEq/L (ref 3.5–5.1)
Sodium: 140 mEq/L (ref 135–145)
Total Bilirubin: 0.5 mg/dL (ref 0.2–1.2)
Total Protein: 7.4 g/dL (ref 6.0–8.3)

## 2021-10-01 LAB — HEMOGLOBIN A1C: Hgb A1c MFr Bld: 5.5 % (ref 4.6–6.5)

## 2021-10-01 LAB — LIPID PANEL
Cholesterol: 168 mg/dL (ref 0–200)
HDL: 49.8 mg/dL (ref >39.00)
LDL Cholesterol: 105 mg/dL — ABNORMAL HIGH (ref 0–99)
NonHDL: 118.34
Total CHOL/HDL Ratio: 3
Triglycerides: 66 mg/dL (ref 0.0–149.0)
VLDL: 13.2 mg/dL (ref 0.0–40.0)

## 2021-10-01 LAB — VITAMIN D 25 HYDROXY (VIT D DEFICIENCY, FRACTURES): VITD: 36.62 ng/mL (ref 30.00–100.00)

## 2021-10-01 LAB — TSH: TSH: 1.63 u[IU]/mL (ref 0.35–5.50)

## 2021-10-01 MED ORDER — CARVEDILOL 6.25 MG PO TABS: 6.2500 mg | ORAL_TABLET | Freq: Two times a day (BID) | ORAL | 3 refills | Status: DC

## 2021-10-01 MED ORDER — AMLODIPINE BESYLATE 10 MG PO TABS: 10.0000 mg | ORAL_TABLET | Freq: Every day | ORAL | 3 refills | Status: DC

## 2021-10-01 MED ORDER — HYDROCHLOROTHIAZIDE 25 MG PO TABS: 25.0000 mg | ORAL_TABLET | Freq: Every day | ORAL | 3 refills | Status: DC

## 2021-10-01 MED ORDER — LEVOCETIRIZINE DIHYDROCHLORIDE 5 MG PO TABS: 5.0000 mg | ORAL_TABLET | Freq: Every evening | ORAL | 3 refills | Status: DC

## 2021-10-01 MED ORDER — LOSARTAN POTASSIUM 50 MG PO TABS: 50.0000 mg | ORAL_TABLET | Freq: Every day | ORAL | 3 refills | Status: DC

## 2021-10-01 MED ORDER — FLUTICASONE PROPIONATE 50 MCG/ACT NA SUSP: 2.0000 | Freq: Every day | NASAL | 1 refills | Status: DC

## 2021-10-01 MED ORDER — ATORVASTATIN CALCIUM 10 MG PO TABS: 10.0000 mg | ORAL_TABLET | Freq: Every day | ORAL | 3 refills | Status: DC

## 2021-10-01 NOTE — Patient Instructions (Signed)
Preventive Care 92-63 Years Old, Female Preventive care refers to lifestyle choices and visits with your health care provider that can promote health and wellness. Preventive care visits are also called wellness exams. What can I expect for my preventive care visit? Counseling Your health care provider may ask you questions about your: Medical history, including: Past medical problems. Family medical history. Pregnancy history. Current health, including: Menstrual cycle. Method of birth control. Emotional well-being. Home life and relationship well-being. Sexual activity and sexual health. Lifestyle, including: Alcohol, nicotine or tobacco, and drug use. Access to firearms. Diet, exercise, and sleep habits. Work and work Statistician. Sunscreen use. Safety issues such as seatbelt and bike helmet use. Physical exam Your health care provider will check your: Height and weight. These may be used to calculate your BMI (body mass index). BMI is a measurement that tells if you are at a healthy weight. Waist circumference. This measures the distance around your waistline. This measurement also tells if you are at a healthy weight and may help predict your risk of certain diseases, such as type 2 diabetes and high blood pressure. Heart rate and blood pressure. Body temperature. Skin for abnormal spots. What immunizations do I need? Vaccines are usually given at various ages, according to a schedule. Your health care provider will recommend vaccines for you based on your age, medical history, and lifestyle or other factors, such as travel or where you work. What tests do I need? Screening Your health care provider may recommend screening tests for certain conditions. This may include: Lipid and cholesterol levels. Diabetes screening. This is done by checking your blood sugar (glucose) after you have not eaten for a while (fasting). Pelvic exam and Pap test. Hepatitis B test. Hepatitis C  test. HIV (human immunodeficiency virus) test. STI (sexually transmitted infection) testing, if you are at risk. Lung cancer screening. Colorectal cancer screening. Mammogram. Talk with your health care provider about when you should start having regular mammograms. This may depend on whether you have a family history of breast cancer. BRCA-related cancer screening. This may be done if you have a family history of breast, ovarian, tubal, or peritoneal cancers. Bone density scan. This is done to screen for osteoporosis. Talk with your health care provider about your test results, treatment options, and if necessary, the need for more tests. Follow these instructions at home: Eating and drinking  Eat a diet that includes fresh fruits and vegetables, whole grains, lean protein, and low-fat dairy products. Take vitamin and mineral supplements as recommended by your health care provider. Do not drink alcohol if: Your health care provider tells you not to drink. You are pregnant, may be pregnant, or are planning to become pregnant. If you drink alcohol: Limit how much you have to 0-1 drink a day. Know how much alcohol is in your drink. In the [place] one drink equals one 12 oz bottle of beer (355 mL), one 5 oz glass of wine (148 mL), or one 1 oz glass of hard liquor (44 mL). Lifestyle Brush your teeth every morning and night with fluoride toothpaste. Floss one time each day. Exercise for at least 30 minutes 5 or more days each week. Do not use any products that contain nicotine or tobacco. These products include cigarettes, chewing tobacco, and vaping devices, such as e-cigarettes. If you need help quitting, ask your health care provider. Do not use drugs. If you are sexually active, practice safe sex. Use a condom or other form of protection to prevent  STIs. If you do not wish to become pregnant, use a form of birth control. If you plan to become pregnant, see your health care provider for a  prepregnancy visit. Take aspirin only as told by your health care provider. Make sure that you understand how much to take and what form to take. Work with your health care provider to find out whether it is safe and beneficial for you to take aspirin daily. Find healthy ways to manage stress, such as: Meditation, yoga, or listening to music. Journaling. Talking to a trusted person. Spending time with friends and family. Minimize exposure to UV radiation to reduce your risk of skin cancer. Safety Always wear your seat belt while driving or riding in a vehicle. Do not drive: If you have been drinking alcohol. Do not ride with someone who has been drinking. When you are tired or distracted. While texting. If you have been using any mind-altering substances or drugs. Wear a helmet and other protective equipment during sports activities. If you have firearms in your house, make sure you follow all gun safety procedures. Seek help if you have been physically or sexually abused. What's next? Visit your health care provider once a year for an annual wellness visit. Ask your health care provider how often you should have your eyes and teeth checked. Stay up to date on all vaccines. This information is not intended to replace advice given to you by your health care provider. Make sure you discuss any questions you have with your health care provider. Document Revised: 04/17/2021 Document Reviewed: 04/17/2021 Elsevier Patient Education  [employer].

## 2021-10-01 NOTE — Progress Notes (Signed)
Subjective:     Alicia Knapp is a 65 y.o. female and is here for a comprehensive physical exam. The patient reports problems - blurry vision --- she is requesting a eye appointment. . No other complaints.   F/u on bp and cholesterol as well.    Social History   Socioeconomic History   Marital status: Married    Spouse name: [name]   Number of children: Not on file   Years of education: Not on file   Highest education level: Not on file  Occupational History   Occupation: Stay at home spouse  Tobacco Use   Smoking status: Former    Types: Cigarettes    Quit date: 09/17/2014    Years since quitting: 7.0   Smokeless tobacco: Never  Vaping Use   Vaping Use: Never used  Substance and Sexual Activity   Alcohol use: No    Alcohol/week: 0.0 standard drinks   Drug use: No   Sexual activity: Yes    Partners: Male  Other Topics Concern   Not on file  Social History Narrative   Not on file   Social Determinants of Health   Financial Resource Strain: Not on file  Food Insecurity: Not on file  Transportation Needs: Not on file  Physical Activity: Not on file  Stress: Not on file  Social Connections: Not on file  Intimate Partner Violence: Not on file   Health Maintenance  Topic Date Due   Zoster Vaccines- Shingrix (1 of 2) Never done   COLONOSCOPY (Pts 45-45yrs Insurance coverage will need to be confirmed)  06/28/2021   HIV Screening  10/01/2022 (Originally 12/20/1970)   INFLUENZA VACCINE  02/01/2027 (Originally 06/03/2021)   MAMMOGRAM  01/07/2022   TETANUS/TDAP  04/18/2026   COVID-19 Vaccine  Completed   Hepatitis C Screening  Completed   Pneumococcal Vaccine 20-79 Years old  Aged Out   HPV VACCINES  Aged Out    The following portions of the patient's history were reviewed and updated as appropriate: She  has a past medical history of Anxiety, B12 deficiency, Constipation, Heart murmur, Hyperlipidemia, Hypertension, Joint pain, Postoperative nausea, Prediabetes,  Swelling, and Vitamin D deficiency. She does not have any pertinent problems on file. She  has a past surgical history that includes Abdominal hysterectomy; Appendectomy; Colonoscopy (05/22/2015); and Polypectomy. Her family history includes Arthritis in her maternal grandmother; Cancer in her father and mother; Colon cancer (age of onset: 57) in her brother; Diabetes in her mother; Hyperlipidemia in her mother; Hypertension in her brother, mother, and sister; Kidney disease in her mother; Obesity in her mother; Prostate cancer in her father; Sleep apnea in her mother; Stroke in her brother; Thyroid disease in her mother. She  reports that she quit smoking about 7 years ago. Her smoking use included cigarettes. She has never used smokeless tobacco. She reports that she does not drink alcohol and does not use drugs. She has a current medication list which includes the following prescription(s): aripiprazole, aripiprazole, metformin, amlodipine, atorvastatin, carvedilol, fluticasone, hydrochlorothiazide, levocetirizine, losartan, and triamcinolone cream. Current Outpatient Medications on File Prior to Visit  Medication Sig Dispense Refill   ARIPiprazole (ABILIFY) 2 MG tablet Take 2 mg by mouth daily.      ARIPiprazole (ABILIFY) 5 MG tablet Take 1 tablet by mouth daily.  0   metFORMIN (GLUCOPHAGE) 500 MG tablet Take 1 tablet (500 mg total) by mouth 2 (two) times daily with a meal. 180 tablet 0   triamcinolone cream (KENALOG) 0.1 %  Apply 1 application topically 2 (two) times daily. (Patient not taking: Reported on 09/05/2021) 30 g 0   No current facility-administered medications on file prior to visit.   She is allergic to erythromycin and penicillins..  Review of Systems Review of Systems  Constitutional: Negative for activity change, appetite change and fatigue.  HENT: Negative for hearing loss, congestion, tinnitus and ear discharge.  dentist q75m Eyes: blurry vision Respiratory: Negative for  cough, chest tightness and shortness of breath.   Cardiovascular: Negative for chest pain, palpitations and leg swelling.  Gastrointestinal: Negative for abdominal pain, diarrhea, constipation and abdominal distention.  Genitourinary: Negative for urgency, frequency, decreased urine volume and difficulty urinating.  Musculoskeletal: Negative for back pain, arthralgias and gait problem.  Skin: Negative for color change, pallor and rash.  Neurological: Negative for dizziness, light-headedness, numbness and headaches.  Hematological: Negative for adenopathy. Does not bruise/bleed easily.  Psychiatric/Behavioral: Negative for suicidal ideas, confusion, sleep disturbance, self-injury, dysphoric mood, decreased concentration and agitation.      Objective:    BP 140/82 (BP Location: Left Arm, Patient Position: Sitting, Cuff Size: Normal)   Pulse 62   Temp 98.5 F (36.9 C) (Oral)   Resp 18   Ht 5\' 6"  (1.676 m)   Wt 191 lb 12.8 oz (87 kg)   SpO2 100%   BMI 30.96 kg/m  General appearance: alert, cooperative, and no distress Head: Normocephalic, without obvious abnormality, atraumatic Eyes: negative findings: lids and lashes normal and pupils equal, round, reactive to light and accomodation Ears: normal TM's and external ear canals both ears Nose: Nares normal. Septum midline. Mucosa normal. No drainage or sinus tenderness. Throat: lips, mucosa, and tongue normal; teeth and gums normal Neck: no adenopathy, no carotid bruit, no JVD, supple, symmetrical, trachea midline, and thyroid not enlarged, symmetric, no tenderness/mass/nodules Back: symmetric, no curvature. ROM normal. No CVA tenderness. Lungs: clear to auscultation bilaterally Heart: regular rate and rhythm, S1, S2 normal, no murmur, click, rub or gallop Abdomen: soft, non-tender; bowel sounds normal; no masses,  no organomegaly Pelvic: not indicated; status post hysterectomy, negative ROS Extremities: extremities normal, atraumatic,  no cyanosis or edema    Assessment:    Healthy female exam.      Plan:    Ghm utd Check labs  See After Visit Summary for Counseling Recommendations   1. Preventative health care See above - CBC with Differential/Platelet - Comprehensive metabolic panel - Lipid panel - TSH  2. Primary hypertension Well controlled, no changes to meds. Encouraged heart healthy diet such as the DASH diet and exercise as tolerated.   - CBC with Differential/Platelet - Comprehensive metabolic panel - Lipid panel - TSH  3. Blurry vision, bilateral  - Ambulatory referral to Ophthalmology  4. Essential hypertension Well controlled, no changes to meds. Encouraged heart healthy diet such as the DASH diet and exercise as tolerated.   - amLODipine (NORVASC) 10 MG tablet; Take 1 tablet (10 mg total) by mouth daily.  Dispense: 90 tablet; Refill: 3 - carvedilol (COREG) 6.25 MG tablet; Take 1 tablet (6.25 mg total) by mouth 2 (two) times daily with a meal.  Dispense: 180 tablet; Refill: 3 - hydrochlorothiazide (HYDRODIURIL) 25 MG tablet; Take 1 tablet (25 mg total) by mouth daily.  Dispense: 90 tablet; Refill: 3 - losartan (COZAAR) 50 MG tablet; Take 1 tablet (50 mg total) by mouth daily.  Dispense: 90 tablet; Refill: 3  5. Hyperlipidemia, unspecified hyperlipidemia type Encourage heart healthy diet such as MIND or DASH diet, increase  exercise, avoid trans fats, simple carbohydrates and processed foods, consider a krill or fish or flaxseed oil cap daily.   - atorvastatin (LIPITOR) 10 MG tablet; Take 1 tablet (10 mg total) by mouth daily.  Dispense: 90 tablet; Refill: 3  6. Seasonal allergic rhinitis stable - fluticasone (FLONASE) 50 MCG/ACT nasal spray; Place 2 sprays into both nostrils daily.  Dispense: 48 g; Refill: 1 - levocetirizine (XYZAL) 5 MG tablet; Take 1 tablet (5 mg total) by mouth every evening.  Dispense: 90 tablet; Refill: 3  7. Need for shingles vaccine Rto 2 months for #2 -  Varicella-zoster vaccine IM  8. Morbid obesity ([place]) Con't healthy weight and wellness - VITAMIN D 25 Hydroxy (Vit-D Deficiency, Fractures) - Hemoglobin A1c - Insulin, random

## 2021-10-02 LAB — INSULIN, RANDOM: Insulin: 9.3 u[IU]/mL

## 2021-10-09 ENCOUNTER — Ambulatory Visit (INDEPENDENT_AMBULATORY_CARE_PROVIDER_SITE_OTHER): Admitting: Family Medicine

## 2021-10-09 ENCOUNTER — Encounter (INDEPENDENT_AMBULATORY_CARE_PROVIDER_SITE_OTHER): Payer: Self-pay | Admitting: Family Medicine

## 2021-10-09 ENCOUNTER — Other Ambulatory Visit: Payer: Self-pay

## 2021-10-09 VITALS — BP 145/76 | HR 65 | Temp 98.2°F | Ht 66.0 in | Wt 189.0 lb

## 2021-10-09 DIAGNOSIS — Z6834 Body mass index (BMI) 34.0-34.9, adult: Secondary | ICD-10-CM

## 2021-10-09 DIAGNOSIS — E669 Obesity, unspecified: Secondary | ICD-10-CM

## 2021-10-09 DIAGNOSIS — I10 Essential (primary) hypertension: Secondary | ICD-10-CM | POA: Diagnosis not present

## 2021-10-09 DIAGNOSIS — E7849 Other hyperlipidemia: Secondary | ICD-10-CM

## 2021-10-09 NOTE — Progress Notes (Signed)
Chief Complaint:   OBESITY Alicia Knapp is here to discuss her progress with her obesity treatment plan along with follow-up of her obesity related diagnoses. Alicia Knapp is on the Category 2 Plan and states she is following her eating plan approximately 50% of the time. Alicia Knapp states she is doing weights and bands 30 minutes 2-3 times per week.  Today's visit was #: 24 Starting weight: 212 lbs Starting date: 08/03/2018 Today's weight: 189 lbs Today's date: 10/09/2021 Total lbs lost to date: 23 Total lbs lost since last in-office visit: 0  Interim History: Alicia Knapp got labs done 10/01/21 with Dr. Etter Sjogren. She is lifting weights 3 times a week. She thinks she wants to start coming in every 2 weeks. Her husband's birthday is today. She is celebrating Christmas at home. Pt wants to reduce frequency of eating out.  Subjective:   1. Other hyperlipidemia Alicia Knapp is on Lipitor. Her LDL is 105 (improved from 114) and she denies myalgias or transaminitis.  2. Essential hypertension BP slightly elevated today. Pt denies chest pain/chest pressure/headache. She is on amlodipine, Cozaar, and HCTZ.  Assessment/Plan:   1. Other hyperlipidemia Cardiovascular risk and specific lipid/LDL goals reviewed.  We discussed several lifestyle modifications today and Alicia Knapp will continue to work on diet, exercise and weight loss efforts. Orders and follow up as documented in patient record. Continue current treatment plan.  Counseling Intensive lifestyle modifications are the first line treatment for this issue. Dietary changes: Increase soluble fiber. Decrease simple carbohydrates. Exercise changes: Moderate to vigorous-intensity aerobic activity 150 minutes per week if tolerated. Lipid-lowering medications: see documented in medical record.  2. Essential hypertension Alicia Knapp is working on healthy weight loss and exercise to improve blood pressure control. We will watch for signs of hypotension as she continues her lifestyle  modifications. Continue current treatment plan.  3. Obesity with current BMI of 30.5  Alicia Knapp is currently in the action stage of change. As such, her goal is to continue with weight loss efforts. She has agreed to the Category 2 Plan.   Exercise goals: All adults should avoid inactivity. Some physical activity is better than none, and adults who participate in any amount of physical activity gain some health benefits. Increase either weight or reps in resistance training.  Behavioral modification strategies: increasing lean protein intake, decreasing eating out, meal planning and cooking strategies, and planning for success.  Alicia Knapp has agreed to follow-up with our clinic in 3 weeks. She was informed of the importance of frequent follow-up visits to maximize her success with intensive lifestyle modifications for her multiple health conditions.   Objective:   Blood pressure (!) 145/76, pulse 65, temperature 98.2 F (36.8 C), height 5\' 6"  (1.676 m), weight 189 lb (85.7 kg), SpO2 100 %. Body mass index is 30.51 kg/m.  General: Cooperative, alert, well developed, in no acute distress. HEENT: Conjunctivae and lids unremarkable. Cardiovascular: Regular rhythm.  Lungs: Normal work of breathing. Neurologic: No focal deficits.   Lab Results  Component Value Date   CREATININE 0.79 10/01/2021   BUN 16 10/01/2021   NA 140 10/01/2021   K 4.1 10/01/2021   CL 103 10/01/2021   CO2 30 10/01/2021   Lab Results  Component Value Date   ALT 21 10/01/2021   AST 16 10/01/2021   ALKPHOS 58 10/01/2021   BILITOT 0.5 10/01/2021   Lab Results  Component Value Date   HGBA1C 5.5 10/01/2021   HGBA1C 5.3 12/25/2020   HGBA1C 5.3 08/08/2020   HGBA1C 5.1 08/16/2019  HGBA1C 5.4 04/08/2019   Lab Results  Component Value Date   INSULIN 11.2 12/25/2020   INSULIN 20.6 08/08/2020   INSULIN 10.4 08/16/2019   INSULIN 20.3 04/08/2019   INSULIN 13.8 11/09/2018   Lab Results  Component Value Date   TSH  1.63 10/01/2021   Lab Results  Component Value Date   CHOL 168 10/01/2021   HDL 49.80 10/01/2021   LDLCALC 105 (H) 10/01/2021   LDLDIRECT 140.5 08/12/2013   TRIG 66.0 10/01/2021   CHOLHDL 3 10/01/2021   Lab Results  Component Value Date   VD25OH 36.62 10/01/2021   VD25OH 66.09 03/23/2020   VD25OH 38.4 08/16/2019   Lab Results  Component Value Date   WBC 3.4 (L) 10/01/2021   HGB 11.9 (L) 10/01/2021   HCT 36.5 10/01/2021   MCV 86.9 10/01/2021   PLT 340.0 10/01/2021    Attestation Statements:   Reviewed by clinician on day of visit: allergies, medications, problem list, medical history, surgical history, family history, social history, and previous encounter notes.  Coral Ceo, CMA, am acting as transcriptionist for Coralie Common, MD.  I have reviewed the above documentation for accuracy and completeness, and I agree with the above. - Coralie Common, MD

## 2021-11-05 LAB — HM DIABETES EYE EXAM

## 2021-11-06 ENCOUNTER — Ambulatory Visit (INDEPENDENT_AMBULATORY_CARE_PROVIDER_SITE_OTHER): Admitting: Family Medicine

## 2021-11-06 ENCOUNTER — Encounter: Payer: Self-pay | Admitting: Family Medicine

## 2021-11-06 ENCOUNTER — Encounter (INDEPENDENT_AMBULATORY_CARE_PROVIDER_SITE_OTHER): Payer: Self-pay | Admitting: Family Medicine

## 2021-11-06 ENCOUNTER — Other Ambulatory Visit: Payer: Self-pay

## 2021-11-06 VITALS — BP 127/74 | HR 68 | Temp 98.4°F | Ht 66.0 in | Wt 190.0 lb

## 2021-11-06 DIAGNOSIS — Z6834 Body mass index (BMI) 34.0-34.9, adult: Secondary | ICD-10-CM

## 2021-11-06 DIAGNOSIS — E669 Obesity, unspecified: Secondary | ICD-10-CM

## 2021-11-06 DIAGNOSIS — R7303 Prediabetes: Secondary | ICD-10-CM

## 2021-11-06 DIAGNOSIS — I10 Essential (primary) hypertension: Secondary | ICD-10-CM | POA: Diagnosis not present

## 2021-11-06 DIAGNOSIS — Z6830 Body mass index (BMI) 30.0-30.9, adult: Secondary | ICD-10-CM | POA: Diagnosis not present

## 2021-11-07 NOTE — Progress Notes (Signed)
Chief Complaint:   OBESITY Alicia Knapp is here to discuss her progress with her obesity treatment plan along with follow-up of her obesity related diagnoses. Alicia Knapp is on the Category 2 Plan and states she is following her eating plan approximately 40% of the time. Alicia Knapp states she is not currently exercising.  Today's visit was #: 67 Starting weight: 212 lbs Starting date: 08/03/2018 Today's weight: 190 lbs Today's date: 11/06/2021 Total lbs lost to date: 22 Total lbs lost since last in-office visit: 0  Interim History: Alicia Knapp wants to get back on track- feels she has been less structured. She is feeling somewhat bored with dinner options. Pt is wondering how to be less habitual with her food prep.  Subjective:   1. Essential hypertension BP very well controlled for today. Pt denies chest pain/chest pressure/headache. She is on Coreg, amlodipine, and Cozaar.  2. Prediabetes Alicia Knapp last A1c was controlled at 5.5. She is on Metformin.  Assessment/Plan:   1. Essential hypertension Alicia Knapp is working on healthy weight loss and exercise to improve blood pressure control. We will watch for signs of hypotension as she continues her lifestyle modifications. Continue current treatment plan.  2. Prediabetes Alicia Knapp will continue to work on weight loss, exercise, and decreasing simple carbohydrates to help decrease the risk of diabetes. Continue Metformin. No refill needed at this time.  3. Obesity with current BMI of 30.8  Alicia Knapp is currently in the action stage of change. As such, her goal is to continue with weight loss efforts. She has agreed to the Category 2 Plan.   Exercise goals: All adults should avoid inactivity. Some physical activity is better than none, and adults who participate in any amount of physical activity gain some health benefits.  Behavioral modification strategies: increasing lean protein intake, meal planning and cooking strategies, keeping healthy foods in the home, and  planning for success.  Alicia Knapp has agreed to follow-up with our clinic in 2 weeks. She was informed of the importance of frequent follow-up visits to maximize her success with intensive lifestyle modifications for her multiple health conditions.   Objective:   Blood pressure 127/74, pulse 68, temperature 98.4 F (36.9 C), height 5\' 6"  (1.676 m), weight 190 lb (86.2 kg), SpO2 100 %. Body mass index is 30.67 kg/m.  General: Cooperative, alert, well developed, in no acute distress. HEENT: Conjunctivae and lids unremarkable. Cardiovascular: Regular rhythm.  Lungs: Normal work of breathing. Neurologic: No focal deficits.   Lab Results  Component Value Date   CREATININE 0.79 10/01/2021   BUN 16 10/01/2021   NA 140 10/01/2021   K 4.1 10/01/2021   CL 103 10/01/2021   CO2 30 10/01/2021   Lab Results  Component Value Date   ALT 21 10/01/2021   AST 16 10/01/2021   ALKPHOS 58 10/01/2021   BILITOT 0.5 10/01/2021   Lab Results  Component Value Date   HGBA1C 5.5 10/01/2021   HGBA1C 5.3 12/25/2020   HGBA1C 5.3 08/08/2020   HGBA1C 5.1 08/16/2019   HGBA1C 5.4 04/08/2019   Lab Results  Component Value Date   INSULIN 11.2 12/25/2020   INSULIN 20.6 08/08/2020   INSULIN 10.4 08/16/2019   INSULIN 20.3 04/08/2019   INSULIN 13.8 11/09/2018   Lab Results  Component Value Date   TSH 1.63 10/01/2021   Lab Results  Component Value Date   CHOL 168 10/01/2021   HDL 49.80 10/01/2021   LDLCALC 105 (H) 10/01/2021   LDLDIRECT 140.5 08/12/2013   TRIG 66.0 10/01/2021  CHOLHDL 3 10/01/2021   Lab Results  Component Value Date   VD25OH 36.62 10/01/2021   VD25OH 66.09 03/23/2020   VD25OH 38.4 08/16/2019   Lab Results  Component Value Date   WBC 3.4 (L) 10/01/2021   HGB 11.9 (L) 10/01/2021   HCT 36.5 10/01/2021   MCV 86.9 10/01/2021   PLT 340.0 10/01/2021    Attestation Statements:   Reviewed by clinician on day of visit: allergies, medications, problem list, medical history,  surgical history, family history, social history, and previous encounter notes.  Coral Ceo, CMA, am acting as transcriptionist for Coralie Common, MD.   I have reviewed the above documentation for accuracy and completeness, and I agree with the above. - Coralie Common, MD

## 2021-11-20 ENCOUNTER — Other Ambulatory Visit: Payer: Self-pay

## 2021-11-20 ENCOUNTER — Ambulatory Visit (INDEPENDENT_AMBULATORY_CARE_PROVIDER_SITE_OTHER): Admitting: Family Medicine

## 2021-11-20 ENCOUNTER — Encounter (INDEPENDENT_AMBULATORY_CARE_PROVIDER_SITE_OTHER): Payer: Self-pay | Admitting: Family Medicine

## 2021-11-20 VITALS — BP 152/66 | HR 68 | Temp 98.3°F | Ht 66.0 in | Wt 189.0 lb

## 2021-11-20 DIAGNOSIS — I10 Essential (primary) hypertension: Secondary | ICD-10-CM | POA: Diagnosis not present

## 2021-11-20 DIAGNOSIS — Z6834 Body mass index (BMI) 34.0-34.9, adult: Secondary | ICD-10-CM

## 2021-11-20 DIAGNOSIS — E66811 Obesity, class 1: Secondary | ICD-10-CM

## 2021-11-20 DIAGNOSIS — Z6830 Body mass index (BMI) 30.0-30.9, adult: Secondary | ICD-10-CM

## 2021-11-20 DIAGNOSIS — E669 Obesity, unspecified: Secondary | ICD-10-CM | POA: Diagnosis not present

## 2021-11-20 DIAGNOSIS — R7303 Prediabetes: Secondary | ICD-10-CM

## 2021-11-20 MED ORDER — METFORMIN HCL 500 MG PO TABS: 500.0000 mg | ORAL_TABLET | Freq: Two times a day (BID) | ORAL | 0 refills | Status: DC

## 2021-11-20 NOTE — Progress Notes (Signed)
Chief Complaint:   OBESITY Alicia Knapp is here to discuss her progress with her obesity treatment plan along with follow-up of her obesity related diagnoses. Alicia Knapp is on the Category 2 Plan and states she is following her eating plan approximately 80% of the time. Alicia Knapp states she is doing weights and elliptical walker 20-30 minutes 2-3 times per week.  Today's visit was #: 32 Starting weight: 212 lbs Starting date: 08/03/2018 Today's weight: 189 lbs Today's date: 11/20/2021 Total lbs lost to date: 23 Total lbs lost since last in-office visit: 1  Interim History: Pt's aunts passed away since pt's last appt. She got off meal plan after aunt's passing. Pt's dog is also ill (he is 99 yo), so she is making him comfortable. She got a bit off track the 2nd week. She has no upcoming plans. Food wise, she wants to stay on track on category 2. Pt has done some of the recipes from recipe packet.  Subjective:   1. Prediabetes Pt is on Metformin twice a day. Her last A1c was better controlled.  2. Essential hypertension BP controlled previously- lots of stress recently. Pt denies chest pain/chest pressure/headache. She is on amlodipine, Coreg, HCTZ, and Cozaar.  Assessment/Plan:   1. Prediabetes Alicia Knapp will continue to work on weight loss, exercise, and decreasing simple carbohydrates to help decrease the risk of diabetes.   Refill- metFORMIN (GLUCOPHAGE) 500 MG tablet; Take 1 tablet (500 mg total) by mouth 2 (two) times daily with a meal.  Dispense: 180 tablet; Refill: 0  2. Essential hypertension Alicia Knapp is working on healthy weight loss and exercise to improve blood pressure control. We will watch for signs of hypotension as she continues her lifestyle modifications. Continue current treatment plan.  3. Obesity with current BMI of 30.6  Alicia Knapp is currently in the action stage of change. As such, her goal is to continue with weight loss efforts. She has agreed to the Category 2 Plan.   Exercise  goals: All adults should avoid inactivity. Some physical activity is better than none, and adults who participate in any amount of physical activity gain some health benefits.  Behavioral modification strategies: increasing lean protein intake, meal planning and cooking strategies, keeping healthy foods in the home, and planning for success.  Alicia Knapp has agreed to follow-up with our clinic in 2 weeks. She was informed of the importance of frequent follow-up visits to maximize her success with intensive lifestyle modifications for her multiple health conditions.   Objective:   Blood pressure (!) 152/66, pulse 68, temperature 98.3 F (36.8 C), height 5\' 6"  (1.676 m), weight 189 lb (85.7 kg), SpO2 99 %. Body mass index is 30.51 kg/m.  General: Cooperative, alert, well developed, in no acute distress. HEENT: Conjunctivae and lids unremarkable. Cardiovascular: Regular rhythm.  Lungs: Normal work of breathing. Neurologic: No focal deficits.   Lab Results  Component Value Date   CREATININE 0.79 10/01/2021   BUN 16 10/01/2021   NA 140 10/01/2021   K 4.1 10/01/2021   CL 103 10/01/2021   CO2 30 10/01/2021   Lab Results  Component Value Date   ALT 21 10/01/2021   AST 16 10/01/2021   ALKPHOS 58 10/01/2021   BILITOT 0.5 10/01/2021   Lab Results  Component Value Date   HGBA1C 5.5 10/01/2021   HGBA1C 5.3 12/25/2020   HGBA1C 5.3 08/08/2020   HGBA1C 5.1 08/16/2019   HGBA1C 5.4 04/08/2019   Lab Results  Component Value Date   INSULIN 11.2 12/25/2020  INSULIN 20.6 08/08/2020   INSULIN 10.4 08/16/2019   INSULIN 20.3 04/08/2019   INSULIN 13.8 11/09/2018   Lab Results  Component Value Date   TSH 1.63 10/01/2021   Lab Results  Component Value Date   CHOL 168 10/01/2021   HDL 49.80 10/01/2021   LDLCALC 105 (H) 10/01/2021   LDLDIRECT 140.5 08/12/2013   TRIG 66.0 10/01/2021   CHOLHDL 3 10/01/2021   Lab Results  Component Value Date   VD25OH 36.62 10/01/2021   VD25OH 66.09  03/23/2020   VD25OH 38.4 08/16/2019   Lab Results  Component Value Date   WBC 3.4 (L) 10/01/2021   HGB 11.9 (L) 10/01/2021   HCT 36.5 10/01/2021   MCV 86.9 10/01/2021   PLT 340.0 10/01/2021    Attestation Statements:   Reviewed by clinician on day of visit: allergies, medications, problem list, medical history, surgical history, family history, social history, and previous encounter notes.  Coral Ceo, CMA, am acting as transcriptionist for Coralie Common, MD.   I have reviewed the above documentation for accuracy and completeness, and I agree with the above. - Coralie Common, MD

## 2021-12-03 ENCOUNTER — Ambulatory Visit (INDEPENDENT_AMBULATORY_CARE_PROVIDER_SITE_OTHER)

## 2021-12-03 DIAGNOSIS — Z23 Encounter for immunization: Secondary | ICD-10-CM | POA: Diagnosis not present

## 2021-12-03 NOTE — Progress Notes (Signed)
Per orders of Dr. Etter Sjogren, injection of Shingrix vaccine administered IM in left deltoid by Gerilyn Nestle. Patient tolerated injection well.

## 2021-12-04 ENCOUNTER — Encounter (INDEPENDENT_AMBULATORY_CARE_PROVIDER_SITE_OTHER): Payer: Self-pay | Admitting: Family Medicine

## 2021-12-04 ENCOUNTER — Other Ambulatory Visit: Payer: Self-pay

## 2021-12-04 ENCOUNTER — Ambulatory Visit (INDEPENDENT_AMBULATORY_CARE_PROVIDER_SITE_OTHER): Admitting: Family Medicine

## 2021-12-04 VITALS — BP 137/75 | HR 62 | Temp 98.1°F | Ht 66.0 in | Wt 187.0 lb

## 2021-12-04 DIAGNOSIS — E669 Obesity, unspecified: Secondary | ICD-10-CM

## 2021-12-04 DIAGNOSIS — Z6834 Body mass index (BMI) 34.0-34.9, adult: Secondary | ICD-10-CM

## 2021-12-04 DIAGNOSIS — I10 Essential (primary) hypertension: Secondary | ICD-10-CM

## 2021-12-04 DIAGNOSIS — Z6830 Body mass index (BMI) 30.0-30.9, adult: Secondary | ICD-10-CM | POA: Diagnosis not present

## 2021-12-05 NOTE — Progress Notes (Signed)
Chief Complaint:   OBESITY Alicia Knapp is here to discuss her progress with her obesity treatment plan along with follow-up of her obesity related diagnoses. Alicia Knapp is on the Category 2 Plan and states she is following her eating plan approximately 90-95% of the time. Alicia Knapp states she is weight training and elliptical 30 minutes 2-3 times per week.  Today's visit was #: 68 Starting weight: 212 lbs Starting date: 08/03/2018 Today's weight: 187 lbs Today's date: 12/04/2021 Total lbs lost to date: 25 Total lbs lost since last in-office visit: 2  Interim History: Pt has tried recipes over the last few weeks. She and her husband are really enjoyed getting to try different recipes. She stopped doing resistance bands but is still doing resistance with weights. The next few weeks, she has her birthday, Valentine's Day, and Super Bowl.  Subjective:   1. Essential hypertension BP well controlled- previously has been labile. Pt denies chest pain/chest pressure/headache.  Assessment/Plan:   1. Essential hypertension Alicia Knapp is working on healthy weight loss and exercise to improve blood pressure control. We will watch for signs of hypotension as she continues her lifestyle modifications. Continue current meds with no change in meds or dose.  2. Obesity with current BMI of 30.3 Alicia Knapp is currently in the action stage of change. As such, her goal is to continue with weight loss efforts. She has agreed to the Category 2 Plan.   Exercise goals:  As is  Behavioral modification strategies: increasing lean protein intake, meal planning and cooking strategies, and keeping healthy foods in the home.  Alicia Knapp has agreed to follow-up with our clinic in 2-3 weeks. She was informed of the importance of frequent follow-up visits to maximize her success with intensive lifestyle modifications for her multiple health conditions.   Objective:   Blood pressure 137/75, pulse 62, temperature 98.1 F (36.7 C), height 5\' 6"   (1.676 m), weight 187 lb (84.8 kg), SpO2 100 %. Body mass index is 30.18 kg/m.  General: Cooperative, alert, well developed, in no acute distress. HEENT: Conjunctivae and lids unremarkable. Cardiovascular: Regular rhythm.  Lungs: Normal work of breathing. Neurologic: No focal deficits.   Lab Results  Component Value Date   CREATININE 0.79 10/01/2021   BUN 16 10/01/2021   NA 140 10/01/2021   K 4.1 10/01/2021   CL 103 10/01/2021   CO2 30 10/01/2021   Lab Results  Component Value Date   ALT 21 10/01/2021   AST 16 10/01/2021   ALKPHOS 58 10/01/2021   BILITOT 0.5 10/01/2021   Lab Results  Component Value Date   HGBA1C 5.5 10/01/2021   HGBA1C 5.3 12/25/2020   HGBA1C 5.3 08/08/2020   HGBA1C 5.1 08/16/2019   HGBA1C 5.4 04/08/2019   Lab Results  Component Value Date   INSULIN 11.2 12/25/2020   INSULIN 20.6 08/08/2020   INSULIN 10.4 08/16/2019   INSULIN 20.3 04/08/2019   INSULIN 13.8 11/09/2018   Lab Results  Component Value Date   TSH 1.63 10/01/2021   Lab Results  Component Value Date   CHOL 168 10/01/2021   HDL 49.80 10/01/2021   LDLCALC 105 (H) 10/01/2021   LDLDIRECT 140.5 08/12/2013   TRIG 66.0 10/01/2021   CHOLHDL 3 10/01/2021   Lab Results  Component Value Date   VD25OH 36.62 10/01/2021   VD25OH 66.09 03/23/2020   VD25OH 38.4 08/16/2019   Lab Results  Component Value Date   WBC 3.4 (L) 10/01/2021   HGB 11.9 (L) 10/01/2021   HCT 36.5  10/01/2021   MCV 86.9 10/01/2021   PLT 340.0 10/01/2021   Attestation Statements:   Reviewed by clinician on day of visit: allergies, medications, problem list, medical history, surgical history, family history, social history, and previous encounter notes.  Coral Ceo, CMA, am acting as transcriptionist for Coralie Common, MD.   I have reviewed the above documentation for accuracy and completeness, and I agree with the above. - Coralie Common, MD

## 2021-12-18 ENCOUNTER — Encounter (INDEPENDENT_AMBULATORY_CARE_PROVIDER_SITE_OTHER): Payer: Self-pay | Admitting: Family Medicine

## 2021-12-18 ENCOUNTER — Other Ambulatory Visit: Payer: Self-pay

## 2021-12-18 ENCOUNTER — Ambulatory Visit (INDEPENDENT_AMBULATORY_CARE_PROVIDER_SITE_OTHER): Admitting: Family Medicine

## 2021-12-18 VITALS — BP 146/72 | HR 63 | Temp 98.5°F | Ht 66.0 in | Wt 188.0 lb

## 2021-12-18 DIAGNOSIS — Z6830 Body mass index (BMI) 30.0-30.9, adult: Secondary | ICD-10-CM | POA: Diagnosis not present

## 2021-12-18 DIAGNOSIS — E669 Obesity, unspecified: Secondary | ICD-10-CM | POA: Diagnosis not present

## 2021-12-18 DIAGNOSIS — Z6834 Body mass index (BMI) 34.0-34.9, adult: Secondary | ICD-10-CM

## 2021-12-18 DIAGNOSIS — I10 Essential (primary) hypertension: Secondary | ICD-10-CM

## 2021-12-18 NOTE — Progress Notes (Signed)
Chief Complaint:   OBESITY Alicia Knapp is here to discuss her progress with her obesity treatment plan along with follow-up of her obesity related diagnoses. [name] is on the Category 2 Plan and states she is following her eating plan approximately 85-90% of the time. [name] states she is strength training and elliptical 30 minutes 2-3 times per week.  Today's visit was #: 81 Starting weight: 212 lbs Starting date: 08/03/2018 Today's weight: 188 lbs Today's date: 12/18/2021 Total lbs lost to date: 24 Total lbs lost since last in-office visit: 0  Interim History: Pt had Super Bowl festivities and celebrated her birthday with a murder mystery dinner. She has no plans for the next few weeks, except setting up her garden. She wants to really get back on track with eating on plan and also setting up her garden.  Subjective:   1. Essential hypertension BP minimally elevated today. Rache denies chest pain/chest pressure/headache. She is on amlodipine, Coreg, HCTZ, and Cozaar.  Assessment/Plan:   1. Essential hypertension [name] is working on healthy weight loss and exercise to improve blood pressure control. We will watch for signs of hypotension as she continues her lifestyle modifications. Continue current plan with no change in dose.  2. Obesity with current BMI of 30.4 Teva is currently in the action stage of change. As such, her goal is to continue with weight loss efforts. She has agreed to the Category 2 Plan.   Exercise goals:  As is  Behavioral modification strategies: increasing lean protein intake, meal planning and cooking strategies, keeping healthy foods in the home, and planning for success.  [name] has agreed to follow-up with our clinic in 3 weeks. She was informed of the importance of frequent follow-up visits to maximize her success with intensive lifestyle modifications for her multiple health conditions.   Objective:   Blood pressure (!) 146/72, pulse 63, temperature 98.5 F  (36.9 C), height 5\' 6"  (1.676 m), weight 188 lb (85.3 kg), SpO2 99 %. Body mass index is 30.34 kg/m.  General: Cooperative, alert, well developed, in no acute distress. HEENT: Conjunctivae and lids unremarkable. Cardiovascular: Regular rhythm.  Lungs: Normal work of breathing. Neurologic: No focal deficits.   Lab Results  Component Value Date   CREATININE 0.79 10/01/2021   BUN 16 10/01/2021   NA 140 10/01/2021   K 4.1 10/01/2021   CL 103 10/01/2021   CO2 30 10/01/2021   Lab Results  Component Value Date   ALT 21 10/01/2021   AST 16 10/01/2021   ALKPHOS 58 10/01/2021   BILITOT 0.5 10/01/2021   Lab Results  Component Value Date   HGBA1C 5.5 10/01/2021   HGBA1C 5.3 12/25/2020   HGBA1C 5.3 08/08/2020   HGBA1C 5.1 08/16/2019   HGBA1C 5.4 04/08/2019   Lab Results  Component Value Date   INSULIN 11.2 12/25/2020   INSULIN 20.6 08/08/2020   INSULIN 10.4 08/16/2019   INSULIN 20.3 04/08/2019   INSULIN 13.8 11/09/2018   Lab Results  Component Value Date   TSH 1.63 10/01/2021   Lab Results  Component Value Date   CHOL 168 10/01/2021   HDL 49.80 10/01/2021   LDLCALC 105 (H) 10/01/2021   LDLDIRECT 140.5 08/12/2013   TRIG 66.0 10/01/2021   CHOLHDL 3 10/01/2021   Lab Results  Component Value Date   VD25OH 36.62 10/01/2021   VD25OH 66.09 03/23/2020   VD25OH 38.4 08/16/2019   Lab Results  Component Value Date   WBC 3.4 (L) 10/01/2021   HGB  11.9 (L) 10/01/2021   HCT 36.5 10/01/2021   MCV 86.9 10/01/2021   PLT 340.0 10/01/2021    Attestation Statements:   Reviewed by clinician on day of visit: allergies, medications, problem list, medical history, surgical history, family history, social history, and previous encounter notes.  Coral Ceo, CMA, am acting as transcriptionist for Coralie Common, MD.   I have reviewed the above documentation for accuracy and completeness, and I agree with the above. - Coralie Common, MD

## 2022-01-06 ENCOUNTER — Ambulatory Visit (INDEPENDENT_AMBULATORY_CARE_PROVIDER_SITE_OTHER): Admitting: Family Medicine

## 2022-01-06 ENCOUNTER — Other Ambulatory Visit: Payer: Self-pay

## 2022-01-06 ENCOUNTER — Encounter (INDEPENDENT_AMBULATORY_CARE_PROVIDER_SITE_OTHER): Payer: Self-pay | Admitting: Family Medicine

## 2022-01-06 VITALS — BP 123/71 | HR 68 | Temp 98.5°F | Ht 66.0 in | Wt 187.0 lb

## 2022-01-06 DIAGNOSIS — E669 Obesity, unspecified: Secondary | ICD-10-CM | POA: Diagnosis not present

## 2022-01-06 DIAGNOSIS — E7849 Other hyperlipidemia: Secondary | ICD-10-CM

## 2022-01-06 DIAGNOSIS — I10 Essential (primary) hypertension: Secondary | ICD-10-CM | POA: Diagnosis not present

## 2022-01-06 DIAGNOSIS — Z6830 Body mass index (BMI) 30.0-30.9, adult: Secondary | ICD-10-CM

## 2022-01-07 NOTE — Progress Notes (Unsigned)
Chief Complaint:   OBESITY Alicia Knapp is here to discuss her progress with her obesity treatment plan along with follow-up of her obesity related diagnoses. Alicia Knapp is on the Category 2 Plan and states she is following her eating plan approximately 85% of the time. Alicia Knapp states she is doing strength training for 30 minutes 3 times per week and the elliptical walker for 30 minutes 2 times per week.  Today's visit was #: 47 Starting weight: 212 lbs Starting date: 08/03/18 Today's weight: 187 lbs Today's date: 01/06/2022 Total lbs lost to date: 25 Total lbs lost since last in-office visit: 1  Interim History: Alicia Knapp has been focusing on her garden and prepping to make sure that its ready. She is following the meal plan 85%. Alicia Knapp hasn't eaten out, but did overindulge with snacks. She also slacked off on the vegetable amount. For the next few weeks, Alicia Knapp only has gardening planned. Her goal is to get to 45 minutes on the elliptical.  Subjective:   1. Essential hypertension Alicia Knapp's blood pressure is well controlled. She denies chest pain, chest pressure, or headache.  2. Other hyperlipidemia Alicia Knapp is on Lipitor. She denies transaminitises or myalgias. Medication(s) reviewed.   Lab Results  Component Value Date   CHOL 168 10/01/2021   HDL 49.80 10/01/2021   LDLCALC 105 (H) 10/01/2021   LDLDIRECT 140.5 08/12/2013   TRIG 66.0 10/01/2021   CHOLHDL 3 10/01/2021   Lab Results  Component Value Date   ALT 21 10/01/2021   AST 16 10/01/2021   ALKPHOS 58 10/01/2021   BILITOT 0.5 10/01/2021   The 10-year ASCVD risk score (Arnett DK, et al., 2019) is: 6.9%   Values used to calculate the score:     Age: 66 years     Sex: Female     Is Non-Hispanic African American: Yes     Diabetic: No     Tobacco smoker: No     Systolic Blood Pressure: 212 mmHg     Is BP treated: Yes     HDL Cholesterol: 49.8 mg/dL     Total Cholesterol: 168 mg/dL    Assessment/Plan:   1. Essential hypertension Alicia Knapp agrees  to continue taking Cozaar, HCTZ, and Coreg. She is working on healthy weight loss and exercise to improve blood pressure control. We will watch for signs of hypotension as she continues her lifestyle modifications.  2. Other hyperlipidemia Alicia Knapp agrees to continue Lipitor. Her last labs were in November and we will repeat labs in May. Cardiovascular risk and specific lipid/LDL goals reviewed.  We discussed several lifestyle modifications today and Alicia Knapp will continue to work on diet, exercise and weight loss efforts. Orders and follow up as documented in patient record.   Counseling Intensive lifestyle modifications are the first line treatment for this issue. Dietary changes: Increase soluble fiber. Decrease simple carbohydrates. Exercise changes: Moderate to vigorous-intensity aerobic activity 150 minutes per week if tolerated. Lipid-lowering medications: see documented in medical record.  3. Obesity with current BMI of 30.3  Alicia Knapp is currently in the action stage of change. As such, her goal is to continue with weight loss efforts. She has agreed to the Category 2 Plan.   Exercise goals: As is.  Behavioral modification strategies: increasing lean protein intake, meal planning and cooking strategies, keeping healthy foods in the home, and planning for success.  Alicia Knapp has agreed to follow-up with our clinic in 3 weeks. She was informed of the importance of frequent follow-up visits to maximize her  success with intensive lifestyle modifications for her multiple health conditions.   Objective:   Blood pressure 123/71, pulse 68, temperature 98.5 F (36.9 C), height '5\' 6"'$  (1.676 m), weight 187 lb (84.8 kg), SpO2 100 %. Body mass index is 30.18 kg/m.  General: Cooperative, alert, well developed, in no acute distress. HEENT: Conjunctivae and lids unremarkable. Cardiovascular: Regular rhythm.  Lungs: Normal work of breathing. Neurologic: No focal deficits.   Lab Results  Component Value  Date   CREATININE 0.79 10/01/2021   BUN 16 10/01/2021   NA 140 10/01/2021   K 4.1 10/01/2021   CL 103 10/01/2021   CO2 30 10/01/2021   Lab Results  Component Value Date   ALT 21 10/01/2021   AST 16 10/01/2021   ALKPHOS 58 10/01/2021   BILITOT 0.5 10/01/2021   Lab Results  Component Value Date   HGBA1C 5.5 10/01/2021   HGBA1C 5.3 12/25/2020   HGBA1C 5.3 08/08/2020   HGBA1C 5.1 08/16/2019   HGBA1C 5.4 04/08/2019   Lab Results  Component Value Date   INSULIN 11.2 12/25/2020   INSULIN 20.6 08/08/2020   INSULIN 10.4 08/16/2019   INSULIN 20.3 04/08/2019   INSULIN 13.8 11/09/2018   Lab Results  Component Value Date   TSH 1.63 10/01/2021   Lab Results  Component Value Date   CHOL 168 10/01/2021   HDL 49.80 10/01/2021   LDLCALC 105 (H) 10/01/2021   LDLDIRECT 140.5 08/12/2013   TRIG 66.0 10/01/2021   CHOLHDL 3 10/01/2021   Lab Results  Component Value Date   VD25OH 36.62 10/01/2021   VD25OH 66.09 03/23/2020   VD25OH 38.4 08/16/2019   Lab Results  Component Value Date   WBC 3.4 (L) 10/01/2021   HGB 11.9 (L) 10/01/2021   HCT 36.5 10/01/2021   MCV 86.9 10/01/2021   PLT 340.0 10/01/2021   No results found for: IRON, TIBC, FERRITIN  Attestation Statements:   Reviewed by clinician on day of visit: allergies, medications, problem list, medical history, surgical history, family history, social history, and previous encounter notes.  IMarcille Blanco, CMA, am acting as transcriptionist for Coralie Common, MD   I have reviewed the above documentation for accuracy and completeness, and I agree with the above. -  ***

## 2022-01-27 ENCOUNTER — Ambulatory Visit (INDEPENDENT_AMBULATORY_CARE_PROVIDER_SITE_OTHER): Admitting: Family Medicine

## 2022-01-27 ENCOUNTER — Encounter (INDEPENDENT_AMBULATORY_CARE_PROVIDER_SITE_OTHER): Payer: Self-pay | Admitting: Family Medicine

## 2022-01-27 ENCOUNTER — Other Ambulatory Visit: Payer: Self-pay

## 2022-01-27 VITALS — BP 131/74 | HR 67 | Temp 98.3°F | Ht 66.0 in | Wt 189.0 lb

## 2022-01-27 DIAGNOSIS — E669 Obesity, unspecified: Secondary | ICD-10-CM

## 2022-01-27 DIAGNOSIS — E7849 Other hyperlipidemia: Secondary | ICD-10-CM | POA: Diagnosis not present

## 2022-01-27 DIAGNOSIS — I10 Essential (primary) hypertension: Secondary | ICD-10-CM

## 2022-01-27 DIAGNOSIS — Z6830 Body mass index (BMI) 30.0-30.9, adult: Secondary | ICD-10-CM | POA: Diagnosis not present

## 2022-01-29 NOTE — Progress Notes (Signed)
? ? ? ?Chief Complaint:  ? ?OBESITY ?Alicia Knapp is here to discuss her progress with her obesity treatment plan along with follow-up of her obesity related diagnoses. Alicia Knapp is on the Category 2 Plan and states she is following her eating plan approximately 75% of the time. Alicia Knapp states she is doing exercises, strengthening, and on the elliptical for 30-38 minutes 2-3 times per week. ? ?Today's visit was #: 39 ?Starting weight: 212 lbs ?Starting date: 08/03/2018 ?Today's weight: 189 lbs ?Today's date: 01/27/2022 ?Total lbs lost to date: 63 ?Total lbs lost since last in-office visit: 0 ? ?Interim History: Alicia Knapp has been eating out more due to demands on gardening. She recognizes that she then did have a slightly higher intake of carbs in the last few weeks. She is planning on going to [employer] prior to her next appointment. ? ?Subjective:  ? ?1. Essential hypertension ?Alicia Knapp's blood pressure is well controlled today. She os on amlodipine 10 mg, hydrochlorothiazide 25 mg, and Coreg 6.25 mg BID.  ? ?2. Other hyperlipidemia ?Alicia Knapp is on Lipitor with no myalgias. Last labs were done in November 2022. ? ?Assessment/Plan:  ? ?1. Essential hypertension ?Alicia Knapp will continue her current medications, with no change in doses. ? ?2. Other hyperlipidemia ?Alicia Knapp will continue Lipitor, with no change in dose or medications. ? ?3. Obesity with current BMI of 30.5 ?Alicia Knapp is currently in the action stage of change. As such, her goal is to continue with weight loss efforts. She has agreed to the Category 2 Plan.  ? ?Exercise goals: As is. ? ?Behavioral modification strategies: increasing lean protein intake, meal planning and cooking strategies, and keeping healthy foods in the home. ? ?Alicia Knapp has agreed to follow-up with our clinic in 2 weeks. She was informed of the importance of frequent follow-up visits to maximize her success with intensive lifestyle modifications for her multiple health conditions.  ? ?Objective:  ? ?Blood pressure 131/74, pulse  67, temperature 98.3 ?F (36.8 ?C), height '5\' 6"'$  (1.676 m), weight 189 lb (85.7 kg), SpO2 100 %. ?Body mass index is 30.51 kg/m?. ? ?General: Cooperative, alert, well developed, in no acute distress. ?HEENT: Conjunctivae and lids unremarkable. ?Cardiovascular: Regular rhythm.  ?Lungs: Normal work of breathing. ?Neurologic: No focal deficits.  ? ?Lab Results  ?Component Value Date  ? CREATININE 0.79 10/01/2021  ? BUN 16 10/01/2021  ? NA 140 10/01/2021  ? K 4.1 10/01/2021  ? CL 103 10/01/2021  ? CO2 30 10/01/2021  ? ?Lab Results  ?Component Value Date  ? ALT 21 10/01/2021  ? AST 16 10/01/2021  ? ALKPHOS 58 10/01/2021  ? BILITOT 0.5 10/01/2021  ? ?Lab Results  ?Component Value Date  ? HGBA1C 5.5 10/01/2021  ? HGBA1C 5.3 12/25/2020  ? HGBA1C 5.3 08/08/2020  ? HGBA1C 5.1 08/16/2019  ? HGBA1C 5.4 04/08/2019  ? ?Lab Results  ?Component Value Date  ? INSULIN 11.2 12/25/2020  ? INSULIN 20.6 08/08/2020  ? INSULIN 10.4 08/16/2019  ? INSULIN 20.3 04/08/2019  ? INSULIN 13.8 11/09/2018  ? ?Lab Results  ?Component Value Date  ? TSH 1.63 10/01/2021  ? ?Lab Results  ?Component Value Date  ? CHOL 168 10/01/2021  ? HDL 49.80 10/01/2021  ? Jermyn 105 (H) 10/01/2021  ? LDLDIRECT 140.5 08/12/2013  ? TRIG 66.0 10/01/2021  ? CHOLHDL 3 10/01/2021  ? ?Lab Results  ?Component Value Date  ? VD25OH 36.62 10/01/2021  ? VD25OH 66.09 03/23/2020  ? VD25OH 38.4 08/16/2019  ? ?Lab Results  ?Component Value Date  ?  WBC 3.4 (L) 10/01/2021  ? HGB 11.9 (L) 10/01/2021  ? HCT 36.5 10/01/2021  ? MCV 86.9 10/01/2021  ? PLT 340.0 10/01/2021  ? ?No results found for: IRON, TIBC, FERRITIN ? ?Attestation Statements:  ? ?Reviewed by clinician on day of visit: allergies, medications, problem list, medical history, surgical history, family history, social history, and previous encounter notes. ? ? ?I, Trixie Dredge, am acting as transcriptionist for Coralie Common, MD. ?I have reviewed the above documentation for accuracy and completeness, and I agree with the  above. Coralie Common, MD ? ? ?

## 2022-02-10 ENCOUNTER — Ambulatory Visit (INDEPENDENT_AMBULATORY_CARE_PROVIDER_SITE_OTHER): Admitting: Family Medicine

## 2022-02-24 ENCOUNTER — Ambulatory Visit (INDEPENDENT_AMBULATORY_CARE_PROVIDER_SITE_OTHER): Admitting: Family Medicine

## 2022-02-24 ENCOUNTER — Encounter (INDEPENDENT_AMBULATORY_CARE_PROVIDER_SITE_OTHER): Payer: Self-pay | Admitting: Family Medicine

## 2022-02-24 VITALS — BP 123/71 | HR 69 | Temp 98.5°F | Ht 66.0 in | Wt 186.0 lb

## 2022-02-24 DIAGNOSIS — Z9189 Other specified personal risk factors, not elsewhere classified: Secondary | ICD-10-CM

## 2022-02-24 DIAGNOSIS — R7303 Prediabetes: Secondary | ICD-10-CM

## 2022-02-24 DIAGNOSIS — E669 Obesity, unspecified: Secondary | ICD-10-CM

## 2022-02-24 DIAGNOSIS — Z6830 Body mass index (BMI) 30.0-30.9, adult: Secondary | ICD-10-CM

## 2022-02-24 DIAGNOSIS — I10 Essential (primary) hypertension: Secondary | ICD-10-CM | POA: Diagnosis not present

## 2022-02-24 DIAGNOSIS — Z6834 Body mass index (BMI) 34.0-34.9, adult: Secondary | ICD-10-CM

## 2022-02-24 MED ORDER — METFORMIN HCL 500 MG PO TABS: 500.0000 mg | ORAL_TABLET | Freq: Two times a day (BID) | ORAL | 0 refills | Status: DC

## 2022-03-06 DIAGNOSIS — Z9189 Other specified personal risk factors, not elsewhere classified: Secondary | ICD-10-CM | POA: Insufficient documentation

## 2022-03-06 DIAGNOSIS — R7303 Prediabetes: Secondary | ICD-10-CM | POA: Insufficient documentation

## 2022-03-06 NOTE — Progress Notes (Signed)
Chief Complaint:   OBESITY Alicia Knapp is here to discuss her progress with her obesity treatment plan along with follow-up of her obesity related diagnoses. Alicia Knapp is on the Category 2 Plan and states she is following her eating plan approximately 80% of the time. Alicia Knapp states she is exercising doing strength training, yoga and the elliptical 30 minutes 5 times per week.  Today's visit was #: 61 Starting weight: 212 lbs Starting date: 08/03/2018 Today's weight: 186 lbs Today's date: 02/24/2022 Total lbs lost to date: 26 Total lbs lost since last in-office visit: 3  Interim History: Alicia Knapp went to [place] and tried some of the chicken that is already prepared.  She has been following the eating out handout that was given at the last appointment.  She has been doing a significant amount of gardening.  In the next few weeks Alicia Knapp will continue to follow the same plan as the previous few weeks.  May 21-25 she is going to [place] to see her family.  Subjective:   1. Prediabetes Alicia Knapp's last A1c was 5.5, Insulin was 9.3. Alicia Knapp denies any GI side effects.  2. Essential hypertension Alicia Knapp's blood pressure is well controlled today.  Alicia Knapp denies any chest pain, chest pressure or headaches.   3. At risk for diarrhea Alicia Knapp is at higher risk of diarrhea due to medications and diet, due to metformin and occasional increase in carb intake.   Assessment/Plan:   1. Prediabetes Alicia Knapp has agreed to continue Metformin, we will refill today.  See below.  - metFORMIN (GLUCOPHAGE) 500 MG tablet; Take 1 tablet (500 mg total) by mouth 2 (two) times daily with a meal.  Dispense: 180 tablet; Refill: 0  2. Essential hypertension Alicia Knapp has agreed to continue current medication regimen, no change in her dose today.   3. At risk for diarrhea Alicia Knapp was given approximately 15 minutes of diarrhea prevention counseling today. She is 66 y.o. female and has risk factors for diarrhea including medications and changes in diet. We  discussed intensive lifestyle modifications today with an emphasis on specific weight loss instructions including dietary strategies.   Repetitive spaced learning was employed today to elicit superior memory formation and behavioral change.   4. Obesity with current BMI of 30.1 Alicia Knapp is currently in the action stage of change. As such, her goal is to continue with weight loss efforts. She has agreed to the Category 2 Plan.   Exercise goals: All adults should avoid inactivity. Some physical activity is better than none, and adults who participate in any amount of physical activity gain some health benefits.  Behavioral modification strategies: increasing lean protein intake, meal planning and cooking strategies, keeping healthy foods in the home, and planning for success.  Alicia Knapp has agreed to follow-up with our clinic in 3 weeks. She was informed of the importance of frequent follow-up visits to maximize her success with intensive lifestyle modifications for her multiple health conditions.  Objective:   Blood pressure 123/71, pulse 69, temperature 98.5 F (36.9 C), height '5\' 6"'$  (1.676 m), weight 186 lb (84.4 kg), SpO2 98 %. Body mass index is 30.02 kg/m.  General: Cooperative, alert, well developed, in no acute distress. HEENT: Conjunctivae and lids unremarkable. Cardiovascular: Regular rhythm.  Lungs: Normal work of breathing. Neurologic: No focal deficits.   Lab Results  Component Value Date   CREATININE 0.79 10/01/2021   BUN 16 10/01/2021   NA 140 10/01/2021   K 4.1 10/01/2021   CL 103 10/01/2021   CO2  30 10/01/2021   Lab Results  Component Value Date   ALT 21 10/01/2021   AST 16 10/01/2021   ALKPHOS 58 10/01/2021   BILITOT 0.5 10/01/2021   Lab Results  Component Value Date   HGBA1C 5.5 10/01/2021   HGBA1C 5.3 12/25/2020   HGBA1C 5.3 08/08/2020   HGBA1C 5.1 08/16/2019   HGBA1C 5.4 04/08/2019   Lab Results  Component Value Date   INSULIN 11.2 12/25/2020   INSULIN  20.6 08/08/2020   INSULIN 10.4 08/16/2019   INSULIN 20.3 04/08/2019   INSULIN 13.8 11/09/2018   Lab Results  Component Value Date   TSH 1.63 10/01/2021   Lab Results  Component Value Date   CHOL 168 10/01/2021   HDL 49.80 10/01/2021   LDLCALC 105 (H) 10/01/2021   LDLDIRECT 140.5 08/12/2013   TRIG 66.0 10/01/2021   CHOLHDL 3 10/01/2021   Lab Results  Component Value Date   VD25OH 36.62 10/01/2021   VD25OH 66.09 03/23/2020   VD25OH 38.4 08/16/2019   Lab Results  Component Value Date   WBC 3.4 (L) 10/01/2021   HGB 11.9 (L) 10/01/2021   HCT 36.5 10/01/2021   MCV 86.9 10/01/2021   PLT 340.0 10/01/2021   No results found for: IRON, TIBC, FERRITIN  Attestation Statements:   Reviewed by clinician on day of visit: allergies, medications, problem list, medical history, surgical history, family history, social history, and previous encounter notes.  I, Davy Pique, am acting as transcriptionist for Coralie Common, MD.  I have reviewed the above documentation for accuracy and completeness, and I agree with the above. - Coralie Common, MD

## 2022-03-11 ENCOUNTER — Encounter: Payer: Self-pay | Admitting: Family Medicine

## 2022-03-11 ENCOUNTER — Telehealth (INDEPENDENT_AMBULATORY_CARE_PROVIDER_SITE_OTHER): Admitting: Family Medicine

## 2022-03-11 DIAGNOSIS — U071 COVID-19: Secondary | ICD-10-CM | POA: Diagnosis not present

## 2022-03-11 MED ORDER — BENZONATATE 100 MG PO CAPS: 200.0000 mg | ORAL_CAPSULE | Freq: Three times a day (TID) | ORAL | 0 refills | Status: DC | PRN

## 2022-03-11 MED ORDER — NIRMATRELVIR/RITONAVIR (PAXLOVID)TABLET: 3.0000 | ORAL_TABLET | Freq: Two times a day (BID) | ORAL | 0 refills | Status: AC

## 2022-03-11 NOTE — Progress Notes (Signed)
MyChart Video Visit    Virtual Visit via Video Note   This visit type was conducted due to national recommendations for restrictions regarding the COVID-19 Pandemic (e.g. social distancing) in an effort to limit this patient's exposure and mitigate transmission in our community. This patient is at least at moderate risk for complications without adequate follow up. This format is felt to be most appropriate for this patient at this time. Physical exam was limited by quality of the video and audio technology used for the visit. Alicia Knapp was able to get the patient set up on a video visit.  Patient location: Home Patient and provider in visit Provider location: Office  I discussed the limitations of evaluation and management by telemedicine and the availability of in person appointments. The patient expressed understanding and agreed to proceed.  Visit Date: 03/11/2022  Today's healthcare provider: Donato Schultz, DO     Subjective:    Patient ID: Alicia Knapp, female    DOB: 1956/05/10, 66 y.o.   MRN: [id number]  Chief Complaint  Patient presents with  . Covid Positive    HPI Patient is in today for a video visit.   She complains of headaches, light chills, mild congestion, fatigue, cough, fever since yesterday. She tested positive for Covid-19 yesterday. She has sputum production with her cough. She reports her symptoms have mildly improved since yesterday. She is taking halls sugar free cough drops, otherwise she is not taking anything else at this time. She reports her husband had an organ transplant procedure in the last 6 years and she is inquiring if she needs to isolate herself from him. She does not remember if she owns a oxygen measuring device at home.  She has not checked her blood pressure at home. She reports during her last appointment with a healthy weight and wellness clinic a couple of weeks ago her blood pressure was reading well.  BP Readings  from Last 3 Encounters:  02/24/22 123/71  01/27/22 131/74  01/06/22 123/71   Pulse Readings from Last 3 Encounters:  02/24/22 69  01/27/22 67  01/06/22 68    Past Medical History:  Diagnosis Date  . Anxiety   . B12 deficiency   . Constipation   . Heart murmur   . Hyperlipidemia   . Hypertension   . Joint pain   . Postoperative nausea   . Prediabetes   . Swelling    lower ext  . Vitamin D deficiency     Past Surgical History:  Procedure Laterality Date  . ABDOMINAL HYSTERECTOMY    . APPENDECTOMY    . COLONOSCOPY  05/22/2015   Dr.Pyrtle  . POLYPECTOMY      Family History  Problem Relation Age of Onset  . Cancer Father        prostate  . Prostate cancer Father   . Diabetes Mother   . Thyroid disease Mother   . Hypertension Mother   . Hyperlipidemia Mother   . Kidney disease Mother   . Cancer Mother   . Sleep apnea Mother   . Obesity Mother   . Hypertension Sister   . Hypertension Brother   . Stroke Brother   . Colon cancer Brother 51       died 50 - 4-5 months after dx  . Arthritis Maternal Grandmother   . Esophageal cancer Neg Hx   . Rectal cancer Neg Hx   . Stomach cancer Neg Hx     Social  History   Socioeconomic History  . Marital status: Married    Spouse name: Marnette Burgess  . Number of children: Not on file  . Years of education: Not on file  . Highest education level: Not on file  Occupational History  . Occupation: Stay at home spouse  Tobacco Use  . Smoking status: Former    Types: Cigarettes    Quit date: 09/17/2014    Years since quitting: 7.4  . Smokeless tobacco: Never  Vaping Use  . Vaping Use: Never used  Substance and Sexual Activity  . Alcohol use: No    Alcohol/week: 0.0 standard drinks  . Drug use: No  . Sexual activity: Yes    Partners: Male  Other Topics Concern  . Not on file  Social History Narrative  . Not on file   Social Determinants of Health   Financial Resource Strain: Not on file  Food Insecurity: Not  on file  Transportation Needs: Not on file  Physical Activity: Not on file  Stress: Not on file  Social Connections: Not on file  Intimate Partner Violence: Not on file    Outpatient Medications Prior to Visit  Medication Sig Dispense Refill  . amLODipine (NORVASC) 10 MG tablet Take 1 tablet (10 mg total) by mouth daily. 90 tablet 3  . ARIPiprazole (ABILIFY) 2 MG tablet Take 2 mg by mouth daily.     . ARIPiprazole (ABILIFY) 5 MG tablet Take 1 tablet by mouth daily.  0  . atorvastatin (LIPITOR) 10 MG tablet Take 1 tablet (10 mg total) by mouth daily. 90 tablet 3  . carvedilol (COREG) 6.25 MG tablet Take 1 tablet (6.25 mg total) by mouth 2 (two) times daily with a meal. 180 tablet 3  . fluticasone (FLONASE) 50 MCG/ACT nasal spray Place 2 sprays into both nostrils daily. 48 g 1  . hydrochlorothiazide (HYDRODIURIL) 25 MG tablet Take 1 tablet (25 mg total) by mouth daily. 90 tablet 3  . levocetirizine (XYZAL) 5 MG tablet Take 1 tablet (5 mg total) by mouth every evening. 90 tablet 3  . losartan (COZAAR) 50 MG tablet Take 1 tablet (50 mg total) by mouth daily. 90 tablet 3  . metFORMIN (GLUCOPHAGE) 500 MG tablet Take 1 tablet (500 mg total) by mouth 2 (two) times daily with a meal. 180 tablet 0  . triamcinolone cream (KENALOG) 0.1 % Apply 1 application topically 2 (two) times daily. 30 g 0   No facility-administered medications prior to visit.    Allergies  Allergen Reactions  . Erythromycin     Causes stomach cramps  . Penicillins     rash    Review of Systems  Constitutional:  Positive for chills (mild), fever and malaise/fatigue.  HENT:  Positive for congestion (mild).   Respiratory:  Positive for cough and shortness of breath.       Objective:    Physical Exam Vitals and nursing note reviewed.  Constitutional:      Appearance: She is well-developed.  HENT:     Head: Normocephalic and atraumatic.  Eyes:     Conjunctiva/sclera: Conjunctivae normal.  Neck:     Thyroid: No  thyromegaly.     Vascular: No carotid bruit or JVD.  Cardiovascular:     Rate and Rhythm: Normal rate and regular rhythm.     Heart sounds: Normal heart sounds. No murmur heard. Pulmonary:     Effort: Pulmonary effort is normal. No respiratory distress.     Breath sounds: Normal breath sounds.  No wheezing or rales.  Chest:     Chest wall: No tenderness.  Musculoskeletal:     Cervical back: Normal range of motion and neck supple.  Neurological:     Mental Status: She is alert and oriented to person, place, and time.   There were no vitals taken for this visit. Wt Readings from Last 3 Encounters:  02/24/22 186 lb (84.4 kg)  01/27/22 189 lb (85.7 kg)  01/06/22 187 lb (84.8 kg)    Diabetic Foot Exam - Simple   No data filed    Lab Results  Component Value Date   WBC 3.4 (L) 10/01/2021   HGB 11.9 (L) 10/01/2021   HCT 36.5 10/01/2021   PLT 340.0 10/01/2021   GLUCOSE 97 10/01/2021   CHOL 168 10/01/2021   TRIG 66.0 10/01/2021   HDL 49.80 10/01/2021   LDLDIRECT 140.5 08/12/2013   LDLCALC 105 (H) 10/01/2021   ALT 21 10/01/2021   AST 16 10/01/2021   NA 140 10/01/2021   K 4.1 10/01/2021   CL 103 10/01/2021   CREATININE 0.79 10/01/2021   BUN 16 10/01/2021   CO2 30 10/01/2021   TSH 1.63 10/01/2021   HGBA1C 5.5 10/01/2021   MICROALBUR <0.7 02/22/2015    Lab Results  Component Value Date   TSH 1.63 10/01/2021   Lab Results  Component Value Date   WBC 3.4 (L) 10/01/2021   HGB 11.9 (L) 10/01/2021   HCT 36.5 10/01/2021   MCV 86.9 10/01/2021   PLT 340.0 10/01/2021   Lab Results  Component Value Date   NA 140 10/01/2021   K 4.1 10/01/2021   CO2 30 10/01/2021   GLUCOSE 97 10/01/2021   BUN 16 10/01/2021   CREATININE 0.79 10/01/2021   BILITOT 0.5 10/01/2021   ALKPHOS 58 10/01/2021   AST 16 10/01/2021   ALT 21 10/01/2021   PROT 7.4 10/01/2021   ALBUMIN 4.5 10/01/2021   CALCIUM 9.9 10/01/2021   GFR 79.41 10/01/2021   Lab Results  Component Value Date   CHOL 168  10/01/2021   Lab Results  Component Value Date   HDL 49.80 10/01/2021   Lab Results  Component Value Date   LDLCALC 105 (H) 10/01/2021   Lab Results  Component Value Date   TRIG 66.0 10/01/2021   Lab Results  Component Value Date   CHOLHDL 3 10/01/2021   Lab Results  Component Value Date   HGBA1C 5.5 10/01/2021       Assessment & Plan:   Problem List Items Addressed This Visit   None    No orders of the defined types were placed in this encounter.   I discussed the assessment and treatment plan with the patient. The patient was provided an opportunity to ask questions and all were answered. The patient agreed with the plan and demonstrated an understanding of the instructions.   The patient was advised to call back or seek an in-person evaluation if the symptoms worsen or if the condition fails to improve as anticipated.  I,[name],acting as a Neurosurgeon for [employer], DO.,have documented all relevant documentation on the behalf of Donato Schultz, DO,as directed by  Donato Schultz, DO while in the presence of Donato Schultz, DO.  I provided 20 minutes of face-to-face time during this encounter.   Donato Schultz, DO [facility] [phone] (phone) [phone] (fax)  [facility]

## 2022-03-11 NOTE — Assessment & Plan Note (Signed)
Antiviral sent in  ?Tessalon perles prn ?Call or rto prn  ?

## 2022-03-18 ENCOUNTER — Ambulatory Visit (INDEPENDENT_AMBULATORY_CARE_PROVIDER_SITE_OTHER): Admitting: Family Medicine

## 2022-04-01 ENCOUNTER — Ambulatory Visit (INDEPENDENT_AMBULATORY_CARE_PROVIDER_SITE_OTHER): Admitting: Family Medicine

## 2022-04-01 ENCOUNTER — Encounter: Payer: Self-pay | Admitting: Family Medicine

## 2022-04-01 VITALS — BP 160/90 | HR 65 | Temp 97.9°F | Resp 18 | Ht 66.0 in | Wt 192.6 lb

## 2022-04-01 DIAGNOSIS — I10 Essential (primary) hypertension: Secondary | ICD-10-CM | POA: Diagnosis not present

## 2022-04-01 DIAGNOSIS — E782 Mixed hyperlipidemia: Secondary | ICD-10-CM | POA: Diagnosis not present

## 2022-04-01 DIAGNOSIS — R7303 Prediabetes: Secondary | ICD-10-CM

## 2022-04-01 DIAGNOSIS — E559 Vitamin D deficiency, unspecified: Secondary | ICD-10-CM | POA: Diagnosis not present

## 2022-04-01 LAB — COMPREHENSIVE METABOLIC PANEL
ALT: 15 U/L (ref 0–35)
AST: 14 U/L (ref 0–37)
Albumin: 4.4 g/dL (ref 3.5–5.2)
Alkaline Phosphatase: 61 U/L (ref 39–117)
BUN: 14 mg/dL (ref 6–23)
CO2: 29 mEq/L (ref 19–32)
Calcium: 10.1 mg/dL (ref 8.4–10.5)
Chloride: 102 mEq/L (ref 96–112)
Creatinine, Ser: 0.76 mg/dL (ref 0.40–1.20)
GFR: 82.89 mL/min (ref >60.00)
Glucose, Bld: 93 mg/dL (ref 70–99)
Potassium: 4.6 mEq/L (ref 3.5–5.1)
Sodium: 139 mEq/L (ref 135–145)
Total Bilirubin: 0.5 mg/dL (ref 0.2–1.2)
Total Protein: 7.3 g/dL (ref 6.0–8.3)

## 2022-04-01 LAB — LIPID PANEL
Cholesterol: 170 mg/dL (ref 0–200)
HDL: 48.3 mg/dL (ref >39.00)
LDL Cholesterol: 108 mg/dL — ABNORMAL HIGH (ref 0–99)
NonHDL: 121.41
Total CHOL/HDL Ratio: 4
Triglycerides: 66 mg/dL (ref 0.0–149.0)
VLDL: 13.2 mg/dL (ref 0.0–40.0)

## 2022-04-01 LAB — HEMOGLOBIN A1C: Hgb A1c MFr Bld: 5.5 % (ref 4.6–6.5)

## 2022-04-01 LAB — VITAMIN D 25 HYDROXY (VIT D DEFICIENCY, FRACTURES): VITD: 35.71 ng/mL (ref 30.00–100.00)

## 2022-04-01 MED ORDER — LOSARTAN POTASSIUM 50 MG PO TABS: 50.0000 mg | ORAL_TABLET | Freq: Every day | ORAL | 3 refills | Status: DC

## 2022-04-01 MED ORDER — HYDROCHLOROTHIAZIDE 25 MG PO TABS: 25.0000 mg | ORAL_TABLET | Freq: Every day | ORAL | 3 refills | Status: DC

## 2022-04-01 MED ORDER — AMLODIPINE BESYLATE 10 MG PO TABS: 10.0000 mg | ORAL_TABLET | Freq: Every day | ORAL | 3 refills | Status: DC

## 2022-04-01 MED ORDER — CARVEDILOL 6.25 MG PO TABS: 6.2500 mg | ORAL_TABLET | Freq: Two times a day (BID) | ORAL | 3 refills | Status: DC

## 2022-04-01 NOTE — Progress Notes (Signed)
Subjective:   By signing my name below, I, [name], attest that this documentation has been prepared under the direction and in the presence of Ann Held, DO. 04/01/2022    Patient ID: Alicia Knapp, female    DOB: 01/08/1956, 66 y.o.   MRN: [id number]  Chief Complaint  Patient presents with   Hyperlipidemia   Hypertension   Follow-up    Hyperlipidemia Pertinent negatives include no chest pain, myalgias or shortness of breath.  Hypertension Pertinent negatives include no blurred vision, chest pain, headaches, malaise/fatigue, palpitations or shortness of breath.  Patient is in today for a follow up visit.   Her blood pressure is elevated during this visit. She did take her blood pressure medication this morning due to fasting for her upcomming lab work. She reports during her last visit with her healthy weight and wellness clinic her systolic blood pressure was 120. She is planning on taking it with food after this visit. She continues taking 10 mg amlodipine daily PO, 6.25 mg carvedilol daily PO, 25 mg hydrochlorothiazide daily PO, 50 mg losartan daily PO and reports no new issues while taking them. She has mild swelling in her ankles. She reports her ankles are typically swollen. She also reports her mother also has a history of swollen ankles similar to hers.  BP Readings from Last 3 Encounters:  04/01/22 (!) 160/90  02/24/22 123/71  01/27/22 131/74   Pulse Readings from Last 3 Encounters:  04/01/22 65  02/24/22 69  01/27/22 67   She does not regularly check her blood sugars at home. She continues taking 500 mg metformin 2x daily PO and reports no new issues while taking it. She checks her blood sugars at her weight loss clinic.  She continues seeing a healthy weight and wellness program and reports doing well.  Wt Readings from Last 3 Encounters:  04/01/22 192 lb 9.6 oz (87.4 kg)  02/24/22 186 lb (84.4 kg)  01/27/22 189 lb (85.7 kg)   Lab Results   Component Value Date   HGBA1C 5.5 10/01/2021    Past Medical History:  Diagnosis Date   Anxiety    B12 deficiency    Constipation    Heart murmur    Hyperlipidemia    Hypertension    Joint pain    Postoperative nausea    Prediabetes    Swelling    lower ext   Vitamin D deficiency     Past Surgical History:  Procedure Laterality Date   ABDOMINAL HYSTERECTOMY     APPENDECTOMY     COLONOSCOPY  05/22/2015   Dr.Pyrtle   POLYPECTOMY      Family History  Problem Relation Age of Onset   Cancer Father        prostate   Prostate cancer Father    Diabetes Mother    Thyroid disease Mother    Hypertension Mother    Hyperlipidemia Mother    Kidney disease Mother    Cancer Mother    Sleep apnea Mother    Obesity Mother    Hypertension Sister    Hypertension Brother    Stroke Brother    Colon cancer Brother 32       died 79 - 4-5 months after dx   Arthritis Maternal Grandmother    Esophageal cancer Neg Hx    Rectal cancer Neg Hx    Stomach cancer Neg Hx     Social History   Socioeconomic History   Marital status: Married  Spouse name: [name]   Number of children: Not on file   Years of education: Not on file   Highest education level: Not on file  Occupational History   Occupation: Stay at home spouse  Tobacco Use   Smoking status: Former    Types: Cigarettes    Quit date: 09/17/2014    Years since quitting: 7.5   Smokeless tobacco: Never  Vaping Use   Vaping Use: Never used  Substance and Sexual Activity   Alcohol use: No    Alcohol/week: 0.0 standard drinks   Drug use: No   Sexual activity: Yes    Partners: Male  Other Topics Concern   Not on file  Social History Narrative   Not on file   Social Determinants of Health   Financial Resource Strain: Not on file  Food Insecurity: Not on file  Transportation Needs: Not on file  Physical Activity: Not on file  Stress: Not on file  Social Connections: Not on file  Intimate Partner Violence:  Not on file    Outpatient Medications Prior to Visit  Medication Sig Dispense Refill   ARIPiprazole (ABILIFY) 2 MG tablet Take 2 mg by mouth daily.      ARIPiprazole (ABILIFY) 5 MG tablet Take 1 tablet by mouth daily.  0   atorvastatin (LIPITOR) 10 MG tablet Take 1 tablet (10 mg total) by mouth daily. 90 tablet 3   benzonatate (TESSALON PERLES) 100 MG capsule Take 2 capsules (200 mg total) by mouth 3 (three) times daily as needed for cough. 40 capsule 0   fluticasone (FLONASE) 50 MCG/ACT nasal spray Place 2 sprays into both nostrils daily. 48 g 1   levocetirizine (XYZAL) 5 MG tablet Take 1 tablet (5 mg total) by mouth every evening. 90 tablet 3   metFORMIN (GLUCOPHAGE) 500 MG tablet Take 1 tablet (500 mg total) by mouth 2 (two) times daily with a meal. 180 tablet 0   triamcinolone cream (KENALOG) 0.1 % Apply 1 application topically 2 (two) times daily. 30 g 0   amLODipine (NORVASC) 10 MG tablet Take 1 tablet (10 mg total) by mouth daily. 90 tablet 3   carvedilol (COREG) 6.25 MG tablet Take 1 tablet (6.25 mg total) by mouth 2 (two) times daily with a meal. 180 tablet 3   hydrochlorothiazide (HYDRODIURIL) 25 MG tablet Take 1 tablet (25 mg total) by mouth daily. 90 tablet 3   losartan (COZAAR) 50 MG tablet Take 1 tablet (50 mg total) by mouth daily. 90 tablet 3   No facility-administered medications prior to visit.    Allergies  Allergen Reactions   Erythromycin     Causes stomach cramps   Penicillins     rash    Review of Systems  Constitutional:  Negative for chills, fever and malaise/fatigue.  HENT:  Negative for congestion and hearing loss.   Eyes:  Negative for blurred vision and discharge.  Respiratory:  Negative for cough, sputum production and shortness of breath.   Cardiovascular:  Positive for leg swelling (swelling in bilateral ankles). Negative for chest pain and palpitations.  Gastrointestinal:  Negative for abdominal pain, blood in stool, constipation, diarrhea,  heartburn, nausea and vomiting.  Genitourinary:  Negative for dysuria, frequency, hematuria and urgency.  Musculoskeletal:  Negative for back pain, falls and myalgias.  Skin:  Negative for rash.  Neurological:  Negative for dizziness, sensory change, loss of consciousness, weakness and headaches.  Endo/Heme/Allergies:  Negative for environmental allergies. Does not bruise/bleed easily.  Psychiatric/Behavioral:  Negative for depression and suicidal ideas. The patient is not nervous/anxious and does not have insomnia.       Objective:    Physical Exam Vitals and nursing note reviewed.  Constitutional:      General: She is not in acute distress.    Appearance: Normal appearance. She is well-developed. She is not ill-appearing.  HENT:     Head: Normocephalic and atraumatic.     Right Ear: External ear normal.     Left Ear: External ear normal.  Eyes:     Extraocular Movements: Extraocular movements intact.     Conjunctiva/sclera: Conjunctivae normal.     Pupils: Pupils are equal, round, and reactive to light.  Neck:     Thyroid: No thyromegaly.     Vascular: No carotid bruit or JVD.  Cardiovascular:     Rate and Rhythm: Normal rate and regular rhythm.     Heart sounds: Normal heart sounds. No murmur heard.   No gallop.  Pulmonary:     Effort: Pulmonary effort is normal. No respiratory distress.     Breath sounds: Normal breath sounds. No wheezing or rales.  Chest:     Chest wall: No tenderness.  Musculoskeletal:     Cervical back: Normal range of motion and neck supple.  Skin:    General: Skin is warm and dry.  Neurological:     Mental Status: She is alert and oriented to person, place, and time.  Psychiatric:        Judgment: Judgment normal.    BP (!) 160/90 (BP Location: Left Arm, Patient Position: Sitting, Cuff Size: Normal) Comment: Pt did not take blood pressure meds  Pulse 65   Temp 97.9 F (36.6 C) (Oral)   Resp 18   Ht '5\' 6"'$  (1.676 m)   Wt 192 lb 9.6 oz (87.4  kg)   SpO2 100%   BMI 31.09 kg/m  Wt Readings from Last 3 Encounters:  04/01/22 192 lb 9.6 oz (87.4 kg)  02/24/22 186 lb (84.4 kg)  01/27/22 189 lb (85.7 kg)    Diabetic Foot Exam - Simple   No data filed    Lab Results  Component Value Date   WBC 3.4 (L) 10/01/2021   HGB 11.9 (L) 10/01/2021   HCT 36.5 10/01/2021   PLT 340.0 10/01/2021   GLUCOSE 97 10/01/2021   CHOL 168 10/01/2021   TRIG 66.0 10/01/2021   HDL 49.80 10/01/2021   LDLDIRECT 140.5 08/12/2013   LDLCALC 105 (H) 10/01/2021   ALT 21 10/01/2021   AST 16 10/01/2021   NA 140 10/01/2021   K 4.1 10/01/2021   CL 103 10/01/2021   CREATININE 0.79 10/01/2021   BUN 16 10/01/2021   CO2 30 10/01/2021   TSH 1.63 10/01/2021   HGBA1C 5.5 10/01/2021   MICROALBUR <0.7 02/22/2015    Lab Results  Component Value Date   TSH 1.63 10/01/2021   Lab Results  Component Value Date   WBC 3.4 (L) 10/01/2021   HGB 11.9 (L) 10/01/2021   HCT 36.5 10/01/2021   MCV 86.9 10/01/2021   PLT 340.0 10/01/2021   Lab Results  Component Value Date   NA 140 10/01/2021   K 4.1 10/01/2021   CO2 30 10/01/2021   GLUCOSE 97 10/01/2021   BUN 16 10/01/2021   CREATININE 0.79 10/01/2021   BILITOT 0.5 10/01/2021   ALKPHOS 58 10/01/2021   AST 16 10/01/2021   ALT 21 10/01/2021   PROT 7.4 10/01/2021   ALBUMIN 4.5 10/01/2021  CALCIUM 9.9 10/01/2021   GFR 79.41 10/01/2021   Lab Results  Component Value Date   CHOL 168 10/01/2021   Lab Results  Component Value Date   HDL 49.80 10/01/2021   Lab Results  Component Value Date   LDLCALC 105 (H) 10/01/2021   Lab Results  Component Value Date   TRIG 66.0 10/01/2021   Lab Results  Component Value Date   CHOLHDL 3 10/01/2021   Lab Results  Component Value Date   HGBA1C 5.5 10/01/2021       Assessment & Plan:   Problem List Items Addressed This Visit       Unprioritized   Vitamin D deficiency   Relevant Orders   VITAMIN D 25 Hydroxy (Vit-D Deficiency, Fractures)    Prediabetes - Primary   Relevant Orders   Hemoglobin A1c   Insulin, random   Hyperlipidemia    Encourage heart healthy diet such as MIND or DASH diet, increase exercise, avoid trans fats, simple carbohydrates and processed foods, consider a krill or fish or flaxseed oil cap daily.        Relevant Medications   amLODipine (NORVASC) 10 MG tablet   carvedilol (COREG) 6.25 MG tablet   hydrochlorothiazide (HYDRODIURIL) 25 MG tablet   losartan (COZAAR) 50 MG tablet   Essential hypertension    Well controlled, no changes to meds. Encouraged heart healthy diet such as the DASH diet and exercise as tolerated. ----she has not taken her meds today       Relevant Medications   amLODipine (NORVASC) 10 MG tablet   carvedilol (COREG) 6.25 MG tablet   hydrochlorothiazide (HYDRODIURIL) 25 MG tablet   losartan (COZAAR) 50 MG tablet   Other Visit Diagnoses     Primary hypertension       Relevant Medications   amLODipine (NORVASC) 10 MG tablet   carvedilol (COREG) 6.25 MG tablet   hydrochlorothiazide (HYDRODIURIL) 25 MG tablet   losartan (COZAAR) 50 MG tablet   Other Relevant Orders   Comprehensive metabolic panel   Lipid panel   VITAMIN D 25 Hydroxy (Vit-D Deficiency, Fractures)   Insulin, random        Meds ordered this encounter  Medications   amLODipine (NORVASC) 10 MG tablet    Sig: Take 1 tablet (10 mg total) by mouth daily.    Dispense:  90 tablet    Refill:  3   carvedilol (COREG) 6.25 MG tablet    Sig: Take 1 tablet (6.25 mg total) by mouth 2 (two) times daily with a meal.    Dispense:  180 tablet    Refill:  3   hydrochlorothiazide (HYDRODIURIL) 25 MG tablet    Sig: Take 1 tablet (25 mg total) by mouth daily.    Dispense:  90 tablet    Refill:  3   losartan (COZAAR) 50 MG tablet    Sig: Take 1 tablet (50 mg total) by mouth daily.    Dispense:  90 tablet    Refill:  3    I, Ann Held, DO, personally preformed the services described in this documentation.   All medical record entries made by the scribe were at my direction and in my presence.  I have reviewed the chart and discharge instructions (if applicable) and agree that the record reflects my personal performance and is accurate and complete. 04/01/2022   I,[name],acting as a scribe for Ann Held, DO.,have documented all relevant documentation on the behalf of Ann Held,  DO,as directed by  Ann Held, DO while in the presence of Ann Held, DO.   Ann Held, DO

## 2022-04-01 NOTE — Patient Instructions (Signed)

## 2022-04-01 NOTE — Assessment & Plan Note (Signed)
Well controlled, no changes to meds. Encouraged heart healthy diet such as the DASH diet and exercise as tolerated. ----she has not taken her meds today

## 2022-04-01 NOTE — Assessment & Plan Note (Signed)
Encourage heart healthy diet such as MIND or DASH diet, increase exercise, avoid trans fats, simple carbohydrates and processed foods, consider a krill or fish or flaxseed oil cap daily.  °

## 2022-04-02 LAB — INSULIN, RANDOM: Insulin: 14.4 u[IU]/mL

## 2022-04-07 ENCOUNTER — Ambulatory Visit (INDEPENDENT_AMBULATORY_CARE_PROVIDER_SITE_OTHER): Admitting: Family Medicine

## 2022-04-07 ENCOUNTER — Encounter (INDEPENDENT_AMBULATORY_CARE_PROVIDER_SITE_OTHER): Payer: Self-pay | Admitting: Family Medicine

## 2022-04-07 VITALS — BP 153/77 | HR 69 | Temp 98.6°F | Ht 66.0 in | Wt 190.0 lb

## 2022-04-07 DIAGNOSIS — E669 Obesity, unspecified: Secondary | ICD-10-CM | POA: Diagnosis not present

## 2022-04-07 DIAGNOSIS — E559 Vitamin D deficiency, unspecified: Secondary | ICD-10-CM

## 2022-04-07 DIAGNOSIS — I10 Essential (primary) hypertension: Secondary | ICD-10-CM | POA: Diagnosis not present

## 2022-04-07 DIAGNOSIS — Z6830 Body mass index (BMI) 30.0-30.9, adult: Secondary | ICD-10-CM

## 2022-04-07 MED ORDER — VITAMIN D (ERGOCALCIFEROL) 1.25 MG (50000 UNIT) PO CAPS: 50000.0000 [IU] | ORAL_CAPSULE | ORAL | 0 refills | Status: DC

## 2022-04-10 NOTE — Progress Notes (Unsigned)
Chief Complaint:   OBESITY Alicia Knapp is here to discuss her progress with her obesity treatment plan along with follow-up of her obesity related diagnoses. Alicia Knapp is on the Category 2 Plan and states she is following her eating plan approximately 50% of the time. Alicia Knapp states she is gardening 50 minutes 9 days.  Today's visit was #: 71 Starting weight: 212 lbs Starting date: 08/03/2018 Today's weight: 190 lbs Today's date: 04/07/2022 Total lbs lost to date: 22 lbs Total lbs lost since last in-office visit: 0  Interim History: Alicia Knapp has been gardening frequently and consistently over the last few weeks. She did eat out or alternate with eat food from [employer]. She anticipates getting back on plan for the next few weeks. She has really enjoyed the chicken dinners from [employer].  Subjective:   1. Essential hypertension Fransisca's blood pressure is elevated today. Denies chest pain, chest pressure and headache. She is currently taking Amlodipine and Losartan.  2. Vitamin D deficiency Alicia Knapp is currently taking over the counter Vit D. Denies any nausea, vomiting or muscle weakness. She notes fatigue. Her last Vit D level of 35.71.  Assessment/Plan:   1. Essential hypertension Alicia Knapp will continue current medications with no changes in dose. She will follow up with blood pressure at next appointment. If bp still elevated, need to consider changing medications.  2. Vitamin D deficiency Alicia Knapp will Start  Vit D 50,000 IU once a week for 1 month with 0 refills. We will obtain labs in 3 months.  -START Vitamin D, Ergocalciferol, (DRISDOL) 1.25 MG (50000 UNIT) CAPS capsule; Take 1 capsule (50,000 Units total) by mouth every 7 (seven) days.  Dispense: 12 capsule; Refill: 0  3. Obesity with current BMI of 30.7 Alicia Knapp is currently in the action stage of change. As such, her goal is to continue with weight loss efforts. She has agreed to the Category 2 Plan.   Exercise goals: All adults should avoid  inactivity. Some physical activity is better than none, and adults who participate in any amount of physical activity gain some health benefits.  Behavioral modification strategies: increasing lean protein intake, meal planning and cooking strategies, keeping healthy foods in the home, and planning for success.  Alicia Knapp has agreed to follow-up with our clinic in 3 weeks. She was informed of the importance of frequent follow-up visits to maximize her success with intensive lifestyle modifications for her multiple health conditions.   Objective:   Blood pressure (!) 153/77, pulse 69, temperature 98.6 F (37 C), height '5\' 6"'$  (1.676 m), weight 190 lb (86.2 kg), SpO2 99 %. Body mass index is 30.67 kg/m.  General: Cooperative, alert, well developed, in no acute distress. HEENT: Conjunctivae and lids unremarkable. Cardiovascular: Regular rhythm.  Lungs: Normal work of breathing. Neurologic: No focal deficits.   Lab Results  Component Value Date   CREATININE 0.76 04/01/2022   BUN 14 04/01/2022   NA 139 04/01/2022   K 4.6 04/01/2022   CL 102 04/01/2022   CO2 29 04/01/2022   Lab Results  Component Value Date   ALT 15 04/01/2022   AST 14 04/01/2022   ALKPHOS 61 04/01/2022   BILITOT 0.5 04/01/2022   Lab Results  Component Value Date   HGBA1C 5.5 04/01/2022   HGBA1C 5.5 10/01/2021   HGBA1C 5.3 12/25/2020   HGBA1C 5.3 08/08/2020   HGBA1C 5.1 08/16/2019   Lab Results  Component Value Date   INSULIN 11.2 12/25/2020   INSULIN 20.6 08/08/2020  INSULIN 10.4 08/16/2019   INSULIN 20.3 04/08/2019   INSULIN 13.8 11/09/2018   Lab Results  Component Value Date   TSH 1.63 10/01/2021   Lab Results  Component Value Date   CHOL 170 04/01/2022   HDL 48.30 04/01/2022   LDLCALC 108 (H) 04/01/2022   LDLDIRECT 140.5 08/12/2013   TRIG 66.0 04/01/2022   CHOLHDL 4 04/01/2022   Lab Results  Component Value Date   VD25OH 35.71 04/01/2022   VD25OH 36.62 10/01/2021   VD25OH 66.09 03/23/2020    Lab Results  Component Value Date   WBC 3.4 (L) 10/01/2021   HGB 11.9 (L) 10/01/2021   HCT 36.5 10/01/2021   MCV 86.9 10/01/2021   PLT 340.0 10/01/2021   No results found for: "IRON", "TIBC", "FERRITIN"  Attestation Statements:   Reviewed by clinician on day of visit: allergies, medications, problem list, medical history, surgical history, family history, social history, and previous encounter notes.  I, Elnora Morrison, RMA am acting as transcriptionist for Coralie Common, MD.  I have reviewed the above documentation for accuracy and completeness, and I agree with the above. -  ***

## 2022-04-29 ENCOUNTER — Encounter (INDEPENDENT_AMBULATORY_CARE_PROVIDER_SITE_OTHER): Payer: Self-pay | Admitting: Family Medicine

## 2022-04-29 ENCOUNTER — Ambulatory Visit (INDEPENDENT_AMBULATORY_CARE_PROVIDER_SITE_OTHER): Admitting: Family Medicine

## 2022-04-29 VITALS — BP 132/76 | HR 67 | Temp 98.4°F | Ht 66.0 in | Wt 187.0 lb

## 2022-04-29 DIAGNOSIS — E559 Vitamin D deficiency, unspecified: Secondary | ICD-10-CM

## 2022-04-29 DIAGNOSIS — E669 Obesity, unspecified: Secondary | ICD-10-CM | POA: Diagnosis not present

## 2022-04-29 DIAGNOSIS — I10 Essential (primary) hypertension: Secondary | ICD-10-CM

## 2022-04-29 DIAGNOSIS — Z6830 Body mass index (BMI) 30.0-30.9, adult: Secondary | ICD-10-CM

## 2022-04-29 DIAGNOSIS — Z87891 Personal history of nicotine dependence: Secondary | ICD-10-CM

## 2022-04-30 NOTE — Progress Notes (Signed)
Chief Complaint:   OBESITY Alicia Knapp is here to discuss her progress with her obesity treatment plan along with follow-up of her obesity related diagnoses. Alicia Knapp is on the Category 2 Plan and states she is following her eating plan approximately 50% of the time. Alicia Knapp states she is strength training/cycling/elliptical 60 minutes 1-3 times per week.  Today's visit was #: 24 Starting weight: 212 lbs Starting date: 08/03/2018 Today's weight: 187 lbs Today's date: 04/29/2022 Total lbs lost to date: 25 lbs Total lbs lost since last in-office visit: 3  Interim History: Alicia Knapp has been gardening more frequently. She has followed the meal plan more consistently and feels good about her indulgent intake. She did eat out a few times. Planning to go downtown for July 4th. Wants to continue on same meal plan. She turned her office into a gym.  Subjective:   1. Essential hypertension Lakina's blood pressure is well controlled. She is taking Cozaar, HCTZ and Coreg.  2. Vitamin D deficiency Alicia Knapp is currently taking prescription Vit D 50,000 IU once a week but she erroneously took pills daily instead of weekly.  Assessment/Plan:   1. Essential hypertension Alicia Knapp will continue currently medications without change in dose.  2. Vitamin D deficiency Alicia Knapp will hold Vit D until next month.  3. Obesity with current BMI of 30.2 Alicia Knapp is currently in the action stage of change. As such, her goal is to continue with weight loss efforts. She has agreed to the Category 2 Plan.   Exercise goals: As is.  Behavioral modification strategies: increasing lean protein intake, meal planning and cooking strategies, keeping healthy foods in the home, and planning for success.  Alicia Knapp has agreed to follow-up with our clinic in 3 weeks. She was informed of the importance of frequent follow-up visits to maximize her success with intensive lifestyle modifications for her multiple health conditions.   Objective:   Blood  pressure 132/76, pulse 67, temperature 98.4 F (36.9 C), height '5\' 6"'$  (1.676 m), weight 187 lb (84.8 kg), SpO2 100 %. Body mass index is 30.18 kg/m.  General: Cooperative, alert, well developed, in no acute distress. HEENT: Conjunctivae and lids unremarkable. Cardiovascular: Regular rhythm.  Lungs: Normal work of breathing. Neurologic: No focal deficits.   Lab Results  Component Value Date   CREATININE 0.76 04/01/2022   BUN 14 04/01/2022   NA 139 04/01/2022   K 4.6 04/01/2022   CL 102 04/01/2022   CO2 29 04/01/2022   Lab Results  Component Value Date   ALT 15 04/01/2022   AST 14 04/01/2022   ALKPHOS 61 04/01/2022   BILITOT 0.5 04/01/2022   Lab Results  Component Value Date   HGBA1C 5.5 04/01/2022   HGBA1C 5.5 10/01/2021   HGBA1C 5.3 12/25/2020   HGBA1C 5.3 08/08/2020   HGBA1C 5.1 08/16/2019   Lab Results  Component Value Date   INSULIN 11.2 12/25/2020   INSULIN 20.6 08/08/2020   INSULIN 10.4 08/16/2019   INSULIN 20.3 04/08/2019   INSULIN 13.8 11/09/2018   Lab Results  Component Value Date   TSH 1.63 10/01/2021   Lab Results  Component Value Date   CHOL 170 04/01/2022   HDL 48.30 04/01/2022   LDLCALC 108 (H) 04/01/2022   LDLDIRECT 140.5 08/12/2013   TRIG 66.0 04/01/2022   CHOLHDL 4 04/01/2022   Lab Results  Component Value Date   VD25OH 35.71 04/01/2022   VD25OH 36.62 10/01/2021   VD25OH 66.09 03/23/2020   Lab Results  Component Value Date  WBC 3.4 (L) 10/01/2021   HGB 11.9 (L) 10/01/2021   HCT 36.5 10/01/2021   MCV 86.9 10/01/2021   PLT 340.0 10/01/2021   No results found for: "IRON", "TIBC", "FERRITIN"  Attestation Statements:   Reviewed by clinician on day of visit: allergies, medications, problem list, medical history, surgical history, family history, social history, and previous encounter notes.  I, Elnora Morrison, RMA am acting as transcriptionist for Coralie Common, MD.  I have reviewed the above documentation for accuracy and  completeness, and I agree with the above. - Coralie Common, MD

## 2022-05-20 ENCOUNTER — Encounter (INDEPENDENT_AMBULATORY_CARE_PROVIDER_SITE_OTHER): Payer: Self-pay | Admitting: Family Medicine

## 2022-05-20 ENCOUNTER — Ambulatory Visit (INDEPENDENT_AMBULATORY_CARE_PROVIDER_SITE_OTHER): Admitting: Family Medicine

## 2022-05-20 VITALS — BP 115/76 | HR 63 | Temp 98.4°F | Ht 66.0 in | Wt 189.0 lb

## 2022-05-20 DIAGNOSIS — Z87891 Personal history of nicotine dependence: Secondary | ICD-10-CM | POA: Diagnosis not present

## 2022-05-20 DIAGNOSIS — I10 Essential (primary) hypertension: Secondary | ICD-10-CM

## 2022-05-20 DIAGNOSIS — E669 Obesity, unspecified: Secondary | ICD-10-CM | POA: Diagnosis not present

## 2022-05-20 DIAGNOSIS — Z6830 Body mass index (BMI) 30.0-30.9, adult: Secondary | ICD-10-CM | POA: Diagnosis not present

## 2022-05-22 NOTE — Progress Notes (Signed)
Chief Complaint:   OBESITY Shaniqua is here to discuss her progress with her obesity treatment plan along with follow-up of her obesity related diagnoses. Krissi is on the Category 2 Plan and states she is following her eating plan approximately 50% of the time. Levon states she is doing strength,elliptical,cycling 30,30 minutes 1-3 times per week.  Today's visit was #: 37 Starting weight: 212 lbs Starting date: 08/03/2018 Today's weight: 189 lbs Today's date: 05/20/2022 Total lbs lost to date: 23 lbs Total lbs lost since last in-office visit: 0  Interim History: Reinette has had a few friend gatherings recently. In August she does not have much coming up. In September she is planning to go to [place] for brunch. Has not gotten back into her routine of eating. She wants to tighten back up on carb intake.  Subjective:   1. Essential hypertension Nickole's blood pressure is controlled today. She is taking Cozaar, HCTZ, Norvasc.  Assessment/Plan:   1. Essential hypertension Anesha will continue with current medications without change in doses.  2. Obesity with current BMI of 30.5 Sabriyah is currently in the action stage of change. As such, her goal is to continue with weight loss efforts. She has agreed to the Category 2 Plan.   Exercise goals: As is.  Behavioral modification strategies: increasing lean protein intake, meal planning and cooking strategies, keeping healthy foods in the home, and planning for success.  Tylar has agreed to follow-up with our clinic in 4 weeks. She was informed of the importance of frequent follow-up visits to maximize her success with intensive lifestyle modifications for her multiple health conditions.   Objective:   Blood pressure 115/76, pulse 63, temperature 98.4 F (36.9 C), height '5\' 6"'$  (1.676 m), weight 189 lb (85.7 kg), SpO2 99 %. Body mass index is 30.51 kg/m.  General: Cooperative, alert, well developed, in no acute distress. HEENT: Conjunctivae  and lids unremarkable. Cardiovascular: Regular rhythm.  Lungs: Normal work of breathing. Neurologic: No focal deficits.   Lab Results  Component Value Date   CREATININE 0.76 04/01/2022   BUN 14 04/01/2022   NA 139 04/01/2022   K 4.6 04/01/2022   CL 102 04/01/2022   CO2 29 04/01/2022   Lab Results  Component Value Date   ALT 15 04/01/2022   AST 14 04/01/2022   ALKPHOS 61 04/01/2022   BILITOT 0.5 04/01/2022   Lab Results  Component Value Date   HGBA1C 5.5 04/01/2022   HGBA1C 5.5 10/01/2021   HGBA1C 5.3 12/25/2020   HGBA1C 5.3 08/08/2020   HGBA1C 5.1 08/16/2019   Lab Results  Component Value Date   INSULIN 11.2 12/25/2020   INSULIN 20.6 08/08/2020   INSULIN 10.4 08/16/2019   INSULIN 20.3 04/08/2019   INSULIN 13.8 11/09/2018   Lab Results  Component Value Date   TSH 1.63 10/01/2021   Lab Results  Component Value Date   CHOL 170 04/01/2022   HDL 48.30 04/01/2022   LDLCALC 108 (H) 04/01/2022   LDLDIRECT 140.5 08/12/2013   TRIG 66.0 04/01/2022   CHOLHDL 4 04/01/2022   Lab Results  Component Value Date   VD25OH 35.71 04/01/2022   VD25OH 36.62 10/01/2021   VD25OH 66.09 03/23/2020   Lab Results  Component Value Date   WBC 3.4 (L) 10/01/2021   HGB 11.9 (L) 10/01/2021   HCT 36.5 10/01/2021   MCV 86.9 10/01/2021   PLT 340.0 10/01/2021   No results found for: "IRON", "TIBC", "FERRITIN"  Attestation Statements:   Reviewed  by clinician on day of visit: allergies, medications, problem list, medical history, surgical history, family history, social history, and previous encounter notes.  I, Elnora Morrison, RMA am acting as transcriptionist for Coralie Common, MD.  I have reviewed the above documentation for accuracy and completeness, and I agree with the above. - Coralie Common, MD

## 2022-06-04 ENCOUNTER — Other Ambulatory Visit: Payer: Self-pay | Admitting: Family Medicine

## 2022-06-04 DIAGNOSIS — J302 Other seasonal allergic rhinitis: Secondary | ICD-10-CM

## 2022-06-11 ENCOUNTER — Encounter (INDEPENDENT_AMBULATORY_CARE_PROVIDER_SITE_OTHER): Payer: Self-pay

## 2022-06-19 ENCOUNTER — Ambulatory Visit (INDEPENDENT_AMBULATORY_CARE_PROVIDER_SITE_OTHER): Admitting: Family Medicine

## 2022-06-19 ENCOUNTER — Encounter (INDEPENDENT_AMBULATORY_CARE_PROVIDER_SITE_OTHER): Payer: Self-pay | Admitting: Family Medicine

## 2022-06-19 VITALS — BP 144/73 | HR 63 | Temp 98.3°F | Ht 66.0 in | Wt 192.0 lb

## 2022-06-19 DIAGNOSIS — Z6831 Body mass index (BMI) 31.0-31.9, adult: Secondary | ICD-10-CM

## 2022-06-19 DIAGNOSIS — E559 Vitamin D deficiency, unspecified: Secondary | ICD-10-CM

## 2022-06-19 DIAGNOSIS — R0602 Shortness of breath: Secondary | ICD-10-CM

## 2022-06-19 DIAGNOSIS — E669 Obesity, unspecified: Secondary | ICD-10-CM | POA: Diagnosis not present

## 2022-06-19 DIAGNOSIS — R7303 Prediabetes: Secondary | ICD-10-CM

## 2022-06-19 MED ORDER — METFORMIN HCL 500 MG PO TABS: 500.0000 mg | ORAL_TABLET | Freq: Two times a day (BID) | ORAL | 0 refills | Status: DC

## 2022-06-30 ENCOUNTER — Other Ambulatory Visit (INDEPENDENT_AMBULATORY_CARE_PROVIDER_SITE_OTHER): Payer: Self-pay | Admitting: Family Medicine

## 2022-06-30 DIAGNOSIS — E559 Vitamin D deficiency, unspecified: Secondary | ICD-10-CM

## 2022-06-30 NOTE — Progress Notes (Unsigned)
Chief Complaint:   OBESITY Alicia Knapp is here to discuss her progress with her obesity treatment plan along with follow-up of her obesity related diagnoses. Alicia Knapp is on the Category 2 Plan and states she is following her eating plan approximately 45-50% of the time. Alicia Knapp states she is strength,cycling 30 minutes 2-3 times per week.  Today's visit was #: 38 Starting weight: 212 lbs Starting date: 08/03/2018 Today's weight: 192 lbs Today's date: 06/19/2022 Total lbs lost to date: 20 lbs Total lbs lost since last in-office visit: 0  Interim History: Alicia Knapp thinks she may be interested in a new meal plan. Feels somewhat bored. Has been more consistent with physical activity. She wants more structure.  Subjective:   1. Prediabetes Alicia Knapp last A1c's 5.5 with still some cravings for carbs. No GI upset on Metformin.   2. SOBOE (shortness of breath on exertion) Alicia Knapp's previous IC 1065. Symptoms unchanged from previous trying to incorporate more activity.  3. Vitamin D deficiency Alicia Knapp is currently taking prescription Vit D 50,000 IU once a week.  Her last Vit D level of 35.71.  Assessment/Plan:   1. Prediabetes We will refill Metformin 500 mg by mouth twice a day for 1 month with 0 refills.  -Refill metFORMIN (GLUCOPHAGE) 500 MG tablet; Take 1 tablet (500 mg total) by mouth 2 (two) times daily with a meal.  Dispense: 180 tablet; Refill: 0  2. SOBOE (shortness of breath on exertion) IC today--1613--significant improvement.  3. Vitamin D deficiency Alicia Knapp will continue prescription Vit D.  4. Obesity with current BMI of 31.0 Alicia Knapp is currently in the action stage of change. As such, her goal is to continue with weight loss efforts. She has agreed to the [place] +200.  Exercise goals: As is.  Behavioral modification strategies: increasing lean protein intake, meal planning and cooking strategies, and planning for success.  Alicia Knapp has agreed to follow-up with our clinic in 3 weeks.  She was informed of the importance of frequent follow-up visits to maximize her success with intensive lifestyle modifications for her multiple health conditions.   Objective:   Blood pressure (!) 144/73, pulse 63, temperature 98.3 F (36.8 C), height '5\' 6"'$  (1.676 m), weight 192 lb (87.1 kg), SpO2 100 %. Body mass index is 30.99 kg/m.  General: Cooperative, alert, well developed, in no acute distress. HEENT: Conjunctivae and lids unremarkable. Cardiovascular: Regular rhythm.  Lungs: Normal work of breathing. Neurologic: No focal deficits.   Lab Results  Component Value Date   CREATININE 0.76 04/01/2022   BUN 14 04/01/2022   NA 139 04/01/2022   K 4.6 04/01/2022   CL 102 04/01/2022   CO2 29 04/01/2022   Lab Results  Component Value Date   ALT 15 04/01/2022   AST 14 04/01/2022   ALKPHOS 61 04/01/2022   BILITOT 0.5 04/01/2022   Lab Results  Component Value Date   HGBA1C 5.5 04/01/2022   HGBA1C 5.5 10/01/2021   HGBA1C 5.3 12/25/2020   HGBA1C 5.3 08/08/2020   HGBA1C 5.1 08/16/2019   Lab Results  Component Value Date   INSULIN 11.2 12/25/2020   INSULIN 20.6 08/08/2020   INSULIN 10.4 08/16/2019   INSULIN 20.3 04/08/2019   INSULIN 13.8 11/09/2018   Lab Results  Component Value Date   TSH 1.63 10/01/2021   Lab Results  Component Value Date   CHOL 170 04/01/2022   HDL 48.30 04/01/2022   LDLCALC 108 (H) 04/01/2022   LDLDIRECT 140.5 08/12/2013   TRIG 66.0 04/01/2022  CHOLHDL 4 04/01/2022   Lab Results  Component Value Date   VD25OH 35.71 04/01/2022   VD25OH 36.62 10/01/2021   VD25OH 66.09 03/23/2020   Lab Results  Component Value Date   WBC 3.4 (L) 10/01/2021   HGB 11.9 (L) 10/01/2021   HCT 36.5 10/01/2021   MCV 86.9 10/01/2021   PLT 340.0 10/01/2021   No results found for: "IRON", "TIBC", "FERRITIN"  Attestation Statements:   Reviewed by clinician on day of visit: allergies, medications, problem list, medical history, surgical history, family  history, social history, and previous encounter notes.  I, Elnora Morrison, RMA am acting as transcriptionist for Coralie Common, MD.  I have reviewed the above documentation for accuracy and completeness, and I agree with the above. - Coralie Common, MD

## 2022-07-14 ENCOUNTER — Ambulatory Visit (INDEPENDENT_AMBULATORY_CARE_PROVIDER_SITE_OTHER): Admitting: Family Medicine

## 2022-07-22 ENCOUNTER — Ambulatory Visit (INDEPENDENT_AMBULATORY_CARE_PROVIDER_SITE_OTHER): Admitting: Family Medicine

## 2022-07-22 ENCOUNTER — Encounter (INDEPENDENT_AMBULATORY_CARE_PROVIDER_SITE_OTHER): Payer: Self-pay | Admitting: Family Medicine

## 2022-07-22 VITALS — BP 146/76 | HR 63 | Temp 98.4°F | Ht 66.0 in | Wt 194.0 lb

## 2022-07-22 DIAGNOSIS — Z6831 Body mass index (BMI) 31.0-31.9, adult: Secondary | ICD-10-CM

## 2022-07-22 DIAGNOSIS — E559 Vitamin D deficiency, unspecified: Secondary | ICD-10-CM

## 2022-07-22 DIAGNOSIS — E669 Obesity, unspecified: Secondary | ICD-10-CM | POA: Diagnosis not present

## 2022-07-22 DIAGNOSIS — R7303 Prediabetes: Secondary | ICD-10-CM | POA: Diagnosis not present

## 2022-07-22 DIAGNOSIS — E782 Mixed hyperlipidemia: Secondary | ICD-10-CM | POA: Diagnosis not present

## 2022-07-23 NOTE — Progress Notes (Signed)
Chief Complaint:   OBESITY Alicia Knapp is here to discuss her progress with her obesity treatment plan along with follow-up of her obesity related diagnoses. [name] is on the [place] +200 cal and states she is following her eating plan approximately 70% of the time. [name] states she is doing cardio for 30 minutes 4 times per week and strength training for 30 minutes 3 times per week.  Today's visit was #: 67 Starting weight: 212 lbs Starting date: 08/03/2018 Today's weight: 194 lbs Today's date: 07/22/22 Total lbs lost to date: 18 Total lbs lost since last in-office visit: +2  Interim History: Patient still liking the pescatarian plan.  Unfortunately her dog passed away and she is very emotional today.  Some days she is doing [place] pancake and piece of [place] bacon or sausage.  She is also taking care of her husband after recent oral surgery.  Looking for possible other options for breakfast.  Subjective:   1. Vitamin D deficiency On prescription vitamin D.  She notes fatigue.  Denies nausea, vomiting, or muscle weakness.  2. Mixed hyperlipidemia LDL is 108, HDL is 48, triglycerides 66.  On Lipitor.  3. Prediabetes Last A1c was 5.5.  Insulin was 14.4. On Glucophage 500 mg twice daily.  Assessment/Plan:   1. Vitamin D deficiency Check vitamin D level today. - VITAMIN D 25 Hydroxy (Vit-D Deficiency, Fractures)  2. Mixed hyperlipidemia Check fasting lipid panel today. - Lipid Panel With LDL/HDL Ratio  3. Prediabetes Check A1c, insulin level, and CMP today. - Comprehensive metabolic panel - Hemoglobin A1c - Insulin, random  4. Obesity with current BMI of 31.4 [name] is currently in the action stage of change. As such, her goal is to continue with weight loss efforts. She has agreed to the [place] +200 cal.  Exercise goals: as is.  Behavioral modification strategies: increasing lean protein intake, meal planning and cooking strategies, keeping healthy foods in  the home, and planning for success.  Alicia Knapp has agreed to follow-up with our clinic in 3 weeks. She was informed of the importance of frequent follow-up visits to maximize her success with intensive lifestyle modifications for her multiple health conditions.   Alicia Knapp was informed we would discuss her lab results at her next visit unless there is a critical issue that needs to be addressed sooner. Alicia Knapp agreed to keep her next visit at the agreed upon time to discuss these results.  Objective:   Blood pressure (!) 146/76, pulse 63, temperature 98.4 F (36.9 C), height '5\' 6"'$  (1.676 m), weight 194 lb (88 kg), SpO2 100 %. Body mass index is 31.31 kg/m.  General: Cooperative, alert, well developed, in no acute distress. HEENT: Conjunctivae and lids unremarkable. Cardiovascular: Regular rhythm.  Lungs: Normal work of breathing. Neurologic: No focal deficits.   Lab Results  Component Value Date   CREATININE 0.80 07/22/2022   BUN 15 07/22/2022   NA 144 07/22/2022   K 4.9 07/22/2022   CL 105 07/22/2022   CO2 24 07/22/2022   Lab Results  Component Value Date   ALT 26 07/22/2022   AST 16 07/22/2022   ALKPHOS 81 07/22/2022   BILITOT 0.3 07/22/2022   Lab Results  Component Value Date   HGBA1C 5.5 07/22/2022   HGBA1C 5.5 04/01/2022   HGBA1C 5.5 10/01/2021   HGBA1C 5.3 12/25/2020   HGBA1C 5.3 08/08/2020   Lab Results  Component Value Date   INSULIN WILL FOLLOW 07/22/2022   INSULIN 11.2 12/25/2020  INSULIN 20.6 08/08/2020   INSULIN 10.4 08/16/2019   INSULIN 20.3 04/08/2019   Lab Results  Component Value Date   TSH 1.63 10/01/2021   Lab Results  Component Value Date   CHOL 166 07/22/2022   HDL 54 07/22/2022   LDLCALC 95 07/22/2022   LDLDIRECT 140.5 08/12/2013   TRIG 92 07/22/2022   CHOLHDL 4 04/01/2022   Lab Results  Component Value Date   VD25OH 43.2 07/22/2022   VD25OH 35.71 04/01/2022   VD25OH 36.62 10/01/2021   Lab Results  Component Value Date   WBC 3.4 (L)  10/01/2021   HGB 11.9 (L) 10/01/2021   HCT 36.5 10/01/2021   MCV 86.9 10/01/2021   PLT 340.0 10/01/2021   No results found for: "IRON", "TIBC", "FERRITIN"   Attestation Statements:   Reviewed by clinician on day of visit: allergies, medications, problem list, medical history, surgical history, family history, social history, and previous encounter notes.  I, Dawn Whitmire, FNP-C, am acting as Location manager for Coralie Common, MD.  I have reviewed the above documentation for accuracy and completeness, and I agree with the above. - Coralie Common, MD

## 2022-07-24 LAB — VITAMIN D 25 HYDROXY (VIT D DEFICIENCY, FRACTURES): Vit D, 25-Hydroxy: 43.2 ng/mL (ref 30.0–100.0)

## 2022-07-24 LAB — COMPREHENSIVE METABOLIC PANEL
ALT: 26 IU/L (ref 0–32)
AST: 16 IU/L (ref 0–40)
Albumin/Globulin Ratio: 1.6 (ref 1.2–2.2)
Albumin: 4.6 g/dL (ref 3.9–4.9)
Alkaline Phosphatase: 81 IU/L (ref 44–121)
BUN/Creatinine Ratio: 19 (ref 12–28)
BUN: 15 mg/dL (ref 8–27)
Bilirubin Total: 0.3 mg/dL (ref 0.0–1.2)
CO2: 24 mmol/L (ref 20–29)
Calcium: 9.7 mg/dL (ref 8.7–10.3)
Chloride: 105 mmol/L (ref 96–106)
Creatinine, Ser: 0.8 mg/dL (ref 0.57–1.00)
Globulin, Total: 2.9 g/dL (ref 1.5–4.5)
Glucose: 94 mg/dL (ref 70–99)
Potassium: 4.9 mmol/L (ref 3.5–5.2)
Sodium: 144 mmol/L (ref 134–144)
Total Protein: 7.5 g/dL (ref 6.0–8.5)
eGFR: 82 mL/min/{1.73_m2} (ref >59)

## 2022-07-24 LAB — LIPID PANEL WITH LDL/HDL RATIO
Cholesterol, Total: 166 mg/dL (ref 100–199)
HDL: 54 mg/dL (ref >39)
LDL Chol Calc (NIH): 95 mg/dL (ref 0–99)
LDL/HDL Ratio: 1.8 ratio (ref 0.0–3.2)
Triglycerides: 92 mg/dL (ref 0–149)
VLDL Cholesterol Cal: 17 mg/dL (ref 5–40)

## 2022-07-24 LAB — HEMOGLOBIN A1C
Est. average glucose Bld gHb Est-mCnc: 111 mg/dL
Hgb A1c MFr Bld: 5.5 % (ref 4.8–5.6)

## 2022-07-24 LAB — INSULIN, RANDOM: INSULIN: 19.3 u[IU]/mL (ref 2.6–24.9)

## 2022-08-18 ENCOUNTER — Encounter (INDEPENDENT_AMBULATORY_CARE_PROVIDER_SITE_OTHER): Payer: Self-pay | Admitting: Family Medicine

## 2022-08-18 ENCOUNTER — Ambulatory Visit (INDEPENDENT_AMBULATORY_CARE_PROVIDER_SITE_OTHER): Admitting: Family Medicine

## 2022-08-18 VITALS — BP 149/76 | HR 63 | Temp 98.4°F | Ht 66.0 in | Wt 194.0 lb

## 2022-08-18 DIAGNOSIS — E559 Vitamin D deficiency, unspecified: Secondary | ICD-10-CM

## 2022-08-18 DIAGNOSIS — Z6831 Body mass index (BMI) 31.0-31.9, adult: Secondary | ICD-10-CM | POA: Diagnosis not present

## 2022-08-18 DIAGNOSIS — I10 Essential (primary) hypertension: Secondary | ICD-10-CM | POA: Diagnosis not present

## 2022-08-18 DIAGNOSIS — Z6834 Body mass index (BMI) 34.0-34.9, adult: Secondary | ICD-10-CM

## 2022-08-18 DIAGNOSIS — E669 Obesity, unspecified: Secondary | ICD-10-CM | POA: Diagnosis not present

## 2022-08-18 MED ORDER — VITAMIN D (ERGOCALCIFEROL) 1.25 MG (50000 UNIT) PO CAPS: 50000.0000 [IU] | ORAL_CAPSULE | ORAL | 0 refills | Status: DC

## 2022-08-21 NOTE — Progress Notes (Signed)
Chief Complaint:   OBESITY Alicia Knapp is here to discuss her progress with her obesity treatment plan along with follow-up of her obesity related diagnoses. Alicia Knapp is on the [employer] and states she is following her eating plan approximately 80% of the time. Alicia Knapp states she is strengthen/cycling 30/30 minutes 3/2 times per week.  Today's visit was #: 85 Starting weight: 212 lbs Starting date: 08/03/2018 Today's weight: 194 lbs Today's date: 08/18/2022 Total lbs lost to date: 18 lbs Total lbs lost since last in-office visit: 0  Interim History: Alicia Knapp has been focusing on activity over last few weeks. Has cleared out her garden and is growing some fall crops. Had a few parties recently as well. Still following Pescatarian plan and is enjoying it. Has eaten off in terms of snack calorie control.  Subjective:   1. Essential hypertension Alicia Knapp's blood pressure slightly elevated today (has been for last 2 appointments). Denies chest pain, chest pressure and headache.  2. Vitamin D deficiency Alicia Knapp is currently taking prescription Vit D 50,000 IU once a week.  Her last Vit D level of 4.3. Denies any nausea, vomiting or muscle weakness. She notes fatigue.  Assessment/Plan:   1. Essential hypertension Follow up blood pressure at next appointment.  2. Vitamin D deficiency We will refill Vit D 50k IU once a week for 1 month with 0 refills.  -Refill Vitamin D, Ergocalciferol, (DRISDOL) 1.25 MG (50000 UNIT) CAPS capsule; Take 1 capsule (50,000 Units total) by mouth every 7 (seven) days.  Dispense: 12 capsule; Refill: 0  3. Obesity with current BMI of 31.3 Alicia Knapp is currently in the action stage of change. As such, her goal is to continue with weight loss efforts. She has agreed to the [place] +200.  Exercise goals: All adults should avoid inactivity. Some physical activity is better than none, and adults who participate in any amount of physical activity gain some health  benefits.  Behavioral modification strategies: increasing lean protein intake, meal planning and cooking strategies, keeping healthy foods in the home, and planning for success.  Alicia Knapp has agreed to follow-up with our clinic in 4 weeks. She was informed of the importance of frequent follow-up visits to maximize her success with intensive lifestyle modifications for her multiple health conditions.   Objective:   Blood pressure (!) 149/76, pulse 63, temperature 98.4 F (36.9 C), height '5\' 6"'$  (1.676 m), weight 194 lb (88 kg), SpO2 100 %. Body mass index is 31.31 kg/m.  General: Cooperative, alert, well developed, in no acute distress. HEENT: Conjunctivae and lids unremarkable. Cardiovascular: Regular rhythm.  Lungs: Normal work of breathing. Neurologic: No focal deficits.   Lab Results  Component Value Date   CREATININE 0.80 07/22/2022   BUN 15 07/22/2022   NA 144 07/22/2022   K 4.9 07/22/2022   CL 105 07/22/2022   CO2 24 07/22/2022   Lab Results  Component Value Date   ALT 26 07/22/2022   AST 16 07/22/2022   ALKPHOS 81 07/22/2022   BILITOT 0.3 07/22/2022   Lab Results  Component Value Date   HGBA1C 5.5 07/22/2022   HGBA1C 5.5 04/01/2022   HGBA1C 5.5 10/01/2021   HGBA1C 5.3 12/25/2020   HGBA1C 5.3 08/08/2020   Lab Results  Component Value Date   INSULIN 19.3 07/22/2022   INSULIN 11.2 12/25/2020   INSULIN 20.6 08/08/2020   INSULIN 10.4 08/16/2019   INSULIN 20.3 04/08/2019   Lab Results  Component Value Date   TSH 1.63 10/01/2021  Lab Results  Component Value Date   CHOL 166 07/22/2022   HDL 54 07/22/2022   LDLCALC 95 07/22/2022   LDLDIRECT 140.5 08/12/2013   TRIG 92 07/22/2022   CHOLHDL 4 04/01/2022   Lab Results  Component Value Date   VD25OH 43.2 07/22/2022   VD25OH 35.71 04/01/2022   VD25OH 36.62 10/01/2021   Lab Results  Component Value Date   WBC 3.4 (L) 10/01/2021   HGB 11.9 (L) 10/01/2021   HCT 36.5 10/01/2021   MCV 86.9 10/01/2021   PLT  340.0 10/01/2021   No results found for: "IRON", "TIBC", "FERRITIN"  Attestation Statements:   Reviewed by clinician on day of visit: allergies, medications, problem list, medical history, surgical history, family history, social history, and previous encounter notes.  I, Elnora Morrison, RMA am acting as transcriptionist for Coralie Common, MD.  I have reviewed the above documentation for accuracy and completeness, and I agree with the above. - Coralie Common, MD

## 2022-09-17 ENCOUNTER — Encounter (INDEPENDENT_AMBULATORY_CARE_PROVIDER_SITE_OTHER): Payer: Self-pay | Admitting: Family Medicine

## 2022-09-17 ENCOUNTER — Ambulatory Visit (INDEPENDENT_AMBULATORY_CARE_PROVIDER_SITE_OTHER): Admitting: Family Medicine

## 2022-09-17 VITALS — BP 137/80 | HR 67 | Temp 98.6°F | Ht 66.0 in | Wt 194.0 lb

## 2022-09-17 DIAGNOSIS — Z6831 Body mass index (BMI) 31.0-31.9, adult: Secondary | ICD-10-CM | POA: Diagnosis not present

## 2022-09-17 DIAGNOSIS — R7309 Other abnormal glucose: Secondary | ICD-10-CM | POA: Diagnosis not present

## 2022-09-17 DIAGNOSIS — E559 Vitamin D deficiency, unspecified: Secondary | ICD-10-CM

## 2022-09-17 DIAGNOSIS — E669 Obesity, unspecified: Secondary | ICD-10-CM

## 2022-09-17 MED ORDER — METFORMIN HCL 500 MG PO TABS: 500.0000 mg | ORAL_TABLET | Freq: Two times a day (BID) | ORAL | 0 refills | Status: DC

## 2022-09-17 MED ORDER — VITAMIN D (ERGOCALCIFEROL) 1.25 MG (50000 UNIT) PO CAPS: 50000.0000 [IU] | ORAL_CAPSULE | ORAL | 0 refills | Status: DC

## 2022-09-17 MED ORDER — OZEMPIC (0.25 OR 0.5 MG/DOSE) 2 MG/3ML ~~LOC~~ SOPN: 0.2500 mg | PEN_INJECTOR | SUBCUTANEOUS | 0 refills | Status: DC

## 2022-09-29 ENCOUNTER — Encounter (INDEPENDENT_AMBULATORY_CARE_PROVIDER_SITE_OTHER): Payer: Self-pay | Admitting: Family Medicine

## 2022-09-30 ENCOUNTER — Encounter (INDEPENDENT_AMBULATORY_CARE_PROVIDER_SITE_OTHER): Payer: Self-pay

## 2022-09-30 NOTE — Progress Notes (Signed)
Chief Complaint:   OBESITY Alicia Knapp is here to discuss her progress with her obesity treatment plan along with follow-up of her obesity related diagnoses. Alicia Knapp is on the [employer] +200 and states she is following her eating plan approximately 50% of the time. Alicia Knapp states she is exercising 30 minutes 2 times per week.  Today's visit was #: 67 Starting weight: 212 lbs Starting date: 08/03/2018 Today's weight: 194 lbs Today's date: 09/17/2022 Total lbs lost to date: 18 lbs Total lbs lost since last in-office visit: 0  Interim History: Alicia Knapp feels somewhat frustrated with lack of weight loss. She recognizes that she really struggles with snacking during the day and cravings. She is hosting Thanksgiving dinner for her family and her family will be present for 5 days.   Subjective:   1. Elevated random blood glucose level Alicia Knapp's blood sugar elevated on prior labs-- no real improvement on Metformin. Her A1c was 5.5 (previously went down to 5.1).  2. Vitamin D deficiency Alicia Knapp is currently taking prescription Vit D 50,000 IU once a week. Denies any nausea, vomiting or muscle weakness. She notes fatigue.  Assessment/Plan:   1. Elevated random blood glucose level START Ozempic 0.25 mg SubQ once weekly for 1 month with 0 refills. We will refill Metformin 500 mg twice daily for 1 month with 0 refills.  -Start Semaglutide,0.25 or 0.'5MG'$ /DOS, (OZEMPIC, 0.25 OR 0.5 MG/DOSE,) 2 MG/3ML SOPN; Inject 0.25 mg into the skin once a week.  Dispense: 3 mL; Refill: 0  -Refill metFORMIN (GLUCOPHAGE) 500 MG tablet; Take 1 tablet (500 mg total) by mouth 2 (two) times daily with a meal.  Dispense: 180 tablet; Refill: 0  2. Vitamin D deficiency We will refill Vit D 50K IU once a week for 1 month with 0 refills.  -Refill Vitamin D, Ergocalciferol, (DRISDOL) 1.25 MG (50000 UNIT) CAPS capsule; Take 1 capsule (50,000 Units total) by mouth every 7 (seven) days.  Dispense: 12 capsule; Refill: 0  3. Obesity  with current BMI of 31.3 Alicia Knapp is currently in the action stage of change. As such, her goal is to continue with weight loss efforts. She has agreed to the [place] +200.  Exercise goals: No exercise has been prescribed at this time.  Behavioral modification strategies: increasing lean protein intake, meal planning and cooking strategies, keeping healthy foods in the home, and planning for success.  Alicia Knapp has agreed to follow-up with our clinic in 4 weeks. She was informed of the importance of frequent follow-up visits to maximize her success with intensive lifestyle modifications for her multiple health conditions.   Objective:   Blood pressure 137/80, pulse 67, temperature 98.6 F (37 C), height '5\' 6"'$  (1.676 m), weight 194 lb (88 kg), SpO2 100 %. Body mass index is 31.31 kg/m.  General: Cooperative, alert, well developed, in no acute distress. HEENT: Conjunctivae and lids unremarkable. Cardiovascular: Regular rhythm.  Lungs: Normal work of breathing. Neurologic: No focal deficits.   Lab Results  Component Value Date   CREATININE 0.80 07/22/2022   BUN 15 07/22/2022   NA 144 07/22/2022   K 4.9 07/22/2022   CL 105 07/22/2022   CO2 24 07/22/2022   Lab Results  Component Value Date   ALT 26 07/22/2022   AST 16 07/22/2022   ALKPHOS 81 07/22/2022   BILITOT 0.3 07/22/2022   Lab Results  Component Value Date   HGBA1C 5.5 07/22/2022   HGBA1C 5.5 04/01/2022   HGBA1C 5.5 10/01/2021   HGBA1C 5.3  12/25/2020   HGBA1C 5.3 08/08/2020   Lab Results  Component Value Date   INSULIN 19.3 07/22/2022   INSULIN 11.2 12/25/2020   INSULIN 20.6 08/08/2020   INSULIN 10.4 08/16/2019   INSULIN 20.3 04/08/2019   Lab Results  Component Value Date   TSH 1.63 10/01/2021   Lab Results  Component Value Date   CHOL 166 07/22/2022   HDL 54 07/22/2022   LDLCALC 95 07/22/2022   LDLDIRECT 140.5 08/12/2013   TRIG 92 07/22/2022   CHOLHDL 4 04/01/2022   Lab Results  Component Value  Date   VD25OH 43.2 07/22/2022   VD25OH 35.71 04/01/2022   VD25OH 36.62 10/01/2021   Lab Results  Component Value Date   WBC 3.4 (L) 10/01/2021   HGB 11.9 (L) 10/01/2021   HCT 36.5 10/01/2021   MCV 86.9 10/01/2021   PLT 340.0 10/01/2021   No results found for: "IRON", "TIBC", "FERRITIN"  Attestation Statements:   Reviewed by clinician on day of visit: allergies, medications, problem list, medical history, surgical history, family history, social history, and previous encounter notes.  I, Elnora Morrison, RMA am acting as transcriptionist for Coralie Common, MD.  I have reviewed the above documentation for accuracy and completeness, and I agree with the above. - Coralie Common, MD

## 2022-10-01 ENCOUNTER — Encounter (INDEPENDENT_AMBULATORY_CARE_PROVIDER_SITE_OTHER): Payer: Self-pay | Admitting: Family Medicine

## 2022-10-03 ENCOUNTER — Ambulatory Visit (INDEPENDENT_AMBULATORY_CARE_PROVIDER_SITE_OTHER): Admitting: Family Medicine

## 2022-10-03 ENCOUNTER — Encounter: Payer: Self-pay | Admitting: Family Medicine

## 2022-10-03 VITALS — BP 136/78 | HR 62 | Temp 97.9°F | Resp 18 | Ht 66.0 in | Wt 201.2 lb

## 2022-10-03 DIAGNOSIS — J302 Other seasonal allergic rhinitis: Secondary | ICD-10-CM

## 2022-10-03 DIAGNOSIS — R140 Abdominal distension (gaseous): Secondary | ICD-10-CM | POA: Diagnosis not present

## 2022-10-03 DIAGNOSIS — E785 Hyperlipidemia, unspecified: Secondary | ICD-10-CM | POA: Diagnosis not present

## 2022-10-03 DIAGNOSIS — Z0000 Encounter for general adult medical examination without abnormal findings: Secondary | ICD-10-CM | POA: Insufficient documentation

## 2022-10-03 DIAGNOSIS — I10 Essential (primary) hypertension: Secondary | ICD-10-CM | POA: Diagnosis not present

## 2022-10-03 DIAGNOSIS — K635 Polyp of colon: Secondary | ICD-10-CM

## 2022-10-03 MED ORDER — LEVOCETIRIZINE DIHYDROCHLORIDE 5 MG PO TABS: 5.0000 mg | ORAL_TABLET | Freq: Every evening | ORAL | 3 refills | Status: DC

## 2022-10-03 MED ORDER — LOSARTAN POTASSIUM 50 MG PO TABS: 50.0000 mg | ORAL_TABLET | Freq: Every day | ORAL | 3 refills | Status: DC

## 2022-10-03 MED ORDER — AMLODIPINE BESYLATE 10 MG PO TABS: 10.0000 mg | ORAL_TABLET | Freq: Every day | ORAL | 3 refills | Status: DC

## 2022-10-03 MED ORDER — CARVEDILOL 6.25 MG PO TABS: 6.2500 mg | ORAL_TABLET | Freq: Two times a day (BID) | ORAL | 3 refills | Status: DC

## 2022-10-03 MED ORDER — HYDROCHLOROTHIAZIDE 25 MG PO TABS: 25.0000 mg | ORAL_TABLET | Freq: Every day | ORAL | 3 refills | Status: DC

## 2022-10-03 MED ORDER — ATORVASTATIN CALCIUM 10 MG PO TABS: 10.0000 mg | ORAL_TABLET | Freq: Every day | ORAL | 3 refills | Status: DC

## 2022-10-03 MED ORDER — FLUTICASONE PROPIONATE 50 MCG/ACT NA SUSP: 2.0000 | Freq: Every day | NASAL | 3 refills | Status: DC

## 2022-10-03 NOTE — Progress Notes (Signed)
Subjective:   By signing my name below, I, Carylon Perches, attest that this documentation has been prepared under the direction and in the presence of Ann Held DO 10/03/2022   Patient ID: Alicia Knapp, female    DOB: 12-06-1955, 66 y.o.   MRN: [id number]  Chief Complaint  Patient presents with   Annual Exam    Pt states not fasting     HPI Patient is in today for a comprehensive physical exam  She is requesting a refill of all of her medications.  She states that at this current moment, she is feeling slightly congested.   She is currently being seen by [facility]. She states that she has not tried Ozempic yet.  Wt Readings from Last 3 Encounters:  10/03/22 201 lb 3.2 oz (91.3 kg)  09/17/22 194 lb (88 kg)  08/18/22 194 lb (88 kg)   She is requesting to get her ears flushed.  She complains of feeling bloated and states that she is constipated on and off. She states that her bloating symptoms occurred about a couple of weeks ago. She is currently taking Miralax about twice a week. She states that even though she is taking Miralax, she still feels gassy. She is inquiring about whether there's anything else to take for her symptoms. She denies of any abdominal pain.   She is asking if she is going through menopause. She states that her last cycle was years ago. She does not see a Ob/gyn.   She denies having any fever, new muscle pain, joint pain , new moles, sinus pain, sore throat, chest pain, palpations, cough, SOB ,wheezing,n/v/d constipation, blood in stool, dysuria, frequency, hematuria, at this time  She reports no recent surgeries. She denies of any changes to her family medical history. She states that she is continuing not to smoke or drink. Colonoscopy was last completed on 06/28/2018.  Dexa was last completed on 06/17/2021 Mammogram was last completed on 01/07/2021. She [facility].  She  reports that she is UTD on her influenza and covid vaccines.  She states that she snacks frequently.  She is regularly exercising about five days a week.  She is UTD on dental exams. She is UTD on vision exams.   Past Medical History:  Diagnosis Date   Anxiety    B12 deficiency    Constipation    Heart murmur    Hyperlipidemia    Hypertension    Joint pain    Postoperative nausea    Prediabetes    Swelling    lower ext   Vitamin D deficiency     Past Surgical History:  Procedure Laterality Date   ABDOMINAL HYSTERECTOMY     APPENDECTOMY     COLONOSCOPY  05/22/2015   Dr.Pyrtle   POLYPECTOMY      Family History  Problem Relation Age of Onset   Cancer Father        prostate   Prostate cancer Father    Diabetes Mother    Thyroid disease Mother    Hypertension Mother    Hyperlipidemia Mother    Kidney disease Mother    Cancer Mother    Sleep apnea Mother    Obesity Mother    Hypertension Sister    Hypertension Brother    Stroke Brother    Colon cancer Brother 62       died 68 - 4-5 months after  dx   Arthritis Maternal Grandmother    Esophageal cancer Neg Hx    Rectal cancer Neg Hx    Stomach cancer Neg Hx     Social History   Socioeconomic History   Marital status: Married    Spouse name: [name]   Number of children: Not on file   Years of education: Not on file   Highest education level: Not on file  Occupational History   Occupation: Stay at home spouse  Tobacco Use   Smoking status: Former    Types: Cigarettes    Quit date: 09/17/2014    Years since quitting: 8.0   Smokeless tobacco: Never  Vaping Use   Vaping Use: Never used  Substance and Sexual Activity   Alcohol use: No    Alcohol/week: 0.0 standard drinks of alcohol   Drug use: No   Sexual activity: Yes    Partners: Male  Other Topics Concern   Not on file  Social History Narrative   Lives in house with her husband   Social Determinants of Health   Financial Resource Strain:  Not on file  Food Insecurity: Not on file  Transportation Needs: Not on file  Physical Activity: Not on file  Stress: Not on file  Social Connections: Not on file  Intimate Partner Violence: Not on file    Outpatient Medications Prior to Visit  Medication Sig Dispense Refill   ARIPiprazole (ABILIFY) 2 MG tablet Take 2 mg by mouth daily.      ARIPiprazole (ABILIFY) 5 MG tablet Take 1 tablet by mouth daily.  0   benzonatate (TESSALON PERLES) 100 MG capsule Take 2 capsules (200 mg total) by mouth 3 (three) times daily as needed for cough. 40 capsule 0   metFORMIN (GLUCOPHAGE) 500 MG tablet Take 1 tablet (500 mg total) by mouth 2 (two) times daily with a meal. 180 tablet 0   Semaglutide,0.25 or 0.5MG/DOS, (OZEMPIC, 0.25 OR 0.5 MG/DOSE,) 2 MG/3ML SOPN Inject 0.25 mg into the skin once a week. 3 mL 0   triamcinolone cream (KENALOG) 0.1 % Apply 1 application topically 2 (two) times daily. 30 g 0   Vitamin D, Ergocalciferol, (DRISDOL) 1.25 MG (50000 UNIT) CAPS capsule Take 1 capsule (50,000 Units total) by mouth every 7 (seven) days. 12 capsule 0   amLODipine (NORVASC) 10 MG tablet Take 1 tablet (10 mg total) by mouth daily. 90 tablet 3   atorvastatin (LIPITOR) 10 MG tablet Take 1 tablet (10 mg total) by mouth daily. 90 tablet 3   carvedilol (COREG) 6.25 MG tablet Take 1 tablet (6.25 mg total) by mouth 2 (two) times daily with a meal. 180 tablet 3   fluticasone (FLONASE) 50 MCG/ACT nasal spray USE 2 SPRAYS IN EACH NOSTRIL DAILY 48 g 3   hydrochlorothiazide (HYDRODIURIL) 25 MG tablet Take 1 tablet (25 mg total) by mouth daily. 90 tablet 3   levocetirizine (XYZAL) 5 MG tablet Take 1 tablet (5 mg total) by mouth every evening. 90 tablet 3   losartan (COZAAR) 50 MG tablet Take 1 tablet (50 mg total) by mouth daily. 90 tablet 3   No facility-administered medications prior to visit.    Allergies  Allergen Reactions   Erythromycin     Causes stomach cramps   Penicillins     rash    Review of  Systems  Constitutional:  Negative for chills, fever and malaise/fatigue.  HENT:  Positive for congestion. Negative for hearing loss, sinus pain and sore throat.  Eyes:  Negative for discharge.  Respiratory:  Negative for cough, sputum production, shortness of breath and wheezing.   Cardiovascular:  Negative for chest pain, palpitations and leg swelling.  Gastrointestinal:  Positive for constipation. Negative for abdominal pain, blood in stool, diarrhea, heartburn, nausea and vomiting.       (+) Flatulence  Genitourinary:  Negative for dysuria, frequency, hematuria and urgency.  Musculoskeletal:  Negative for back pain, falls, joint pain and myalgias.  Skin:  Negative for rash.       (-) [place]  Neurological:  Negative for dizziness, sensory change, loss of consciousness, weakness and headaches.  Endo/Heme/Allergies:  Negative for environmental allergies. Does not bruise/bleed easily.  Psychiatric/Behavioral:  Negative for depression and suicidal ideas. The patient is not nervous/anxious and does not have insomnia.        Objective:    Physical Exam Vitals and nursing note reviewed.  Constitutional:      General: She is not in acute distress.    Appearance: Normal appearance. She is not ill-appearing.  HENT:     Head: Normocephalic and atraumatic.     Right Ear: Tympanic membrane, ear canal and external ear normal. There is impacted cerumen.     Left Ear: Tympanic membrane, ear canal and external ear normal. There is impacted cerumen.     Nose: Nose normal.     Mouth/Throat:     Mouth: Mucous membranes are moist.     Pharynx: Oropharynx is clear.  Eyes:     Extraocular Movements: Extraocular movements intact.     Conjunctiva/sclera: Conjunctivae normal.     Pupils: Pupils are equal, round, and reactive to light.  Cardiovascular:     Rate and Rhythm: Normal rate and regular rhythm.     Heart sounds: Normal heart sounds. No murmur heard.    No gallop.  Pulmonary:      Effort: Pulmonary effort is normal. No respiratory distress.     Breath sounds: Normal breath sounds. No wheezing or rales.  Abdominal:     General: Bowel sounds are normal. There is no distension.     Palpations: Abdomen is soft.     Tenderness: There is no abdominal tenderness. There is no guarding.  Musculoskeletal:        General: Normal range of motion.     Cervical back: Normal range of motion and neck supple.  Skin:    General: Skin is warm and dry.     Findings: No lesion or rash.  Neurological:     General: No focal deficit present.     Mental Status: She is alert and oriented to person, place, and time.     Motor: No weakness.     Gait: Gait normal.  Psychiatric:        Mood and Affect: Mood normal.        Behavior: Behavior normal.        Thought Content: Thought content normal.        Judgment: Judgment normal.     BP 136/78 (BP Location: Left Arm, Patient Position: Sitting, Cuff Size: Normal)   Pulse 62   Temp 97.9 F (36.6 C) (Oral)   Resp 18   Ht _0  (1.676 m)   Wt 201 lb 3.2 oz (91.3 kg)   SpO2 98%   BMI 32.47 kg/m  Wt Readings from Last 3 Encounters:  10/03/22 201 lb 3.2 oz (91.3 kg)  09/17/22 194 lb (88 kg)  08/18/22 194 lb (88 kg)  Diabetic Foot Exam - Simple   No data filed    Lab Results  Component Value Date   WBC 3.4 (L) 10/01/2021   HGB 11.9 (L) 10/01/2021   HCT 36.5 10/01/2021   PLT 340.0 10/01/2021   GLUCOSE 94 07/22/2022   CHOL 166 07/22/2022   TRIG 92 07/22/2022   HDL 54 07/22/2022   LDLDIRECT 140.5 08/12/2013   LDLCALC 95 07/22/2022   ALT 26 07/22/2022   AST 16 07/22/2022   NA 144 07/22/2022   K 4.9 07/22/2022   CL 105 07/22/2022   CREATININE 0.80 07/22/2022   BUN 15 07/22/2022   CO2 24 07/22/2022   TSH 1.63 10/01/2021   HGBA1C 5.5 07/22/2022   MICROALBUR <0.7 02/22/2015    Lab Results  Component Value Date   TSH 1.63 10/01/2021   Lab Results  Component Value Date   WBC 3.4 (L) 10/01/2021   HGB 11.9 (L)  10/01/2021   HCT 36.5 10/01/2021   MCV 86.9 10/01/2021   PLT 340.0 10/01/2021   Lab Results  Component Value Date   NA 144 07/22/2022   K 4.9 07/22/2022   CO2 24 07/22/2022   GLUCOSE 94 07/22/2022   BUN 15 07/22/2022   CREATININE 0.80 07/22/2022   BILITOT 0.3 07/22/2022   ALKPHOS 81 07/22/2022   AST 16 07/22/2022   ALT 26 07/22/2022   PROT 7.5 07/22/2022   ALBUMIN 4.6 07/22/2022   CALCIUM 9.7 07/22/2022   EGFR 82 07/22/2022   GFR 82.89 04/01/2022   Lab Results  Component Value Date   CHOL 166 07/22/2022   Lab Results  Component Value Date   HDL 54 07/22/2022   Lab Results  Component Value Date   LDLCALC 95 07/22/2022   Lab Results  Component Value Date   TRIG 92 07/22/2022   Lab Results  Component Value Date   CHOLHDL 4 04/01/2022   Lab Results  Component Value Date   HGBA1C 5.5 07/22/2022       Assessment & Plan:   Problem List Items Addressed This Visit       Unprioritized   Preventative health care - Primary    Ghm utd Check labs  See avs      Hyperlipidemia    Encourage heart healthy diet such as MIND or DASH diet, increase exercise, avoid trans fats, simple carbohydrates and processed foods, consider a krill or fish or flaxseed oil cap daily.        Relevant Medications   amLODipine (NORVASC) 10 MG tablet   atorvastatin (LIPITOR) 10 MG tablet   carvedilol (COREG) 6.25 MG tablet   hydrochlorothiazide (HYDRODIURIL) 25 MG tablet   losartan (COZAAR) 50 MG tablet   Essential hypertension    Well controlled, no changes to meds. Encouraged heart healthy diet such as the DASH diet and exercise as tolerated.        Relevant Medications   amLODipine (NORVASC) 10 MG tablet   atorvastatin (LIPITOR) 10 MG tablet   carvedilol (COREG) 6.25 MG tablet   hydrochlorothiazide (HYDRODIURIL) 25 MG tablet   losartan (COZAAR) 50 MG tablet   Other Visit Diagnoses     Seasonal allergic rhinitis       Relevant Medications   fluticasone (FLONASE) 50  MCG/ACT nasal spray   levocetirizine (XYZAL) 5 MG tablet   Polyp of colon, unspecified part of colon, unspecified type       Relevant Orders   Ambulatory referral to Gastroenterology   Abdominal bloating  Relevant Orders   US Pelvic Complete With Transvaginal   US Abdomen Complete      Meds ordered this encounter  Medications   amLODipine (NORVASC) 10 MG tablet    Sig: Take 1 tablet (10 mg total) by mouth daily.    Dispense:  90 tablet    Refill:  3   atorvastatin (LIPITOR) 10 MG tablet    Sig: Take 1 tablet (10 mg total) by mouth daily.    Dispense:  90 tablet    Refill:  3   carvedilol (COREG) 6.25 MG tablet    Sig: Take 1 tablet (6.25 mg total) by mouth 2 (two) times daily with a meal.    Dispense:  180 tablet    Refill:  3   fluticasone (FLONASE) 50 MCG/ACT nasal spray    Sig: Place 2 sprays into both nostrils daily.    Dispense:  48 g    Refill:  3   hydrochlorothiazide (HYDRODIURIL) 25 MG tablet    Sig: Take 1 tablet (25 mg total) by mouth daily.    Dispense:  90 tablet    Refill:  3   levocetirizine (XYZAL) 5 MG tablet    Sig: Take 1 tablet (5 mg total) by mouth every evening.    Dispense:  90 tablet    Refill:  3   losartan (COZAAR) 50 MG tablet    Sig: Take 1 tablet (50 mg total) by mouth daily.    Dispense:  90 tablet    Refill:  3    I, Ann Held, DO, personally preformed the services described in this documentation.  All medical record entries made by the scribe were at my direction and in my presence.  I have reviewed the chart and discharge instructions (if applicable) and agree that the record reflects my personal performance and is accurate and complete. 10/03/2022   I,Amber Collins,acting as a scribe for Ann Held, DO.,have documented all relevant documentation on the behalf of Ann Held, DO,as directed by  Ann Held, DO while in the presence of Ann Held, DO.    Ann Held,  DO

## 2022-10-03 NOTE — Assessment & Plan Note (Signed)
Encourage heart healthy diet such as MIND or DASH diet, increase exercise, avoid trans fats, simple carbohydrates and processed foods, consider a krill or fish or flaxseed oil cap daily.  °

## 2022-10-03 NOTE — Assessment & Plan Note (Signed)
Well controlled, no changes to meds. Encouraged heart healthy diet such as the DASH diet and exercise as tolerated.  °

## 2022-10-03 NOTE — Patient Instructions (Signed)
Preventive Care 40-64 Years Old, Female Preventive care refers to lifestyle choices and visits with your health care provider that can promote health and wellness. Preventive care visits are also called wellness exams. What can I expect for my preventive care visit? Counseling Your health care provider may ask you questions about your: Medical history, including: Past medical problems. Family medical history. Pregnancy history. Current health, including: Menstrual cycle. Method of birth control. Emotional well-being. Home life and relationship well-being. Sexual activity and sexual health. Lifestyle, including: Alcohol, nicotine or tobacco, and drug use. Access to firearms. Diet, exercise, and sleep habits. Work and work environment. Sunscreen use. Safety issues such as seatbelt and bike helmet use. Physical exam Your health care provider will check your: Height and weight. These may be used to calculate your BMI (body mass index). BMI is a measurement that tells if you are at a healthy weight. Waist circumference. This measures the distance around your waistline. This measurement also tells if you are at a healthy weight and may help predict your risk of certain diseases, such as type 2 diabetes and high blood pressure. Heart rate and blood pressure. Body temperature. Skin for abnormal spots. What immunizations do I need?  Vaccines are usually given at various ages, according to a schedule. Your health care provider will recommend vaccines for you based on your age, medical history, and lifestyle or other factors, such as travel or where you work. What tests do I need? Screening Your health care provider may recommend screening tests for certain conditions. This may include: Lipid and cholesterol levels. Diabetes screening. This is done by checking your blood sugar (glucose) after you have not eaten for a while (fasting). Pelvic exam and Pap test. Hepatitis B test. Hepatitis C  test. HIV (human immunodeficiency virus) test. STI (sexually transmitted infection) testing, if you are at risk. Lung cancer screening. Colorectal cancer screening. Mammogram. Talk with your health care provider about when you should start having regular mammograms. This may depend on whether you have a family history of breast cancer. BRCA-related cancer screening. This may be done if you have a family history of breast, ovarian, tubal, or peritoneal cancers. Bone density scan. This is done to screen for osteoporosis. Talk with your health care provider about your test results, treatment options, and if necessary, the need for more tests. Follow these instructions at home: Eating and drinking  Eat a diet that includes fresh fruits and vegetables, whole grains, lean protein, and low-fat dairy products. Take vitamin and mineral supplements as recommended by your health care provider. Do not drink alcohol if: Your health care provider tells you not to drink. You are pregnant, may be pregnant, or are planning to become pregnant. If you drink alcohol: Limit how much you have to 0-1 drink a day. Know how much alcohol is in your drink. In the [place] one drink equals one 12 oz bottle of beer (355 mL), one 5 oz glass of wine (148 mL), or one 1 oz glass of hard liquor (44 mL). Lifestyle Brush your teeth every morning and night with fluoride toothpaste. Floss one time each day. Exercise for at least 30 minutes 5 or more days each week. Do not use any products that contain nicotine or tobacco. These products include cigarettes, chewing tobacco, and vaping devices, such as e-cigarettes. If you need help quitting, ask your health care provider. Do not use drugs. If you are sexually active, practice safe sex. Use a condom or other form of protection to   prevent STIs. If you do not wish to become pregnant, use a form of birth control. If you plan to become pregnant, see your health care provider for a  prepregnancy visit. Take aspirin only as told by your health care provider. Make sure that you understand how much to take and what form to take. Work with your health care provider to find out whether it is safe and beneficial for you to take aspirin daily. Find healthy ways to manage stress, such as: Meditation, yoga, or listening to music. Journaling. Talking to a trusted person. Spending time with friends and family. Minimize exposure to UV radiation to reduce your risk of skin cancer. Safety Always wear your seat belt while driving or riding in a vehicle. Do not drive: If you have been drinking alcohol. Do not ride with someone who has been drinking. When you are tired or distracted. While texting. If you have been using any mind-altering substances or drugs. Wear a helmet and other protective equipment during sports activities. If you have firearms in your house, make sure you follow all gun safety procedures. Seek help if you have been physically or sexually abused. What's next? Visit your health care provider once a year for an annual wellness visit. Ask your health care provider how often you should have your eyes and teeth checked. Stay up to date on all vaccines. This information is not intended to replace advice given to you by your health care provider. Make sure you discuss any questions you have with your health care provider. Document Revised: 04/17/2021 Document Reviewed: 04/17/2021 Elsevier Patient Education  [place].

## 2022-10-03 NOTE — Assessment & Plan Note (Signed)
Ghm utd Check labs  See avs 

## 2022-10-03 NOTE — Assessment & Plan Note (Signed)
Con't HWW 

## 2022-10-06 ENCOUNTER — Other Ambulatory Visit (HOSPITAL_BASED_OUTPATIENT_CLINIC_OR_DEPARTMENT_OTHER)

## 2022-10-08 ENCOUNTER — Encounter (INDEPENDENT_AMBULATORY_CARE_PROVIDER_SITE_OTHER): Payer: Self-pay

## 2022-10-10 ENCOUNTER — Ambulatory Visit (HOSPITAL_BASED_OUTPATIENT_CLINIC_OR_DEPARTMENT_OTHER)
Admission: RE | Admit: 2022-10-10 | Discharge: 2022-10-10 | Disposition: A | Discharge status: Home / Self Care | Source: Ambulatory Visit | Attending: Family Medicine | Admitting: Family Medicine

## 2022-10-10 ENCOUNTER — Encounter: Payer: Self-pay | Admitting: Family Medicine

## 2022-10-10 ENCOUNTER — Ambulatory Visit (INDEPENDENT_AMBULATORY_CARE_PROVIDER_SITE_OTHER): Admitting: Family Medicine

## 2022-10-10 VITALS — BP 130/68 | HR 67 | Temp 98.5°F | Resp 18 | Ht 66.0 in | Wt 200.2 lb

## 2022-10-10 DIAGNOSIS — H6123 Impacted cerumen, bilateral: Secondary | ICD-10-CM

## 2022-10-10 DIAGNOSIS — R140 Abdominal distension (gaseous): Secondary | ICD-10-CM | POA: Diagnosis present

## 2022-10-10 DIAGNOSIS — H6122 Impacted cerumen, left ear: Secondary | ICD-10-CM | POA: Insufficient documentation

## 2022-10-10 NOTE — Assessment & Plan Note (Signed)
Irrigated with no complications

## 2022-10-10 NOTE — Progress Notes (Signed)
New Patient Office Visit  Subjective    Patient ID: Alicia Knapp, female    DOB: 07-31-1956  Age: 66 y.o. MRN: [id number]  CC:  Chief Complaint  Patient presents with   Cerumen Impaction    Bilateral ears    HPI [name] presents for Cerumen removal b/l ears.   No other complaints   Outpatient Encounter Medications as of 10/10/2022  Medication Sig   amLODipine (NORVASC) 10 MG tablet Take 1 tablet (10 mg total) by mouth daily.   ARIPiprazole (ABILIFY) 2 MG tablet Take 2 mg by mouth daily.    ARIPiprazole (ABILIFY) 5 MG tablet Take 1 tablet by mouth daily.   atorvastatin (LIPITOR) 10 MG tablet Take 1 tablet (10 mg total) by mouth daily.   benzonatate (TESSALON PERLES) 100 MG capsule Take 2 capsules (200 mg total) by mouth 3 (three) times daily as needed for cough.   carvedilol (COREG) 6.25 MG tablet Take 1 tablet (6.25 mg total) by mouth 2 (two) times daily with a meal.   fluticasone (FLONASE) 50 MCG/ACT nasal spray Place 2 sprays into both nostrils daily.   hydrochlorothiazide (HYDRODIURIL) 25 MG tablet Take 1 tablet (25 mg total) by mouth daily.   levocetirizine (XYZAL) 5 MG tablet Take 1 tablet (5 mg total) by mouth every evening.   losartan (COZAAR) 50 MG tablet Take 1 tablet (50 mg total) by mouth daily.   metFORMIN (GLUCOPHAGE) 500 MG tablet Take 1 tablet (500 mg total) by mouth 2 (two) times daily with a meal.   Semaglutide,0.25 or 0.'5MG'$ /DOS, (OZEMPIC, 0.25 OR 0.5 MG/DOSE,) 2 MG/3ML SOPN Inject 0.25 mg into the skin once a week.   triamcinolone cream (KENALOG) 0.1 % Apply 1 application topically 2 (two) times daily.   Vitamin D, Ergocalciferol, (DRISDOL) 1.25 MG (50000 UNIT) CAPS capsule Take 1 capsule (50,000 Units total) by mouth every 7 (seven) days.   No facility-administered encounter medications on file as of 10/10/2022.    Past Medical History:  Diagnosis Date   Anxiety    B12 deficiency    Constipation    Heart murmur    Hyperlipidemia    Hypertension     Joint pain    Postoperative nausea    Prediabetes    Swelling    lower ext   Vitamin D deficiency     Past Surgical History:  Procedure Laterality Date   ABDOMINAL HYSTERECTOMY     APPENDECTOMY     COLONOSCOPY  05/22/2015   Dr.Pyrtle   POLYPECTOMY      Family History  Problem Relation Age of Onset   Cancer Father        prostate   Prostate cancer Father    Diabetes Mother    Thyroid disease Mother    Hypertension Mother    Hyperlipidemia Mother    Kidney disease Mother    Cancer Mother    Sleep apnea Mother    Obesity Mother    Hypertension Sister    Hypertension Brother    Stroke Brother    Colon cancer Brother 54       died 33 - 4-5 months after dx   Arthritis Maternal Grandmother    Esophageal cancer Neg Hx    Rectal cancer Neg Hx    Stomach cancer Neg Hx     Social History   Socioeconomic History   Marital status: Married    Spouse name: [name]   Number of children: Not on file   Years  of education: Not on file   Highest education level: Not on file  Occupational History   Occupation: Stay at home spouse  Tobacco Use   Smoking status: Former    Types: Cigarettes    Quit date: 09/17/2014    Years since quitting: 8.0   Smokeless tobacco: Never  Vaping Use   Vaping Use: Never used  Substance and Sexual Activity   Alcohol use: No    Alcohol/week: 0.0 standard drinks of alcohol   Drug use: No   Sexual activity: Yes    Partners: Male  Other Topics Concern   Not on file  Social History Narrative   Lives in house with her husband   Social Determinants of Health   Financial Resource Strain: Not on file  Food Insecurity: Not on file  Transportation Needs: Not on file  Physical Activity: Not on file  Stress: Not on file  Social Connections: Not on file  Intimate Partner Violence: Not on file    Review of Systems  Constitutional:  Negative for fever and malaise/fatigue.  HENT:  Negative for congestion.   Eyes:  Negative for blurred  vision.  Respiratory:  Negative for cough and shortness of breath.   Cardiovascular:  Negative for chest pain, palpitations and leg swelling.  Gastrointestinal:  Negative for vomiting.  Musculoskeletal:  Negative for back pain.  Skin:  Negative for rash.  Neurological:  Negative for loss of consciousness and headaches.        Objective    BP 130/68 (BP Location: Right Arm, Patient Position: Sitting, Cuff Size: Large)   Pulse 67   Temp 98.5 F (36.9 C) (Oral)   Resp 18   Ht '5\' 6"'$  (1.676 m)   Wt 200 lb 3.2 oz (90.8 kg)   SpO2 98%   BMI 32.31 kg/m   Physical Exam Vitals and nursing note reviewed.  Constitutional:      Appearance: She is well-developed.  HENT:     Head: Normocephalic and atraumatic.     Comments: +bl cerumen impaction------  pt used debrox for last several days Unable to use hoop---- ears irrigated with no complications Ear exam normal after     Right Ear: Tympanic membrane, ear canal and external ear normal.     Left Ear: Tympanic membrane, ear canal and external ear normal.  Eyes:     Conjunctiva/sclera: Conjunctivae normal.  Neck:     Thyroid: No thyromegaly.     Vascular: No carotid bruit or JVD.  Cardiovascular:     Rate and Rhythm: Normal rate and regular rhythm.     Heart sounds: Normal heart sounds. No murmur heard. Pulmonary:     Effort: Pulmonary effort is normal. No respiratory distress.     Breath sounds: Normal breath sounds. No wheezing or rales.  Chest:     Chest wall: No tenderness.  Musculoskeletal:     Cervical back: Normal range of motion and neck supple.  Neurological:     Mental Status: She is alert and oriented to person, place, and time.        Assessment & Plan:   Problem List Items Addressed This Visit   None   No follow-ups on file.   Ann Held, DO

## 2022-10-16 ENCOUNTER — Ambulatory Visit (INDEPENDENT_AMBULATORY_CARE_PROVIDER_SITE_OTHER): Admitting: Family Medicine

## 2022-10-16 ENCOUNTER — Encounter (INDEPENDENT_AMBULATORY_CARE_PROVIDER_SITE_OTHER): Payer: Self-pay | Admitting: Family Medicine

## 2022-10-16 VITALS — BP 118/62 | HR 62 | Temp 98.4°F | Ht 66.0 in | Wt 197.0 lb

## 2022-10-16 DIAGNOSIS — Z6831 Body mass index (BMI) 31.0-31.9, adult: Secondary | ICD-10-CM | POA: Diagnosis not present

## 2022-10-16 DIAGNOSIS — E559 Vitamin D deficiency, unspecified: Secondary | ICD-10-CM | POA: Diagnosis not present

## 2022-10-16 DIAGNOSIS — E669 Obesity, unspecified: Secondary | ICD-10-CM | POA: Diagnosis not present

## 2022-10-16 DIAGNOSIS — R7309 Other abnormal glucose: Secondary | ICD-10-CM

## 2022-10-16 MED ORDER — TRULICITY 0.75 MG/0.5ML ~~LOC~~ SOAJ: 0.7500 mg | SUBCUTANEOUS | 0 refills | Status: DC

## 2022-10-28 ENCOUNTER — Other Ambulatory Visit (INDEPENDENT_AMBULATORY_CARE_PROVIDER_SITE_OTHER): Payer: Self-pay | Admitting: Family Medicine

## 2022-10-28 ENCOUNTER — Encounter (INDEPENDENT_AMBULATORY_CARE_PROVIDER_SITE_OTHER): Payer: Self-pay | Admitting: Family Medicine

## 2022-10-28 DIAGNOSIS — R739 Hyperglycemia, unspecified: Secondary | ICD-10-CM

## 2022-10-28 DIAGNOSIS — R7309 Other abnormal glucose: Secondary | ICD-10-CM

## 2022-10-29 NOTE — Progress Notes (Signed)
Chief Complaint:   OBESITY Alicia Knapp is here to discuss her progress with her obesity treatment plan along with follow-up of her obesity related diagnoses. [name] is on the [employer] 200 and states she is following her eating plan approximately 50% of the time. [name] states she is exercising 30 minutes 1-2 times per week.  Today's visit was #: 50 Starting weight: 212 lbs Starting date: 08/03/2018 Today's weight: 197 lbs Today's date: 10/16/2022 Total lbs lost to date: 0 lbs Total lbs lost since last in-office visit: 0  Interim History: Patient had a great Thanksgiving with family that came into town.  Lots family out with lots of activity.  Has a small trip to [place] after the holiday.  Found out  Trulicity will be covered by insurance.  Subjective:   1. Elevated random blood glucose level Tokiko's insurance did not cover Ozempic but will cover Trulicity.  On Metformin with no improvement in hunger or cravings.  2. Vitamin D deficiency Alicia Knapp is currently taking prescription Vit D 50,000 IU once a week. Denies any nausea, vomiting or muscle weakness.  Assessment/Plan:   1. Elevated random blood glucose level Start Trulicity 4.74 mg SuibQ once weekly for 1 month with 0 refills.  -Start Dulaglutide (TRULICITY) 2.59 DG/3.8VF SOPN; Inject 0.75 mg into the skin once a week.  Dispense: 2 mL; Refill: 0  2. Vitamin D deficiency Continue Vit D weekly.  3. Obesity with current BMI of 31.9 Alicia Knapp is currently in the action stage of change. As such, her goal is to continue with weight loss efforts. She has agreed to the [place] +200.   Exercise goals: All adults should avoid inactivity. Some physical activity is better than none, and adults who participate in any amount of physical activity gain some health benefits.  Behavioral modification strategies: increasing lean protein intake, meal planning and cooking strategies, keeping healthy foods in the home, and planning for  success.  [name] has agreed to follow-up with our clinic in 4 weeks. She was informed of the importance of frequent follow-up visits to maximize her success with intensive lifestyle modifications for her multiple health conditions.   Objective:   Blood pressure 118/62, pulse 62, temperature 98.4 F (36.9 C), height '5\' 6"'$  (1.676 m), weight 197 lb (89.4 kg), SpO2 100 %. Body mass index is 31.8 kg/m.  General: Cooperative, alert, well developed, in no acute distress. HEENT: Conjunctivae and lids unremarkable. Cardiovascular: Regular rhythm.  Lungs: Normal work of breathing. Neurologic: No focal deficits.   Lab Results  Component Value Date   CREATININE 0.80 07/22/2022   BUN 15 07/22/2022   NA 144 07/22/2022   K 4.9 07/22/2022   CL 105 07/22/2022   CO2 24 07/22/2022   Lab Results  Component Value Date   ALT 26 07/22/2022   AST 16 07/22/2022   ALKPHOS 81 07/22/2022   BILITOT 0.3 07/22/2022   Lab Results  Component Value Date   HGBA1C 5.5 07/22/2022   HGBA1C 5.5 04/01/2022   HGBA1C 5.5 10/01/2021   HGBA1C 5.3 12/25/2020   HGBA1C 5.3 08/08/2020   Lab Results  Component Value Date   INSULIN 19.3 07/22/2022   INSULIN 11.2 12/25/2020   INSULIN 20.6 08/08/2020   INSULIN 10.4 08/16/2019   INSULIN 20.3 04/08/2019   Lab Results  Component Value Date   TSH 1.63 10/01/2021   Lab Results  Component Value Date   CHOL 166 07/22/2022   HDL 54 07/22/2022   [place] 95 07/22/2022  LDLDIRECT 140.5 08/12/2013   TRIG 92 07/22/2022   CHOLHDL 4 04/01/2022   Lab Results  Component Value Date   VD25OH 43.2 07/22/2022   VD25OH 35.71 04/01/2022   VD25OH 36.62 10/01/2021   Lab Results  Component Value Date   WBC 3.4 (L) 10/01/2021   HGB 11.9 (L) 10/01/2021   HCT 36.5 10/01/2021   MCV 86.9 10/01/2021   PLT 340.0 10/01/2021   No results found for: "IRON", "TIBC", "FERRITIN"  Attestation Statements:   Reviewed by clinician on day of visit: allergies, medications, problem  list, medical history, surgical history, family history, social history, and previous encounter notes.  I, Elnora Morrison, RMA am acting as transcriptionist for Coralie Common, MD.  I have reviewed the above documentation for accuracy and completeness, and I agree with the above. - Coralie Common, MD

## 2022-11-13 ENCOUNTER — Ambulatory Visit (INDEPENDENT_AMBULATORY_CARE_PROVIDER_SITE_OTHER): Admitting: Family Medicine

## 2022-11-13 ENCOUNTER — Encounter (INDEPENDENT_AMBULATORY_CARE_PROVIDER_SITE_OTHER): Payer: Self-pay | Admitting: Family Medicine

## 2022-11-13 VITALS — BP 147/74 | HR 75 | Temp 98.7°F | Ht 66.0 in | Wt 192.0 lb

## 2022-11-13 DIAGNOSIS — E669 Obesity, unspecified: Secondary | ICD-10-CM

## 2022-11-13 DIAGNOSIS — R7309 Other abnormal glucose: Secondary | ICD-10-CM

## 2022-11-13 DIAGNOSIS — Z6831 Body mass index (BMI) 31.0-31.9, adult: Secondary | ICD-10-CM | POA: Diagnosis not present

## 2022-11-13 DIAGNOSIS — I10 Essential (primary) hypertension: Secondary | ICD-10-CM

## 2022-11-13 MED ORDER — TRULICITY 0.75 MG/0.5ML ~~LOC~~ SOAJ: 0.7500 mg | SUBCUTANEOUS | 0 refills | Status: DC

## 2022-11-15 IMAGING — MG MM DIGITAL SCREENING BILAT W/ TOMO AND CAD
8 series · 8 of 24 positions shown · non-contrast
Comparison: Previous exam(s).

CLINICAL DATA: Screening.

EXAM:
DIGITAL SCREENING BILATERAL MAMMOGRAM WITH TOMOSYNTHESIS AND CAD
TECHNIQUE: Bilateral screening digital craniocaudal and mediolateral oblique
mammograms were obtained. Bilateral screening digital breast
tomosynthesis was performed. The images were evaluated with
computer-aided detection.

[R MLO synth-2D]
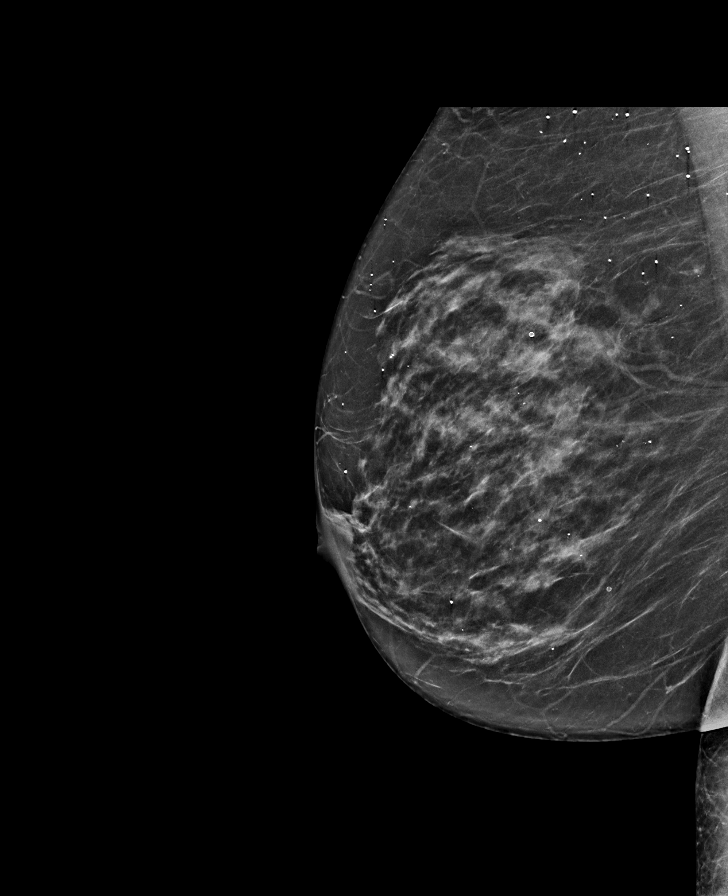

[L MLO synth-2D]
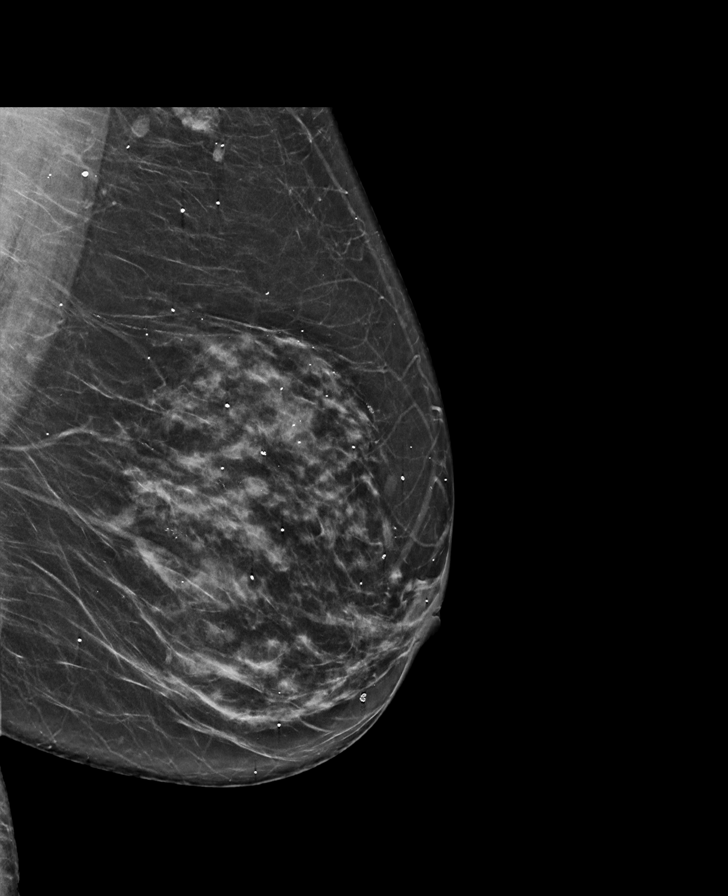

[L CC synth-2D]
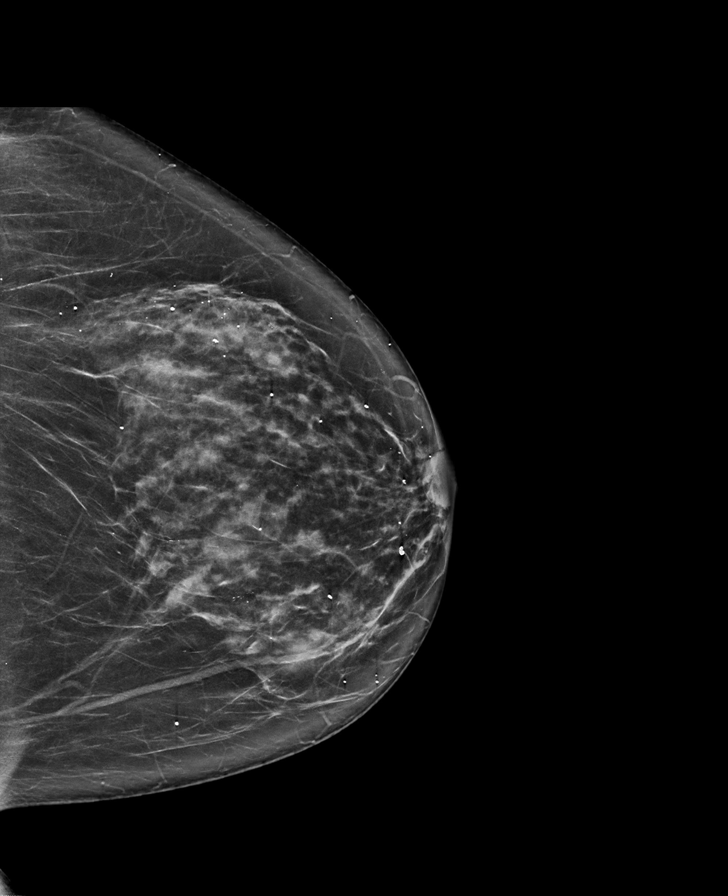

[R CC synth-2D]
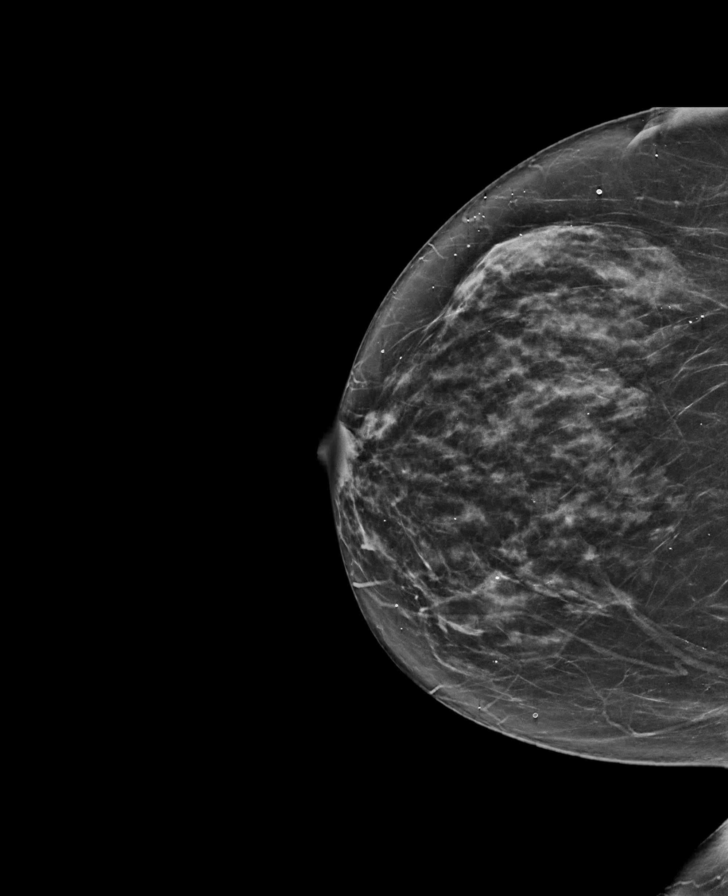

[L CC tomo · tomo slice 38/75.0]
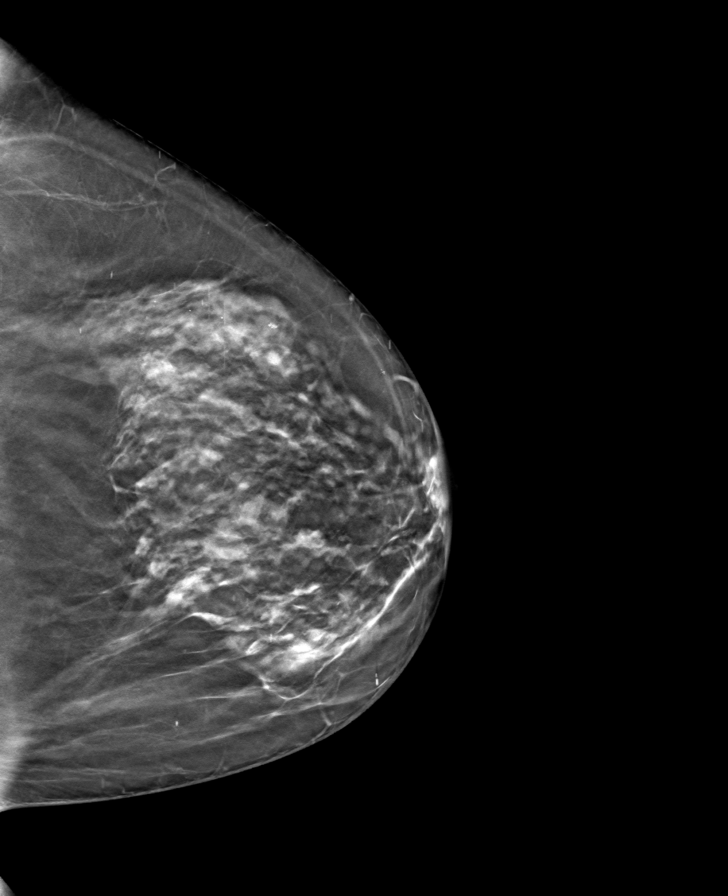

[R CC tomo · tomo slice 35/70.0]
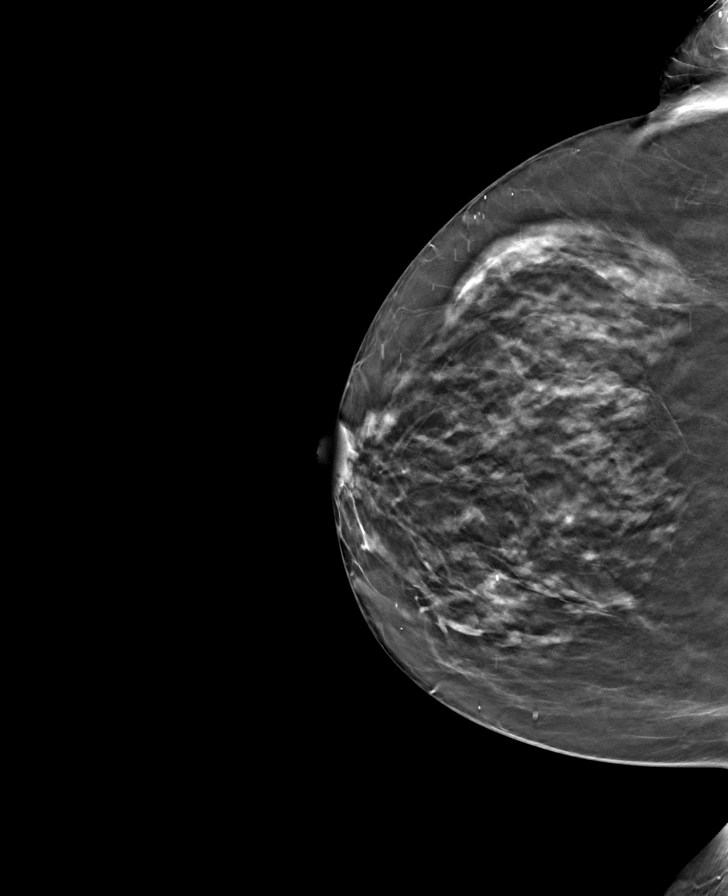

[L MLO tomo · tomo slice 37/74.0]
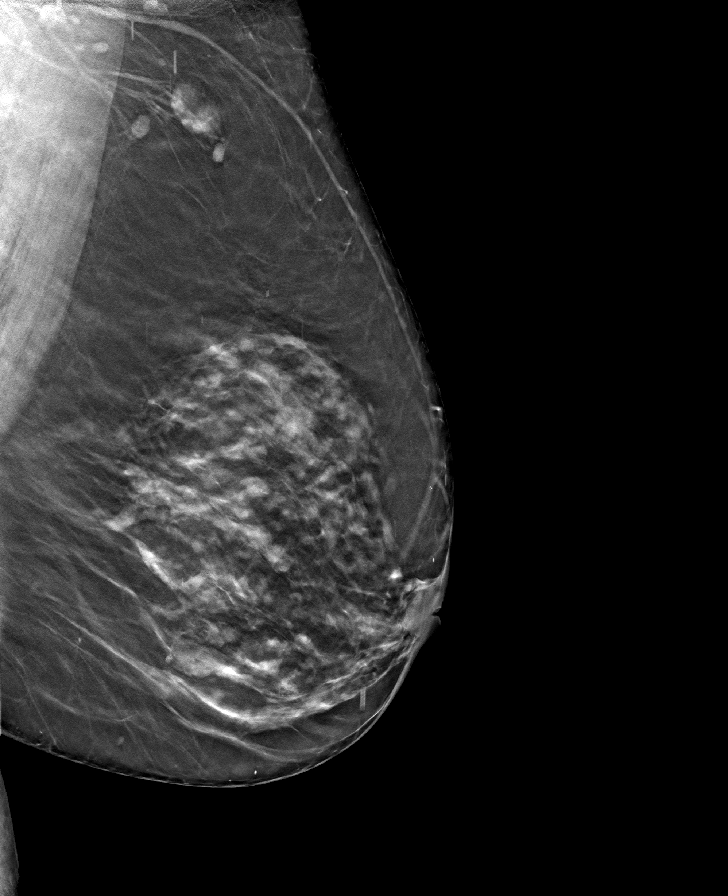

[R MLO tomo · tomo slice 36/71.0]
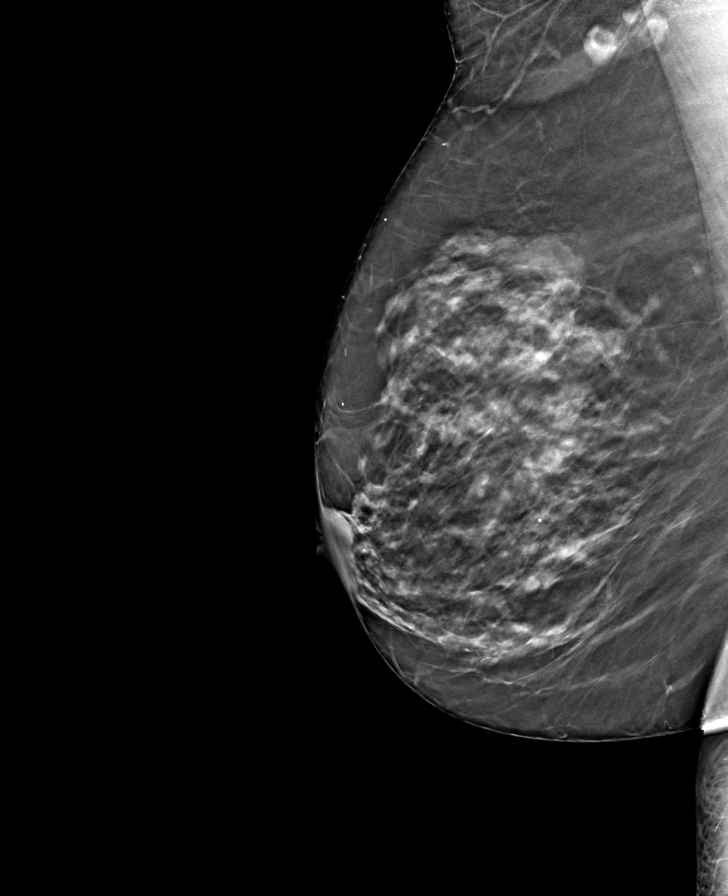

[8 of 24 positions shown; findings below may reference images not displayed]

ACR Breast Density Category c: The breast tissue is heterogeneously
dense, which may obscure small masses.
FINDINGS: There are no findings suspicious for malignancy. The images were
evaluated with computer-aided detection.
IMPRESSION: No mammographic evidence of malignancy. A result letter of this
screening mammogram will be mailed directly to the patient.

RECOMMENDATION:
Screening mammogram in one year. (Code:T4-5-GWO)

BI-RADS CATEGORY  1: Negative.

## 2022-11-19 ENCOUNTER — Other Ambulatory Visit (INDEPENDENT_AMBULATORY_CARE_PROVIDER_SITE_OTHER): Payer: Self-pay | Admitting: Family Medicine

## 2022-11-19 DIAGNOSIS — E559 Vitamin D deficiency, unspecified: Secondary | ICD-10-CM

## 2022-11-24 NOTE — Progress Notes (Signed)
Chief Complaint:   OBESITY Alicia Knapp is here to discuss her progress with her obesity treatment plan along with follow-up of her obesity related diagnoses. Normagene is on the [employer] +200 and states she is following her eating plan approximately 90-95% of the time. Blimi states she is strength training/cycling 30 minutes 2-3 times per week.  Today's visit was #: 3 Starting weight: 212 lbs Starting date: 08/03/2018 Today's weight: 192 lbs Today's date: 11/13/2022 Total lbs lost to date: 20 lbs Total lbs lost since last in-office visit: 5  Interim History: Alicia Knapp is noticing significant improvement in snacking between meals and decrease in carb intake.  Had a quiet holiday season in December.  Next month is her birthday and she is going to a concert and eating out.  Wants to continue current plan.  Subjective:   1. Essential hypertension Fraida's blood pressure is slightly elevated today.  Denies chest pain, chest pressure and headache.  On Norvasc, HCTZ, and Cozaar.  2. Elevated random blood glucose level Started Trulicity with good carb and snacking control.  Denies GI side effects.  Assessment/Plan:   1. Essential hypertension Follow up blood pressure at next appointment without any changes in medication or dose.  2. Elevated random blood glucose level We will refill Trulicity 6.73 mg SubQ once weekly for 1 month with 0 refills.  -Refill Dulaglutide (TRULICITY) 4.19 FX/9.0WI SOPN; Inject 0.75 mg into the skin once a week.  Dispense: 6 mL; Refill: 0  3. Obesity with current BMI of 31.0 Alicia Knapp is currently in the action stage of change. As such, her goal is to continue with weight loss efforts. She has agreed to the [employer] +200.   Exercise goals: All adults should avoid inactivity. Some physical activity is better than none, and adults who participate in any amount of physical activity gain some health benefits.  Behavioral modification strategies: increasing lean  protein intake, meal planning and cooking strategies, keeping healthy foods in the home, and planning for success.  Alicia Knapp has agreed to follow-up with our clinic in 4 weeks. She was informed of the importance of frequent follow-up visits to maximize her success with intensive lifestyle modifications for her multiple health conditions.   Objective:   Blood pressure (!) 147/74, pulse 75, temperature 98.7 F (37.1 C), height '5\' 6"'$  (1.676 m), weight 192 lb (87.1 kg), SpO2 100 %. Body mass index is 30.99 kg/m.  General: Cooperative, alert, well developed, in no acute distress. HEENT: Conjunctivae and lids unremarkable. Cardiovascular: Regular rhythm.  Lungs: Normal work of breathing. Neurologic: No focal deficits.   Lab Results  Component Value Date   CREATININE 0.80 07/22/2022   BUN 15 07/22/2022   NA 144 07/22/2022   K 4.9 07/22/2022   CL 105 07/22/2022   CO2 24 07/22/2022   Lab Results  Component Value Date   ALT 26 07/22/2022   AST 16 07/22/2022   ALKPHOS 81 07/22/2022   BILITOT 0.3 07/22/2022   Lab Results  Component Value Date   HGBA1C 5.5 07/22/2022   HGBA1C 5.5 04/01/2022   HGBA1C 5.5 10/01/2021   HGBA1C 5.3 12/25/2020   HGBA1C 5.3 08/08/2020   Lab Results  Component Value Date   INSULIN 19.3 07/22/2022   INSULIN 11.2 12/25/2020   INSULIN 20.6 08/08/2020   INSULIN 10.4 08/16/2019   INSULIN 20.3 04/08/2019   Lab Results  Component Value Date   TSH 1.63 10/01/2021   Lab Results  Component Value Date   CHOL 166  07/22/2022   HDL 54 07/22/2022   LDLCALC 95 07/22/2022   LDLDIRECT 140.5 08/12/2013   TRIG 92 07/22/2022   CHOLHDL 4 04/01/2022   Lab Results  Component Value Date   VD25OH 43.2 07/22/2022   VD25OH 35.71 04/01/2022   VD25OH 36.62 10/01/2021   Lab Results  Component Value Date   WBC 3.4 (L) 10/01/2021   HGB 11.9 (L) 10/01/2021   HCT 36.5 10/01/2021   MCV 86.9 10/01/2021   PLT 340.0 10/01/2021   No results found for: "IRON", "TIBC",  "FERRITIN"  Attestation Statements:   Reviewed by clinician on day of visit: allergies, medications, problem list, medical history, surgical history, family history, social history, and previous encounter notes.  I, Elnora Morrison, RMA am acting as transcriptionist for Coralie Common, MD.  I have reviewed the above documentation for accuracy and completeness, and I agree with the above. - Coralie Common, MD

## 2022-12-11 ENCOUNTER — Encounter (INDEPENDENT_AMBULATORY_CARE_PROVIDER_SITE_OTHER): Payer: Self-pay | Admitting: Family Medicine

## 2022-12-11 ENCOUNTER — Ambulatory Visit (INDEPENDENT_AMBULATORY_CARE_PROVIDER_SITE_OTHER): Payer: Medicare PPO | Admitting: Family Medicine

## 2022-12-11 VITALS — BP 134/77 | HR 82 | Temp 99.0°F | Ht 66.0 in | Wt 188.0 lb

## 2022-12-11 DIAGNOSIS — E559 Vitamin D deficiency, unspecified: Secondary | ICD-10-CM

## 2022-12-11 DIAGNOSIS — Z6830 Body mass index (BMI) 30.0-30.9, adult: Secondary | ICD-10-CM

## 2022-12-11 DIAGNOSIS — E669 Obesity, unspecified: Secondary | ICD-10-CM | POA: Diagnosis not present

## 2022-12-11 DIAGNOSIS — R7309 Other abnormal glucose: Secondary | ICD-10-CM | POA: Diagnosis not present

## 2022-12-11 MED ORDER — METFORMIN HCL 500 MG PO TABS: 500.0000 mg | ORAL_TABLET | Freq: Two times a day (BID) | ORAL | 0 refills | Status: DC

## 2022-12-11 MED ORDER — VITAMIN D (ERGOCALCIFEROL) 1.25 MG (50000 UNIT) PO CAPS: 50000.0000 [IU] | ORAL_CAPSULE | ORAL | 0 refills | Status: DC

## 2022-12-11 NOTE — Progress Notes (Deleted)
Patient feels like she has been more compliant with meal plan since last appointment.  Only thing coming up is her birthday.  She isn't planning much- not sure if she will eat out or not.  Has been doing quite a bit of working out as well.  She is incorporating strength training 3x a week.  Denies any hunger.  Has to remind herself she has to eat. Wants to stick to the same plan for the next few weeks.

## 2022-12-25 NOTE — Progress Notes (Signed)
Chief Complaint:   OBESITY Alicia Knapp is here to discuss her progress with her obesity treatment plan along with follow-up of her obesity related diagnoses. Alicia Knapp is on the [employer] and states she is following her eating plan approximately 90% of the time. Alicia Knapp states she is strength/cycling/core 30/30/15 minutes 1-2 times per week.  Today's visit was #: 61 Starting weight: 212 lbs Starting date: 08/03/2018 Today's weight: 188 lbs Today's date: 12/11/2022 Total lbs lost to date: 24 lbs Total lbs lost since last in-office visit: 4  Interim History: Alicia Knapp feels like she has been more compliant with meal plan since last appointment.  Only thing coming up is her birthday.  She isn't planning much- not sure if she will eat out or not.  Has been doing quite a bit of working out as well.  She is incorporating strength training 3x a week.  Denies any hunger.  Has to remind herself she has to eat. Wants to stick to the same plan for the next few weeks.  Subjective:   1. Vitamin D deficiency Alicia Knapp is currently taking prescription Vit D 50,000 IU once a week.  Denies any nausea, vomiting or muscle weakness.  Last vitamin D level of 43.  2. Elevated random blood glucose level On Trulicity and Metformin.  No GI side effects of either medications.  Assessment/Plan:   1. Vitamin D deficiency We will refill Vit D 50K IU once a week for 1 month with 0 refills.  -Refill Vitamin D, Ergocalciferol, (DRISDOL) 1.25 MG (50000 UNIT) CAPS capsule; Take 1 capsule (50,000 Units total) by mouth every 7 (seven) days.  Dispense: 12 capsule; Refill: 0  2. Elevated random blood glucose level Will refill Metformin 500 mg twice a day for 3 months with 0 refills.  -Refill metFORMIN (GLUCOPHAGE) 500 MG tablet; Take 1 tablet (500 mg total) by mouth 2 (two) times daily with a meal.  Dispense: 180 tablet; Refill: 0  3. Obesity with current BMI of 30.3 Alicia Knapp is currently in the action stage of change. As such, her  goal is to continue with weight loss efforts. She has agreed to the [employer].   Exercise goals: As is.  Behavioral modification strategies: increasing lean protein intake, meal planning and cooking strategies, keeping healthy foods in the home, better snacking choices, and planning for success.  Alicia Knapp has agreed to follow-up with our clinic in 4 weeks. She was informed of the importance of frequent follow-up visits to maximize her success with intensive lifestyle modifications for her multiple health conditions.   Objective:   Blood pressure 134/77, pulse 82, temperature 99 F (37.2 C), height '5\' 6"'$  (1.676 m), weight 188 lb (85.3 kg), SpO2 100 %. Body mass index is 30.34 kg/m.  General: Cooperative, alert, well developed, in no acute distress. HEENT: Conjunctivae and lids unremarkable. Cardiovascular: Regular rhythm.  Lungs: Normal work of breathing. Neurologic: No focal deficits.   Lab Results  Component Value Date   CREATININE 0.80 07/22/2022   BUN 15 07/22/2022   NA 144 07/22/2022   K 4.9 07/22/2022   CL 105 07/22/2022   CO2 24 07/22/2022   Lab Results  Component Value Date   ALT 26 07/22/2022   AST 16 07/22/2022   ALKPHOS 81 07/22/2022   BILITOT 0.3 07/22/2022   Lab Results  Component Value Date   HGBA1C 5.5 07/22/2022   HGBA1C 5.5 04/01/2022   HGBA1C 5.5 10/01/2021   HGBA1C 5.3 12/25/2020   HGBA1C 5.3 08/08/2020  Lab Results  Component Value Date   INSULIN 19.3 07/22/2022   INSULIN 11.2 12/25/2020   INSULIN 20.6 08/08/2020   INSULIN 10.4 08/16/2019   INSULIN 20.3 04/08/2019   Lab Results  Component Value Date   TSH 1.63 10/01/2021   Lab Results  Component Value Date   CHOL 166 07/22/2022   HDL 54 07/22/2022   LDLCALC 95 07/22/2022   LDLDIRECT 140.5 08/12/2013   TRIG 92 07/22/2022   CHOLHDL 4 04/01/2022   Lab Results  Component Value Date   VD25OH 43.2 07/22/2022   VD25OH 35.71 04/01/2022   VD25OH 36.62 10/01/2021   Lab Results   Component Value Date   WBC 3.4 (L) 10/01/2021   HGB 11.9 (L) 10/01/2021   HCT 36.5 10/01/2021   MCV 86.9 10/01/2021   PLT 340.0 10/01/2021   No results found for: "IRON", "TIBC", "FERRITIN"  Attestation Statements:   Reviewed by clinician on day of visit: allergies, medications, problem list, medical history, surgical history, family history, social history, and previous encounter notes.  I, Elnora Morrison, RMA am acting as transcriptionist for Coralie Common, MD.  I have reviewed the above documentation for accuracy and completeness, and I agree with the above. - Coralie Common, MD

## 2023-01-12 ENCOUNTER — Ambulatory Visit (INDEPENDENT_AMBULATORY_CARE_PROVIDER_SITE_OTHER): Payer: Medicare PPO | Admitting: Family Medicine

## 2023-01-19 ENCOUNTER — Other Ambulatory Visit (INDEPENDENT_AMBULATORY_CARE_PROVIDER_SITE_OTHER): Payer: Self-pay | Admitting: Family Medicine

## 2023-01-19 DIAGNOSIS — R7309 Other abnormal glucose: Secondary | ICD-10-CM

## 2023-01-21 ENCOUNTER — Encounter (INDEPENDENT_AMBULATORY_CARE_PROVIDER_SITE_OTHER): Payer: Self-pay | Admitting: Family Medicine

## 2023-01-21 ENCOUNTER — Ambulatory Visit (INDEPENDENT_AMBULATORY_CARE_PROVIDER_SITE_OTHER): Payer: Medicare PPO | Admitting: Family Medicine

## 2023-01-21 VITALS — BP 128/76 | HR 91 | Temp 98.5°F | Ht 66.0 in | Wt 190.0 lb

## 2023-01-21 DIAGNOSIS — I10 Essential (primary) hypertension: Secondary | ICD-10-CM | POA: Diagnosis not present

## 2023-01-21 DIAGNOSIS — Z6830 Body mass index (BMI) 30.0-30.9, adult: Secondary | ICD-10-CM

## 2023-01-21 DIAGNOSIS — E785 Hyperlipidemia, unspecified: Secondary | ICD-10-CM

## 2023-01-21 DIAGNOSIS — E559 Vitamin D deficiency, unspecified: Secondary | ICD-10-CM | POA: Diagnosis not present

## 2023-01-21 DIAGNOSIS — E7849 Other hyperlipidemia: Secondary | ICD-10-CM

## 2023-01-21 DIAGNOSIS — E669 Obesity, unspecified: Secondary | ICD-10-CM

## 2023-01-21 DIAGNOSIS — R7309 Other abnormal glucose: Secondary | ICD-10-CM | POA: Diagnosis not present

## 2023-01-21 MED ORDER — VITAMIN D (ERGOCALCIFEROL) 1.25 MG (50000 UNIT) PO CAPS: 50000.0000 [IU] | ORAL_CAPSULE | ORAL | 0 refills | Status: DC

## 2023-01-21 MED ORDER — TRULICITY 1.5 MG/0.5ML ~~LOC~~ SOAJ: 1.5000 mg | SUBCUTANEOUS | 0 refills | Status: DC

## 2023-01-21 NOTE — Progress Notes (Signed)
Chief Complaint:   OBESITY Alicia Knapp is here to discuss her progress with her obesity treatment plan along with follow-up of her obesity related diagnoses. Alicia Knapp is on the [employer] and states she is following her eating plan approximately 85% of the time. Alicia Knapp states she is strength, exercise, core, cycling 30 minutes 1-2 times per week.  Today's visit was #: 28 Starting weight: 212 lbs Starting date: 05/03/2018 Today's weight: 190 lbs Today's date: 01/21/2023 Total lbs lost to date: 22 lbs Total lbs lost since last in-office visit: 2 lbs  Interim History: Patient voices after last appointment she was starting to get more hungry. In response to that hunger she was snacking a bit more frequently instead of cooking.  For Easter she voices she may go out to meet her great niece but isn't sure yet.  Likes the pescatarian plan so far. She is gardening more to set up the spring garden.  Subjective:   1. Elevated random blood glucose level Patient last A1c was 5.5, insulin 19.3.  Patient is on Trulicity and has some carb cravings returning.  2. Vitamin D deficiency Patient is on prescription vitamin D.  Last vitamin D level was 43.2.  3. Other hyperlipidemia Patient is on Lipitor. Patient denies transaminitis or myalgias.    4. Essential hypertension Blood pressure is well controlled today.  Patient denies chest pain, chest pressure or headache.   Assessment/Plan:   1. Elevated random blood glucose level Refill - Dulaglutide (TRULICITY) 1.5 0000000 SOPN; Inject 1.5 mg into the skin once a week.  Dispense: 2 mL; Refill: 0  - Hemoglobin A1c - Insulin, random  2. Vitamin D deficiency Check labs today.   - VITAMIN D 25 Hydroxy (Vit-D Deficiency, Fractures)  Refill - Vitamin D, Ergocalciferol, (DRISDOL) 1.25 MG (50000 UNIT) CAPS capsule; Take 1 capsule (50,000 Units total) by mouth every 7 (seven) days.  Dispense: 12 capsule; Refill: 0  3. Other hyperlipidemia Check labs  today.   - Lipid Panel With LDL/HDL Ratio  4. Essential hypertension Check labs today.   - Comprehensive metabolic panel  5. Obesity with current BMI of 30.8 Alicia Knapp is currently in the action stage of change. As such, her goal is to continue with weight loss efforts. She has agreed to the [employer].   Exercise goals: All adults should avoid inactivity. Some physical activity is better than none, and adults who participate in any amount of physical activity gain some health benefits.  Behavioral modification strategies: increasing lean protein intake, keeping healthy foods in the home, ways to avoid boredom eating, and planning for success.  Alicia Knapp has agreed to follow-up with our clinic in 4 weeks. She was informed of the importance of frequent follow-up visits to maximize her success with intensive lifestyle modifications for her multiple health conditions.   Objective:   Blood pressure 128/76, pulse 91, temperature 98.5 F (36.9 C), height 5\' 6"  (1.676 m), weight 190 lb (86.2 kg), SpO2 100 %. Body mass index is 30.67 kg/m.  General: Cooperative, alert, well developed, in no acute distress. HEENT: Conjunctivae and lids unremarkable. Cardiovascular: Regular rhythm.  Lungs: Normal work of breathing. Neurologic: No focal deficits.   Lab Results  Component Value Date   CREATININE 0.79 01/21/2023   BUN 11 01/21/2023   NA 142 01/21/2023   K 4.9 01/21/2023   CL 104 01/21/2023   CO2 23 01/21/2023   Lab Results  Component Value Date   ALT 14 01/21/2023   AST 15  01/21/2023   ALKPHOS 83 01/21/2023   BILITOT 0.3 01/21/2023   Lab Results  Component Value Date   HGBA1C 5.5 01/21/2023   HGBA1C 5.5 07/22/2022   HGBA1C 5.5 04/01/2022   HGBA1C 5.5 10/01/2021   HGBA1C 5.3 12/25/2020   Lab Results  Component Value Date   INSULIN 16.1 01/21/2023   INSULIN 19.3 07/22/2022   INSULIN 11.2 12/25/2020   INSULIN 20.6 08/08/2020   INSULIN 10.4 08/16/2019   Lab Results   Component Value Date   TSH 1.63 10/01/2021   Lab Results  Component Value Date   CHOL 148 01/21/2023   HDL 50 01/21/2023   LDLCALC 87 01/21/2023   LDLDIRECT 140.5 08/12/2013   TRIG 54 01/21/2023   CHOLHDL 4 04/01/2022   Lab Results  Component Value Date   VD25OH 90.7 01/21/2023   VD25OH 43.2 07/22/2022   VD25OH 35.71 04/01/2022   Lab Results  Component Value Date   WBC 3.4 (L) 10/01/2021   HGB 11.9 (L) 10/01/2021   HCT 36.5 10/01/2021   MCV 86.9 10/01/2021   PLT 340.0 10/01/2021   No results found for: "IRON", "TIBC", "FERRITIN"  Attestation Statements:   Reviewed by clinician on day of visit: allergies, medications, problem list, medical history, surgical history, family history, social history, and previous encounter notes.  I, Davy Pique, RMA, am acting as transcriptionist for Coralie Common, MD.  I have reviewed the above documentation for accuracy and completeness, and I agree with the above. - Coralie Common, MD

## 2023-01-22 ENCOUNTER — Encounter (INDEPENDENT_AMBULATORY_CARE_PROVIDER_SITE_OTHER): Payer: Self-pay | Admitting: Family Medicine

## 2023-01-22 LAB — COMPREHENSIVE METABOLIC PANEL
ALT: 14 IU/L (ref 0–32)
AST: 15 IU/L (ref 0–40)
Albumin/Globulin Ratio: 1.4 (ref 1.2–2.2)
Albumin: 4.5 g/dL (ref 3.9–4.9)
Alkaline Phosphatase: 83 IU/L (ref 44–121)
BUN/Creatinine Ratio: 14 (ref 12–28)
BUN: 11 mg/dL (ref 8–27)
Bilirubin Total: 0.3 mg/dL (ref 0.0–1.2)
CO2: 23 mmol/L (ref 20–29)
Calcium: 10.1 mg/dL (ref 8.7–10.3)
Chloride: 104 mmol/L (ref 96–106)
Creatinine, Ser: 0.79 mg/dL (ref 0.57–1.00)
Globulin, Total: 3.2 g/dL (ref 1.5–4.5)
Glucose: 83 mg/dL (ref 70–99)
Potassium: 4.9 mmol/L (ref 3.5–5.2)
Sodium: 142 mmol/L (ref 134–144)
Total Protein: 7.7 g/dL (ref 6.0–8.5)
eGFR: 83 mL/min/{1.73_m2} (ref >59)

## 2023-01-22 LAB — VITAMIN D 25 HYDROXY (VIT D DEFICIENCY, FRACTURES): Vit D, 25-Hydroxy: 90.7 ng/mL (ref 30.0–100.0)

## 2023-01-22 LAB — LIPID PANEL WITH LDL/HDL RATIO
Cholesterol, Total: 148 mg/dL (ref 100–199)
HDL: 50 mg/dL (ref >39)
LDL Chol Calc (NIH): 87 mg/dL (ref 0–99)
LDL/HDL Ratio: 1.7 ratio (ref 0.0–3.2)
Triglycerides: 54 mg/dL (ref 0–149)
VLDL Cholesterol Cal: 11 mg/dL (ref 5–40)

## 2023-01-22 LAB — HEMOGLOBIN A1C
Est. average glucose Bld gHb Est-mCnc: 111 mg/dL
Hgb A1c MFr Bld: 5.5 % (ref 4.8–5.6)

## 2023-01-22 LAB — INSULIN, RANDOM: INSULIN: 16.1 u[IU]/mL (ref 2.6–24.9)

## 2023-01-27 ENCOUNTER — Other Ambulatory Visit (INDEPENDENT_AMBULATORY_CARE_PROVIDER_SITE_OTHER): Payer: Self-pay

## 2023-01-27 DIAGNOSIS — R7309 Other abnormal glucose: Secondary | ICD-10-CM

## 2023-01-27 MED ORDER — TRULICITY 0.75 MG/0.5ML ~~LOC~~ SOAJ: 0.7500 mg | SUBCUTANEOUS | 0 refills | Status: DC

## 2023-02-18 ENCOUNTER — Encounter (INDEPENDENT_AMBULATORY_CARE_PROVIDER_SITE_OTHER): Payer: Self-pay | Admitting: Family Medicine

## 2023-02-18 ENCOUNTER — Ambulatory Visit (INDEPENDENT_AMBULATORY_CARE_PROVIDER_SITE_OTHER): Payer: Medicare PPO | Admitting: Family Medicine

## 2023-02-18 VITALS — BP 152/71 | HR 67 | Temp 98.4°F | Ht 66.0 in | Wt 189.0 lb

## 2023-02-18 DIAGNOSIS — E559 Vitamin D deficiency, unspecified: Secondary | ICD-10-CM | POA: Diagnosis not present

## 2023-02-18 DIAGNOSIS — Z6830 Body mass index (BMI) 30.0-30.9, adult: Secondary | ICD-10-CM

## 2023-02-18 DIAGNOSIS — I10 Essential (primary) hypertension: Secondary | ICD-10-CM

## 2023-02-18 DIAGNOSIS — E669 Obesity, unspecified: Secondary | ICD-10-CM | POA: Diagnosis not present

## 2023-02-18 NOTE — Progress Notes (Signed)
Chief Complaint:   OBESITY Alicia Knapp is here to discuss her progress with her obesity treatment plan along with follow-up of her obesity related diagnoses. Alicia Knapp is on the [employer] and states she is following her eating plan approximately 70% of the time. Alicia Knapp states she is strength and core cycling 30 minutes 1-2 times per week.  Today's visit was #: 76 Starting weight: 212 LBS Starting date: 08/03/2018 Today's weight: 189 LBS Today's date: 02/18/2023 Total lbs lost to date: 23 LBS Total lbs lost since last in-office visit: 1 LB  Interim History: She has been gardening more frequently recently.  She realizes that she has also eaten out more frequently.  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 She is going to [place] mid May for a trip with her husband.  She is hopeful that break will help get her back on track.  She is hoping to go on a lunch cruise while away.    Subjective:   1. Essential hypertension Patient blood pressure elevated again today.  Patient forgot to take her medication.  Patient denies chest pain, chest pressure, headache.  2. Vitamin D deficiency Patient is on prescription vitamin D previously.  Vitamin D level on labs at last appointment.  Assessment/Plan:   1. Essential hypertension Continue amlodipine and Cozaar.  No change in dose, follow-up on blood pressure at next appointment.  2. Vitamin D deficiency Stop vitamin D.  Start OTC multivitamin, repeat labs in 3 months.  3. Obesity with current BMI of 30.6 Alicia Knapp is currently in the action stage of change. As such, her goal is to continue with weight loss efforts. She has agreed to the [employer].   Exercise goals:  As is.  Behavioral modification strategies: increasing lean protein intake, meal planning and cooking strategies, keeping healthy foods in the home, and planning for success.  Alicia Knapp has agreed to follow-up with our clinic in 5 weeks. She was  informed of the importance of frequent follow-up visits to maximize her success with intensive lifestyle modifications for her multiple health conditions.   Objective:   Blood pressure (!) 152/71, pulse 67, temperature 98.4 F (36.9 C), height  (1.676 m), weight 189 lb (85.7 kg), SpO2 100 %. Body mass index is 30.51 kg/m.  General: Cooperative, alert, well developed, in no acute distress. HEENT: Conjunctivae and lids unremarkable. Cardiovascular: Regular rhythm.  Lungs: Normal work of breathing. Neurologic: No focal deficits.   Lab Results  Component Value Date   CREATININE 0.79 01/21/2023   BUN 11 01/21/2023   NA 142 01/21/2023   K 4.9 01/21/2023   CL 104 01/21/2023   CO2 23 01/21/2023   Lab Results  Component Value Date   ALT 14 01/21/2023   AST 15 01/21/2023   ALKPHOS 83 01/21/2023   BILITOT 0.3 01/21/2023   Lab Results  Component Value Date   HGBA1C 5.5 01/21/2023   HGBA1C 5.5 07/22/2022   HGBA1C 5.5 04/01/2022   HGBA1C 5.5 10/01/2021   HGBA1C 5.3 12/25/2020   Lab Results  Component Value Date   INSULIN 16.1 01/21/2023   INSULIN 19.3 07/22/2022   INSULIN 11.2 12/25/2020   INSULIN 20.6 08/08/2020   INSULIN 10.4 08/16/2019   Lab Results  Component Value Date   TSH 1.63 10/01/2021   Lab Results  Component Value Date   CHOL 148 01/21/2023   HDL 50 01/21/2023   LDLCALC 87 01/21/2023   LDLDIRECT 140.5 08/12/2013  TRIG 54 01/21/2023   CHOLHDL 4 04/01/2022   Lab Results  Component Value Date   VD25OH 90.7 01/21/2023   VD25OH 43.2 07/22/2022   VD25OH 35.71 04/01/2022   Lab Results  Component Value Date   WBC 3.4 (L) 10/01/2021   HGB 11.9 (L) 10/01/2021   HCT 36.5 10/01/2021   MCV 86.9 10/01/2021   PLT 340.0 10/01/2021   No results found for: "IRON", "TIBC", "FERRITIN"  Attestation Statements:   Reviewed by clinician on day of visit: allergies, medications, problem list, medical history, surgical history, family history, social history,  and previous encounter notes.  I, Malcolm Metro, RMA, am acting as transcriptionist for Reuben Likes, MD.  I have reviewed the above documentation for accuracy and completeness, and I agree with the above. - Reuben Likes, MD

## 2023-03-25 ENCOUNTER — Encounter (INDEPENDENT_AMBULATORY_CARE_PROVIDER_SITE_OTHER): Payer: Self-pay | Admitting: Family Medicine

## 2023-03-25 ENCOUNTER — Ambulatory Visit (INDEPENDENT_AMBULATORY_CARE_PROVIDER_SITE_OTHER): Payer: Medicare PPO | Admitting: Family Medicine

## 2023-03-25 VITALS — BP 123/72 | HR 66 | Temp 98.8°F | Ht 66.0 in | Wt 189.0 lb

## 2023-03-25 DIAGNOSIS — Z6830 Body mass index (BMI) 30.0-30.9, adult: Secondary | ICD-10-CM | POA: Diagnosis not present

## 2023-03-25 DIAGNOSIS — E669 Obesity, unspecified: Secondary | ICD-10-CM | POA: Diagnosis not present

## 2023-03-25 DIAGNOSIS — R7309 Other abnormal glucose: Secondary | ICD-10-CM | POA: Diagnosis not present

## 2023-03-25 DIAGNOSIS — Z6834 Body mass index (BMI) 34.0-34.9, adult: Secondary | ICD-10-CM

## 2023-03-25 DIAGNOSIS — I10 Essential (primary) hypertension: Secondary | ICD-10-CM

## 2023-03-25 MED ORDER — METFORMIN HCL 500 MG PO TABS: 500.0000 mg | ORAL_TABLET | Freq: Two times a day (BID) | ORAL | 0 refills | Status: DC

## 2023-03-25 NOTE — Progress Notes (Signed)
Chief Complaint:   OBESITY Alicia Knapp is here to discuss her progress with her obesity treatment plan along with follow-up of her obesity related diagnoses. Alicia Knapp is on the [employer] and states she is following her eating plan approximately 50% of the time. Alicia Knapp states she is walking and using the exercise machine 1-6 times per week.    Today's visit was #: 77 Starting weight: 212 lbs Starting date: 08/03/2018 Today's weight: 189 lbs Today's date: 03/25/2023 Total lbs lost to date: 23 Total lbs lost since last in-office visit: 0  Interim History: Patient went to [facility] since last appointment.  She went to [facility] and went to get artwork at [employer].  Plans to go back in September and do all the things she normally does.  Got up and walked 45 minutes and used the gym on the premises. Going to a friend's house for a barbecue for Friday and Saturday.  Her garden is doing very well.  Subjective:   1. Elevated random blood glucose level Patient is on combination metformin and Trulicity.  No GI side effects were noted.  2. Essential hypertension Patient's blood pressure is controlled today.  She is on Cozaar, hydrochlorothiazide, Coreg, and Norvasc.  She denies chest pain, chest pressure, or headache.  Assessment/Plan:   1. Elevated random blood glucose level We will refill metformin at 500 mg twice daily for 90 days.  - metFORMIN (GLUCOPHAGE) 500 MG tablet; Take 1 tablet (500 mg total) by mouth 2 (two) times daily with a meal.  Dispense: 180 tablet; Refill: 0  2. Essential hypertension Patient is to continue her current medications with no change in dose.  3. BMI 30.0-30.9,adult  4. Obesity with starting BMI of 34.2 Alicia Knapp is currently in the action stage of change. As such, her goal is to continue with weight loss efforts. She has agreed to the Category 2 Plan and the Pescatarian Plan.   Exercise goals: All adults should avoid inactivity. Some physical activity  is better than none, and adults who participate in any amount of physical activity gain some health benefits. Patient is to restart her exercises at home.   Behavioral modification strategies: increasing lean protein intake, meal planning and cooking strategies, keeping healthy foods in the home, and planning for success.  Alicia Knapp has agreed to follow-up with our clinic in 4 weeks. She was informed of the importance of frequent follow-up visits to maximize her success with intensive lifestyle modifications for her multiple health conditions.   Objective:   Blood pressure 123/72, pulse 66, temperature 98.8 F (37.1 C), height 5\' 6"  (1.676 m), weight 189 lb (85.7 kg), SpO2 100 %. Body mass index is 30.51 kg/m.  General: Cooperative, alert, well developed, in no acute distress. HEENT: Conjunctivae and lids unremarkable. Cardiovascular: Regular rhythm.  Lungs: Normal work of breathing. Neurologic: No focal deficits.   Lab Results  Component Value Date   CREATININE 0.79 01/21/2023   BUN 11 01/21/2023   NA 142 01/21/2023   K 4.9 01/21/2023   CL 104 01/21/2023   CO2 23 01/21/2023   Lab Results  Component Value Date   ALT 14 01/21/2023   AST 15 01/21/2023   ALKPHOS 83 01/21/2023   BILITOT 0.3 01/21/2023   Lab Results  Component Value Date   HGBA1C 5.5 01/21/2023   HGBA1C 5.5 07/22/2022   HGBA1C 5.5 04/01/2022   HGBA1C 5.5 10/01/2021   HGBA1C 5.3 12/25/2020   Lab Results  Component Value Date  INSULIN 16.1 01/21/2023   INSULIN 19.3 07/22/2022   INSULIN 11.2 12/25/2020   INSULIN 20.6 08/08/2020   INSULIN 10.4 08/16/2019   Lab Results  Component Value Date   TSH 1.63 10/01/2021   Lab Results  Component Value Date   CHOL 148 01/21/2023   HDL 50 01/21/2023   LDLCALC 87 01/21/2023   LDLDIRECT 140.5 08/12/2013   TRIG 54 01/21/2023   CHOLHDL 4 04/01/2022   Lab Results  Component Value Date   VD25OH 90.7 01/21/2023   VD25OH 43.2 07/22/2022   VD25OH 35.71 04/01/2022    Lab Results  Component Value Date   WBC 3.4 (L) 10/01/2021   HGB 11.9 (L) 10/01/2021   HCT 36.5 10/01/2021   MCV 86.9 10/01/2021   PLT 340.0 10/01/2021   No results found for: "IRON", "TIBC", "FERRITIN"  Attestation Statements:   Reviewed by clinician on day of visit: allergies, medications, problem list, medical history, surgical history, family history, social history, and previous encounter notes.   I, Burt Knack, am acting as transcriptionist for Reuben Likes, MD.  I have reviewed the above documentation for accuracy and completeness, and I agree with the above. - Reuben Likes, MD

## 2023-04-03 ENCOUNTER — Encounter: Payer: Self-pay | Admitting: Family Medicine

## 2023-04-03 ENCOUNTER — Ambulatory Visit: Payer: Medicare PPO | Admitting: Family Medicine

## 2023-04-03 VITALS — BP 142/84 | HR 64 | Temp 98.3°F | Ht 66.0 in | Wt 196.4 lb

## 2023-04-03 DIAGNOSIS — Z0001 Encounter for general adult medical examination with abnormal findings: Secondary | ICD-10-CM | POA: Diagnosis not present

## 2023-04-03 DIAGNOSIS — Z0000 Encounter for general adult medical examination without abnormal findings: Secondary | ICD-10-CM

## 2023-04-03 DIAGNOSIS — Z23 Encounter for immunization: Secondary | ICD-10-CM | POA: Diagnosis not present

## 2023-04-03 DIAGNOSIS — H6123 Impacted cerumen, bilateral: Secondary | ICD-10-CM

## 2023-04-03 DIAGNOSIS — E785 Hyperlipidemia, unspecified: Secondary | ICD-10-CM | POA: Diagnosis not present

## 2023-04-03 DIAGNOSIS — I10 Essential (primary) hypertension: Secondary | ICD-10-CM

## 2023-04-03 DIAGNOSIS — J302 Other seasonal allergic rhinitis: Secondary | ICD-10-CM | POA: Diagnosis not present

## 2023-04-03 MED ORDER — FLUTICASONE PROPIONATE 50 MCG/ACT NA SUSP: 2.0000 | Freq: Every day | NASAL | 3 refills | Status: DC

## 2023-04-03 MED ORDER — LOSARTAN POTASSIUM 100 MG PO TABS: 100.0000 mg | ORAL_TABLET | Freq: Every day | ORAL | 3 refills | Status: DC

## 2023-04-03 MED ORDER — ATORVASTATIN CALCIUM 10 MG PO TABS: 10.0000 mg | ORAL_TABLET | Freq: Every day | ORAL | 3 refills | Status: DC

## 2023-04-03 MED ORDER — CARVEDILOL 6.25 MG PO TABS: 6.2500 mg | ORAL_TABLET | Freq: Two times a day (BID) | ORAL | 3 refills | Status: DC

## 2023-04-03 MED ORDER — LOSARTAN POTASSIUM 50 MG PO TABS: 50.0000 mg | ORAL_TABLET | Freq: Every day | ORAL | 3 refills | Status: DC

## 2023-04-03 MED ORDER — LEVOCETIRIZINE DIHYDROCHLORIDE 5 MG PO TABS: 5.0000 mg | ORAL_TABLET | Freq: Every evening | ORAL | 3 refills | Status: DC

## 2023-04-03 MED ORDER — HYDROCHLOROTHIAZIDE 25 MG PO TABS: 25.0000 mg | ORAL_TABLET | Freq: Every day | ORAL | 3 refills | Status: DC

## 2023-04-03 MED ORDER — AMLODIPINE BESYLATE 5 MG PO TABS: 5.0000 mg | ORAL_TABLET | Freq: Every day | ORAL | 3 refills | Status: DC

## 2023-04-03 MED ORDER — AMLODIPINE BESYLATE 10 MG PO TABS: 10.0000 mg | ORAL_TABLET | Freq: Every day | ORAL | 3 refills | Status: DC

## 2023-04-03 NOTE — Assessment & Plan Note (Signed)
Labs reviewed from March  Health Maintenance  Topic Date Due   HIV Screening  Never done   Colonoscopy  06/28/2021   MAMMOGRAM  01/07/2022   Pneumonia Vaccine 46+ Years old (1 of 1 - PCV) Never done   COVID-19 Vaccine (7 - 2023-24 season) 04/19/2023 (Originally 09/28/2022)   INFLUENZA VACCINE  06/04/2023   Medicare Annual Wellness (AWV)  04/02/2024   DTaP/Tdap/Td (3 - Td or Tdap) 04/18/2026   DEXA SCAN  Completed   Hepatitis C Screening  Completed   Zoster Vaccines- Shingrix  Completed   HPV VACCINES  Aged Out   See AVS

## 2023-04-03 NOTE — Patient Instructions (Signed)
Get [facility] for you ears.

## 2023-04-03 NOTE — Assessment & Plan Note (Signed)
Ears irrigated b/l with no complications

## 2023-04-03 NOTE — Assessment & Plan Note (Signed)
Encourage heart healthy diet such as MIND or DASH diet, increase exercise, avoid trans fats, simple carbohydrates and processed foods, consider a krill or fish or flaxseed oil cap daily.  °

## 2023-04-03 NOTE — Assessment & Plan Note (Signed)
Poorly controlled will alter medications, encouraged DASH diet, minimize caffeine and obtain adequate sleep. Report concerning symptoms and follow up as directed and as needed 

## 2023-04-03 NOTE — Progress Notes (Addendum)
Subjective:   By signing my name below, I, Alicia Knapp, attest that this documentation has been prepared under the direction and in the presence of Seabron Spates R, DO. 04/03/2023.   Patient ID: Alicia Knapp, female    DOB: 1956-05-15, 67 y.o.   MRN: [id number]  Chief Complaint  Patient presents with   Follow-up    Refills  No concerns     HPI Patient is in today for an office visit.  She states she is feeling okay. Her main complaint is that her ears feel clogged. She also has some bilateral ankle swelling which she attributes as a side effect of amlodipine.  She is requesting refills for all of her medications. This is her first year on Medicare, so we will perform an EKG today. She agrees to receive the pneumonia vaccination today.  Lately she continues to participate with [facility]. She is doing well with her exercise routines. On Mondays she has a total body workout with weights between 5-9 lbs. Tuesdays she does core exercises for 20 minutes as they are more intense. Wednesdays she completes cycling exercises. On Thursdays core exercises for 20 minutes. Fridays is cycling for 30 minutes. Sometimes she feels like she loses her balance. However she is able to catch herself and denies any falls.  On June 4th she will see her ophthalmologist for her yearly follow-up. She now has prescription readers. Her last dental checkup reportedly went well.  Her colonoscopy is scheduled for July. Has not yet made an appointment for mammogram but she plans to do this soon.  She also wished to review her recent labs and ultrasound testing, which we discussed in detail.   Past Medical History:  Diagnosis Date   Anxiety    B12 deficiency    Constipation    Heart murmur    Hyperlipidemia    Hypertension    Joint pain    Postoperative nausea    Prediabetes    Swelling    lower ext   Vitamin D deficiency     Past Surgical History:  Procedure Laterality Date    ABDOMINAL HYSTERECTOMY     APPENDECTOMY     COLONOSCOPY  05/22/2015   Dr.Pyrtle   POLYPECTOMY      Family History  Problem Relation Age of Onset   Cancer Father        prostate   Prostate cancer Father    Diabetes Mother    Thyroid disease Mother    Hypertension Mother    Hyperlipidemia Mother    Kidney disease Mother    Cancer Mother    Sleep apnea Mother    Obesity Mother    Hypertension Sister    Hypertension Brother    Stroke Brother    Colon cancer Brother 63       died 60 - 4-5 months after dx   Arthritis Maternal Grandmother    Esophageal cancer Neg Hx    Rectal cancer Neg Hx    Stomach cancer Neg Hx     Social History   Socioeconomic History   Marital status: Married    Spouse name: Marnette Burgess   Number of children: Not on file   Years of education: Not on file   Highest education level: Not on file  Occupational History   Occupation: Stay at home spouse  Tobacco Use   Smoking status: Former    Types: Cigarettes    Quit date: 09/17/2014    Years  since quitting: 8.5   Smokeless tobacco: Never  Vaping Use   Vaping Use: Never used  Substance and Sexual Activity   Alcohol use: No    Alcohol/week: 0.0 standard drinks of alcohol   Drug use: No   Sexual activity: Yes    Partners: Male  Other Topics Concern   Not on file  Social History Narrative   Lives in house with her husband   Social Determinants of Health   Financial Resource Strain: Not on file  Food Insecurity: Not on file  Transportation Needs: Not on file  Physical Activity: Not on file  Stress: Not on file  Social Connections: Not on file  Intimate Partner Violence: Not on file    Outpatient Medications Prior to Visit  Medication Sig Dispense Refill   ARIPiprazole (ABILIFY) 2 MG tablet Take 2 mg by mouth daily.      ARIPiprazole (ABILIFY) 5 MG tablet Take 1 tablet by mouth daily.  0   Dulaglutide (TRULICITY) 0.75 MG/0.5ML SOPN Inject 0.75 mg into the skin once a week. 6 mL 0    metFORMIN (GLUCOPHAGE) 500 MG tablet Take 1 tablet (500 mg total) by mouth 2 (two) times daily with a meal. 180 tablet 0   amLODipine (NORVASC) 10 MG tablet Take 1 tablet (10 mg total) by mouth daily. 90 tablet 3   atorvastatin (LIPITOR) 10 MG tablet Take 1 tablet (10 mg total) by mouth daily. 90 tablet 3   carvedilol (COREG) 6.25 MG tablet Take 1 tablet (6.25 mg total) by mouth 2 (two) times daily with a meal. 180 tablet 3   fluticasone (FLONASE) 50 MCG/ACT nasal spray Place 2 sprays into both nostrils daily. 48 g 3   hydrochlorothiazide (HYDRODIURIL) 25 MG tablet Take 1 tablet (25 mg total) by mouth daily. 90 tablet 3   levocetirizine (XYZAL) 5 MG tablet Take 1 tablet (5 mg total) by mouth every evening. 90 tablet 3   losartan (COZAAR) 50 MG tablet Take 1 tablet (50 mg total) by mouth daily. 90 tablet 3   No facility-administered medications prior to visit.    Allergies  Allergen Reactions   Erythromycin     Causes stomach cramps   Penicillins     rash    Review of Systems  Constitutional:  Negative for chills, fever and malaise/fatigue.  HENT:  Negative for congestion and hearing loss.        + Bilateral ear discomfort  Eyes:  Negative for blurred vision and discharge.  Respiratory:  Negative for cough, sputum production and shortness of breath.   Cardiovascular:  Positive for leg swelling (Bilateral ankles). Negative for chest pain and palpitations.  Gastrointestinal:  Negative for abdominal pain, blood in stool, constipation, diarrhea, heartburn, nausea and vomiting.  Genitourinary:  Negative for dysuria, frequency, hematuria and urgency.  Musculoskeletal:  Negative for back pain, falls and myalgias.  Skin:  Negative for rash.  Neurological:  Negative for dizziness, sensory change, loss of consciousness, weakness and headaches.  Endo/Heme/Allergies:  Negative for environmental allergies. Does not bruise/bleed easily.  Psychiatric/Behavioral:  Negative for depression and suicidal  ideas. The patient is not nervous/anxious and does not have insomnia.        Objective:    Physical Exam Vitals and nursing note reviewed.  Constitutional:      Appearance: Normal appearance.  HENT:     Head: Normocephalic and atraumatic.     Right Ear: External ear normal. There is impacted cerumen.     Left Ear: External  ear normal. There is impacted cerumen.     Nose: Nose normal.  Eyes:     Extraocular Movements: Extraocular movements intact.     Pupils: Pupils are equal, round, and reactive to light.  Cardiovascular:     Rate and Rhythm: Normal rate and regular rhythm.     Heart sounds: Normal heart sounds. No murmur heard.    No gallop.     Comments: Bilateral LE edema to the ankles. Pulmonary:     Effort: Pulmonary effort is normal. No respiratory distress.     Breath sounds: Normal breath sounds. No wheezing or rales.  Abdominal:     General: Abdomen is flat.     Palpations: Abdomen is soft.     Tenderness: There is no abdominal tenderness.  Musculoskeletal:        General: Normal range of motion.     Cervical back: Normal range of motion and neck supple.     Right lower leg: 1+ Edema present.     Left lower leg: 1+ Edema present.  Lymphadenopathy:     Cervical: No cervical adenopathy.  Skin:    General: Skin is warm and dry.  Neurological:     General: No focal deficit present.     Mental Status: She is alert and oriented to person, place, and time.     Gait: Gait normal.  Psychiatric:        Mood and Affect: Mood normal.        Behavior: Behavior normal.     BP (!) 142/84 (BP Location: Right Arm, Patient Position: Sitting, Cuff Size: Normal)   Pulse 64   Temp 98.3 F (36.8 C) (Oral)   Ht 5\' 6"  (1.676 m)   Wt 196 lb 6.4 oz (89.1 kg)   SpO2 98%   BMI 31.70 kg/m  Wt Readings from Last 3 Encounters:  04/03/23 196 lb 6.4 oz (89.1 kg)  03/25/23 189 lb (85.7 kg)  02/18/23 189 lb (85.7 kg)    Diabetic Foot Exam - Simple   No data filed    Lab  Results  Component Value Date   WBC 3.4 (L) 10/01/2021   HGB 11.9 (L) 10/01/2021   HCT 36.5 10/01/2021   PLT 340.0 10/01/2021   GLUCOSE 83 01/21/2023   CHOL 148 01/21/2023   TRIG 54 01/21/2023   HDL 50 01/21/2023   LDLDIRECT 140.5 08/12/2013   LDLCALC 87 01/21/2023   ALT 14 01/21/2023   AST 15 01/21/2023   NA 142 01/21/2023   K 4.9 01/21/2023   CL 104 01/21/2023   CREATININE 0.79 01/21/2023   BUN 11 01/21/2023   CO2 23 01/21/2023   TSH 1.63 10/01/2021   HGBA1C 5.5 01/21/2023   MICROALBUR <0.7 02/22/2015    Lab Results  Component Value Date   TSH 1.63 10/01/2021   Lab Results  Component Value Date   WBC 3.4 (L) 10/01/2021   HGB 11.9 (L) 10/01/2021   HCT 36.5 10/01/2021   MCV 86.9 10/01/2021   PLT 340.0 10/01/2021   Lab Results  Component Value Date   NA 142 01/21/2023   K 4.9 01/21/2023   CO2 23 01/21/2023   GLUCOSE 83 01/21/2023   BUN 11 01/21/2023   CREATININE 0.79 01/21/2023   BILITOT 0.3 01/21/2023   ALKPHOS 83 01/21/2023   AST 15 01/21/2023   ALT 14 01/21/2023   PROT 7.7 01/21/2023   ALBUMIN 4.5 01/21/2023   CALCIUM 10.1 01/21/2023   EGFR 83 01/21/2023   GFR  82.89 04/01/2022   Lab Results  Component Value Date   CHOL 148 01/21/2023   Lab Results  Component Value Date   HDL 50 01/21/2023   Lab Results  Component Value Date   LDLCALC 87 01/21/2023   Lab Results  Component Value Date   TRIG 54 01/21/2023   Lab Results  Component Value Date   CHOLHDL 4 04/01/2022   Lab Results  Component Value Date   HGBA1C 5.5 01/21/2023       Assessment & Plan:   Problem List Items Addressed This Visit       Unprioritized   Bilateral impacted cerumen    Ears irrigated b/l with no complications       Essential hypertension    Poorly controlled will alter medications, encouraged DASH diet, minimize caffeine and obtain adequate sleep. Report concerning symptoms and follow up as directed and as needed       Relevant Medications    atorvastatin (LIPITOR) 10 MG tablet   carvedilol (COREG) 6.25 MG tablet   hydrochlorothiazide (HYDRODIURIL) 25 MG tablet   losartan (COZAAR) 100 MG tablet   amLODipine (NORVASC) 5 MG tablet   Hyperlipidemia    Encourage heart healthy diet such as MIND or DASH diet, increase exercise, avoid trans fats, simple carbohydrates and processed foods, consider a krill or fish or flaxseed oil cap daily.        Relevant Medications   atorvastatin (LIPITOR) 10 MG tablet   carvedilol (COREG) 6.25 MG tablet   hydrochlorothiazide (HYDRODIURIL) 25 MG tablet   losartan (COZAAR) 100 MG tablet   amLODipine (NORVASC) 5 MG tablet   Need for pneumococcal 20-valent conjugate vaccination   Relevant Orders   Pneumococcal conjugate vaccine 20-valent (Prevnar 20) (Completed)   Seasonal allergic rhinitis   Relevant Medications   fluticasone (FLONASE) 50 MCG/ACT nasal spray   levocetirizine (XYZAL) 5 MG tablet   Welcome to Medicare preventive visit - Primary    Labs reviewed from March  Health Maintenance  Topic Date Due   HIV Screening  Never done   Colonoscopy  06/28/2021   MAMMOGRAM  01/07/2022   Pneumonia Vaccine 71+ Years old (1 of 1 - PCV) Never done   COVID-19 Vaccine (7 - 2023-24 season) 04/19/2023 (Originally 09/28/2022)   INFLUENZA VACCINE  06/04/2023   Medicare Annual Wellness (AWV)  04/02/2024   DTaP/Tdap/Td (3 - Td or Tdap) 04/18/2026   DEXA SCAN  Completed   Hepatitis C Screening  Completed   Zoster Vaccines- Shingrix  Completed   HPV VACCINES  Aged Out  See AVS       Relevant Orders   EKG 12-Lead (Completed)     Meds ordered this encounter  Medications   DISCONTD: amLODipine (NORVASC) 10 MG tablet    Sig: Take 1 tablet (10 mg total) by mouth daily.    Dispense:  90 tablet    Refill:  3   atorvastatin (LIPITOR) 10 MG tablet    Sig: Take 1 tablet (10 mg total) by mouth daily.    Dispense:  90 tablet    Refill:  3   carvedilol (COREG) 6.25 MG tablet    Sig: Take 1 tablet (6.25  mg total) by mouth 2 (two) times daily with a meal.    Dispense:  180 tablet    Refill:  3   hydrochlorothiazide (HYDRODIURIL) 25 MG tablet    Sig: Take 1 tablet (25 mg total) by mouth daily.    Dispense:  90 tablet  Refill:  3   fluticasone (FLONASE) 50 MCG/ACT nasal spray    Sig: Place 2 sprays into both nostrils daily.    Dispense:  48 g    Refill:  3   levocetirizine (XYZAL) 5 MG tablet    Sig: Take 1 tablet (5 mg total) by mouth every evening.    Dispense:  90 tablet    Refill:  3   DISCONTD: losartan (COZAAR) 50 MG tablet    Sig: Take 1 tablet (50 mg total) by mouth daily.    Dispense:  90 tablet    Refill:  3   losartan (COZAAR) 100 MG tablet    Sig: Take 1 tablet (100 mg total) by mouth daily.    Dispense:  90 tablet    Refill:  3   amLODipine (NORVASC) 5 MG tablet    Sig: Take 1 tablet (5 mg total) by mouth daily.    Dispense:  90 tablet    Refill:  3    Cancel norvasc 10 mg and losartan 50 mg    I, Donato Schultz, DO, personally preformed the services described in this documentation.  All medical record entries made by the scribe were at my direction and in my presence.  I have reviewed the chart and discharge instructions (if applicable) and agree that the record reflects my personal performance and is accurate and complete. 04/03/2023.  I,Mathew Stumpf,acting as a Neurosurgeon for [employer], DO.,have documented all relevant documentation on the behalf of Donato Schultz, DO,as directed by  Donato Schultz, DO while in the presence of Donato Schultz, DO.   Donato Schultz, DO

## 2023-04-06 DIAGNOSIS — F3174 Bipolar disorder, in full remission, most recent episode manic: Secondary | ICD-10-CM | POA: Diagnosis not present

## 2023-04-07 DIAGNOSIS — R7303 Prediabetes: Secondary | ICD-10-CM | POA: Diagnosis not present

## 2023-04-07 DIAGNOSIS — H524 Presbyopia: Secondary | ICD-10-CM | POA: Diagnosis not present

## 2023-04-07 DIAGNOSIS — H25013 Cortical age-related cataract, bilateral: Secondary | ICD-10-CM | POA: Diagnosis not present

## 2023-04-07 DIAGNOSIS — H2513 Age-related nuclear cataract, bilateral: Secondary | ICD-10-CM | POA: Diagnosis not present

## 2023-04-07 DIAGNOSIS — H52203 Unspecified astigmatism, bilateral: Secondary | ICD-10-CM | POA: Diagnosis not present

## 2023-04-07 LAB — HM DIABETES EYE EXAM

## 2023-04-13 ENCOUNTER — Other Ambulatory Visit (INDEPENDENT_AMBULATORY_CARE_PROVIDER_SITE_OTHER): Payer: Self-pay | Admitting: Family Medicine

## 2023-04-20 ENCOUNTER — Ambulatory Visit (AMBULATORY_SURGERY_CENTER): Payer: Medicare PPO

## 2023-04-20 VITALS — Ht 66.0 in | Wt 189.0 lb

## 2023-04-20 DIAGNOSIS — Z800 Family history of malignant neoplasm of digestive organs: Secondary | ICD-10-CM

## 2023-04-20 DIAGNOSIS — Z86010 Personal history of colonic polyps: Secondary | ICD-10-CM

## 2023-04-20 MED ORDER — NA SULFATE-K SULFATE-MG SULF 17.5-3.13-1.6 GM/177ML PO SOLN: 1.0000 | Freq: Once | ORAL | 0 refills | Status: AC

## 2023-04-20 NOTE — Progress Notes (Signed)
No egg or soy allergy known to patient  No issues known to pt with past sedation with any surgeries or procedures Patient denies ever being told they had issues or difficulty with intubation  No FH of Malignant Hyperthermia Pt is not on diet pills Pt is not on  home 02  Pt is not on blood thinners  Pt denies issues with constipation - 2 day prep given due to hx of adequate prep results  No A fib or A flutter Have any cardiac testing pending-- no pt had EKG on 5/31 NSR  LOA: independent   Patient's chart reviewed by Cathlyn Parsons CNRA prior to previsit and patient appropriate for the [facility].  Previsit completed and red dot placed by patient's name on their procedure day (on provider's schedule).      PV complete. Prep instructions reviewed with patient and sent via mychart and to home address. Good rx coupon for [facility] provided.Pr instructed that she would need to wait for the coupon to come in the mail before going to the pharmacy for price reduction.

## 2023-04-22 ENCOUNTER — Encounter (INDEPENDENT_AMBULATORY_CARE_PROVIDER_SITE_OTHER): Payer: Self-pay | Admitting: Family Medicine

## 2023-04-22 ENCOUNTER — Encounter: Payer: Self-pay | Admitting: Internal Medicine

## 2023-04-22 ENCOUNTER — Ambulatory Visit (INDEPENDENT_AMBULATORY_CARE_PROVIDER_SITE_OTHER): Payer: Medicare PPO | Admitting: Family Medicine

## 2023-04-22 VITALS — BP 168/82 | HR 64 | Temp 98.8°F | Ht 66.0 in | Wt 191.0 lb

## 2023-04-22 DIAGNOSIS — E669 Obesity, unspecified: Secondary | ICD-10-CM | POA: Diagnosis not present

## 2023-04-22 DIAGNOSIS — I10 Essential (primary) hypertension: Secondary | ICD-10-CM | POA: Diagnosis not present

## 2023-04-22 DIAGNOSIS — R7309 Other abnormal glucose: Secondary | ICD-10-CM

## 2023-04-22 DIAGNOSIS — Z6830 Body mass index (BMI) 30.0-30.9, adult: Secondary | ICD-10-CM | POA: Diagnosis not present

## 2023-04-22 MED ORDER — TRULICITY 1.5 MG/0.5ML ~~LOC~~ SOAJ: 1.5000 mg | SUBCUTANEOUS | 0 refills | Status: DC

## 2023-04-22 NOTE — Progress Notes (Signed)
Chief Complaint:   OBESITY Alicia Knapp is here to discuss her progress with her obesity treatment plan along with follow-up of her obesity related diagnoses. Alicia Knapp is on the Category 2 Plan and the Pescatarian Plan and states she is following her eating plan approximately 70% of the time. Alicia Knapp states she is working in the garden, and she is exercising and lifting weights for 40 minutes-1.15 hours 1-5 times per week.  Today's visit was #: [id number] Starting weight: 212 lbs Starting date: 08/03/2018 Today's weight: 191 lbs Today's date: 04/22/2023 Total lbs lost to date: 21 Total lbs lost since last in-office visit: 0  Interim History: Patient finally started the 1.5mg  of Trulicity 3 days ago. She is doing a combo of Pescatarian and Category 2 and only veered off plan 6 times since last appointment.  Her garden has really taken off and so she has to spend less time now outside. She may have a cookout at her house for 4th of July.  She is going to try to be more in control of her food choices and intake.  She doesn't think she was getting all the nutrition in daily.   Subjective:   1. Essential hypertension Patient's blood pressure is elevated today. She is on losartan 100 mg and amlodipine 5 mg now after changed by Dr. Laury Axon. She denies chest pain, chest pressure, or headache.   2. Elevated random blood glucose level Patient is on Trulicity weekly. No GI side effects were noted.   Assessment/Plan:   1. Essential hypertension We will follow-up on her blood pressure at her next appointment.   2. Elevated random blood glucose level We will refill Trulicity 1.5 mg SubQ weekly for 1 month.   - TRULICITY 1.5 MG/0.5ML SOPN; Inject 1.5 mg into the skin once a week.  Dispense: 2 mL; Refill: 0  3. BMI 30.0-30.9,adult  4. Obesity with starting BMI of 34.2 Alicia Knapp is currently in the action stage of change. As such, her goal is to continue with weight loss efforts. She has agreed to the Category 2 Plan and  the Pescatarian Plan.   Exercise goals: All adults should avoid inactivity. Some physical activity is better than none, and adults who participate in any amount of physical activity gain some health benefits. Patient plans to get back to consistent physical activity.   Behavioral modification strategies: increasing lean protein intake, meal planning and cooking strategies, keeping healthy foods in the home, and planning for success.  Alicia Knapp has agreed to follow-up with our clinic in 4 to 5 weeks. She was informed of the importance of frequent follow-up visits to maximize her success with intensive lifestyle modifications for her multiple health conditions.   Objective:   Blood pressure (!) 168/82, pulse 64, temperature 98.8 F (37.1 C), height 5\' 6"  (1.676 m), weight 191 lb (86.6 kg), SpO2 100 %. Body mass index is 30.83 kg/m.  General: Cooperative, alert, well developed, in no acute distress. HEENT: Conjunctivae and lids unremarkable. Cardiovascular: Regular rhythm.  Lungs: Normal work of breathing. Neurologic: No focal deficits.   Lab Results  Component Value Date   CREATININE 0.79 01/21/2023   BUN 11 01/21/2023   NA 142 01/21/2023   K 4.9 01/21/2023   CL 104 01/21/2023   CO2 23 01/21/2023   Lab Results  Component Value Date   ALT 14 01/21/2023   AST 15 01/21/2023   ALKPHOS 83 01/21/2023   BILITOT 0.3 01/21/2023   Lab Results  Component Value Date  HGBA1C 5.5 01/21/2023   HGBA1C 5.5 07/22/2022   HGBA1C 5.5 04/01/2022   HGBA1C 5.5 10/01/2021   HGBA1C 5.3 12/25/2020   Lab Results  Component Value Date   INSULIN 16.1 01/21/2023   INSULIN 19.3 07/22/2022   INSULIN 11.2 12/25/2020   INSULIN 20.6 08/08/2020   INSULIN 10.4 08/16/2019   Lab Results  Component Value Date   TSH 1.63 10/01/2021   Lab Results  Component Value Date   CHOL 148 01/21/2023   HDL 50 01/21/2023   LDLCALC 87 01/21/2023   LDLDIRECT 140.5 08/12/2013   TRIG 54 01/21/2023   CHOLHDL 4  04/01/2022   Lab Results  Component Value Date   VD25OH 90.7 01/21/2023   VD25OH 43.2 07/22/2022   VD25OH 35.71 04/01/2022   Lab Results  Component Value Date   WBC 3.4 (L) 10/01/2021   HGB 11.9 (L) 10/01/2021   HCT 36.5 10/01/2021   MCV 86.9 10/01/2021   PLT 340.0 10/01/2021   No results found for: "IRON", "TIBC", "FERRITIN"  Attestation Statements:   Reviewed by clinician on day of visit: allergies, medications, problem list, medical history, surgical history, family history, social history, and previous encounter notes.   I, Burt Knack, am acting as transcriptionist for Reuben Likes, MD.  I have reviewed the above documentation for accuracy and completeness, and I agree with the above. - Reuben Likes, MD

## 2023-04-26 ENCOUNTER — Other Ambulatory Visit (INDEPENDENT_AMBULATORY_CARE_PROVIDER_SITE_OTHER): Payer: Self-pay | Admitting: Family Medicine

## 2023-04-26 DIAGNOSIS — E559 Vitamin D deficiency, unspecified: Secondary | ICD-10-CM

## 2023-04-30 ENCOUNTER — Encounter: Payer: Self-pay | Admitting: Family Medicine

## 2023-04-30 MED ORDER — CARVEDILOL 12.5 MG PO TABS: 12.5000 mg | ORAL_TABLET | Freq: Two times a day (BID) | ORAL | 1 refills | Status: DC

## 2023-05-14 ENCOUNTER — Encounter: Payer: Self-pay | Admitting: Family Medicine

## 2023-05-14 ENCOUNTER — Ambulatory Visit: Payer: Medicare PPO | Admitting: Family Medicine

## 2023-05-14 DIAGNOSIS — I10 Essential (primary) hypertension: Secondary | ICD-10-CM | POA: Diagnosis not present

## 2023-05-14 MED ORDER — AMLODIPINE BESYLATE 5 MG PO TABS: 5.0000 mg | ORAL_TABLET | Freq: Every day | ORAL | 3 refills | Status: DC

## 2023-05-14 MED ORDER — LOSARTAN POTASSIUM 100 MG PO TABS: 100.0000 mg | ORAL_TABLET | Freq: Every day | ORAL | 3 refills | Status: DC

## 2023-05-14 MED ORDER — CARVEDILOL 12.5 MG PO TABS: 12.5000 mg | ORAL_TABLET | Freq: Two times a day (BID) | ORAL | 1 refills | Status: DC

## 2023-05-14 NOTE — Assessment & Plan Note (Signed)
Poorly controlled but runs better at home, encouraged DASH diet, minimize caffeine and obtain adequate sleep. Report concerning symptoms and follow up as directed and as needed

## 2023-05-14 NOTE — Patient Instructions (Signed)

## 2023-05-14 NOTE — Progress Notes (Signed)
Established Patient Office Visit  Subjective   Patient ID: Alicia Knapp, female    DOB: 1956/06/28  Age: 67 y.o. MRN: [id number]  Chief Complaint  Patient presents with   Blood pressure follow up    HPI Discussed the use of AI scribe software for clinical note transcription with the patient, who gave verbal consent to proceed.  History of Present Illness   The patient, with a history of hypertension, presents with concerns about her blood pressure. She reports feeling 'nervous' during the visit, which may be contributing to elevated readings in the office. Despite this, she reports feeling '100% better' overall and has not experienced any headaches or other symptoms typically associated with hypertension. She has been monitoring her blood pressure at home, where readings have been consistently around 120-130/70-80. She has been taking her prescribed medications, including Cozaar 100mg  and a diuretic, as directed. She also takes carvedilol twice daily. She reports a good understanding of her medication regimen and is careful to take her medications as prescribed. She has been making efforts to reduce her salt intake and consume less processed foods. She also expresses some anxiety about an upcoming colonoscopy.      Patient Active Problem List   Diagnosis Date Noted   Need for pneumococcal 20-valent conjugate vaccination 04/03/2023   Bilateral impacted cerumen 10/10/2022   Welcome to Medicare preventive visit 10/03/2022   COVID-19 03/11/2022   Prediabetes 03/06/2022   At risk for diarrhea 03/06/2022   Allergic contact dermatitis due to plants, except food 03/26/2021   Great toe pain, left 09/05/2019   Insulin resistance 11/23/2018   Vitamin D deficiency 11/10/2018   B12 nutritional deficiency 11/10/2018   Class 1 obesity with serious comorbidity and body mass index (BMI) of 30.0 to 30.9 in adult 09/02/2018   Chest pain 11/21/2015   OTHER ACUTE SINUSITIS 08/26/2010   Hyperlipidemia  07/26/2010   Seasonal allergic rhinitis 04/16/2009   ANXIETY 01/04/2008   Essential hypertension 01/04/2008   CARDIAC MURMUR 01/04/2008   Past Medical History:  Diagnosis Date   Anxiety    B12 deficiency    Constipation    Heart murmur    Hyperlipidemia    Hypertension    Joint pain    Postoperative nausea    Prediabetes    Swelling    lower ext   Vitamin D deficiency    Past Surgical History:  Procedure Laterality Date   ABDOMINAL HYSTERECTOMY     APPENDECTOMY     COLONOSCOPY  05/22/2015   Dr.Pyrtle   POLYPECTOMY     Social History   Tobacco Use   Smoking status: Former    Current packs/day: 0.00    Types: Cigarettes    Quit date: 09/17/2014    Years since quitting: 8.6   Smokeless tobacco: Never  Vaping Use   Vaping status: Never Used  Substance Use Topics   Alcohol use: No    Alcohol/week: 0.0 standard drinks of alcohol   Drug use: No   Social History   Socioeconomic History   Marital status: Married    Spouse name: Marnette Burgess   Number of children: Not on file   Years of education: Not on file   Highest education level: Not on file  Occupational History   Occupation: Stay at home spouse  Tobacco Use   Smoking status: Former    Current packs/day: 0.00    Types: Cigarettes    Quit date: 09/17/2014    Years since quitting: 8.6  Smokeless tobacco: Never  Vaping Use   Vaping status: Never Used  Substance and Sexual Activity   Alcohol use: No    Alcohol/week: 0.0 standard drinks of alcohol   Drug use: No   Sexual activity: Yes    Partners: Male  Other Topics Concern   Not on file  Social History Narrative   Lives in house with her husband   Social Determinants of Health   Financial Resource Strain: Not on file  Food Insecurity: Not on file  Transportation Needs: Not on file  Physical Activity: Not on file  Stress: Not on file  Social Connections: Not on file  Intimate Partner Violence: Not on file   Family Status  Relation Name  Status   Father  Deceased   Mother  Deceased   Sister  Alive   Brother  Alive   Brother Derek Deceased   MGM  (Not Specified)   Neg Hx  (Not Specified)  No partnership data on file   Family History  Problem Relation Age of Onset   Cancer Father        prostate   Prostate cancer Father    Diabetes Mother    Thyroid disease Mother    Hypertension Mother    Hyperlipidemia Mother    Kidney disease Mother    Cancer Mother    Sleep apnea Mother    Obesity Mother    Hypertension Sister    Hypertension Brother    Stroke Brother    Colon cancer Brother 63       died 96 - 4-5 months after dx   Arthritis Maternal Grandmother    Esophageal cancer Neg Hx    Rectal cancer Neg Hx    Stomach cancer Neg Hx    Allergies  Allergen Reactions   Erythromycin     Causes stomach cramps   Penicillins     rash      Review of Systems  Constitutional:  Negative for fever and malaise/fatigue.  HENT:  Negative for congestion.   Eyes:  Negative for blurred vision.  Respiratory:  Negative for cough and shortness of breath.   Cardiovascular:  Negative for chest pain, palpitations and leg swelling.  Gastrointestinal:  Negative for abdominal pain, blood in stool, nausea and vomiting.  Genitourinary:  Negative for dysuria and frequency.  Musculoskeletal:  Negative for back pain and falls.  Skin:  Negative for rash.  Neurological:  Negative for dizziness, loss of consciousness and headaches.  Endo/Heme/Allergies:  Negative for environmental allergies.  Psychiatric/Behavioral:  Negative for depression. The patient is not nervous/anxious.       Objective:     BP (!) 158/80   Pulse 68   Ht 5\' 6"  (1.676 m)   Wt 194 lb 6.4 oz (88.2 kg)   SpO2 100%   BMI 31.38 kg/m  BP Readings from Last 3 Encounters:  05/14/23 (!) 158/80  04/22/23 (!) 168/82  04/03/23 (!) 142/84   Wt Readings from Last 3 Encounters:  05/14/23 194 lb 6.4 oz (88.2 kg)  04/22/23 191 lb (86.6 kg)  04/20/23 189 lb (85.7  kg)   SpO2 Readings from Last 3 Encounters:  05/14/23 100%  04/22/23 100%  04/03/23 98%      Physical Exam Vitals and nursing note reviewed.  Constitutional:      General: She is not in acute distress.    Appearance: Normal appearance. She is well-developed.  HENT:     Head: Normocephalic and atraumatic.  Eyes:  General: No scleral icterus.       Right eye: No discharge.        Left eye: No discharge.  Cardiovascular:     Rate and Rhythm: Normal rate and regular rhythm.     Heart sounds: No murmur heard. Pulmonary:     Effort: Pulmonary effort is normal. No respiratory distress.     Breath sounds: Normal breath sounds.  Musculoskeletal:        General: Normal range of motion.     Cervical back: Normal range of motion and neck supple.     Right lower leg: No edema.     Left lower leg: No edema.  Skin:    General: Skin is warm and dry.  Neurological:     Mental Status: She is alert and oriented to person, place, and time.  Psychiatric:        Mood and Affect: Mood normal.        Behavior: Behavior normal.        Thought Content: Thought content normal.        Judgment: Judgment normal.      No results found for any visits on 05/14/23.  Last CBC Lab Results  Component Value Date   WBC 3.4 (L) 10/01/2021   HGB 11.9 (L) 10/01/2021   HCT 36.5 10/01/2021   MCV 86.9 10/01/2021   RDW 13.5 10/01/2021   PLT 340.0 10/01/2021   Last metabolic panel Lab Results  Component Value Date   GLUCOSE 83 01/21/2023   NA 142 01/21/2023   K 4.9 01/21/2023   CL 104 01/21/2023   CO2 23 01/21/2023   BUN 11 01/21/2023   CREATININE 0.79 01/21/2023   EGFR 83 01/21/2023   CALCIUM 10.1 01/21/2023   PROT 7.7 01/21/2023   ALBUMIN 4.5 01/21/2023   LABGLOB 3.2 01/21/2023   AGRATIO 1.4 01/21/2023   BILITOT 0.3 01/21/2023   ALKPHOS 83 01/21/2023   AST 15 01/21/2023   ALT 14 01/21/2023   Last lipids Lab Results  Component Value Date   CHOL 148 01/21/2023   HDL 50  01/21/2023   LDLCALC 87 01/21/2023   LDLDIRECT 140.5 08/12/2013   TRIG 54 01/21/2023   CHOLHDL 4 04/01/2022   Last hemoglobin A1c Lab Results  Component Value Date   HGBA1C 5.5 01/21/2023   Last thyroid functions Lab Results  Component Value Date   TSH 1.63 10/01/2021   T3TOTAL 105 04/08/2019   Last vitamin D Lab Results  Component Value Date   VD25OH 90.7 01/21/2023   Last vitamin B12 and Folate Lab Results  Component Value Date   VITAMINB12 471 04/08/2019   FOLATE 7.6 04/08/2019      The 10-year ASCVD risk score (Arnett DK, et al., 2019) is: 11.6%    Assessment & Plan:   Problem List Items Addressed This Visit       Unprioritized   Essential hypertension    Poorly controlled but runs better at home, encouraged DASH diet, minimize caffeine and obtain adequate sleep. Report concerning symptoms and follow up as directed and as needed       Relevant Medications   amLODipine (NORVASC) 5 MG tablet   carvedilol (COREG) 12.5 MG tablet   losartan (COZAAR) 100 MG tablet    No follow-ups on file.    Donato Schultz, DO

## 2023-05-18 ENCOUNTER — Ambulatory Visit (AMBULATORY_SURGERY_CENTER): Payer: Medicare PPO | Admitting: Internal Medicine

## 2023-05-18 ENCOUNTER — Encounter: Payer: Self-pay | Admitting: Internal Medicine

## 2023-05-18 VITALS — BP 119/65 | HR 54 | Temp 98.0°F | Resp 14 | Ht 66.0 in | Wt 189.0 lb

## 2023-05-18 DIAGNOSIS — D123 Benign neoplasm of transverse colon: Secondary | ICD-10-CM

## 2023-05-18 DIAGNOSIS — K635 Polyp of colon: Secondary | ICD-10-CM | POA: Diagnosis not present

## 2023-05-18 DIAGNOSIS — Z09 Encounter for follow-up examination after completed treatment for conditions other than malignant neoplasm: Secondary | ICD-10-CM | POA: Diagnosis not present

## 2023-05-18 DIAGNOSIS — D128 Benign neoplasm of rectum: Secondary | ICD-10-CM | POA: Diagnosis not present

## 2023-05-18 DIAGNOSIS — Z86010 Personal history of colonic polyps: Secondary | ICD-10-CM | POA: Diagnosis not present

## 2023-05-18 DIAGNOSIS — D124 Benign neoplasm of descending colon: Secondary | ICD-10-CM | POA: Diagnosis not present

## 2023-05-18 DIAGNOSIS — I10 Essential (primary) hypertension: Secondary | ICD-10-CM | POA: Diagnosis not present

## 2023-05-18 DIAGNOSIS — D122 Benign neoplasm of ascending colon: Secondary | ICD-10-CM

## 2023-05-18 DIAGNOSIS — Z800 Family history of malignant neoplasm of digestive organs: Secondary | ICD-10-CM

## 2023-05-18 DIAGNOSIS — K626 Ulcer of anus and rectum: Secondary | ICD-10-CM | POA: Diagnosis not present

## 2023-05-18 DIAGNOSIS — E119 Type 2 diabetes mellitus without complications: Secondary | ICD-10-CM | POA: Diagnosis not present

## 2023-05-18 MED ORDER — SODIUM CHLORIDE 0.9 % IV SOLN: 500.0000 mL | Freq: Once | INTRAVENOUS | Status: DC

## 2023-05-18 NOTE — Op Note (Signed)
[facility] Patient Name: Alicia Knapp Procedure Date: 05/18/2023 10:03 AM MRN: [id number] Endoscopist: Beverley Fiedler , MD, [id number] Age: 67 Referring MD:  Date of Birth: December 19, 1955 Gender: Female Account #: [id number] Procedure:                Colonoscopy Indications:              High risk colon cancer surveillance: Personal                            history of multiple adenomas and SSPs, Family                            history of colon cancer in a first-degree relative                            before age 32 years, Last colonoscopy: August 2019                            (2 adenomas and 2 SSPs) Medicines:                Monitored Anesthesia Care Procedure:                Pre-Anesthesia Assessment:                           - Prior to the procedure, a History and Physical                            was performed, and patient medications and                            allergies were reviewed. The patient's tolerance of                            previous anesthesia was also reviewed. The risks                            and benefits of the procedure and the sedation                            options and risks were discussed with the patient.                            All questions were answered, and informed consent                            was obtained. Prior Anticoagulants: The patient has                            taken no anticoagulant or antiplatelet agents. ASA                            Grade Assessment: II - A patient with mild systemic  disease. After reviewing the risks and benefits,                            the patient was deemed in satisfactory condition to                            undergo the procedure.                           After obtaining informed consent, the colonoscope                            was passed under direct vision. Throughout the                            procedure, the patient's blood pressure,  pulse, and                            oxygen saturations were monitored continuously. The                            Olympus CF-HQ190L [id number]) Colonoscope was                            introduced through the anus and advanced to the                            cecum, identified by transillumination. The                            colonoscopy was performed without difficulty. The                            patient tolerated the procedure well. The quality                            of the bowel preparation was good. The ileocecal                            valve, appendiceal orifice, and rectum were                            photographed. Scope In: 10:10:59 AM Scope Out: 10:34:28 AM Scope Withdrawal Time: 0 hours 18 minutes 37 seconds  Total Procedure Duration: 0 hours 23 minutes 29 seconds  Findings:                 The digital rectal exam was normal.                           A 4 mm polyp was found in the ascending colon. The                            polyp was sessile. The polyp was removed with a  cold snare. Resection and retrieval were complete.                           A 5 mm polyp was found in the transverse colon. The                            polyp was sessile. The polyp was removed with a                            cold snare. Resection and retrieval were complete.                           Two sessile polyps were found in the descending                            colon. The polyps were 4 to 5 mm in size. These                            polyps were removed with a cold snare. Resection                            and retrieval were complete.                           A 5 mm polyp versus inflamed hemorrhoid was found                            in the distal rectum at dentate line. Biopsies were                            taken with a cold forceps for histology (to exclude                            adenoma and AIN).                            Multiple large-mouthed, medium-mouthed and                            small-mouthed diverticula were found in the sigmoid                            colon, descending colon, transverse colon and                            ascending colon.                           Internal hemorrhoids were found during                            retroflexion. The hemorrhoids were medium-sized. Complications:            No immediate complications. Estimated Blood Loss:  Estimated blood loss was minimal. Impression:               - One 4 mm polyp in the ascending colon, removed                            with a cold snare. Resected and retrieved.                           - One 5 mm polyp in the transverse colon, removed                            with a cold snare. Resected and retrieved.                           - Two 4 to 5 mm polyps in the descending colon,                            removed with a cold snare. Resected and retrieved.                           - One 5 mm polyp in the distal rectum/anal anal.                            Biopsied to exclude adenoma and AIN.                           - Moderate diverticulosis in the sigmoid colon, in                            the descending colon, in the transverse colon and                            in the ascending colon.                           - Internal hemorrhoids. Recommendation:           - Patient has a contact number available for                            emergencies. The signs and symptoms of potential                            delayed complications were discussed with the                            patient. Return to normal activities tomorrow.                            Written discharge instructions were provided to the                            patient.                           -  Resume previous diet.                           - Continue present medications.                           - Await pathology results.                            - Repeat colonoscopy is recommended for                            surveillance. The colonoscopy date will be                            determined after pathology results from today's                            exam become available for review. Beverley Fiedler, MD 05/18/2023 10:41:18 AM This report has been signed electronically.

## 2023-05-18 NOTE — Progress Notes (Signed)
 Report to PACU, RN, vss, BBS= Clear.  

## 2023-05-18 NOTE — Progress Notes (Signed)
 Called to room to assist during endoscopic procedure.  Patient ID and intended procedure confirmed with present staff. Received instructions for my participation in the procedure from the performing physician.  

## 2023-05-18 NOTE — Patient Instructions (Addendum)
Recommendation: Patient has a contact number available for                            emergencies. The signs and symptoms of potential                            delayed complications were discussed with the                            patient. Return to normal activities tomorrow.                            Written discharge instructions were provided to the                            patient.                           - Resume previous diet.                           - Continue present medications.                           - Await pathology results.                           - Repeat colonoscopy is recommended for                            surveillance. The colonoscopy date will be                            determined after pathology results from today's                            exam become available for review.  Handouts on polyps, hemorrhoids and diverticulosis given.  YOU HAD AN ENDOSCOPIC PROCEDURE TODAY AT THE [facility]:   Refer to the procedure report that was given to you for any specific questions about what was found during the examination.  If the procedure report does not answer your questions, please call your gastroenterologist to clarify.  If you requested that your care partner not be given the details of your procedure findings, then the procedure report has been included in a sealed envelope for you to review at your convenience later.  YOU SHOULD EXPECT: Some feelings of bloating in the abdomen. Passage of more gas than usual.  Walking can help get rid of the air that was put into your GI tract during the procedure and reduce the bloating. If you had a lower endoscopy (such as a colonoscopy or flexible sigmoidoscopy) you may notice spotting of blood in your stool or on the toilet paper. If you underwent a bowel prep for your procedure, you may not have a normal bowel movement for a few days.  Please Note:  You might notice some irritation and congestion in  your nose or some drainage.  This is from the oxygen used during your  procedure.  There is no need for concern and it should clear up in a day or so.  SYMPTOMS TO REPORT IMMEDIATELY:  Following lower endoscopy (colonoscopy or flexible sigmoidoscopy):  Excessive amounts of blood in the stool  Significant tenderness or worsening of abdominal pains  Swelling of the abdomen that is new, acute  Fever of 100F or higher  For urgent or emergent issues, a gastroenterologist can be reached at any hour by calling [phone]. Do not use MyChart messaging for urgent concerns.   DIET:  We do recommend a small meal at first, but then you may proceed to your regular diet.  Drink plenty of fluids but you should avoid alcoholic beverages for 24 hours.  ACTIVITY:  You should plan to take it easy for the rest of today and you should NOT DRIVE or use heavy machinery until tomorrow (because of the sedation medicines used during the test).    FOLLOW UP: Our staff will call the number listed on your records the next business day following your procedure.  We will call around 7:15- 8:00 am to check on you and address any questions or concerns that you may have regarding the information given to you following your procedure. If we do not reach you, we will leave a message.     If any biopsies were taken you will be contacted by phone or by letter within the next 1-3 weeks.  Please call us at [phone] if you have not heard about the biopsies in 3 weeks.    SIGNATURES/CONFIDENTIALITY: You and/or your care partner have signed paperwork which will be entered into your electronic medical record.  These signatures attest to the fact that that the information above on your After Visit Summary has been reviewed and is understood.  Full responsibility of the confidentiality of this discharge information lies with you and/or your care-partner.

## 2023-05-18 NOTE — Progress Notes (Signed)
 Pt's states no medical or surgical changes since previsit or office visit. 

## 2023-05-18 NOTE — Progress Notes (Signed)
GASTROENTEROLOGY PROCEDURE H&P NOTE   Primary Care Physician: Donato Schultz, DO    Reason for Procedure:  Personal history of SSP's and adenomas, family history of colon cancer in patient's brother  Plan:    Colonoscopy  Patient is appropriate for endoscopic procedure(s) in the ambulatory ([facility]) setting.  The nature of the procedure, as well as the risks, benefits, and alternatives were carefully and thoroughly reviewed with the patient. Ample time for discussion and questions allowed. The patient understood, was satisfied, and agreed to proceed.     HPI: Alicia Knapp is a 67 y.o. female who presents for surveillance anoscopy.  Medical history as below.  Tolerated the prep.  No recent chest pain or shortness of breath.  No abdominal pain today.  Past Medical History:  Diagnosis Date   Anxiety    B12 deficiency    Constipation    Heart murmur    Hyperlipidemia    Hypertension    Joint pain    Postoperative nausea    Prediabetes    Swelling    lower ext   Vitamin D deficiency     Past Surgical History:  Procedure Laterality Date   ABDOMINAL HYSTERECTOMY     APPENDECTOMY     COLONOSCOPY  05/22/2015   Dr.Britiney Blahnik   POLYPECTOMY      Prior to Admission medications   Medication Sig Start Date End Date Taking? Authorizing Provider  amLODipine (NORVASC) 5 MG tablet Take 1 tablet (5 mg total) by mouth daily. 05/14/23  Yes Seabron Spates R, DO  ARIPiprazole (ABILIFY) 2 MG tablet Take 2 mg by mouth daily.  05/17/18  Yes [provider]  ARIPiprazole (ABILIFY) 5 MG tablet Take 1 tablet by mouth daily. 03/31/18  Yes [provider]  atorvastatin (LIPITOR) 10 MG tablet Take 1 tablet (10 mg total) by mouth daily. 04/03/23  Yes Seabron Spates R, DO  carvedilol (COREG) 12.5 MG tablet Take 1 tablet (12.5 mg total) by mouth 2 (two) times daily with a meal. 05/14/23  Yes Lowne Chase, Yvonne R, DO  fluticasone (FLONASE) 50 MCG/ACT nasal spray Place 2  sprays into both nostrils daily. 04/03/23  Yes Seabron Spates R, DO  hydrochlorothiazide (HYDRODIURIL) 25 MG tablet Take 1 tablet (25 mg total) by mouth daily. 04/03/23  Yes Donato Schultz, DO  levocetirizine (XYZAL) 5 MG tablet Take 1 tablet (5 mg total) by mouth every evening. 04/03/23  Yes Zola Button, Grayling Congress, DO  losartan (COZAAR) 100 MG tablet Take 1 tablet (100 mg total) by mouth daily. 05/14/23  Yes Seabron Spates R, DO  metFORMIN (GLUCOPHAGE) 500 MG tablet Take 1 tablet (500 mg total) by mouth 2 (two) times daily with a meal. 03/25/23  Yes Langston Reusing, MD  TRULICITY 1.5 MG/0.5ML SOPN Inject 1.5 mg into the skin once a week. 04/22/23   Langston Reusing, MD    Current Outpatient Medications  Medication Sig Dispense Refill   amLODipine (NORVASC) 5 MG tablet Take 1 tablet (5 mg total) by mouth daily. 90 tablet 3   ARIPiprazole (ABILIFY) 2 MG tablet Take 2 mg by mouth daily.      ARIPiprazole (ABILIFY) 5 MG tablet Take 1 tablet by mouth daily.  0   atorvastatin (LIPITOR) 10 MG tablet Take 1 tablet (10 mg total) by mouth daily. 90 tablet 3   carvedilol (COREG) 12.5 MG tablet Take 1 tablet (12.5 mg total) by mouth 2 (two) times daily with  a meal. 180 tablet 1   fluticasone (FLONASE) 50 MCG/ACT nasal spray Place 2 sprays into both nostrils daily. 48 g 3   hydrochlorothiazide (HYDRODIURIL) 25 MG tablet Take 1 tablet (25 mg total) by mouth daily. 90 tablet 3   levocetirizine (XYZAL) 5 MG tablet Take 1 tablet (5 mg total) by mouth every evening. 90 tablet 3   losartan (COZAAR) 100 MG tablet Take 1 tablet (100 mg total) by mouth daily. 90 tablet 3   metFORMIN (GLUCOPHAGE) 500 MG tablet Take 1 tablet (500 mg total) by mouth 2 (two) times daily with a meal. 180 tablet 0   TRULICITY 1.5 MG/0.5ML SOPN Inject 1.5 mg into the skin once a week. 2 mL 0   Current Facility-Administered Medications  Medication Dose Route Frequency Provider Last Rate Last Admin   0.9 %  sodium  chloride infusion  500 mL Intravenous Once Tesha Archambeau, Carie Caddy, MD        Allergies as of 05/18/2023 - Review Complete 05/18/2023  Allergen Reaction Noted   Erythromycin  08/26/2010   Penicillins      Family History  Problem Relation Age of Onset   Cancer Father        prostate   Prostate cancer Father    Diabetes Mother    Thyroid disease Mother    Hypertension Mother    Hyperlipidemia Mother    Kidney disease Mother    Cancer Mother    Sleep apnea Mother    Obesity Mother    Hypertension Sister    Hypertension Brother    Stroke Brother    Colon cancer Brother 63       died 42 - 4-5 months after dx   Arthritis Maternal Grandmother    Esophageal cancer Neg Hx    Rectal cancer Neg Hx    Stomach cancer Neg Hx     Social History   Socioeconomic History   Marital status: Married    Spouse name: Marnette Burgess   Number of children: Not on file   Years of education: Not on file   Highest education level: Not on file  Occupational History   Occupation: Stay at home spouse  Tobacco Use   Smoking status: Former    Current packs/day: 0.00    Types: Cigarettes    Quit date: 09/17/2014    Years since quitting: 8.6   Smokeless tobacco: Never  Vaping Use   Vaping status: Never Used  Substance and Sexual Activity   Alcohol use: No    Alcohol/week: 0.0 standard drinks of alcohol   Drug use: No   Sexual activity: Yes    Partners: Male    Birth control/protection: Post-menopausal  Other Topics Concern   Not on file  Social History Narrative   Lives in house with her husband   Social Determinants of Health   Financial Resource Strain: Not on file  Food Insecurity: Not on file  Transportation Needs: Not on file  Physical Activity: Not on file  Stress: Not on file  Social Connections: Not on file  Intimate Partner Violence: Not on file    Physical Exam: Vital signs in last 24 hours: @BP  (!) 194/100   Pulse 64   Temp 98 F (36.7 C) (Temporal)   Ht 5\' 6"  (1.676 m)    Wt 189 lb (85.7 kg)   SpO2 100%   BMI 30.51 kg/m  GEN: NAD EYE: Sclerae anicteric ENT: MMM CV: Non-tachycardic Pulm: CTA b/l GI: Soft, NT/ND NEURO:  Alert &  Oriented x 3   Erick Blinks, MD [facility]  05/18/2023 10:02 AM

## 2023-05-19 ENCOUNTER — Telehealth: Payer: Self-pay

## 2023-05-19 NOTE — Telephone Encounter (Signed)
  Follow up Call-     05/18/2023    9:26 AM  Call back number  Post procedure Call Back phone  # [phone]  Permission to leave phone message Yes     Patient questions:  Do you have a fever, pain , or abdominal swelling? No. Pain Score  0 *  Have you tolerated food without any problems? Yes.    Have you been able to return to your normal activities? Yes.    Do you have any questions about your discharge instructions: Diet   No. Medications  No. Follow up visit  No.  Do you have questions or concerns about your Care? No.  Actions: * If pain score is 4 or above: No action needed, pain <4.

## 2023-05-20 ENCOUNTER — Encounter: Payer: Self-pay | Admitting: Internal Medicine

## 2023-05-25 ENCOUNTER — Encounter (INDEPENDENT_AMBULATORY_CARE_PROVIDER_SITE_OTHER): Payer: Self-pay | Admitting: Family Medicine

## 2023-05-25 ENCOUNTER — Ambulatory Visit (INDEPENDENT_AMBULATORY_CARE_PROVIDER_SITE_OTHER): Payer: Medicare PPO | Admitting: Family Medicine

## 2023-05-25 VITALS — BP 170/92 | HR 69 | Temp 98.4°F | Ht 66.0 in | Wt 191.0 lb

## 2023-05-25 DIAGNOSIS — Z6830 Body mass index (BMI) 30.0-30.9, adult: Secondary | ICD-10-CM | POA: Diagnosis not present

## 2023-05-25 DIAGNOSIS — E669 Obesity, unspecified: Secondary | ICD-10-CM

## 2023-05-25 DIAGNOSIS — I10 Essential (primary) hypertension: Secondary | ICD-10-CM | POA: Diagnosis not present

## 2023-05-25 DIAGNOSIS — R7309 Other abnormal glucose: Secondary | ICD-10-CM | POA: Diagnosis not present

## 2023-05-25 MED ORDER — TRULICITY 1.5 MG/0.5ML ~~LOC~~ SOAJ
1.5000 mg | SUBCUTANEOUS | 0 refills | Status: DC
Start: 2023-05-25 — End: 2023-06-22

## 2023-05-25 MED ORDER — METFORMIN HCL 500 MG PO TABS
500.0000 mg | ORAL_TABLET | Freq: Two times a day (BID) | ORAL | 0 refills | Status: DC
Start: 2023-05-25 — End: 2023-07-27

## 2023-05-25 NOTE — Progress Notes (Unsigned)
Chief Complaint:   OBESITY Alicia Knapp is here to discuss her progress with her obesity treatment plan along with follow-up of her obesity related diagnoses. Alicia Knapp is on the Category 2 Plan and the Pescatarian Plan and states she is following her eating plan approximately 75% of the time. Alicia Knapp states she is lifting weights, core, and cycling for 10-30 minutes 1-2 times per week.  Today's visit was #: 79 Starting weight: 212 lbs Starting date: 08/03/2018 Today's weight: 191 lbs Today's date: 05/25/2023 Total lbs lost to date: 21 Total lbs lost since last in-office visit: 0  Interim History: Patient voices that her blood pressure has been consistently elevated and Dr. Laury Axon has been managing it.  She doesn't understand why her BP is so labile.  She mentions she doesn't feel anymore stressed than she did previously.  Her garden is doing well. She has been 75% on plan.  She didn't eat for 2 days for her colonoscopy and then thinks she may have overeaten.   Subjective:   1. Elevated random blood glucose level Patient's last insulin level was elevated on her last labs. She notes satiety with Trulicity.   2. Essential hypertension Patient's blood pressure is elevated today. She has had a recent change in her blood pressure medications. She denies chest pain, chest pressure, or headache.   Assessment/Plan:   1. Elevated random blood glucose level Patient will continue her medications, and we will refill Trulicity 1.5 mg for 1 month and metformin 500 mg BID for 90 days.   - metFORMIN (GLUCOPHAGE) 500 MG tablet; Take 1 tablet (500 mg total) by mouth 2 (two) times daily with a meal.  Dispense: 180 tablet; Refill: 0 - TRULICITY 1.5 MG/0.5ML SOPN; Inject 1.5 mg into the skin once a week.  Dispense: 2 mL; Refill: 0  2. Essential hypertension Patient will continue her current medications with no change in medication or doses. She is to continue to monitor her blood pressure.   3. BMI  30.0-30.9,adult  4. Obesity with starting BMI of 34.2 Alicia Knapp is currently in the action stage of change. As such, her goal is to continue with weight loss efforts. She has agreed to the Category 2 Plan and the Pescatarian Plan.   Exercise goals: All adults should avoid inactivity. Some physical activity is better than none, and adults who participate in any amount of physical activity gain some health benefits.  Behavioral modification strategies: increasing lean protein intake, meal planning and cooking strategies, keeping healthy foods in the home, and planning for success.  Alicia Knapp has agreed to follow-up with our clinic in 4 weeks. She was informed of the importance of frequent follow-up visits to maximize her success with intensive lifestyle modifications for her multiple health conditions.   Objective:   Blood pressure (!) 170/92, pulse 69, temperature 98.4 F (36.9 C), height 5\' 6"  (1.676 m), SpO2 100%. Body mass index is 30.51 kg/m.  General: Cooperative, alert, well developed, in no acute distress. HEENT: Conjunctivae and lids unremarkable. Cardiovascular: Regular rhythm.  Lungs: Normal work of breathing. Neurologic: No focal deficits.   Lab Results  Component Value Date   CREATININE 0.79 01/21/2023   BUN 11 01/21/2023   NA 142 01/21/2023   K 4.9 01/21/2023   CL 104 01/21/2023   CO2 23 01/21/2023   Lab Results  Component Value Date   ALT 14 01/21/2023   AST 15 01/21/2023   ALKPHOS 83 01/21/2023   BILITOT 0.3 01/21/2023   Lab Results  Component Value Date   HGBA1C 5.5 01/21/2023   HGBA1C 5.5 07/22/2022   HGBA1C 5.5 04/01/2022   HGBA1C 5.5 10/01/2021   HGBA1C 5.3 12/25/2020   Lab Results  Component Value Date   INSULIN 16.1 01/21/2023   INSULIN 19.3 07/22/2022   INSULIN 11.2 12/25/2020   INSULIN 20.6 08/08/2020   INSULIN 10.4 08/16/2019   Lab Results  Component Value Date   TSH 1.63 10/01/2021   Lab Results  Component Value Date   CHOL 148 01/21/2023    HDL 50 01/21/2023   LDLCALC 87 01/21/2023   LDLDIRECT 140.5 08/12/2013   TRIG 54 01/21/2023   CHOLHDL 4 04/01/2022   Lab Results  Component Value Date   VD25OH 90.7 01/21/2023   VD25OH 43.2 07/22/2022   VD25OH 35.71 04/01/2022   Lab Results  Component Value Date   WBC 3.4 (L) 10/01/2021   HGB 11.9 (L) 10/01/2021   HCT 36.5 10/01/2021   MCV 86.9 10/01/2021   PLT 340.0 10/01/2021   No results found for: "IRON", "TIBC", "FERRITIN"  Attestation Statements:   Reviewed by clinician on day of visit: allergies, medications, problem list, medical history, surgical history, family history, social history, and previous encounter notes.   I, Alicia Knapp, am acting as transcriptionist for Alicia Likes, MD.  I have reviewed the above documentation for accuracy and completeness, and I agree with the above. - Alicia Likes, MD

## 2023-05-28 ENCOUNTER — Telehealth: Payer: Self-pay | Admitting: Family Medicine

## 2023-05-28 MED ORDER — AMLODIPINE BESYLATE 10 MG PO TABS: 10.0000 mg | ORAL_TABLET | Freq: Every day | ORAL | 0 refills | Status: DC

## 2023-05-28 NOTE — Telephone Encounter (Signed)
Pt called to see if Dr. Laury Axon can call in a new [facility] for her amlod[facility]ine since she is now on 10 mg ([facility]). Pt would like a prescription to be sent to [facility] on [address] to hold [facility] until [facility] delivery comes. Pt has 4 days left

## 2023-05-28 NOTE — Telephone Encounter (Signed)
Refill sent.

## 2023-05-28 NOTE — Addendum Note (Signed)
Addended by: Roxanne Gates on: 05/28/2023 01:48 PM   Modules accepted: Orders

## 2023-06-22 ENCOUNTER — Encounter (INDEPENDENT_AMBULATORY_CARE_PROVIDER_SITE_OTHER): Payer: Self-pay | Admitting: Family Medicine

## 2023-06-22 ENCOUNTER — Other Ambulatory Visit: Payer: Self-pay | Admitting: Family Medicine

## 2023-06-22 ENCOUNTER — Ambulatory Visit (INDEPENDENT_AMBULATORY_CARE_PROVIDER_SITE_OTHER): Payer: Medicare PPO | Admitting: Family Medicine

## 2023-06-22 VITALS — BP 150/77 | HR 65 | Temp 98.5°F | Ht 66.0 in | Wt 193.0 lb

## 2023-06-22 DIAGNOSIS — I10 Essential (primary) hypertension: Secondary | ICD-10-CM

## 2023-06-22 DIAGNOSIS — E669 Obesity, unspecified: Secondary | ICD-10-CM | POA: Diagnosis not present

## 2023-06-22 DIAGNOSIS — R7309 Other abnormal glucose: Secondary | ICD-10-CM | POA: Diagnosis not present

## 2023-06-22 DIAGNOSIS — Z6831 Body mass index (BMI) 31.0-31.9, adult: Secondary | ICD-10-CM | POA: Diagnosis not present

## 2023-06-22 MED ORDER — TRULICITY 3 MG/0.5ML ~~LOC~~ SOAJ: 3.0000 mg | SUBCUTANEOUS | 0 refills | Status: DC

## 2023-06-22 NOTE — Progress Notes (Signed)
Chief Complaint:   OBESITY Alicia Knapp is here to discuss her progress with her obesity treatment plan along with follow-up of her obesity related diagnoses. Alicia Knapp is on the Category 2 Plan and the Pescatarian Plan and states she is following her eating plan approximately 70% of the time. Alicia Knapp states she is doing strength, core, and cycling for 20-30 minutes 1-2 times per week.  Today's visit was #: 80 Starting weight: 212 lbs Starting date: 08/03/2018 Today's weight: 193 lbs Today's date: 06/22/2023 Total lbs lost to date: 19 Total lbs lost since last in-office visit: 0  Interim History: Patient has been preparing to go back to [facility] for [place] 1- 7th with her husband.  She is excited to go and spend time with just her husband.  She has been invited to a few cookouts and has eaten out more.  She has been less aware of the food choices she has been making.  She mentions she needs to leave the carbohydrates alone.  She recognizes she has let more potatoes creep into dinner.   Subjective:   1. Elevated random blood glucose level Patient is on Trulicity 1.5 mg weekly. No GI side effects were noted, but not nearly satiety or carbohydrate intake control that she had previously.   2. Essential hypertension Patient's blood pressure is controlled today. She mentions  some nerves with coming into the office today. Her blood pressure systolic in 130's at home on repeated testing.   Assessment/Plan:   1. Elevated random blood glucose level Patient agreed to increase Trulicity to 3 mg subcutaneous week, and we will refill for 90 days.   - Dulaglutide (TRULICITY) 3 MG/0.5ML SOPN; Inject 3 mg as directed once a week.  Dispense: 6 mL; Refill: 0  2. Essential hypertension We will follow-up on her blood pressure at patient's next appointment.   3. BMI 31.0-31.9,adult  4. Obesity with starting BMI of 34.2 Porfiria is currently in the action stage of change. As such, her goal is to continue with  weight loss efforts. She has agreed to the Category 2 Plan.   Exercise goals: As is.   Behavioral modification strategies: increasing lean protein intake, meal planning and cooking strategies, keeping healthy foods in the home, and planning for success.  Alicia Knapp has agreed to follow-up with our clinic in 4 to 5 weeks. She was informed of the importance of frequent follow-up visits to maximize her success with intensive lifestyle modifications for her multiple health conditions.   Objective:   Blood pressure (!) 150/77, pulse 65, temperature 98.5 F (36.9 C), height 5\' 6"  (1.676 m), weight 193 lb (87.5 kg), SpO2 100%. Body mass index is 31.15 kg/m.  General: Cooperative, alert, well developed, in no acute distress. HEENT: Conjunctivae and lids unremarkable. Cardiovascular: Regular rhythm.  Lungs: Normal work of breathing. Neurologic: No focal deficits.   Lab Results  Component Value Date   CREATININE 0.79 01/21/2023   BUN 11 01/21/2023   NA 142 01/21/2023   K 4.9 01/21/2023   CL 104 01/21/2023   CO2 23 01/21/2023   Lab Results  Component Value Date   ALT 14 01/21/2023   AST 15 01/21/2023   ALKPHOS 83 01/21/2023   BILITOT 0.3 01/21/2023   Lab Results  Component Value Date   HGBA1C 5.5 01/21/2023   HGBA1C 5.5 07/22/2022   HGBA1C 5.5 04/01/2022   HGBA1C 5.5 10/01/2021   HGBA1C 5.3 12/25/2020   Lab Results  Component Value Date   INSULIN 16.1 01/21/2023  INSULIN 19.3 07/22/2022   INSULIN 11.2 12/25/2020   INSULIN 20.6 08/08/2020   INSULIN 10.4 08/16/2019   Lab Results  Component Value Date   TSH 1.63 10/01/2021   Lab Results  Component Value Date   CHOL 148 01/21/2023   HDL 50 01/21/2023   LDLCALC 87 01/21/2023   LDLDIRECT 140.5 08/12/2013   TRIG 54 01/21/2023   CHOLHDL 4 04/01/2022   Lab Results  Component Value Date   VD25OH 90.7 01/21/2023   VD25OH 43.2 07/22/2022   VD25OH 35.71 04/01/2022   Lab Results  Component Value Date   WBC 3.4 (L)  10/01/2021   HGB 11.9 (L) 10/01/2021   HCT 36.5 10/01/2021   MCV 86.9 10/01/2021   PLT 340.0 10/01/2021   No results found for: "IRON", "TIBC", "FERRITIN"  Attestation Statements:   Reviewed by clinician on day of visit: allergies, medications, problem list, medical history, surgical history, family history, social history, and previous encounter notes.   I, Burt Knack, am acting as transcriptionist for Reuben Likes, MD.  I have reviewed the above documentation for accuracy and completeness, and I agree with the above. - Reuben Likes, MD

## 2023-06-22 NOTE — Telephone Encounter (Signed)
Please advise 

## 2023-06-23 MED ORDER — AMLODIPINE BESYLATE 10 MG PO TABS: 10.0000 mg | ORAL_TABLET | Freq: Every day | ORAL | 1 refills | Status: DC

## 2023-06-26 ENCOUNTER — Other Ambulatory Visit (INDEPENDENT_AMBULATORY_CARE_PROVIDER_SITE_OTHER): Payer: Self-pay | Admitting: Family Medicine

## 2023-06-26 DIAGNOSIS — E559 Vitamin D deficiency, unspecified: Secondary | ICD-10-CM

## 2023-07-13 DIAGNOSIS — F3174 Bipolar disorder, in full remission, most recent episode manic: Secondary | ICD-10-CM | POA: Diagnosis not present

## 2023-07-23 ENCOUNTER — Other Ambulatory Visit: Payer: Self-pay | Admitting: Family Medicine

## 2023-07-27 ENCOUNTER — Encounter (INDEPENDENT_AMBULATORY_CARE_PROVIDER_SITE_OTHER): Payer: Self-pay | Admitting: Family Medicine

## 2023-07-27 ENCOUNTER — Ambulatory Visit (INDEPENDENT_AMBULATORY_CARE_PROVIDER_SITE_OTHER): Payer: Medicare PPO | Admitting: Family Medicine

## 2023-07-27 VITALS — BP 184/82 | HR 67 | Temp 98.1°F | Ht 66.0 in | Wt 193.0 lb

## 2023-07-27 DIAGNOSIS — I10 Essential (primary) hypertension: Secondary | ICD-10-CM

## 2023-07-27 DIAGNOSIS — Z6831 Body mass index (BMI) 31.0-31.9, adult: Secondary | ICD-10-CM

## 2023-07-27 DIAGNOSIS — R7309 Other abnormal glucose: Secondary | ICD-10-CM | POA: Diagnosis not present

## 2023-07-27 DIAGNOSIS — E7849 Other hyperlipidemia: Secondary | ICD-10-CM | POA: Diagnosis not present

## 2023-07-27 DIAGNOSIS — E669 Obesity, unspecified: Secondary | ICD-10-CM

## 2023-07-27 MED ORDER — METFORMIN HCL 500 MG PO TABS
500.0000 mg | ORAL_TABLET | Freq: Every day | ORAL | 0 refills | Status: DC
Start: 2023-07-27 — End: 2023-10-13

## 2023-07-27 NOTE — Progress Notes (Unsigned)
Chief Complaint:   OBESITY Alicia Knapp is here to discuss her progress with her obesity treatment plan along with follow-up of her obesity related diagnoses. Alicia Knapp is on the Category 2 Plan and states she is following her eating plan approximately 85% of the time. Alicia Knapp states she is doing strengthening, cycling, and core for 20-30 minutes 1-2 times per week.  Today's visit was #: 81 Starting weight: 212 lbs Starting date: 08/03/2018 Today's weight: 193 lbs Today's date: 07/27/2023 Total lbs lost to date: 19 Total lbs lost since last in-office visit: 0  Interim History: Patient's blood pressure was high today.  She went to [facility] for a trip and really enjoyed herself.  Some things were closed.  Did not get up to the two shots a week on trulicity (needed rx this way due to lack of availability of higher dose).  Over the next few weeks she is going to her sister in laws birthday party (75th).  There is another birthday party October 3rd and that is it until Thanksgiving. She is switching back and forth on plan.  She is trying to be mindful of food choices and focusing on protein.   Subjective:   1. Elevated random blood glucose level Patient is only taking metformin daily and she is not taking Trulicity twice weekly; plan to increase to 1.5 mg to twice weekly.  2. Other hyperlipidemia Patient is on Lipitor daily, and she denies transaminitis or myalgias.  3. Essential hypertension Patient's blood pressure is elevated today.  Patient reports systolic blood pressures in the 130s at home.  She denies chest pain, chest pressure, or headache.  Her blood pressure unfortunately has been elevated consistently and she has an upcoming appointment with her PCP.  Assessment/Plan:   1. Elevated random blood glucose level We will refill metformin 500 mg once daily for 90 days.  - metFORMIN (GLUCOPHAGE) 500 MG tablet; Take 1 tablet (500 mg total) by mouth daily with breakfast.  Dispense: 90  tablet; Refill: 0  2. Other hyperlipidemia Patient will continue Lipitor with no change in treatment.  3. Essential hypertension Patient will follow-up with Dr. Laury Axon at her next appointment in early October.  Patient is to measure her blood pressure 3-4 times per week at home and bring her log to her PCP appointment.  4. BMI 31.0-31.9,adult  5. Obesity with starting BMI of 34.2 Alicia Knapp is currently in the action stage of change. As such, her goal is to continue with weight loss efforts. She has agreed to the Category 2 Plan.   Exercise goals: As is.   Behavioral modification strategies: increasing lean protein intake, meal planning and cooking strategies, keeping healthy foods in the home, and planning for success.  Alicia Knapp has agreed to follow-up with our clinic in 5 weeks. She was informed of the importance of frequent follow-up visits to maximize her success with intensive lifestyle modifications for her multiple health conditions.   Objective:   Blood pressure (!) 184/82, pulse 67, temperature 98.1 F (36.7 C), height 5\' 6"  (1.676 m), weight 193 lb (87.5 kg), SpO2 100%. Body mass index is 31.15 kg/m.  General: Cooperative, alert, well developed, in no acute distress. HEENT: Conjunctivae and lids unremarkable. Cardiovascular: Regular rhythm.  Lungs: Normal work of breathing. Neurologic: No focal deficits.   Lab Results  Component Value Date   CREATININE 0.79 01/21/2023   BUN 11 01/21/2023   NA 142 01/21/2023   K 4.9 01/21/2023   CL 104 01/21/2023  CO2 23 01/21/2023   Lab Results  Component Value Date   ALT 14 01/21/2023   AST 15 01/21/2023   ALKPHOS 83 01/21/2023   BILITOT 0.3 01/21/2023   Lab Results  Component Value Date   HGBA1C 5.5 01/21/2023   HGBA1C 5.5 07/22/2022   HGBA1C 5.5 04/01/2022   HGBA1C 5.5 10/01/2021   HGBA1C 5.3 12/25/2020   Lab Results  Component Value Date   INSULIN 16.1 01/21/2023   INSULIN 19.3 07/22/2022   INSULIN 11.2 12/25/2020    INSULIN 20.6 08/08/2020   INSULIN 10.4 08/16/2019   Lab Results  Component Value Date   TSH 1.63 10/01/2021   Lab Results  Component Value Date   CHOL 148 01/21/2023   HDL 50 01/21/2023   LDLCALC 87 01/21/2023   LDLDIRECT 140.5 08/12/2013   TRIG 54 01/21/2023   CHOLHDL 4 04/01/2022   Lab Results  Component Value Date   VD25OH 90.7 01/21/2023   VD25OH 43.2 07/22/2022   VD25OH 35.71 04/01/2022   Lab Results  Component Value Date   WBC 3.4 (L) 10/01/2021   HGB 11.9 (L) 10/01/2021   HCT 36.5 10/01/2021   MCV 86.9 10/01/2021   PLT 340.0 10/01/2021   No results found for: "IRON", "TIBC", "FERRITIN"  Attestation Statements:   Reviewed by clinician on day of visit: allergies, medications, problem list, medical history, surgical history, family history, social history, and previous encounter notes.   I, Burt Knack, am acting as transcriptionist for Reuben Likes, MD.  I have reviewed the above documentation for accuracy and completeness, and I agree with the above. - Reuben Likes, MD

## 2023-08-17 ENCOUNTER — Ambulatory Visit (INDEPENDENT_AMBULATORY_CARE_PROVIDER_SITE_OTHER): Payer: Medicare PPO | Admitting: Family Medicine

## 2023-08-17 ENCOUNTER — Encounter: Payer: Self-pay | Admitting: Family Medicine

## 2023-08-17 VITALS — BP 148/80 | HR 73 | Temp 98.7°F | Resp 18 | Ht 66.0 in | Wt 198.6 lb

## 2023-08-17 DIAGNOSIS — Z23 Encounter for immunization: Secondary | ICD-10-CM | POA: Diagnosis not present

## 2023-08-17 DIAGNOSIS — E7849 Other hyperlipidemia: Secondary | ICD-10-CM

## 2023-08-17 DIAGNOSIS — I10 Essential (primary) hypertension: Secondary | ICD-10-CM

## 2023-08-17 DIAGNOSIS — Z6831 Body mass index (BMI) 31.0-31.9, adult: Secondary | ICD-10-CM

## 2023-08-17 DIAGNOSIS — E785 Hyperlipidemia, unspecified: Secondary | ICD-10-CM

## 2023-08-17 DIAGNOSIS — E559 Vitamin D deficiency, unspecified: Secondary | ICD-10-CM | POA: Diagnosis not present

## 2023-08-17 LAB — COMPREHENSIVE METABOLIC PANEL
ALT: 17 U/L (ref 0–35)
AST: 14 U/L (ref 0–37)
Albumin: 4.4 g/dL (ref 3.5–5.2)
Alkaline Phosphatase: 81 U/L (ref 39–117)
BUN: 14 mg/dL (ref 6–23)
CO2: 28 meq/L (ref 19–32)
Calcium: 10.2 mg/dL (ref 8.4–10.5)
Chloride: 101 meq/L (ref 96–112)
Creatinine, Ser: 0.73 mg/dL (ref 0.40–1.20)
GFR: 86.16 mL/min (ref >60.00)
Glucose, Bld: 91 mg/dL (ref 70–99)
Potassium: 4 meq/L (ref 3.5–5.1)
Sodium: 138 meq/L (ref 135–145)
Total Bilirubin: 0.5 mg/dL (ref 0.2–1.2)
Total Protein: 7.7 g/dL (ref 6.0–8.3)

## 2023-08-17 LAB — LIPID PANEL
Cholesterol: 143 mg/dL (ref 0–200)
HDL: 50.1 mg/dL (ref >39.00)
LDL Cholesterol: 80 mg/dL (ref 0–99)
NonHDL: 92.55
Total CHOL/HDL Ratio: 3
Triglycerides: 65 mg/dL (ref 0.0–149.0)
VLDL: 13 mg/dL (ref 0.0–40.0)

## 2023-08-17 LAB — CBC WITH DIFFERENTIAL/PLATELET
Basophils Absolute: 0 10*3/uL (ref 0.0–0.1)
Basophils Relative: 1.1 % (ref 0.0–3.0)
Eosinophils Absolute: 0 10*3/uL (ref 0.0–0.7)
Eosinophils Relative: 1.4 % (ref 0.0–5.0)
HCT: 38.4 % (ref 36.0–46.0)
Hemoglobin: 12.2 g/dL (ref 12.0–15.0)
Lymphocytes Relative: 30.2 % (ref 12.0–46.0)
Lymphs Abs: 1.1 10*3/uL (ref 0.7–4.0)
MCHC: 31.6 g/dL (ref 30.0–36.0)
MCV: 89 fL (ref 78.0–100.0)
Monocytes Absolute: 0.3 10*3/uL (ref 0.1–1.0)
Monocytes Relative: 8.1 % (ref 3.0–12.0)
Neutro Abs: 2.1 10*3/uL (ref 1.4–7.7)
Neutrophils Relative %: 59.2 % (ref 43.0–77.0)
Platelets: 366 10*3/uL (ref 150.0–400.0)
RBC: 4.32 Mil/uL (ref 3.87–5.11)
RDW: 14 % (ref 11.5–15.5)
WBC: 3.5 10*3/uL — ABNORMAL LOW (ref 4.0–10.5)

## 2023-08-17 LAB — VITAMIN B12: Vitamin B-12: 588 pg/mL (ref 211–911)

## 2023-08-17 LAB — TSH: TSH: 1.5 u[IU]/mL (ref 0.35–5.50)

## 2023-08-17 LAB — VITAMIN D 25 HYDROXY (VIT D DEFICIENCY, FRACTURES): VITD: 31.74 ng/mL (ref 30.00–100.00)

## 2023-08-17 MED ORDER — LOSARTAN POTASSIUM-HCTZ 100-25 MG PO TABS
1.0000 | ORAL_TABLET | Freq: Every day | ORAL | 3 refills | Status: DC
Start: 2023-08-17 — End: 2024-02-15

## 2023-08-17 MED ORDER — CARVEDILOL 25 MG PO TABS: 25.0000 mg | ORAL_TABLET | Freq: Two times a day (BID) | ORAL | 3 refills | Status: DC

## 2023-08-17 MED ORDER — ATORVASTATIN CALCIUM 10 MG PO TABS
10.0000 mg | ORAL_TABLET | Freq: Every day | ORAL | 3 refills | Status: DC
Start: 2023-08-17 — End: 2024-02-15

## 2023-08-17 MED ORDER — AMLODIPINE BESYLATE 10 MG PO TABS
10.0000 mg | ORAL_TABLET | Freq: Every day | ORAL | 3 refills | Status: DC
Start: 2023-08-17 — End: 2023-10-19

## 2023-08-17 NOTE — Progress Notes (Signed)
Established Patient Office Visit  Subjective   Patient ID: Alicia Knapp, female    DOB: 08/01/1956  Age: 67 y.o. MRN: [id number]  Chief Complaint  Patient presents with   Hypertension   Hyperlipidemia   Follow-up   Discussed the use of AI scribe software for clinical note transcription with the patient, who gave verbal consent to proceed.  History of Present Illness   The patient, with a history of hypertension, presents with concerns about fluctuating blood pressure readings. She has been monitoring her blood pressure at home and has noticed readings that are sometimes high and sometimes low. The lowest recorded reading was 135/91. The patient also reports feeling fatigued, but it is unclear if this is related to her blood pressure. The patient is currently on amlodipine 10mg , carvedilol 12.5mg , and losartan 100mg  for blood pressure management. She has also recently switched from Xyzal to Zyrtec for allergy management.      HPI  Patient Active Problem List   Diagnosis Date Noted   Need for pneumococcal 20-valent conjugate vaccination 04/03/2023   Bilateral impacted cerumen 10/10/2022   Welcome to Medicare preventive visit 10/03/2022   COVID-19 03/11/2022   Prediabetes 03/06/2022   At risk for diarrhea 03/06/2022   Allergic contact dermatitis due to plants, except food 03/26/2021   Great toe pain, left 09/05/2019   Insulin resistance 11/23/2018   Vitamin D deficiency 11/10/2018   B12 nutritional deficiency 11/10/2018   Class 1 obesity with serious comorbidity and body mass index (BMI) of 30.0 to 30.9 in adult 09/02/2018   Chest pain 11/21/2015   OTHER ACUTE SINUSITIS 08/26/2010   Hyperlipidemia 07/26/2010   Seasonal allergic rhinitis 04/16/2009   Anxiety state 01/04/2008   Essential hypertension 01/04/2008   CARDIAC MURMUR 01/04/2008   Past Medical History:  Diagnosis Date   Anxiety    B12 deficiency    Constipation    Heart murmur    Hyperlipidemia    Hypertension     Joint pain    Postoperative nausea    Prediabetes    Swelling    lower ext   Vitamin D deficiency    Past Surgical History:  Procedure Laterality Date   ABDOMINAL HYSTERECTOMY     APPENDECTOMY     COLONOSCOPY  05/22/2015   Dr.Pyrtle   POLYPECTOMY     Social History   Tobacco Use   Smoking status: Former    Current packs/day: 0.00    Types: Cigarettes    Quit date: 09/17/2014    Years since quitting: 8.9   Smokeless tobacco: Never  Vaping Use   Vaping status: Never Used  Substance Use Topics   Alcohol use: No    Alcohol/week: 0.0 standard drinks of alcohol   Drug use: No   Social History   Socioeconomic History   Marital status: Married    Spouse name: Marnette Burgess   Number of children: Not on file   Years of education: Not on file   Highest education level: Not on file  Occupational History   Occupation: Stay at home spouse  Tobacco Use   Smoking status: Former    Current packs/day: 0.00    Types: Cigarettes    Quit date: 09/17/2014    Years since quitting: 8.9   Smokeless tobacco: Never  Vaping Use   Vaping status: Never Used  Substance and Sexual Activity   Alcohol use: No    Alcohol/week: 0.0 standard drinks of alcohol   Drug use: No   Sexual  activity: Yes    Partners: Male    Birth control/protection: Post-menopausal  Other Topics Concern   Not on file  Social History Narrative   Lives in house with her husband   Social Determinants of Health   Financial Resource Strain: Not on file  Food Insecurity: Not on file  Transportation Needs: Not on file  Physical Activity: Not on file  Stress: Not on file  Social Connections: Not on file  Intimate Partner Violence: Not on file   Family Status  Relation Name Status   Father  Deceased   Mother  Deceased   Sister  Alive   Brother  Alive   Brother Derek Deceased   MGM  (Not Specified)   Neg Hx  (Not Specified)  No partnership data on file   Family History  Problem Relation Age of Onset    Cancer Father        prostate   Prostate cancer Father    Diabetes Mother    Thyroid disease Mother    Hypertension Mother    Hyperlipidemia Mother    Kidney disease Mother    Cancer Mother    Sleep apnea Mother    Obesity Mother    Hypertension Sister    Hypertension Brother    Stroke Brother    Colon cancer Brother 63       died 55 - 4-5 months after dx   Arthritis Maternal Grandmother    Esophageal cancer Neg Hx    Rectal cancer Neg Hx    Stomach cancer Neg Hx    Allergies  Allergen Reactions   Erythromycin     Causes stomach cramps   Penicillins     rash      ROS    Objective:     BP (!) 148/80 (BP Location: Left Arm, Patient Position: Sitting, Cuff Size: Large)   Pulse 73   Temp 98.7 F (37.1 C) (Oral)   Resp 18   Ht 5\' 6"  (1.676 m)   Wt 198 lb 9.6 oz (90.1 kg)   SpO2 99%   BMI 32.05 kg/m  BP Readings from Last 3 Encounters:  08/17/23 (!) 148/80  07/27/23 (!) 184/82  06/22/23 (!) 150/77   Wt Readings from Last 3 Encounters:  08/17/23 198 lb 9.6 oz (90.1 kg)  07/27/23 193 lb (87.5 kg)  06/22/23 193 lb (87.5 kg)   SpO2 Readings from Last 3 Encounters:  08/17/23 99%  07/27/23 100%  06/22/23 100%      Physical Exam   No results found for any visits on 08/17/23.  Last CBC Lab Results  Component Value Date   WBC 3.4 (L) 10/01/2021   HGB 11.9 (L) 10/01/2021   HCT 36.5 10/01/2021   MCV 86.9 10/01/2021   RDW 13.5 10/01/2021   PLT 340.0 10/01/2021   Last metabolic panel Lab Results  Component Value Date   GLUCOSE 83 01/21/2023   NA 142 01/21/2023   K 4.9 01/21/2023   CL 104 01/21/2023   CO2 23 01/21/2023   BUN 11 01/21/2023   CREATININE 0.79 01/21/2023   EGFR 83 01/21/2023   CALCIUM 10.1 01/21/2023   PROT 7.7 01/21/2023   ALBUMIN 4.5 01/21/2023   LABGLOB 3.2 01/21/2023   AGRATIO 1.4 01/21/2023   BILITOT 0.3 01/21/2023   ALKPHOS 83 01/21/2023   AST 15 01/21/2023   ALT 14 01/21/2023   Last lipids Lab Results  Component  Value Date   CHOL 148 01/21/2023   HDL 50 01/21/2023  LDLCALC 87 01/21/2023   LDLDIRECT 140.5 08/12/2013   TRIG 54 01/21/2023   CHOLHDL 4 04/01/2022   Last hemoglobin A1c Lab Results  Component Value Date   HGBA1C 5.5 01/21/2023   Last thyroid functions Lab Results  Component Value Date   TSH 1.63 10/01/2021   T3TOTAL 105 04/08/2019   Last vitamin D Lab Results  Component Value Date   VD25OH 90.7 01/21/2023   Last vitamin B12 and Folate Lab Results  Component Value Date   VITAMINB12 471 04/08/2019   FOLATE 7.6 04/08/2019      The 10-year ASCVD risk score (Arnett DK, et al., 2019) is: 10%    Assessment & Plan:   Problem List Items Addressed This Visit       Unprioritized   Vitamin D deficiency   Relevant Orders   VITAMIN D 25 Hydroxy (Vit-D Deficiency, Fractures)   Hyperlipidemia    Encourage heart healthy diet such as MIND or DASH diet, increase exercise, avoid trans fats, simple carbohydrates and processed foods, consider a krill or fish or flaxseed oil cap daily.        Relevant Medications   carvedilol (COREG) 25 MG tablet   losartan-hydrochlorothiazide (HYZAAR) 100-25 MG tablet   amLODipine (NORVASC) 10 MG tablet   atorvastatin (LIPITOR) 10 MG tablet   Other Relevant Orders   Comprehensive metabolic panel   Lipid panel   Essential hypertension - Primary    Well controlled, no changes to meds. Encouraged heart healthy diet such as the DASH diet and exercise as tolerated.        Relevant Medications   carvedilol (COREG) 25 MG tablet   losartan-hydrochlorothiazide (HYZAAR) 100-25 MG tablet   amLODipine (NORVASC) 10 MG tablet   atorvastatin (LIPITOR) 10 MG tablet   Other Relevant Orders   Lipid panel   TSH   Insulin, random   Other Visit Diagnoses     Need for influenza vaccination       Relevant Orders   Flu Vaccine Trivalent High Dose (Fluad) (Completed)   BMI 31.0-31.9,adult       Relevant Orders   CBC with Differential/Platelet    Comprehensive metabolic panel   Lipid panel   TSH   Insulin, random   Vitamin B12     Assessment and Plan    Hypertension Blood pressure readings remain elevated despite current regimen of Amlodipine 10mg , Carvedilol 12.5mg , and Losartan 100mg . Patient reports feeling tired, which may be related to hypertension. -Increase Carvedilol to 25mg  twice daily. -Combine Losartan and HCTZ into one pill for ease of administration. -Check blood work including thyroid and B12 levels. -Continue monitoring blood pressure at home.  General Health Maintenance -Administer influenza vaccine today. -Schedule COVID-19 vaccine for Saturday. -Follow-up in six months.        No follow-ups on file.    Donato Schultz, DO

## 2023-08-17 NOTE — Assessment & Plan Note (Signed)
Well controlled, no changes to meds. Encouraged heart healthy diet such as the DASH diet and exercise as tolerated.  °

## 2023-08-17 NOTE — Assessment & Plan Note (Signed)
Encourage heart healthy diet such as MIND or DASH diet, increase exercise, avoid trans fats, simple carbohydrates and processed foods, consider a krill or fish or flaxseed oil cap daily.  °

## 2023-08-18 LAB — INSULIN, RANDOM: Insulin: 21.8 u[IU]/mL — ABNORMAL HIGH

## 2023-08-24 ENCOUNTER — Encounter (INDEPENDENT_AMBULATORY_CARE_PROVIDER_SITE_OTHER): Payer: Self-pay | Admitting: Family Medicine

## 2023-08-24 ENCOUNTER — Other Ambulatory Visit (INDEPENDENT_AMBULATORY_CARE_PROVIDER_SITE_OTHER): Payer: Self-pay | Admitting: Family Medicine

## 2023-08-24 ENCOUNTER — Ambulatory Visit (INDEPENDENT_AMBULATORY_CARE_PROVIDER_SITE_OTHER): Payer: Medicare PPO | Admitting: Family Medicine

## 2023-08-24 VITALS — BP 162/92 | HR 81 | Temp 98.6°F | Ht 66.0 in | Wt 195.0 lb

## 2023-08-24 DIAGNOSIS — E669 Obesity, unspecified: Secondary | ICD-10-CM

## 2023-08-24 DIAGNOSIS — E559 Vitamin D deficiency, unspecified: Secondary | ICD-10-CM | POA: Diagnosis not present

## 2023-08-24 DIAGNOSIS — Z6834 Body mass index (BMI) 34.0-34.9, adult: Secondary | ICD-10-CM

## 2023-08-24 DIAGNOSIS — Z6831 Body mass index (BMI) 31.0-31.9, adult: Secondary | ICD-10-CM | POA: Diagnosis not present

## 2023-08-24 DIAGNOSIS — I10 Essential (primary) hypertension: Secondary | ICD-10-CM | POA: Diagnosis not present

## 2023-08-24 DIAGNOSIS — R739 Hyperglycemia, unspecified: Secondary | ICD-10-CM

## 2023-08-24 MED ORDER — VITAMIN D (ERGOCALCIFEROL) 1.25 MG (50000 UNIT) PO CAPS
50000.0000 [IU] | ORAL_CAPSULE | ORAL | 0 refills | Status: DC
Start: 2023-08-24 — End: 2023-11-18

## 2023-08-24 MED ORDER — TRULICITY 3 MG/0.5ML ~~LOC~~ SOAJ: 3.0000 mg | SUBCUTANEOUS | 0 refills | Status: DC

## 2023-08-24 NOTE — Progress Notes (Signed)
Chief Complaint:   OBESITY Alicia Knapp is here to discuss her progress with her obesity treatment plan along with follow-up of her obesity related diagnoses. Alicia Knapp is on the Category 2 Plan and states she is following her eating plan approximately 60% of the time. Alicia Knapp states she is doing core exercise for 20 minutes 1 time per week, strengthening for 30 minutes 2 times per week, and cycling for 30 minutes 2 times per week.  Today's visit was #: 82 Starting weight: 212 lbs Starting date: 08/03/2018 Today's weight: 195 lbs Today's date: 08/24/2023 Total lbs lost to date: 17 Total lbs lost since last in-office visit: 0  Interim History: Since last appointment she has seen her PCP.  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  She got labs done. She has been consistent with her physical activity over the last few weeks.  Her niece has been at the house and she is anticipating her leaving today.   Subjective:   1. Vitamin D deficiency Patient is on vitamin D in a multivitamin.  Her last vitamin D level was of 31.74, and she notes fatigue.  2. Elevated random blood glucose level Patient is on Trulicity weekly.  Her last A1c was within normal limits, but her insulin was elevated.  3. Essential hypertension Patient's blood pressure is controlled today.  She is on Norvasc, Coreg, and Hyzaar.  She had a recent increase in Coreg by her PCP.  Assessment/Plan:   1. Vitamin D deficiency Patient agreed to restart prescription vitamin D 50,000 IU once weekly with a 90-day supply.  - Vitamin D, Ergocalciferol, (DRISDOL) 1.25 MG (50000 UNIT) CAPS capsule; Take 1 capsule (50,000 Units total) by mouth every 7 (seven) days.  Dispense: 12 capsule; Refill: 0  2. Elevated random blood glucose level We will refill Trulicity 3 mg subcu weekly for 90 days.  - Dulaglutide (TRULICITY) 3 MG/0.5ML SOPN; Inject 3 mg as directed  once a week.  Dispense: 6 mL; Refill: 0  3. Essential hypertension We will follow-up on patient's blood pressure at her next appointment.  4. BMI 31.0-31.9,adult  5. Obesity with starting BMI of 34.2 Alicia Knapp is currently in the action stage of change. As such, her goal is to continue with weight loss efforts. She has agreed to the Category 2 Plan.   Exercise goals: As is.   Behavioral modification strategies: increasing lean protein intake, meal planning and cooking strategies, keeping healthy foods in the home, and planning for success.  Alicia Knapp has agreed to follow-up with our clinic in 3 to 5 weeks. She was informed of the importance of frequent follow-up visits to maximize her success with intensive lifestyle modifications for her multiple health conditions.   Objective:   Blood pressure (!) 162/92, pulse 81, temperature 98.6 F (37 C), height 5\' 6"  (1.676 m), weight 195 lb (88.5 kg), SpO2 97%. Body mass index is 31.47 kg/m.  General: Cooperative, alert, well developed, in no acute distress. HEENT: Conjunctivae and lids unremarkable. Cardiovascular: Regular rhythm.  Lungs: Normal work of breathing. Neurologic: No focal deficits.   Lab Results  Component Value Date   CREATININE 0.73 08/17/2023   BUN 14 08/17/2023   NA 138 08/17/2023   K 4.0 08/17/2023   CL 101 08/17/2023   CO2 28 08/17/2023   Lab Results  Component Value Date   ALT 17 08/17/2023   AST 14 08/17/2023   ALKPHOS  81 08/17/2023   BILITOT 0.5 08/17/2023   Lab Results  Component Value Date   HGBA1C 5.5 01/21/2023   HGBA1C 5.5 07/22/2022   HGBA1C 5.5 04/01/2022   HGBA1C 5.5 10/01/2021   HGBA1C 5.3 12/25/2020   Lab Results  Component Value Date   INSULIN 16.1 01/21/2023   INSULIN 19.3 07/22/2022   INSULIN 11.2 12/25/2020   INSULIN 20.6 08/08/2020   INSULIN 10.4 08/16/2019   Lab Results  Component Value Date   TSH 1.50 08/17/2023   Lab Results  Component Value Date   CHOL 143 08/17/2023   HDL  50.10 08/17/2023   LDLCALC 80 08/17/2023   LDLDIRECT 140.5 08/12/2013   TRIG 65.0 08/17/2023   CHOLHDL 3 08/17/2023   Lab Results  Component Value Date   VD25OH 31.74 08/17/2023   VD25OH 90.7 01/21/2023   VD25OH 43.2 07/22/2022   Lab Results  Component Value Date   WBC 3.5 (L) 08/17/2023   HGB 12.2 08/17/2023   HCT 38.4 08/17/2023   MCV 89.0 08/17/2023   PLT 366.0 08/17/2023   No results found for: "IRON", "TIBC", "FERRITIN"  Attestation Statements:   Reviewed by clinician on day of visit: allergies, medications, problem list, medical history, surgical history, family history, social history, and previous encounter notes.   I, Burt Knack, am acting as transcriptionist for Reuben Likes, MD.  I have reviewed the above documentation for accuracy and completeness, and I agree with the above. - Reuben Likes, MD

## 2023-08-25 ENCOUNTER — Encounter (INDEPENDENT_AMBULATORY_CARE_PROVIDER_SITE_OTHER): Payer: Self-pay | Admitting: Family Medicine

## 2023-08-26 ENCOUNTER — Other Ambulatory Visit (HOSPITAL_COMMUNITY): Payer: Self-pay

## 2023-08-26 ENCOUNTER — Other Ambulatory Visit (INDEPENDENT_AMBULATORY_CARE_PROVIDER_SITE_OTHER): Payer: Self-pay

## 2023-08-26 DIAGNOSIS — R739 Hyperglycemia, unspecified: Secondary | ICD-10-CM

## 2023-08-26 MED ORDER — TRULICITY 3 MG/0.5ML ~~LOC~~ SOAJ
3.0000 mg | SUBCUTANEOUS | 0 refills | Status: DC
  Filled 2023-08-26: qty 6, 84d supply, fill #0

## 2023-09-14 ENCOUNTER — Encounter (INDEPENDENT_AMBULATORY_CARE_PROVIDER_SITE_OTHER): Payer: Self-pay | Admitting: Family Medicine

## 2023-09-14 ENCOUNTER — Ambulatory Visit (INDEPENDENT_AMBULATORY_CARE_PROVIDER_SITE_OTHER): Payer: Medicare PPO | Admitting: Family Medicine

## 2023-09-14 VITALS — BP 150/77 | HR 63 | Temp 98.0°F | Ht 66.0 in | Wt 191.0 lb

## 2023-09-14 DIAGNOSIS — I10 Essential (primary) hypertension: Secondary | ICD-10-CM

## 2023-09-14 DIAGNOSIS — K5900 Constipation, unspecified: Secondary | ICD-10-CM | POA: Insufficient documentation

## 2023-09-14 DIAGNOSIS — R739 Hyperglycemia, unspecified: Secondary | ICD-10-CM | POA: Diagnosis not present

## 2023-09-14 DIAGNOSIS — Z6830 Body mass index (BMI) 30.0-30.9, adult: Secondary | ICD-10-CM

## 2023-09-14 DIAGNOSIS — K5909 Other constipation: Secondary | ICD-10-CM

## 2023-09-14 DIAGNOSIS — E66811 Obesity, class 1: Secondary | ICD-10-CM

## 2023-09-14 NOTE — Assessment & Plan Note (Signed)
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  She is noticing some increase in constipation.  She does feel much more control with her food choices and snacking with higher dose.

## 2023-09-14 NOTE — Assessment & Plan Note (Signed)
Patient blood pressure much better controlled at home over the last month.  She was able to be much more in control of food intake quantity and choice over the last month.  No chest pain, chest pressure, or headache.  Will not make changes to blood pressure medication but will continue to follow her blood pressure at home to ensure blood pressure has been controlled outside of the clinic.

## 2023-09-14 NOTE — Assessment & Plan Note (Signed)
Constipation worsened with Trulicity but patient has struggled with this for her whole life.  She has taken miralax in the past but only taking it once a week.  She as encouraged to take miralax daily to help with constipation from trulicity and increased protein intake.

## 2023-09-14 NOTE — Progress Notes (Signed)
SUBJECTIVE:  Chief Complaint: Obesity  Interim History: Patient has been mindful of food choices.  She has been eating more on plan and her blood pressure has been much better controlled since her food intake has been more controlled. She realizes she needs to really follow the meal plan 90-95% of the time in order to see change on the scale.  She was able to eat on plan even when eating out.  For Thanksgiving she is going to her nephew's house and is planning to be mindful of portions and be mindful of carbs.  Alicia Knapp is here to discuss her progress with her obesity treatment plan. She is on the Category 2 Plan and states she is following her eating plan approximately 90 % of the time. She states she is exercising 30 minutes 3 times per week.   OBJECTIVE: Visit Diagnoses: Problem List Items Addressed This Visit       Cardiovascular and Mediastinum   Essential hypertension    Patient blood pressure much better controlled at home over the last month.  She was able to be much more in control of food intake quantity and choice over the last month.  No chest pain, chest pressure, or headache.  Will not make changes to blood pressure medication but will continue to follow her blood pressure at home to ensure blood pressure has been controlled outside of the clinic.        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  She is noticing some increase in constipation.  She does feel much more control with her food choices and snacking with higher dose.      Constipation    Constipation worsened with Trulicity but patient has struggled with this for her whole life.  She has taken miralax in the past but only taking it once a week.  She as encouraged to take miralax daily to help with constipation from trulicity and increased protein intake.       Vitals Temp: 98 F (36.7 C) BP: (!) 150/77 Pulse Rate: 63 SpO2: 100  %   Anthropometric Measurements Height: 5\' 6"  (1.676 m) Weight: 191 lb (86.6 kg) BMI (Calculated): 30.84 Weight at Last Visit: 195 lb Weight Lost Since Last Visit: 4 Weight Gained Since Last Visit: 0 Starting Weight: 212 lb Total Weight Loss (lbs): 21 lb (9.526 kg)   Body Composition  Body Fat %: 42.1 % Fat Mass (lbs): 80.6 lbs Muscle Mass (lbs): 105.2 lbs Total Body Water (lbs): 69.2 lbs Visceral Fat Rating : 11   Other Clinical Data Today's Visit #: 97 Starting Date: 08/03/18     ASSESSMENT AND PLAN:  Diet: Alicia Knapp is currently in the action stage of change. As such, her goal is to continue with weight loss efforts. She has agreed to Category 2 Plan.  Exercise: Alicia Knapp has been instructed to continue exercising as is for weight loss and overall health benefits.   Behavior Modification:  We discussed the following Behavioral Modification Strategies today: increasing lean protein intake, increasing vegetables, meal planning and cooking strategies, and holiday eating strategies.   No follow-ups on file.[name] She was informed of the importance of frequent follow up visits to maximize her success with intensive lifestyle modifications for her multiple health conditions.  Attestation Statements:   Reviewed by clinician on day of visit: allergies, medications, problem list, medical history, surgical history, family history,  social history, and previous encounter notes.    Reuben Likes, MD

## 2023-10-05 ENCOUNTER — Other Ambulatory Visit: Payer: Self-pay | Admitting: Family Medicine

## 2023-10-05 DIAGNOSIS — Z1231 Encounter for screening mammogram for malignant neoplasm of breast: Secondary | ICD-10-CM

## 2023-10-08 DIAGNOSIS — F3174 Bipolar disorder, in full remission, most recent episode manic: Secondary | ICD-10-CM | POA: Diagnosis not present

## 2023-10-13 ENCOUNTER — Ambulatory Visit (INDEPENDENT_AMBULATORY_CARE_PROVIDER_SITE_OTHER): Payer: Medicare PPO | Admitting: Family Medicine

## 2023-10-13 ENCOUNTER — Encounter (INDEPENDENT_AMBULATORY_CARE_PROVIDER_SITE_OTHER): Payer: Self-pay | Admitting: Family Medicine

## 2023-10-13 VITALS — BP 129/74 | HR 68 | Temp 98.5°F | Ht 66.0 in | Wt 194.0 lb

## 2023-10-13 DIAGNOSIS — Z6834 Body mass index (BMI) 34.0-34.9, adult: Secondary | ICD-10-CM

## 2023-10-13 DIAGNOSIS — R739 Hyperglycemia, unspecified: Secondary | ICD-10-CM

## 2023-10-13 DIAGNOSIS — Z6831 Body mass index (BMI) 31.0-31.9, adult: Secondary | ICD-10-CM

## 2023-10-13 DIAGNOSIS — I10 Essential (primary) hypertension: Secondary | ICD-10-CM | POA: Diagnosis not present

## 2023-10-13 DIAGNOSIS — E669 Obesity, unspecified: Secondary | ICD-10-CM | POA: Diagnosis not present

## 2023-10-13 MED ORDER — METFORMIN HCL 500 MG PO TABS: 500.0000 mg | ORAL_TABLET | Freq: Every day | ORAL | 0 refills | Status: DC

## 2023-10-13 NOTE — Assessment & Plan Note (Signed)
Initial BP elevated today but second reading WNL.  She is on norvasc 10mg  and hyzaar 100-25 daily.  She denies chest pain, chest pressure, headache.  Continue current meds at same does- no changes and no refills needed.

## 2023-10-13 NOTE — Assessment & Plan Note (Signed)
Patient on Trulicity and Metformin.  No GI side effects on her medications.  Now on Trulicity 3mg  weekly.  Needs refill of Metformin today.

## 2023-10-13 NOTE — Progress Notes (Signed)
   SUBJECTIVE:  Chief Complaint: Obesity  Interim History: Patient voices that the last few weeks she has maybe eaten more than she anticipated.  She mentions she may have over eaten the carbohydrates over the Thanksgiving holiday.  Her girlfriend has a birthday dinner on Dec 14 otherwise she doesn't have much planned for this month. No plans for New Years either.  Biggest obstacle coming up is carbohydrates.  Francys is here to discuss her progress with her obesity treatment plan. She is on the Category 2 Plan and states she is following her eating plan approximately 85 % of the time. She states she is exercising 20-30 minutes 3 times per week.   OBJECTIVE: Visit Diagnoses: Problem List Items Addressed This Visit       Cardiovascular and Mediastinum   Essential hypertension    Initial BP elevated today but second reading WNL.  She is on norvasc 10mg  and hyzaar 100-25 daily.  She denies chest pain, chest pressure, headache.  Continue current meds at same does- no changes and no refills needed.        Other   Elevated random blood glucose level - Primary    Patient on Trulicity and Metformin.  No GI side effects on her medications.  Now on Trulicity 3mg  weekly.  Needs refill of Metformin today.       Relevant Medications   metFORMIN (GLUCOPHAGE) 500 MG tablet   Other Visit Diagnoses     Obesity with starting BMI of 34.2       Relevant Medications   metFORMIN (GLUCOPHAGE) 500 MG tablet   BMI 31.0-31.9,adult           Vitals Temp: 98.5 F (36.9 C) BP: 129/74 Pulse Rate: 68 SpO2: 100 %   Anthropometric Measurements Height: 5\' 6"  (1.676 m) Weight: 194 lb (88 kg) BMI (Calculated): 31.33 Weight at Last Visit: 191 lb Weight Lost Since Last Visit: 0 Weight Gained Since Last Visit: 3 Starting Weight: 212 lb Total Weight Loss (lbs): 18 lb (8.165 kg)   Body Composition  Body Fat %: 42.2 % Fat Mass (lbs): 81.8 lbs Muscle Mass (lbs): 106.6 lbs Total Body Water (lbs): 70.4  lbs Visceral Fat Rating : 12   Other Clinical Data Today's Visit #: 13 Starting Date: 08/03/18     ASSESSMENT AND PLAN:  Diet: Precilla is currently in the action stage of change. As such, her goal is to continue with weight loss efforts. She has agreed to Category 2 Plan.  Exercise: Jolena has been instructed that some exercise is better than none for weight loss and overall health benefits.   Behavior Modification:  We discussed the following Behavioral Modification Strategies today: increasing lean protein intake, increasing vegetables, meal planning and cooking strategies, better snacking choices, holiday eating strategies, and planning for success.   No follow-ups on file.[name] She was informed of the importance of frequent follow up visits to maximize her success with intensive lifestyle modifications for her multiple health conditions.  Attestation Statements:   Reviewed by clinician on day of visit: allergies, medications, problem list, medical history, surgical history, family history, social history, and previous encounter notes.      Reuben Likes, MD

## 2023-10-19 ENCOUNTER — Other Ambulatory Visit: Payer: Self-pay | Admitting: Family Medicine

## 2023-10-19 DIAGNOSIS — I10 Essential (primary) hypertension: Secondary | ICD-10-CM

## 2023-10-31 ENCOUNTER — Other Ambulatory Visit (INDEPENDENT_AMBULATORY_CARE_PROVIDER_SITE_OTHER): Payer: Self-pay | Admitting: Family Medicine

## 2023-10-31 DIAGNOSIS — R739 Hyperglycemia, unspecified: Secondary | ICD-10-CM

## 2023-10-31 DIAGNOSIS — E559 Vitamin D deficiency, unspecified: Secondary | ICD-10-CM

## 2023-11-18 ENCOUNTER — Encounter (INDEPENDENT_AMBULATORY_CARE_PROVIDER_SITE_OTHER): Payer: Self-pay | Admitting: Family Medicine

## 2023-11-18 ENCOUNTER — Ambulatory Visit (INDEPENDENT_AMBULATORY_CARE_PROVIDER_SITE_OTHER): Payer: Medicare PPO | Admitting: Family Medicine

## 2023-11-18 VITALS — BP 153/73 | HR 71 | Temp 98.6°F | Ht 66.0 in | Wt 196.0 lb

## 2023-11-18 DIAGNOSIS — R739 Hyperglycemia, unspecified: Secondary | ICD-10-CM | POA: Diagnosis not present

## 2023-11-18 DIAGNOSIS — E66811 Obesity, class 1: Secondary | ICD-10-CM | POA: Diagnosis not present

## 2023-11-18 DIAGNOSIS — Z6834 Body mass index (BMI) 34.0-34.9, adult: Secondary | ICD-10-CM

## 2023-11-18 DIAGNOSIS — Z6831 Body mass index (BMI) 31.0-31.9, adult: Secondary | ICD-10-CM

## 2023-11-18 DIAGNOSIS — E559 Vitamin D deficiency, unspecified: Secondary | ICD-10-CM

## 2023-11-18 DIAGNOSIS — I10 Essential (primary) hypertension: Secondary | ICD-10-CM | POA: Diagnosis not present

## 2023-11-18 MED ORDER — TRULICITY 3 MG/0.5ML ~~LOC~~ SOAJ: 3.0000 mg | SUBCUTANEOUS | 0 refills | Status: DC

## 2023-11-18 MED ORDER — METFORMIN HCL 500 MG PO TABS: 500.0000 mg | ORAL_TABLET | Freq: Every day | ORAL | 0 refills | Status: DC

## 2023-11-18 MED ORDER — VITAMIN D (ERGOCALCIFEROL) 1.25 MG (50000 UNIT) PO CAPS: 50000.0000 [IU] | ORAL_CAPSULE | ORAL | 0 refills | Status: DC

## 2023-11-18 NOTE — Assessment & Plan Note (Signed)
 On Rx Vitamin D .  Last level done in October.  Patient needs a refill today.

## 2023-11-18 NOTE — Assessment & Plan Note (Signed)
 Blood pressure elevated today.  No chest pain, chest pressure or headache.  She is on norvasc , coreg  and hyzaar.  Patient agreed to measure BP at home at different times during the day 2-3 times a week and bring log to next appointment.

## 2023-11-18 NOTE — Progress Notes (Signed)
SUBJECTIVE:  Chief Complaint: Obesity  Interim History: Patient stayed home for Christmas but went to a few gatherings.  She stayed home for New Years.  She realizes she needs to follow the plan at least 90% of the time.  Her birthday is coming up for February.  She needs to focus to be more consistent.  She is going to [employer] for happy hour.  Valentines day she plans to stay in because it is so busy.   Alicia Knapp is here to discuss her progress with her obesity treatment plan. She is on the Category 2 Plan and states she is following her eating plan approximately 85 % of the time. She states she is exercising 20-30 minutes 3 times per week.   OBJECTIVE: Visit Diagnoses: Problem List Items Addressed This Visit       Cardiovascular and Mediastinum   Essential hypertension   Blood pressure elevated today.  No chest pain, chest pressure or headache.  She is on norvasc, coreg and hyzaar.  Patient agreed to measure BP at home at different times during the day 2-3 times a week and bring log to next appointment.        Other   Vitamin D deficiency   On Rx Vitamin D.  Last level done in October.  Patient needs a refill today.      Relevant Medications   Vitamin D, Ergocalciferol, (DRISDOL) 1.25 MG (50000 UNIT) CAPS capsule   Elevated random blood glucose level - Primary   Patient doing well on trulicity and metformin.  She needs refills today.  Last labs done showing A1c of 5.5.  Insulin level was done in October and was elevated at 21.  Repeat labs at next appointment.      Relevant Medications   Dulaglutide (TRULICITY) 3 MG/0.5ML SOAJ   metFORMIN (GLUCOPHAGE) 500 MG tablet   Class 1 obesity with serious comorbidity and body mass index (BMI) of 34.0 to 34.9 in adult   Patient has been working on dietary and physical activity changes to maintain the weight loss she has experienced.  She is currently on GL P1 therapy in the form of Trulicity.  This is helping control her carbohydrate  intake and her cravings.  She voices that she feels good at current dose but realizes she was over indulging over the holidays.  She wants to get back to her prechosen meal plan as she voices she knows when she eats that way she not only feels better but she is adequately getting her nutrition and the scale changes.      Relevant Medications   Dulaglutide (TRULICITY) 3 MG/0.5ML SOAJ   metFORMIN (GLUCOPHAGE) 500 MG tablet   Other Visit Diagnoses       BMI 31.0-31.9,adult           No data recorded  No data recorded  No data recorded  No data recorded    ASSESSMENT AND PLAN:  Diet: Alicia Knapp is currently in the action stage of change. As such, her goal is to continue with weight loss efforts. She has agreed to Category 2 Plan.  Exercise: Alicia Knapp has been instructed that some exercise is better than none for weight loss and overall health benefits.   Behavior Modification:  We discussed the following Behavioral Modification Strategies today: increasing lean protein intake, increasing vegetables, no skipping meals, better snacking choices, holiday eating strategies, and planning for success.  Return in about 4 weeks (around 12/16/2023) for fasting labs and IC.[name] She was  informed of the importance of frequent follow up visits to maximize her success with intensive lifestyle modifications for her multiple health conditions.  Attestation Statements:   Reviewed by clinician on day of visit: allergies, medications, problem list, medical history, surgical history, family history, social history, and previous encounter notes.      Reuben Likes, MD

## 2023-11-18 NOTE — Assessment & Plan Note (Signed)
 Patient doing well on trulicity  and metformin .  She needs refills today.  Last labs done showing A1c of 5.5.  Insulin  level was done in October and was elevated at 21.  Repeat labs at next appointment.

## 2023-11-25 DIAGNOSIS — Z6834 Body mass index (BMI) 34.0-34.9, adult: Secondary | ICD-10-CM | POA: Insufficient documentation

## 2023-11-25 NOTE — Assessment & Plan Note (Signed)
Patient has been working on dietary and physical activity changes to maintain the weight loss she has experienced.  She is currently on GL P1 therapy in the form of Trulicity.  This is helping control her carbohydrate intake and her cravings.  She voices that she feels good at current dose but realizes she was over indulging over the holidays.  She wants to get back to her prechosen meal plan as she voices she knows when she eats that way she not only feels better but she is adequately getting her nutrition and the scale changes.

## 2023-12-17 ENCOUNTER — Ambulatory Visit (INDEPENDENT_AMBULATORY_CARE_PROVIDER_SITE_OTHER): Payer: Medicare PPO | Admitting: Family Medicine

## 2023-12-17 ENCOUNTER — Encounter (INDEPENDENT_AMBULATORY_CARE_PROVIDER_SITE_OTHER): Payer: Self-pay | Admitting: Family Medicine

## 2023-12-17 VITALS — BP 146/78 | HR 68 | Temp 98.3°F | Ht 66.0 in | Wt 197.0 lb

## 2023-12-17 DIAGNOSIS — E559 Vitamin D deficiency, unspecified: Secondary | ICD-10-CM

## 2023-12-17 DIAGNOSIS — R0602 Shortness of breath: Secondary | ICD-10-CM

## 2023-12-17 DIAGNOSIS — Z6831 Body mass index (BMI) 31.0-31.9, adult: Secondary | ICD-10-CM

## 2023-12-17 DIAGNOSIS — E7849 Other hyperlipidemia: Secondary | ICD-10-CM

## 2023-12-17 DIAGNOSIS — E669 Obesity, unspecified: Secondary | ICD-10-CM

## 2023-12-17 DIAGNOSIS — E785 Hyperlipidemia, unspecified: Secondary | ICD-10-CM | POA: Diagnosis not present

## 2023-12-17 DIAGNOSIS — I10 Essential (primary) hypertension: Secondary | ICD-10-CM | POA: Diagnosis not present

## 2023-12-17 DIAGNOSIS — R739 Hyperglycemia, unspecified: Secondary | ICD-10-CM | POA: Diagnosis not present

## 2023-12-17 DIAGNOSIS — E66811 Obesity, class 1: Secondary | ICD-10-CM

## 2023-12-17 DIAGNOSIS — Z6834 Body mass index (BMI) 34.0-34.9, adult: Secondary | ICD-10-CM

## 2023-12-17 NOTE — Progress Notes (Signed)
 SUBJECTIVE:  Chief Complaint: Obesity  Interim History: Patient's birthday is in a few days and she is planning on going to Happy Hour at [employer].  For Valentine's Day she is going to [employer] for a fundraiser.  She had a few engagements that she ate indulgently at.  For the most part she has been eating on plan.  She started weighing protein again and is getting between 6-7oz at supper.  Getting in her 200 calories for snack and sometimes more.  She is opting more for fruit for snacks.   Alicia Knapp is here to discuss her progress with her obesity treatment plan. She is on the Category 2 Plan and states she is following her eating plan approximately 85 % of the time. She states she is exercising 15-30 minutes 3 times per week.   OBJECTIVE: Visit Diagnoses: Problem List Items Addressed This Visit       Cardiovascular and Mediastinum   Essential hypertension   Blood pressure slightly elevated today. She brought a blood pressure log today- at home her blood pressures have been 120s systolically and 80s diastolically.  No change in medications or dosages at this time. Will check electrolytes today.        Other   Hyperlipidemia   Last lipid panel from October 2024 showing a total cholesterol of 143 with an HDL of 50 triglycerides of 65 and an LDL of 80.  We will repeat fasting lipid panel today with him to discuss labs at next appointment      Relevant Orders   Lipid Panel With LDL/HDL Ratio (Completed)   Vitamin D deficiency   Patient taking Rx Vitamin D and last level in October in the 30s.  Vitamin D level today.      Relevant Orders   VITAMIN D 25 Hydroxy (Vit-D Deficiency, Fractures) (Completed)   Elevated random blood glucose level   Elevated blood sugars previously.  She is taking trulicity weekly with better control of food intake particularly carbohydrates.  Will need repeat CMP, A1c and Insulin today.  No refill of trulicity needed.      Relevant Orders   Comprehensive  metabolic panel (Completed)   Hemoglobin A1c (Completed)   Insulin, random (Completed)   SOBOE (shortness of breath on exertion) - Primary   RMR on 06/19/22 of 1613.  She is not experiencing much in terms of symptom improvement.  Repeat indirect calorimetry done today showing RMR of 1670.  She will increase intake to Category 3 with 8oz at supper.         12/17/2023    7:00 AM 11/18/2023   11:38 AM 11/18/2023   11:00 AM  Vitals with BMI  Height 5\' 6"   5\' 6"   Weight 197 lbs  196 lbs  BMI 31.81  31.65  Systolic 146 153 409  Diastolic 78 73 77  Pulse 68  71    No data recorded  No data recorded  No data recorded  No data recorded    ASSESSMENT AND PLAN:  Diet: Alicia Knapp is currently in the action stage of change. As such, her goal is to continue with weight loss efforts. She has agreed to Category 3 Plan with 8oz at supper  Exercise: Alicia Knapp has been instructed to work up to a goal of 150 minutes of combined cardio and strengthening exercise per week for weight loss and overall health benefits.   Behavior Modification:  We discussed the following Behavioral Modification Strategies today: increasing lean protein intake, increasing vegetables,  meal planning and cooking strategies, ways to avoid boredom eating, ways to avoid nighttime snacking, and better snacking choices.   Return in about 4 weeks (around 01/14/2024).[name] She was informed of the importance of frequent follow up visits to maximize her success with intensive lifestyle modifications for her multiple health conditions.  Attestation Statements:   Reviewed by clinician on day of visit: allergies, medications, problem list, medical history, surgical history, family history, social history, and previous encounter notes.   Reuben Likes, MD

## 2023-12-17 NOTE — Assessment & Plan Note (Signed)
Blood pressure slightly elevated today. She brought a blood pressure log today- at home her blood pressures have been 120s systolically and 80s diastolically.  No change in medications or dosages at this time. Will check electrolytes today.

## 2023-12-17 NOTE — Assessment & Plan Note (Signed)
RMR on 06/19/22 of 1613.  She is not experiencing much in terms of symptom improvement.  Repeat indirect calorimetry done today showing RMR of 1670.  She will increase intake to Category 3 with 8oz at supper.

## 2023-12-22 ENCOUNTER — Ambulatory Visit: Payer: Medicare PPO

## 2023-12-22 LAB — COMPREHENSIVE METABOLIC PANEL
ALT: 16 [IU]/L (ref 0–32)
AST: 20 [IU]/L (ref 0–40)
Albumin: 4.3 g/dL (ref 3.9–4.9)
Alkaline Phosphatase: 79 [IU]/L (ref 44–121)
BUN/Creatinine Ratio: 15 (ref 12–28)
BUN: 13 mg/dL (ref 8–27)
Bilirubin Total: 0.3 mg/dL (ref 0.0–1.2)
CO2: 25 mmol/L (ref 20–29)
Calcium: 10.6 mg/dL — ABNORMAL HIGH (ref 8.7–10.3)
Chloride: 102 mmol/L (ref 96–106)
Creatinine, Ser: 0.87 mg/dL (ref 0.57–1.00)
Globulin, Total: 3 g/dL (ref 1.5–4.5)
Glucose: 83 mg/dL (ref 70–99)
Potassium: 4.8 mmol/L (ref 3.5–5.2)
Sodium: 140 mmol/L (ref 134–144)
Total Protein: 7.3 g/dL (ref 6.0–8.5)
eGFR: 74 mL/min/{1.73_m2} (ref >59)

## 2023-12-22 LAB — LIPID PANEL WITH LDL/HDL RATIO
Cholesterol, Total: 134 mg/dL (ref 100–199)
HDL: 52 mg/dL (ref >39)
LDL Chol Calc (NIH): 69 mg/dL (ref 0–99)
LDL/HDL Ratio: 1.3 {ratio} (ref 0.0–3.2)
Triglycerides: 61 mg/dL (ref 0–149)
VLDL Cholesterol Cal: 13 mg/dL (ref 5–40)

## 2023-12-22 LAB — INSULIN, RANDOM: INSULIN: 15.2 u[IU]/mL (ref 2.6–24.9)

## 2023-12-22 LAB — HEMOGLOBIN A1C
Est. average glucose Bld gHb Est-mCnc: 108 mg/dL
Hgb A1c MFr Bld: 5.4 % (ref 4.8–5.6)

## 2023-12-22 LAB — VITAMIN D 25 HYDROXY (VIT D DEFICIENCY, FRACTURES): Vit D, 25-Hydroxy: 77.1 ng/mL (ref 30.0–100.0)

## 2023-12-23 ENCOUNTER — Encounter (INDEPENDENT_AMBULATORY_CARE_PROVIDER_SITE_OTHER): Payer: Self-pay | Admitting: Family Medicine

## 2023-12-24 ENCOUNTER — Ambulatory Visit: Payer: Medicare PPO

## 2023-12-24 NOTE — Telephone Encounter (Signed)
 Please advise

## 2023-12-28 NOTE — Assessment & Plan Note (Signed)
 Elevated blood sugars previously.  She is taking trulicity weekly with better control of food intake particularly carbohydrates.  Will need repeat CMP, A1c and Insulin today.  No refill of trulicity needed.

## 2023-12-28 NOTE — Assessment & Plan Note (Signed)
 Last lipid panel from October 2024 showing a total cholesterol of 143 with an HDL of 50 triglycerides of 65 and an LDL of 80.  We will repeat fasting lipid panel today with him to discuss labs at next appointment

## 2023-12-28 NOTE — Assessment & Plan Note (Signed)
 Patient taking Rx Vitamin D and last level in October in the 30s.  Vitamin D level today.

## 2023-12-29 ENCOUNTER — Encounter: Payer: Self-pay | Admitting: Family Medicine

## 2023-12-29 ENCOUNTER — Ambulatory Visit (INDEPENDENT_AMBULATORY_CARE_PROVIDER_SITE_OTHER): Payer: Medicare PPO | Admitting: Family Medicine

## 2023-12-29 VITALS — BP 134/90 | HR 67 | Temp 98.4°F | Resp 16 | Ht 66.0 in | Wt 201.0 lb

## 2023-12-29 DIAGNOSIS — H6122 Impacted cerumen, left ear: Secondary | ICD-10-CM | POA: Diagnosis not present

## 2023-12-29 DIAGNOSIS — J301 Allergic rhinitis due to pollen: Secondary | ICD-10-CM

## 2023-12-29 MED ORDER — AZELASTINE HCL 0.1 % NA SOLN: 1.0000 | Freq: Two times a day (BID) | NASAL | 3 refills | Status: DC

## 2023-12-29 NOTE — Patient Instructions (Addendum)
 Earwax Buildup, Adult Your ears make something called earwax. It helps keep germs called bacteria away and protects the skin in your ears. Sometimes, too much earwax can build up. This can cause discomfort or make it harder to hear. What are the causes? Earwax buildup can happen when you have too much earwax in your ears. Earwax is made in the outer part of your ear canal. It's supposed to fall out in small amounts over time. But if your ears aren't able to clean themselves like they should, earwax can build up. What increases the risk? You're more likely to get earwax buildup if: You clean your ears with cotton swabs. You pick at your ears. You use earplugs or in-ear headphones a lot. You wear hearing aids. You may also be more likely to get it if: You're female. You're older. Your ears naturally make more earwax. You have narrow ear canals or extra hair in your ears. Your earwax is too thick or sticky. You have eczema. You're dehydrated. This means there's not enough fluid in your body. What are the signs or symptoms? Symptoms of earwax buildup include: Not being able to hear as well. A feeling of fullness in your ear. Feeling like your ear is plugged. Fluid coming from your ear. Ear pain or an itchy ear. Ringing in your ear. Coughing or problems with balance. How is this diagnosed? Earwax buildup may be diagnosed based on your symptoms, medical history, and an ear exam. During the exam, your health care provider will look into your ear with a tool called an otoscope. You may also have tests, such as a hearing test. How is this treated? Earwax buildup may be treated by: Using ear drops. Having the earwax removed by a provider. The provider may: Flush the ear with water. Use a tool called a curette that has a loop on the end. Use a suction device. Having surgery. This may be done in severe cases. Follow these instructions at home:  Cleaning your ears Clean your ears as told  by your provider. You can clean the outside of your ears with a washcloth or tissue. Do not overclean your ears. Do not put anything into your ear unless told. This includes cotton swabs. General instructions Take over-the-counter and prescription medicines only as told by your provider. Drink enough fluid to keep your pee (urine) pale yellow. This helps thin the earwax. If you have hearing aids, clean them as told. Keep all follow-up visits. If earwax builds up in your ears often or if you use hearing aids, ask your provider how often you should have your ears cleaned. Contact a health care provider if: Your ear pain gets worse. You have a fever. You have pus, blood, or other fluid coming from your ear. You have hearing loss. You have ringing in your ears that won't go away. You feel like the room is spinning. This is called vertigo. Your symptoms don't get better with treatment. This information is not intended to replace advice given to you by your health care provider. Make sure you discuss any questions you have with your health care provider. Document Revised: 01/01/2023 Document Reviewed: 01/01/2023 Elsevier Patient Education  2024 [employer]       Chlortrimeton---- antihistamine  Earwax Buildup, Adult Your ears make something called earwax. It helps keep germs called bacteria away and protects the skin in your ears. Sometimes, too much earwax can build up. This can cause discomfort or make it harder to hear. What are the causes? Earwax buildup can happen when you have too much earwax in your ears. Earwax is made in the outer part of your ear canal. It's supposed to fall out in small amounts over time. But if your ears aren't able to clean themselves like they should, earwax can build up. What increases the risk? You're more likely to get earwax buildup  if: You clean your ears with cotton swabs. You pick at your ears. You use earplugs or in-ear headphones a lot. You wear hearing aids. You may also be more likely to get it if: You're female. You're older. Your ears naturally make more earwax. You have narrow ear canals or extra hair in your ears. Your earwax is too thick or sticky. You have eczema. You're dehydrated. This means there's not enough fluid in your body. What are the signs or symptoms? Symptoms of earwax buildup include: Not being able to hear as well. A feeling of fullness in your ear. Feeling like your ear is plugged. Fluid coming from your ear. Ear pain or an itchy ear. Ringing in your ear. Coughing or problems with balance. How is this diagnosed? Earwax buildup may be diagnosed based on your symptoms, medical history, and an ear exam. During the exam, your health care provider will look into your ear with a tool called an otoscope. You may also have tests, such as a hearing test. How is this treated? Earwax buildup may be treated by: Using ear drops. Having the earwax removed by a provider. The provider may: Flush the ear with water. Use a tool called a curette that has a loop on the end. Use a suction device. Having surgery. This may be done in severe cases. Follow these instructions at home:  Cleaning your ears Clean your ears as told by your provider. You can clean the outside of your ears with a washcloth or tissue. Do not overclean your ears. Do not put anything into your ear unless told. This includes cotton swabs. General instructions Take over-the-counter and prescription medicines only as told by your provider. Drink enough fluid to keep your pee (urine) pale yellow. This helps thin the earwax. If you have hearing aids, clean them as told. Keep all follow-up visits. If earwax builds up in your ears often or if you use hearing aids, ask your provider how often you should have your ears  cleaned. Contact a health care provider if: Your ear pain gets worse. You have a fever. You have pus, blood, or other fluid coming from your ear. You have hearing loss. You have ringing in your ears that won't go away. You feel like the room is spinning. This is called vertigo. Your symptoms don't get better with treatment. This information is not intended to replace advice given to you by your health care provider. Make sure you discuss any questions you have with your health care provider. Document Revised: 01/01/2023 Document Reviewed: 01/01/2023 Elsevier Patient Education  2024 [employer].

## 2023-12-29 NOTE — Progress Notes (Signed)
 Established Patient Office Visit  Subjective   Patient ID: Alicia Knapp, female    DOB: 1956/04/29  Age: 68 y.o. MRN: [id number]  Chief Complaint  Patient presents with   Ear Fullness    Bilateral pressure, x4 weeks ago, no pain.    HPI Discussed the use of AI scribe software for clinical note transcription with the patient, who gave verbal consent to proceed.  History of Present Illness   Alicia Knapp is a 68 year old female who presents with ear discomfort and sinus issues.  She experiences discomfort in both ears, describing them as feeling 'sticky and full.' A previous evaluation suggested the presence of fluid, and she is concerned about the condition worsening. She notes that the left ear is worse than the right.  She also has issues with her sinuses, stating that her current medication, Xyzal, is no longer effective. She has been taking Zyrtec for about a month, which initially provided relief but has since stopped working. She experiences symptoms of watery eyes and nasal congestion and has been using nasal sprays, including Flonase.      Patient Active Problem List   Diagnosis Date Noted   SOBOE (shortness of breath on exertion) 12/17/2023   Class 1 obesity with serious comorbidity and body mass index (BMI) of 34.0 to 34.9 in adult 11/25/2023   Elevated random blood glucose level 09/14/2023   Constipation    Need for pneumococcal 20-valent conjugate vaccination 04/03/2023   Impacted cerumen of left ear 10/10/2022   Welcome to Medicare preventive visit 10/03/2022   COVID-19 03/11/2022   Prediabetes 03/06/2022   At risk for diarrhea 03/06/2022   Allergic contact dermatitis due to plants, except food 03/26/2021   Great toe pain, left 09/05/2019   Insulin resistance 11/23/2018   Vitamin D deficiency 11/10/2018   B12 nutritional deficiency 11/10/2018   Class 1 obesity with serious comorbidity and body mass index (BMI) of 30.0 to 30.9 in adult 09/02/2018   Chest pain  11/21/2015   OTHER ACUTE SINUSITIS 08/26/2010   Hyperlipidemia 07/26/2010   Seasonal allergic rhinitis 04/16/2009   Anxiety state 01/04/2008   Essential hypertension 01/04/2008   CARDIAC MURMUR 01/04/2008   Past Medical History:  Diagnosis Date   Anxiety    B12 deficiency    Constipation    Heart murmur    Hyperlipidemia    Hypertension    Joint pain    Postoperative nausea    Prediabetes    Swelling    lower ext   Vitamin D deficiency    Past Surgical History:  Procedure Laterality Date   ABDOMINAL HYSTERECTOMY     APPENDECTOMY     COLONOSCOPY  05/22/2015   Dr.Pyrtle   POLYPECTOMY     Social History   Tobacco Use   Smoking status: Former    Current packs/day: 0.00    Types: Cigarettes    Quit date: 09/17/2014    Years since quitting: 9.2   Smokeless tobacco: Never  Vaping Use   Vaping status: Never Used  Substance Use Topics   Alcohol use: No    Alcohol/week: 0.0 standard drinks of alcohol   Drug use: No   Social History   Socioeconomic History   Marital status: Married    Spouse name: Marnette Burgess   Number of children: Not on file   Years of education: Not on file   Highest education level: Not on file  Occupational History   Occupation: Stay at home spouse  Tobacco Use   Smoking status: Former    Current packs/day: 0.00    Types: Cigarettes    Quit date: 09/17/2014    Years since quitting: 9.2   Smokeless tobacco: Never  Vaping Use   Vaping status: Never Used  Substance and Sexual Activity   Alcohol use: No    Alcohol/week: 0.0 standard drinks of alcohol   Drug use: No   Sexual activity: Yes    Partners: Male    Birth control/protection: Post-menopausal  Other Topics Concern   Not on file  Social History Narrative   Lives in house with her husband   Social Drivers of Corporate investment banker Strain: Not on file  Food Insecurity: Not on file  Transportation Needs: Not on file  Physical Activity: Not on file  Stress: Not on file   Social Connections: Not on file  Intimate Partner Violence: Not on file   Family Status  Relation Name Status   Father  Deceased   Mother  Deceased   Sister  Alive   Brother  Alive   Brother Derek Deceased   MGM  (Not Specified)   Neg Hx  (Not Specified)  No partnership data on file   Family History  Problem Relation Age of Onset   Cancer Father        prostate   Prostate cancer Father    Diabetes Mother    Thyroid disease Mother    Hypertension Mother    Hyperlipidemia Mother    Kidney disease Mother    Cancer Mother    Sleep apnea Mother    Obesity Mother    Hypertension Sister    Hypertension Brother    Stroke Brother    Colon cancer Brother 63       died 21 - 4-5 months after dx   Arthritis Maternal Grandmother    Esophageal cancer Neg Hx    Rectal cancer Neg Hx    Stomach cancer Neg Hx    Allergies  Allergen Reactions   Erythromycin     Causes stomach cramps   Penicillins     rash      Review of Systems  Constitutional:  Negative for fever and malaise/fatigue.  HENT:  Positive for ear pain. Negative for congestion.   Eyes:  Negative for blurred vision.  Respiratory:  Negative for cough and shortness of breath.   Cardiovascular:  Negative for chest pain, palpitations and leg swelling.  Gastrointestinal:  Negative for abdominal pain, blood in stool, nausea and vomiting.  Genitourinary:  Negative for dysuria and frequency.  Musculoskeletal:  Negative for back pain and falls.  Skin:  Negative for rash.  Neurological:  Positive for loss of consciousness. Negative for dizziness and headaches.  Endo/Heme/Allergies:  Negative for environmental allergies.  Psychiatric/Behavioral:  Negative for depression. The patient is not nervous/anxious.       Objective:     BP (!) 134/90 (BP Location: Left Arm, Patient Position: Sitting)   Pulse 67   Temp 98.4 F (36.9 C) (Oral)   Resp 16   Ht 5\' 6"  (1.676 m)   Wt 201 lb (91.2 kg)   SpO2 99%   BMI 32.44 kg/m   BP Readings from Last 3 Encounters:  12/29/23 (!) 134/90  12/17/23 (!) 146/78  11/18/23 (!) 153/73   Wt Readings from Last 3 Encounters:  12/29/23 201 lb (91.2 kg)  12/17/23 197 lb (89.4 kg)  11/18/23 196 lb (88.9 kg)   SpO2 Readings from Last  3 Encounters:  12/29/23 99%  12/17/23 100%  11/18/23 100%      Physical Exam Vitals and nursing note reviewed.  Constitutional:      General: She is not in acute distress.    Appearance: Normal appearance. She is well-developed.  HENT:     Head: Normocephalic and atraumatic.     Comments: L ear ---  + cerumen impaction  Irrigated successfully with no complication  With pt permission  R ear ---  min amount of wax -- - irrigated successfully  TMI b/l       Right Ear: There is no impacted cerumen.     Left Ear: There is impacted cerumen.  Eyes:     General: No scleral icterus.       Right eye: No discharge.        Left eye: No discharge.  Cardiovascular:     Rate and Rhythm: Normal rate and regular rhythm.     Heart sounds: No murmur heard. Pulmonary:     Effort: Pulmonary effort is normal. No respiratory distress.     Breath sounds: Normal breath sounds.  Musculoskeletal:        General: Normal range of motion.     Cervical back: Normal range of motion and neck supple.     Right lower leg: No edema.     Left lower leg: No edema.  Skin:    General: Skin is warm and dry.  Neurological:     Mental Status: She is alert and oriented to person, place, and time.  Psychiatric:        Mood and Affect: Mood normal.        Behavior: Behavior normal.        Thought Content: Thought content normal.        Judgment: Judgment normal.      No results found for any visits on 12/29/23.  Last CBC Lab Results  Component Value Date   WBC 3.5 (L) 08/17/2023   HGB 12.2 08/17/2023   HCT 38.4 08/17/2023   MCV 89.0 08/17/2023   RDW 14.0 08/17/2023   PLT 366.0 08/17/2023   Last metabolic panel Lab Results  Component Value Date    GLUCOSE 83 12/17/2023   NA 140 12/17/2023   K 4.8 12/17/2023   CL 102 12/17/2023   CO2 25 12/17/2023   BUN 13 12/17/2023   CREATININE 0.87 12/17/2023   EGFR 74 12/17/2023   CALCIUM 10.6 (H) 12/17/2023   PROT 7.3 12/17/2023   ALBUMIN 4.3 12/17/2023   LABGLOB 3.0 12/17/2023   AGRATIO 1.4 01/21/2023   BILITOT 0.3 12/17/2023   ALKPHOS 79 12/17/2023   AST 20 12/17/2023   ALT 16 12/17/2023   Last lipids Lab Results  Component Value Date   CHOL 134 12/17/2023   HDL 52 12/17/2023   LDLCALC 69 12/17/2023   LDLDIRECT 140.5 08/12/2013   TRIG 61 12/17/2023   CHOLHDL 3 08/17/2023   Last hemoglobin A1c Lab Results  Component Value Date   HGBA1C 5.4 12/17/2023   Last thyroid functions Lab Results  Component Value Date   TSH 1.50 08/17/2023   T3TOTAL 105 04/08/2019   Last vitamin D Lab Results  Component Value Date   VD25OH 77.1 12/17/2023   Last vitamin B12 and Folate Lab Results  Component Value Date   VITAMINB12 588 08/17/2023   FOLATE 7.6 04/08/2019      The 10-year ASCVD risk score (Arnett DK, et al., 2019) is: 7.5%  Assessment & Plan:   Problem List Items Addressed This Visit       Unprioritized   Seasonal allergic rhinitis   Relevant Medications   azelastine (ASTELIN) 0.1 % nasal spray   Impacted cerumen of left ear - Primary  Assessment and Plan    Cerumen Impaction Experiencing bilateral ear discomfort with fullness and stickiness. Examination shows cerumen in both ears, more pronounced in the left. The cerumen does not completely obstruct the eardrums. Agreed to ear irrigation. Irrigate both ears, focusing on the left.  Allergic Rhinitis Xyzal is ineffective for sinus symptoms, including watery eyes and nasal congestion. Zyrtec provided limited relief for about a month. Currently using Flonase nasal spray. Agreed to add Astelin nasal spray and try chlorpheniramine (Chlor-Trimeton). Add Astelin nasal spray with Flonase. Recommend over-the-counter  chlorpheniramine, 12-hour formulations. Send prescription for Astelin to [facility].        Return if symptoms worsen or fail to improve.    Donato Schultz, DO

## 2024-01-04 DIAGNOSIS — F3181 Bipolar II disorder: Secondary | ICD-10-CM | POA: Diagnosis not present

## 2024-01-05 ENCOUNTER — Ambulatory Visit
Admission: RE | Admit: 2024-01-05 | Discharge: 2024-01-05 | Disposition: A | Discharge status: Home / Self Care | Payer: Medicare PPO | Source: Ambulatory Visit | Attending: Family Medicine | Admitting: Family Medicine

## 2024-01-05 DIAGNOSIS — Z1231 Encounter for screening mammogram for malignant neoplasm of breast: Secondary | ICD-10-CM

## 2024-01-11 ENCOUNTER — Other Ambulatory Visit: Payer: Self-pay | Admitting: Family Medicine

## 2024-01-11 DIAGNOSIS — R928 Other abnormal and inconclusive findings on diagnostic imaging of breast: Secondary | ICD-10-CM

## 2024-01-14 ENCOUNTER — Ambulatory Visit (INDEPENDENT_AMBULATORY_CARE_PROVIDER_SITE_OTHER): Payer: Medicare PPO | Admitting: Family Medicine

## 2024-01-14 ENCOUNTER — Encounter (INDEPENDENT_AMBULATORY_CARE_PROVIDER_SITE_OTHER): Payer: Self-pay | Admitting: Family Medicine

## 2024-01-14 VITALS — BP 164/76 | HR 71 | Temp 97.8°F | Ht 66.0 in | Wt 196.0 lb

## 2024-01-14 DIAGNOSIS — I10 Essential (primary) hypertension: Secondary | ICD-10-CM

## 2024-01-14 DIAGNOSIS — E66811 Obesity, class 1: Secondary | ICD-10-CM | POA: Diagnosis not present

## 2024-01-14 DIAGNOSIS — Z6831 Body mass index (BMI) 31.0-31.9, adult: Secondary | ICD-10-CM | POA: Diagnosis not present

## 2024-01-14 DIAGNOSIS — R739 Hyperglycemia, unspecified: Secondary | ICD-10-CM | POA: Diagnosis not present

## 2024-01-14 MED ORDER — TRULICITY 3 MG/0.5ML ~~LOC~~ SOAJ: 3.0000 mg | SUBCUTANEOUS | 0 refills | Status: DC

## 2024-01-14 NOTE — Assessment & Plan Note (Signed)
 Doing well on trulicity with no Gi side effects.  She feels like she is at point with controling her food intake.  She needs a refill of Trulicity today at current dose.  [facility]

## 2024-01-14 NOTE — Progress Notes (Signed)
   SUBJECTIVE:  Chief Complaint: Obesity  Interim History: patient had mammogram done last week and she was concerned.  She was also worried about her labs done at last appointment.  She has started her garden recently too.  She has started lettuce, swiss chard, kale, two types of lettuces.  She is considering starting additional plants too.  She has been consistent with her food plan the last couple of weeks.  Exercise wise M, W, F she is doing weight training and then Tuesday and Thursday she does core exercises. Only thing she has coming up is [place].  Alicia Knapp is here to discuss her progress with her obesity treatment plan. She is on the Category 3 Plan and states she is following her eating plan approximately 85 % of the time. She states she is weight training and core 20-30 minutes 2-3 times per week.   OBJECTIVE: Visit Diagnoses: Problem List Items Addressed This Visit       Cardiovascular and Mediastinum   Essential hypertension   Patient has been more consistent with her food intake and activity level recently.  Blood pressure still slightly elevated today but normal at home.  No changes to meds at this time. Follow up BP at next appointments.        Other   Elevated random blood glucose level - Primary   Doing well on trulicity with no Gi side effects.  She feels like she is at point with controling her food intake.  She needs a refill of Trulicity today at current dose.  [facility]       Relevant Medications   Dulaglutide (TRULICITY) 3 MG/0.5ML SOAJ   Class 1 obesity with serious comorbidity and body mass index (BMI) of 34.0 to 34.9 in adult   Relevant Medications   Dulaglutide (TRULICITY) 3 MG/0.5ML SOAJ   Other Visit Diagnoses       BMI 31.0-31.9,adult           No data recorded  No data recorded  No data recorded  No data recorded     01/14/2024    1:00 PM 12/29/2023    9:37 AM 12/17/2023    7:00 AM  Vitals with BMI   Height 5\' 6"  5\' 6"  5\' 6"   Weight 196 lbs 201 lbs 197 lbs  BMI 31.65 32.46 31.81  Systolic 164 134 295  Diastolic 76 90 78  Pulse 71 67 68      ASSESSMENT AND PLAN:  Diet: Alicia Knapp is currently in the action stage of change. As such, her goal is to continue with weight loss efforts and has agreed to the Category 3 Plan.   Exercise:  Older adults should follow the adult guidelines. When older adults cannot meet the adult guidelines, they should be as physically active as their abilities and conditions will allow.  Behavior Modification:  We discussed the following Behavioral Modification Strategies today: increasing lean protein intake, increase H2O intake, meal planning and cooking strategies, better snacking choices, and planning for success.   No follow-ups on file.[name] She was informed of the importance of frequent follow up visits to maximize her success with intensive lifestyle modifications for her multiple health conditions.  Attestation Statements:   Reviewed by clinician on day of visit: allergies, medications, problem list, medical history, surgical history, family history, social history, and previous encounter notes.     Reuben Likes, MD

## 2024-01-20 NOTE — Assessment & Plan Note (Signed)
 Patient has been more consistent with her food intake and activity level recently.  Blood pressure still slightly elevated today but normal at home.  No changes to meds at this time. Follow up BP at next appointments.

## 2024-01-25 ENCOUNTER — Other Ambulatory Visit: Payer: Self-pay | Admitting: Family Medicine

## 2024-01-25 ENCOUNTER — Other Ambulatory Visit

## 2024-01-25 ENCOUNTER — Ambulatory Visit
Admission: RE | Admit: 2024-01-25 | Discharge: 2024-01-25 | Disposition: A | Discharge status: Home / Self Care | Source: Ambulatory Visit | Attending: Family Medicine | Admitting: Family Medicine

## 2024-01-25 DIAGNOSIS — R928 Other abnormal and inconclusive findings on diagnostic imaging of breast: Secondary | ICD-10-CM

## 2024-01-25 DIAGNOSIS — R921 Mammographic calcification found on diagnostic imaging of breast: Secondary | ICD-10-CM

## 2024-01-29 ENCOUNTER — Ambulatory Visit
Admission: RE | Admit: 2024-01-29 | Discharge: 2024-01-29 | Disposition: A | Discharge status: Home / Self Care | Source: Ambulatory Visit | Attending: Family Medicine | Admitting: Family Medicine

## 2024-01-29 DIAGNOSIS — R921 Mammographic calcification found on diagnostic imaging of breast: Secondary | ICD-10-CM | POA: Diagnosis not present

## 2024-01-29 DIAGNOSIS — D0512 Intraductal carcinoma in situ of left breast: Secondary | ICD-10-CM | POA: Diagnosis not present

## 2024-01-29 DIAGNOSIS — R92332 Mammographic heterogeneous density, left breast: Secondary | ICD-10-CM | POA: Diagnosis not present

## 2024-01-29 HISTORY — PX: BREAST BIOPSY: SHX20

## 2024-02-01 LAB — SURGICAL PATHOLOGY

## 2024-02-02 ENCOUNTER — Telehealth: Payer: Self-pay | Admitting: *Deleted

## 2024-02-02 NOTE — Telephone Encounter (Signed)
 Spoke to patient to confirm upcoming morning [facility] clinic appointment on 4/9, paperwork will be sent via e-mail  Gave location and time, also informed patient that the surgeon's office would be calling as well to get information from them similar to the packet that they will be receiving so make sure to do both.  Reminded patient that all providers will be coming to the clinic to see them HERE and if they had any questions to not hesitate to reach back out to myself or their navigators.

## 2024-02-08 ENCOUNTER — Encounter: Payer: Self-pay | Admitting: *Deleted

## 2024-02-08 DIAGNOSIS — D0512 Intraductal carcinoma in situ of left breast: Secondary | ICD-10-CM | POA: Insufficient documentation

## 2024-02-09 ENCOUNTER — Telehealth: Payer: Self-pay

## 2024-02-09 ENCOUNTER — Ambulatory Visit (INDEPENDENT_AMBULATORY_CARE_PROVIDER_SITE_OTHER): Admitting: Family Medicine

## 2024-02-09 NOTE — Progress Notes (Signed)
 Radiation Oncology         [phone] ________________________________  Initial Outpatient Consultation  Name: Alicia Knapp MRN: [id number]  Date: 02/10/2024  DOB: 04/13/1956  WU:[id number] Chase, Grayling Congress, DO  Emelia Loron, MD   REFERRING PHYSICIAN: Emelia Loron, MD  DIAGNOSIS: No diagnosis found.   Cancer Staging  No matching staging information was found for the patient.  Stage 0 (cTis (DCIS), cN0, cM0) Left Breast UIQ, Intermediate grade ductal carcinoma in situ, ER+ / PR+ / Her2 not assessed  CHIEF COMPLAINT: Here to discuss management of Left breast cancer  HISTORY OF PRESENT ILLNESS::Alicia Knapp is a 68 y.o. female who presented with bilateral breast abnormalities on the following imaging: bilateral screening mammogram on the date of 01/05/24. No symptoms, if any, were reported at that time. A bilateral diagnostic mammogram and right breast ultrasound were subsequently performed on 01/25/24 which demonstrated a suspicious area of calcifications in the upper inner left breast spanning approximately 1.9 cm. No abnormalities were seen in the right breast to correlate with the previously demonstrated screening abnormality, and no evidence of right axillary lymphadenopathy was demonstrated. (Sonographic evaluation of the left breast/axilla was not performed).   Biopsy of the upper inner left breast calcifications on date of 01/29/24 showed intermediate grade DCIS measuring 0.5 cm in the greatest linear extent of the sample, along with necrosis.  ER status: 100% positive and PR status 80% positive, both with strong staining intensity; Her2 not assessed. No lymph nodes were examined.   ***  PREVIOUS RADIATION THERAPY: No  PAST MEDICAL HISTORY:  has a past medical history of Anxiety, B12 deficiency, Constipation, Heart murmur, Hyperlipidemia, Hypertension, Joint pain, Postoperative nausea, Prediabetes, Swelling, and Vitamin D deficiency.    PAST SURGICAL HISTORY: Past  Surgical History:  Procedure Laterality Date   ABDOMINAL HYSTERECTOMY     APPENDECTOMY     BREAST BIOPSY Left 01/29/2024   MM LT BREAST BX W LOC DEV 1ST LESION IMAGE BX SPEC STEREO GUIDE 01/29/2024 [facility]   COLONOSCOPY  05/22/2015   Dr.Pyrtle   POLYPECTOMY      FAMILY HISTORY: family history includes Arthritis in her maternal grandmother; Cancer in her mother; Cancer - Prostate in her father; Colon cancer (age of onset: 52) in her brother; Diabetes in her mother; Hyperlipidemia in her mother; Hypertension in her brother, mother, and sister; Kidney disease in her mother; Obesity in her mother; Prostate cancer in her father; Sleep apnea in her mother; Stroke in her brother; Thyroid disease in her mother.  SOCIAL HISTORY:  reports that she quit smoking about 9 years ago. Her smoking use included cigarettes. She has never used smokeless tobacco. She reports that she does not drink alcohol and does not use drugs.  ALLERGIES: Erythromycin and Penicillins  MEDICATIONS:  Current Outpatient Medications  Medication Sig Dispense Refill   amLODipine (NORVASC) 10 MG tablet TAKE 1 TABLET BY MOUTH EVERY DAY 90 tablet 1   ARIPiprazole (ABILIFY) 2 MG tablet Take 2 mg by mouth daily.      ARIPiprazole (ABILIFY) 5 MG tablet Take 1 tablet by mouth daily.  0   atorvastatin (LIPITOR) 10 MG tablet Take 1 tablet (10 mg total) by mouth daily. 90 tablet 3   azelastine (ASTELIN) 0.1 % nasal spray Place 1 spray into both nostrils 2 (two) times daily. Use in each nostril as directed 90 mL 3   carvedilol (COREG) 25 MG tablet Take 1 tablet (25 mg total) by mouth 2 (two) times daily with  a meal. 180 tablet 3   Dulaglutide (TRULICITY) 3 MG/0.5ML SOAJ Inject 3 mg as directed once a week. 6 mL 0   fluticasone (FLONASE) 50 MCG/ACT nasal spray Place 2 sprays into both nostrils daily. 48 g 3   levocetirizine (XYZAL) 5 MG tablet Take 1 tablet (5 mg total) by mouth every evening. 90 tablet 3    losartan-hydrochlorothiazide (HYZAAR) 100-25 MG tablet Take 1 tablet by mouth daily. 90 tablet 3   metFORMIN (GLUCOPHAGE) 500 MG tablet Take 1 tablet (500 mg total) by mouth daily with breakfast. 90 tablet 0   Vitamin D, Ergocalciferol, (DRISDOL) 1.25 MG (50000 UNIT) CAPS capsule Take 1 capsule (50,000 Units total) by mouth every 7 (seven) days. 12 capsule 0   No current facility-administered medications for this encounter.    REVIEW OF SYSTEMS: As above in HPI.   PHYSICAL EXAM:  vitals were not taken for this visit.   General: Alert and oriented, in no acute distress HEENT: Head is normocephalic. Extraocular movements are intact.  Heart: Regular in rate and rhythm with no murmurs, rubs, or gallops. Chest: Clear to auscultation bilaterally, with no rhonchi, wheezes, or rales. Abdomen: Soft, nontender, nondistended, with no rigidity or guarding. Extremities: No cyanosis or edema. Skin: No concerning lesions. Musculoskeletal: symmetric strength and muscle tone throughout. Neurologic: Cranial nerves II through XII are grossly intact. No obvious focalities. Speech is fluent. Coordination is intact. Psychiatric: Judgment and insight are intact. Affect is appropriate. Breasts: *** . No other palpable masses appreciated in the breasts or axillae *** .    ECOG = ***  0 - Asymptomatic (Fully active, able to carry on all predisease activities without restriction)  1 - Symptomatic but completely ambulatory (Restricted in physically strenuous activity but ambulatory and able to carry out work of a light or sedentary nature. For example, light housework, office work)  2 - Symptomatic, <50% in bed during the day (Ambulatory and capable of all self care but unable to carry out any work activities. Up and about more than 50% of waking hours)  3 - Symptomatic, >50% in bed, but not bedbound (Capable of only limited self-care, confined to bed or chair 50% or more of waking hours)  4 - Bedbound  (Completely disabled. Cannot carry on any self-care. Totally confined to bed or chair)  5 - Death   Santiago Glad MM, Creech RH, Tormey DC, et al. [phone]). "Toxicity and response criteria of the [facility]". Am. Evlyn Clines. Oncol. 5 (6): 649-55   LABORATORY DATA:   CBC    Component Value Date/Time   WBC 3.5 (L) 08/17/2023 0943   RBC 4.32 08/17/2023 0943   HGB 12.2 08/17/2023 0943   HCT 38.4 08/17/2023 0943   PLT 366.0 08/17/2023 0943   MCV 89.0 08/17/2023 0943   MCHC 31.6 08/17/2023 0943   RDW 14.0 08/17/2023 0943   LYMPHSABS 1.1 08/17/2023 0943   MONOABS 0.3 08/17/2023 0943   EOSABS 0.0 08/17/2023 0943   BASOSABS 0.0 08/17/2023 0943    CMP     Component Value Date/Time   NA 140 12/17/2023 0848   K 4.8 12/17/2023 0848   CL 102 12/17/2023 0848   CO2 25 12/17/2023 0848   GLUCOSE 83 12/17/2023 0848   GLUCOSE 91 08/17/2023 0943   BUN 13 12/17/2023 0848   CREATININE 0.87 12/17/2023 0848   CALCIUM 10.6 (H) 12/17/2023 0848   PROT 7.3 12/17/2023 0848   ALBUMIN 4.3 12/17/2023 0848   AST 20 12/17/2023 0848  ALT 16 12/17/2023 0848   ALKPHOS 79 12/17/2023 0848   BILITOT 0.3 12/17/2023 0848   GFR 86.16 08/17/2023 0943   EGFR 74 12/17/2023 0848   GFRNONAA 74 12/25/2020 1155      RADIOGRAPHY: MM LT BREAST BX W LOC DEV 1ST LESION IMAGE BX SPEC STEREO GUIDE Addendum Date: 02/02/2024 ADDENDUM REPORT: 02/02/2024 07:17 ADDENDUM: Pathology revealed DUCTAL CARCINOMA IN SITU, CRIBRIFORM AND COMEDO, INTERMEDIATE NUCLEAR GRADE, NECROSIS: PRESENT, CALCIFICATIONS: PRESENT of the LEFT breast, upper inner, (x clip). This was found to be concordant by Dr. Edwin Cap. Pathology results were discussed with the patient by telephone. The patient reported doing well after the biopsy with tenderness at the site. Post biopsy instructions and care were reviewed and questions were answered. The patient was encouraged to call [facility] for any additional  concerns. My direct phone number was provided. The patient was referred to [facility] on February 10, 2024. Pathology results reported by Rene Kocher, RN on 02/01/2024. Electronically Signed   By: Edwin Cap M.D.   On: 02/02/2024 07:17   Result Date: 02/02/2024 CLINICAL DATA:  Patient presents for stereotactic guided core biopsy of a suspicious 1.9 cm group of calcifications in the upper inner left breast. EXAM: LEFT BREAST STEREOTACTIC CORE NEEDLE BIOPSY COMPARISON:  Previous exam(s). FINDINGS: The patient and I discussed the procedure of stereotactic-guided biopsy including benefits and alternatives. We discussed the high likelihood of a successful procedure. We discussed the risks of the procedure including infection, bleeding, tissue injury, clip migration, and inadequate sampling. Informed written consent was given. The usual time out protocol was performed immediately prior to the procedure. Using sterile technique and 1% Lidocaine as local anesthetic, under stereotactic guidance, a 9 gauge vacuum assisted device was used to perform core needle biopsy of the calcifications in the upper inner left breast using a superior to inferior approach. Specimen radiograph was performed showing the presence of calcifications. Specimens with calcifications are identified for pathology. Lesion quadrant: Upper inner At the conclusion of the procedure, an X shaped tissue marker clip was deployed into the biopsy cavity. Follow-up 2-view mammogram was performed and dictated separately. IMPRESSION: Stereotactic-guided biopsy of the calcifications in the upper inner left breast. No apparent complications. Electronically Signed: By: Edwin Cap M.D. On: 01/29/2024 08:03   MM CLIP PLACEMENT LEFT Result Date: 01/29/2024 CLINICAL DATA:  Post stereotactic guided core biopsy of calcifications in the upper inner left breast. EXAM: 3D DIAGNOSTIC LEFT  MAMMOGRAM POST STEREOTACTIC BIOPSY COMPARISON:  Previous exam(s). ACR Breast Density Category c: The breasts are heterogeneously dense, which may obscure small masses. FINDINGS: 3D Mammographic images were obtained following stereotactic guided biopsy of a suspicious 1.9 cm group of calcifications in the upper inner left breast. An X shaped biopsy marking clip is present at the site of the biopsied calcifications in the upper inner left breast. IMPRESSION: X shaped biopsy marking clip at site of biopsied calcifications in the upper inner left breast. Final Assessment: Post Procedure Mammograms for Marker Placement Electronically Signed   By: Edwin Cap M.D.   On: 01/29/2024 08:03   MM 3D DIAGNOSTIC MAMMOGRAM BILATERAL BREAST Result Date: 01/25/2024 CLINICAL DATA:  Patient was recalled from screening mammogram for possible asymmetry in the right breast and calcifications in the left breast. EXAM: DIGITAL DIAGNOSTIC BILATERAL MAMMOGRAM WITH TOMOSYNTHESIS AND CAD; ULTRASOUND RIGHT BREAST LIMITED TECHNIQUE: Bilateral digital diagnostic mammography and breast tomosynthesis was performed. The images  were evaluated with computer-aided detection. ; Targeted ultrasound examination of the right breast was performed COMPARISON:  Previous exam(s). ACR Breast Density Category c: The breasts are heterogeneously dense, which may obscure small masses. FINDINGS: Additional imaging of the right breast was performed. The asymmetry in the retroareolar region of the right breast appears to disperse on the additional imaging. Additional imaging of the left breast was performed. There are branching linear calcifications in the upper-inner quadrant of the left breast spanning 1.9 cm. They are worrisome for ductal carcinoma in-situ. Targeted ultrasound is performed, showing normal tissue in the retroareolar region of the right breast. There is no suspicious mass. IMPRESSION: Suspicious calcifications in the upper inner quadrant of  the left breast spanning 1.9 cm. RECOMMENDATION: Stereotactic biopsy of the suspicious calcifications in the upper inner quadrant of the left breast is recommended. I have discussed the findings and recommendations with the patient. If applicable, a reminder letter will be sent to the patient regarding the next appointment. BI-RADS CATEGORY  4: Suspicious. Electronically Signed   By: Baird Lyons M.D.   On: 01/25/2024 11:25   [name] LIMITED ULTRASOUND INCLUDING AXILLA RIGHT BREAST Result Date: 01/25/2024 CLINICAL DATA:  Patient was recalled from screening mammogram for possible asymmetry in the right breast and calcifications in the left breast. EXAM: DIGITAL DIAGNOSTIC BILATERAL MAMMOGRAM WITH TOMOSYNTHESIS AND CAD; ULTRASOUND RIGHT BREAST LIMITED TECHNIQUE: Bilateral digital diagnostic mammography and breast tomosynthesis was performed. The images were evaluated with computer-aided detection. ; Targeted ultrasound examination of the right breast was performed COMPARISON:  Previous exam(s). ACR Breast Density Category c: The breasts are heterogeneously dense, which may obscure small masses. FINDINGS: Additional imaging of the right breast was performed. The asymmetry in the retroareolar region of the right breast appears to disperse on the additional imaging. Additional imaging of the left breast was performed. There are branching linear calcifications in the upper-inner quadrant of the left breast spanning 1.9 cm. They are worrisome for ductal carcinoma in-situ. Targeted ultrasound is performed, showing normal tissue in the retroareolar region of the right breast. There is no suspicious mass. IMPRESSION: Suspicious calcifications in the upper inner quadrant of the left breast spanning 1.9 cm. RECOMMENDATION: Stereotactic biopsy of the suspicious calcifications in the upper inner quadrant of the left breast is recommended. I have discussed the findings and recommendations with the patient. If applicable, a reminder  letter will be sent to the patient regarding the next appointment. BI-RADS CATEGORY  4: Suspicious. Electronically Signed   By: Baird Lyons M.D.   On: 01/25/2024 11:25      IMPRESSION/PLAN: ***   It was a pleasure meeting the patient today. We discussed the risks, benefits, and side effects of radiotherapy. I recommend radiotherapy to the *** to reduce her risk of locoregional recurrence by 2/3.  We discussed that radiation would take approximately *** weeks to complete and that I would give the patient a few weeks to heal following surgery before starting treatment planning. *** If chemotherapy were to be given, this would precede radiotherapy. We spoke about acute effects including skin irritation and fatigue as well as much less common late effects including internal organ injury or irritation. We spoke about the latest technology that is used to minimize the risk of late effects for patients undergoing radiotherapy to the breast or chest wall. No guarantees of treatment were given. The patient is enthusiastic about proceeding with treatment. I look forward to participating in the patient's care.  I will await her referral back  to me for postoperative follow-up and eventual CT simulation/treatment planning.  On date of service, in total, I spent *** minutes on this encounter. Patient was seen in person.   __________________________________________   Lonie Peak, MD  This document serves as a record of services personally performed by Lonie Peak, MD. It was created on her behalf by Neena Rhymes, a trained medical scribe. The creation of this record is based on the scribe's personal observations and the provider's statements to them. This document has been checked and approved by the attending provider.

## 2024-02-09 NOTE — Telephone Encounter (Signed)
 Spoke with patient and confirmed appointment on 4/9... also confirmed she received paperwork and advised her to bring it with her to the appointment .[name] Patient confirmed and understood.[name]

## 2024-02-10 ENCOUNTER — Encounter: Payer: Self-pay | Admitting: Radiation Oncology

## 2024-02-10 ENCOUNTER — Inpatient Hospital Stay: Attending: Hematology and Oncology

## 2024-02-10 ENCOUNTER — Inpatient Hospital Stay: Admitting: Genetic Counselor

## 2024-02-10 ENCOUNTER — Encounter: Payer: Self-pay | Admitting: *Deleted

## 2024-02-10 ENCOUNTER — Ambulatory Visit
Admission: RE | Admit: 2024-02-10 | Discharge: 2024-02-10 | Disposition: A | Discharge status: Home / Self Care | Source: Ambulatory Visit | Attending: Radiation Oncology | Admitting: Radiation Oncology

## 2024-02-10 ENCOUNTER — Inpatient Hospital Stay: Admitting: Licensed Clinical Social Worker

## 2024-02-10 ENCOUNTER — Inpatient Hospital Stay: Admitting: Hematology and Oncology

## 2024-02-10 ENCOUNTER — Encounter: Payer: Self-pay | Admitting: Genetic Counselor

## 2024-02-10 ENCOUNTER — Ambulatory Visit: Admitting: Physical Therapy

## 2024-02-10 ENCOUNTER — Other Ambulatory Visit: Payer: Self-pay | Admitting: General Surgery

## 2024-02-10 VITALS — BP 158/58 | HR 68 | Temp 98.1°F | Resp 17 | Ht 65.35 in | Wt 202.8 lb

## 2024-02-10 DIAGNOSIS — Z7985 Long-term (current) use of injectable non-insulin antidiabetic drugs: Secondary | ICD-10-CM | POA: Insufficient documentation

## 2024-02-10 DIAGNOSIS — D0512 Intraductal carcinoma in situ of left breast: Secondary | ICD-10-CM

## 2024-02-10 DIAGNOSIS — E785 Hyperlipidemia, unspecified: Secondary | ICD-10-CM | POA: Diagnosis not present

## 2024-02-10 DIAGNOSIS — Z803 Family history of malignant neoplasm of breast: Secondary | ICD-10-CM

## 2024-02-10 DIAGNOSIS — R7303 Prediabetes: Secondary | ICD-10-CM | POA: Insufficient documentation

## 2024-02-10 DIAGNOSIS — I10 Essential (primary) hypertension: Secondary | ICD-10-CM

## 2024-02-10 DIAGNOSIS — Z8041 Family history of malignant neoplasm of ovary: Secondary | ICD-10-CM

## 2024-02-10 DIAGNOSIS — Z8042 Family history of malignant neoplasm of prostate: Secondary | ICD-10-CM

## 2024-02-10 LAB — CBC WITH DIFFERENTIAL (CANCER CENTER ONLY)
Abs Immature Granulocytes: 0.03 10*3/uL (ref 0.00–0.07)
Basophils Absolute: 0 10*3/uL (ref 0.0–0.1)
Basophils Relative: 1 %
Eosinophils Absolute: 0.1 10*3/uL (ref 0.0–0.5)
Eosinophils Relative: 1 %
HCT: 37.8 % (ref 36.0–46.0)
Hemoglobin: 12.5 g/dL (ref 12.0–15.0)
Immature Granulocytes: 1 %
Lymphocytes Relative: 34 %
Lymphs Abs: 1.4 10*3/uL (ref 0.7–4.0)
MCH: 28.8 pg (ref 26.0–34.0)
MCHC: 33.1 g/dL (ref 30.0–36.0)
MCV: 87.1 fL (ref 80.0–100.0)
Monocytes Absolute: 0.4 10*3/uL (ref 0.1–1.0)
Monocytes Relative: 9 %
Neutro Abs: 2.3 10*3/uL (ref 1.7–7.7)
Neutrophils Relative %: 54 %
Platelet Count: 376 10*3/uL (ref 150–400)
RBC: 4.34 MIL/uL (ref 3.87–5.11)
RDW: 13.2 % (ref 11.5–15.5)
WBC Count: 4.3 10*3/uL (ref 4.0–10.5)
nRBC: 0 % (ref 0.0–0.2)

## 2024-02-10 LAB — CMP (CANCER CENTER ONLY)
ALT: 19 U/L (ref 0–44)
AST: 14 U/L — ABNORMAL LOW (ref 15–41)
Albumin: 4.6 g/dL (ref 3.5–5.0)
Alkaline Phosphatase: 76 U/L (ref 38–126)
Anion gap: 6 (ref 5–15)
BUN: 17 mg/dL (ref 8–23)
CO2: 31 mmol/L (ref 22–32)
Calcium: 10.5 mg/dL — ABNORMAL HIGH (ref 8.9–10.3)
Chloride: 102 mmol/L (ref 98–111)
Creatinine: 0.72 mg/dL (ref 0.44–1.00)
GFR, Estimated: >60 mL/min (ref >60)
Glucose, Bld: 85 mg/dL (ref 70–99)
Potassium: 4.5 mmol/L (ref 3.5–5.1)
Sodium: 139 mmol/L (ref 135–145)
Total Bilirubin: 0.5 mg/dL (ref 0.0–1.2)
Total Protein: 8.4 g/dL — ABNORMAL HIGH (ref 6.5–8.1)

## 2024-02-10 NOTE — Progress Notes (Signed)
 [facility] Clinical Social Work  Initial Assessment   Alicia Knapp is a 68 y.o. year old female accompanied by spouse, Minerva Areola. Clinical Social Work was referred by  [facility]  for assessment of psychosocial needs.   SDOH (Social Determinants of Health) assessments performed: Yes SDOH Interventions    Flowsheet Row Clinical Support from 02/10/2024 in [facility] - A Dept Of [place]. [facility]  SDOH Interventions   Food Insecurity Interventions Intervention Not Indicated  Housing Interventions Intervention Not Indicated  Transportation Interventions Intervention Not Indicated  Utilities Interventions Intervention Not Indicated       SDOH Screenings   Food Insecurity: No Food Insecurity (02/10/2024)  Housing: Low Risk  (02/10/2024)  Transportation Needs: No Transportation Needs (02/10/2024)  Utilities: Not At Risk (02/10/2024)  Depression (PHQ2-9): Low Risk  (08/17/2023)  Tobacco Use: Medium Risk (02/10/2024)   Received from [facility]     Distress Screen completed: No     No data to display            Family/Social Information:  Housing Arrangement: patient lives with husband Family members/support persons in your life? Family Transportation concerns: no  Employment: Retired .  Income source: Actor concerns: No Type of concern: None Food access concerns: no Religious or spiritual practice:  Yes Advanced directives: No- discussed today Services Currently in place:  [employer] Medicare + [employer]are  Coping/ Adjustment to diagnosis: Patient understands treatment plan and what happens next? yes, feels better after meeting with medical team and learning about diagnosis and plan (lump, XRT, AI) Patient reported stressors: Adjusting to my illness Patient enjoys gardening and crafts Current coping skills/ strengths: Ability for insight , Capable of independent living , Communication skills , Motivation for treatment/growth ,  Special hobby/interest , and Supportive family/friends     SUMMARY: Current SDOH Barriers:  No major barriers noted today  Clinical Social Work Clinical Goal(s):  No clinical social work goals at this time  Interventions: Discussed common feeling and emotions when being diagnosed with cancer, and the importance of support during treatment Informed patient of the support team roles and support services at [facility] Provided CSW contact information and encouraged patient to call with any questions or concerns   Follow Up Plan: Patient will contact CSW with any support or resource needs Patient verbalizes understanding of plan: Yes    Nazareth Kirk E Love Chowning, LCSW Clinical Social Worker [facility]

## 2024-02-10 NOTE — Progress Notes (Signed)
 REFERRING PROVIDER: Rachel Moulds, MD  PRIMARY PROVIDER:  Zola Button, Grayling Congress, DO  PRIMARY REASON FOR VISIT:  Encounter Diagnoses  Name Primary?   Ductal carcinoma in situ (DCIS) of left breast Yes   Family history of breast cancer    Family history of ovarian cancer    Family history of prostate cancer    HISTORY OF PRESENT ILLNESS:   Ms. Bobst, a 68 y.o. female, was seen for a [facility] cancer genetics consultation at the request of Dr. Al Pimple due to a personal and family history of cancer.  Ms. Rolland presents to clinic today to discuss the possibility of a hereditary predisposition to cancer, to discuss genetic testing, and to further clarify her future cancer risks, as well as potential cancer risks for family members.   In March 2025, at the age of 46, Ms. 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   CANCER HISTORY:  Oncology History  Ductal carcinoma in situ (DCIS) of left breast  01/05/2024 Mammogram   Mammogram showed possible asymmetry in the right breast, possible calcs in the left breast. Suspicious calcifications in the upper inner quadrant of the left breast spanning 1.9 cm.   01/29/2024 Pathology Results   Left breast needle core biopsy upper inner quadrant showed DCIS, cribriform and comedo, intermediate nuclear grade,  ER 100% positive, strong staining intensity, PR 80% positive strong staining intensity.   02/08/2024 Initial Diagnosis   Ductal carcinoma in situ (DCIS) of left breast   02/10/2024 Cancer Staging   Staging form: Breast, AJCC 8th Edition - Clinical stage from 02/10/2024: Stage 0 (cTis (DCIS), cN0, cM0, ER+, PR+, HER2: Not Assessed) - Signed by Rachel Moulds, MD on 02/10/2024 Stage prefix: Initial diagnosis Nuclear grade: G2 Laterality: Left Staged by: Pathologist and managing physician Stage used in treatment planning: Yes National guidelines used in treatment planning: Yes Type of national guideline  used in treatment planning: NCCN     Past Medical History:  Diagnosis Date   Anxiety    B12 deficiency    Constipation    Heart murmur    Hyperlipidemia    Hypertension    Joint pain    Postoperative nausea    Prediabetes    Swelling    lower ext   Vitamin D deficiency     Past Surgical History:  Procedure Laterality Date   ABDOMINAL HYSTERECTOMY     APPENDECTOMY     BREAST BIOPSY Left 01/29/2024   MM LT BREAST BX W LOC DEV 1ST LESION IMAGE BX SPEC STEREO GUIDE 01/29/2024 [facility]   COLONOSCOPY  05/22/2015   Dr.Pyrtle   POLYPECTOMY      Social History   Socioeconomic History   Marital status: Married    Spouse name: Marnette Burgess   Number of children: Not on file   Years of education: Not on file   Highest education level: Not on file  Occupational History   Occupation: Stay at home spouse  Tobacco Use   Smoking status: Former    Current packs/day: 0.00    Types: Cigarettes    Quit date: 09/17/2014    Years since quitting: 9.4   Smokeless tobacco: Never  Vaping Use   Vaping status: Never Used  Substance and Sexual Activity   Alcohol use: No    Alcohol/week: 0.0 standard drinks of alcohol   Drug use: No   Sexual activity: Yes    Partners: Male    Birth control/protection: Post-menopausal  Other Topics Concern   Not on file  Social History Narrative   Lives in house with her husband   Social Drivers of Health   Financial Resource Strain: Not on file  Food Insecurity: No Food Insecurity (02/10/2024)   Hunger Vital Sign    Worried About Running Out of Food in the Last Year: Never true    Ran Out of Food in the Last Year: Never true  Transportation Needs: No Transportation Needs (02/10/2024)   PRAPARE - Administrator, Civil Service (Medical): No    Lack of Transportation (Non-Medical): No  Physical Activity: Not on file  Stress: Not on file  Social Connections: Not on file     FAMILY HISTORY:  We obtained a detailed, 4-generation  family history.  Significant diagnoses are listed below: Family History  Problem Relation Age of Onset   Thyroid cancer Mother    Prostate cancer Father        metastatic   Prostate cancer Brother    Liver cancer Brother 77       died 61 - 4-5 months after dx   Breast cancer Cousin 8 - 50   Ovarian cancer Cousin       Ms. Harten is unaware of previous family history of genetic testing for hereditary cancer risks. There is no reported Ashkenazi Jewish ancestry.   GENETIC COUNSELING ASSESSMENT: Ms. Rubalcava is a 68 y.o. female with a personal and family history of cancer which is somewhat suggestive of a hereditary predisposition to cancer. We, therefore, discussed and recommended the following at today's visit.   DISCUSSION: We discussed that 5 - 10% of cancer is hereditary, with most cases of breast cancer associated with BRCA1/2.  There are other genes that can be associated with hereditary breast cancer syndromes.  We discussed that testing is beneficial for several reasons including knowing how to follow individuals after completing their treatment, identifying whether potential treatment options would be beneficial, and understanding if other family members could be at risk for cancer and allowing them to undergo genetic testing.   We reviewed the characteristics, features and inheritance patterns of hereditary cancer syndromes. We also discussed genetic testing, including the appropriate family members to test, the process of testing, insurance coverage and turn-around-time for results. We discussed the implications of a negative, positive, carrier and/or variant of uncertain significant result. We recommended Ms. Henner pursue genetic testing for a panel that includes genes associated with cancer.   Based on Ms. Farabee's personal and family history of cancer, she meets medical criteria for genetic testing. Despite that she meets criteria, she may still have an out of pocket cost. We  discussed that if her out of pocket cost for testing is over $100, the laboratory should contact them to discuss self-pay prices, patient pay assistance programs, if applicable, and other billing options.  PLAN: Ms. Manson Passey declined genetic testing at today's visit, but she is interested in testing and plans to reach out to her insurance regarding cost. She said she will contact us and let us know if she would like to pursue testing after speaking with them. Of note, she said genetic testing results would not impact her surgery decisions.   Ms. Holsapple questions were answered to her satisfaction today. Our contact information was provided should additional questions or concerns arise. Thank you for the referral and allowing [name] to share in the care of your patient.   Lalla Brothers, MS, [facility] Genetic Counselor [email or web address] (P) [phone]  40 minutes were spent on the date of the encounter in service to the patient including preparation, face-to-face consultation, documentation and care coordination.  The patient brought her husband. Drs. Pamelia Hoit and/or Mosetta Putt were available to discuss this case as needed.   _______________________________________________________________________ For Office Staff:  Number of people involved in session: 2 Was an Intern/ student involved with case: no

## 2024-02-10 NOTE — Progress Notes (Signed)
 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 It is considered a precancerous lesion. DCIS is classified as a Stage 0 breast cancer. It is generally detected through mammograms as calcifications. We discussed the significance of grades and its impact on prognosis. We also discussed the importance of ER and PR receptors and their implications to adjuvant treatment options. Prognosis of DCIS dependence on grade and degree of comedo necrosis. It is anticipated that if not treated, 20-30% of DCIS can develop into invasive breast cancer.  Recommendation: 1. Breast conserving surgery 2. Followed by adjuvant radiation therapy 3. Followed by antiestrogen therapy with tamoxifen/aromatase inhibitors based on menopausal status 5 years  Tamoxifen counseling: We discussed the risks and benefits of tamoxifen. These include but not limited to insomnia, hot flashes, mood changes, vaginal dryness, and weight gain. Although rare, serious side effects including endometrial cancer, risk of blood clots were also discussed. We strongly believe that the benefits far outweigh the risks. Patient understands these risks and consented to starting treatment. Planned treatment duration is 5 years.  Aromatase inhibitors counseling: We have discussed the  mechanism of action of aromatase inhibitors today.  We have discussed adverse effects including but not limited to menopausal symptoms, increased risk of osteoporosis and fractures, cardiovascular events, arthralgias and myalgias.  We do believe that the benefits far outweigh the risks.  Plan treatment duration of 5 years.   Prediabetes Currently on Trulicity for weight loss, effectiveness questioned.  Hypertension Chronic condition.  Hyperlipidemia Improved cholesterol levels in recent labs.  Follow-up - Schedule follow-up post-radiation to start anastrozole therapy.    HISTORY OF PRESENTING ILLNESS:  Alicia Knapp 68 y.o. female is here because of DCIS  Oncology History  Ductal carcinoma in situ (DCIS) of left breast  01/05/2024 Mammogram   Mammogram showed possible asymmetry in the right breast, possible calcs in the left breast. Suspicious calcifications in the upper inner quadrant of the left breast spanning 1.9 cm.   01/29/2024 Pathology Results   Left breast needle core biopsy upper inner quadrant showed DCIS, cribriform and comedo, intermediate nuclear grade,  ER 100% positive, strong staining intensity, PR 80% positive strong staining intensity.   02/08/2024 Initial Diagnosis   Ductal carcinoma in situ (DCIS) of left breast   02/10/2024 Cancer Staging   Staging form: Breast, AJCC 8th Edition - Clinical stage from 02/10/2024: Stage 0 (cTis (DCIS), cN0, cM0, ER+, PR+, HER2: Not Assessed) - Signed by Rachel Moulds, MD on 02/10/2024 Stage prefix: Initial diagnosis Nuclear grade: G2 Laterality: Left Staged by: Pathologist and managing physician Stage used in treatment planning: Yes National guidelines used in treatment planning: Yes Type of national guideline used in treatment planning: NCCN    History of Present Illness Alicia Knapp is a 68 year old female with ductal carcinoma in situ (DCIS) who presents for oncology consultation regarding treatment options.  She has  ductal carcinoma in situ (  DCIS) in the left breast, with calcifications identified in the upper inner quadrant. A biopsy confirmed the presence of DCIS, which is considered stage zero breast cancer.  Her past medical history includes prediabetes, hypertension, and hyperlipidemia. She is currently on Trulicity, which was prescribed to aid in weight loss. Her last cholesterol levels were reported to be good. She has a history of a total hysterectomy performed in the 1990s and an appendectomy in the 1980s. She has never used hormone replacement therapy.  No hot flashes or history of blood clots. She does not have a uterus, so there are no concerns regarding tamoxifen's effects on the uterus. She has a history of an inverted nipple, which is not a new finding.  Her family history is significant for prostate cancer in her father and brother, and breast cancer in a maternal first cousin.  She quit smoking in 2006 and does not consume alcohol. She is retired and is up to date with her colonoscopy and bone density screenings.  All other systems were reviewed with the patient and are negative.  MEDICAL HISTORY:  Past Medical History:  Diagnosis Date   Anxiety    B12 deficiency    Constipation    Heart murmur    Hyperlipidemia    Hypertension    Joint pain    Postoperative nausea    Prediabetes    Swelling    lower ext   Vitamin D deficiency     SURGICAL HISTORY: Past Surgical History:  Procedure Laterality Date   ABDOMINAL HYSTERECTOMY     APPENDECTOMY     BREAST BIOPSY Left 01/29/2024   MM LT BREAST BX W LOC DEV 1ST LESION IMAGE BX SPEC STEREO GUIDE 01/29/2024 [facility]MO[facility]   COLONOSCOPY  05/22/2015   Dr.Pyrtle   POLYPECTOMY      SOCIAL HISTORY: Social History   Socioeconomic History   Marital status: Married    Spouse name: Marnette Burgess   Number of children: Not on file   Years of education: Not on file   Highest education level: Not on file  Occupational History    Occupation: Stay at home spouse  Tobacco Use   Smoking status: Former    Current packs/day: 0.00    Types: Cigarettes    Quit date: 09/17/2014    Years since quitting: 9.4   Smokeless tobacco: Never  Vaping Use   Vaping status: Never Used  Substance and Sexual Activity   Alcohol use: No    Alcohol/week: 0.0 standard drinks of alcohol   Drug use: No   Sexual activity: Yes    Partners: Male    Birth control/protection: Post-menopausal  Other Topics Concern   Not on file  Social History Narrative   Lives in house with her husband   Social Drivers of Corporate investment banker Strain: Not on file  Food Insecurity: No Food Insecurity (02/10/2024)   Hunger Vital Sign    Worried About Running Out of Food in the Last Year: Never true    Ran Out of Food in the Last Year: Never true  Transportation Needs: No Transportation Needs (02/10/2024)   PRAPARE - Administrator, Civil Service (Medical): No    Lack of Transportation (Non-Medical): No  Physical Activity: Not on file  Stress: Not on file  Social Connections: Not on file  Intimate Partner Violence: Not At Risk (02/10/2024)   Humiliation, Afraid, Rape, and Kick questionnaire    Fear of Current or Ex-Partner: No  Emotionally Abused: No    Physically Abused: No    Sexually Abused: No    FAMILY HISTORY: Family History  Problem Relation Age of Onset   Diabetes Mother    Thyroid disease Mother    Hypertension Mother    Hyperlipidemia Mother    Kidney disease Mother    Cancer Mother    Sleep apnea Mother    Obesity Mother    Prostate cancer Father    Cancer - Prostate Father    Hypertension Sister    Arthritis Maternal Grandmother    Hypertension Brother    Stroke Brother    Colon cancer Brother 63       died 33 - 4-5 months after dx   Esophageal cancer Neg Hx    Rectal cancer Neg Hx    Stomach cancer Neg Hx    Breast cancer Neg Hx     ALLERGIES:  is allergic to erythromycin and  penicillins.  MEDICATIONS:  Current Outpatient Medications  Medication Sig Dispense Refill   amLODipine (NORVASC) 10 MG tablet TAKE 1 TABLET BY MOUTH EVERY DAY 90 tablet 1   ARIPiprazole (ABILIFY) 2 MG tablet Take 2 mg by mouth daily.      ARIPiprazole (ABILIFY) 5 MG tablet Take 1 tablet by mouth daily.  0   atorvastatin (LIPITOR) 10 MG tablet Take 1 tablet (10 mg total) by mouth daily. 90 tablet 3   azelastine (ASTELIN) 0.1 % nasal spray Place 1 spray into both nostrils 2 (two) times daily. Use in each nostril as directed 90 mL 3   carvedilol (COREG) 25 MG tablet Take 1 tablet (25 mg total) by mouth 2 (two) times daily with a meal. 180 tablet 3   Dulaglutide (TRULICITY) 3 MG/0.5ML SOAJ Inject 3 mg as directed once a week. 6 mL 0   fluticasone (FLONASE) 50 MCG/ACT nasal spray Place 2 sprays into both nostrils daily. 48 g 3   losartan-hydrochlorothiazide (HYZAAR) 100-25 MG tablet Take 1 tablet by mouth daily. 90 tablet 3   metFORMIN (GLUCOPHAGE) 500 MG tablet Take 1 tablet (500 mg total) by mouth daily with breakfast. 90 tablet 0   levocetirizine (XYZAL) 5 MG tablet Take 1 tablet (5 mg total) by mouth every evening. 90 tablet 3   Vitamin D, Ergocalciferol, (DRISDOL) 1.25 MG (50000 UNIT) CAPS capsule Take 1 capsule (50,000 Units total) by mouth every 7 (seven) days. 12 capsule 0   No current facility-administered medications for this visit.     PHYSICAL EXAMINATION: ECOG PERFORMANCE STATUS: 0 - Asymptomatic  Vitals:   02/10/24 0842  BP: (!) 158/58  Pulse: 68  Resp: 17  Temp: 98.1 F (36.7 C)  SpO2: 100%   Filed Weights   02/10/24 0842  Weight: 202 lb 12.8 oz (92 kg)    GENERAL:alert, no distress and comfortable SKIN: skin color, texture, turgor are normal, no rashes or significant lesions EYES: normal, conjunctiva are pink and non-injected, sclera clear OROPHARYNX:no exudate, no erythema and lips, buccal mucosa, and tongue normal  NECK: supple, thyroid normal size,  non-tender, without nodularity LYMPH:  no palpable lymphadenopathy in the cervical, axillary  LUNGS: clear to auscultation and percussion with normal breathing effort HEART: regular rate & rhythm and no murmurs and no lower extremity edema ABDOMEN:abdomen soft, non-tender and normal bowel sounds Musculoskeletal:no cyanosis of digits and no clubbing  PSYCH: alert & oriented x 3 with fluent speech NEURO: no focal motor/sensory deficits Breast: Post biopsy changed noted in the left breast  LABORATORY DATA:  I have reviewed the data as listed Lab Results  Component Value Date   WBC 4.3 02/10/2024   HGB 12.5 02/10/2024   HCT 37.8 02/10/2024   MCV 87.1 02/10/2024   PLT 376 02/10/2024     Chemistry      Component Value Date/Time   NA 139 02/10/2024 1123   NA 140 12/17/2023 0848   K 4.5 02/10/2024 1123   CL 102 02/10/2024 1123   CO2 31 02/10/2024 1123   BUN 17 02/10/2024 1123   BUN 13 12/17/2023 0848   CREATININE 0.72 02/10/2024 1123      Component Value Date/Time   CALCIUM 10.5 (H) 02/10/2024 1123   ALKPHOS 76 02/10/2024 1123   AST 14 (L) 02/10/2024 1123   ALT 19 02/10/2024 1123   BILITOT 0.5 02/10/2024 1123       RADIOGRAPHIC STUDIES: I have personally reviewed the radiological images as listed and agreed with the findings in the report. MM LT BREAST BX W LOC DEV 1ST LESION IMAGE BX SPEC STEREO GUIDE Addendum Date: 02/02/2024 ADDENDUM REPORT: 02/02/2024 07:17 ADDENDUM: Pathology revealed DUCTAL CARCINOMA IN SITU, CRIBRIFORM AND COMEDO, INTERMEDIATE NUCLEAR GRADE, NECROSIS: PRESENT, CALCIFICATIONS: PRESENT of the LEFT breast, upper inner, (x clip). This was found to be concordant by Dr. Edwin Cap. Pathology results were discussed with the patient by telephone. The patient reported doing well after the biopsy with tenderness at the site. Post biopsy instructions and care were reviewed and questions were answered. The patient was encouraged to call [facility] for any additional concerns. My direct phone number was provided. The patient was referred to [facility] [place] on February 10, 2024. Pathology results reported by Rene Kocher, RN on 02/01/2024. Electronically Signed   By: Edwin Cap M.D.   On: 02/02/2024 07:17   Result Date: 02/02/2024 CLINICAL DATA:  Patient presents for stereotactic guided core biopsy of a suspicious 1.9 cm group of calcifications in the upper inner left breast. EXAM: LEFT BREAST STEREOTACTIC CORE NEEDLE BIOPSY COMPARISON:  Previous exam(s). FINDINGS: The patient and I discussed the procedure of stereotactic-guided biopsy including benefits and alternatives. We discussed the high likelihood of a successful procedure. We discussed the risks of the procedure including infection, bleeding, tissue injury, clip migration, and inadequate sampling. Informed written consent was given. The usual time out protocol was performed immediately prior to the procedure. Using sterile technique and 1% Lidocaine as local anesthetic, under stereotactic guidance, a 9 gauge vacuum assisted device was used to perform core needle biopsy of the calcifications in the upper inner left breast using a superior to inferior approach. Specimen radiograph was performed showing the presence of calcifications. Specimens with calcifications are identified for pathology. Lesion quadrant: Upper inner At the conclusion of the procedure, an X shaped tissue marker clip was deployed into the biopsy cavity. Follow-up 2-view mammogram was performed and dictated separately. IMPRESSION: Stereotactic-guided biopsy of the calcifications in the upper inner left breast. No apparent complications. Electronically Signed: By: Edwin Cap M.D. On: 01/29/2024 08:03   MM CLIP PLACEMENT LEFT Result Date: 01/29/2024 CLINICAL DATA:  Post stereotactic guided core biopsy of calcifications in the upper inner  left breast. EXAM: 3D DIAGNOSTIC LEFT MAMMOGRAM POST STEREOTACTIC BIOPSY COMPARISON:  Previous exam(s). ACR Breast Density Category c: The breasts are heterogeneously dense, which may obscure small masses. FINDINGS: 3D Mammographic images were obtained following stereotactic guided biopsy of a suspicious 1.9 cm group of calcifications  in the upper inner left breast. An X shaped biopsy marking clip is present at the site of the biopsied calcifications in the upper inner left breast. IMPRESSION: X shaped biopsy marking clip at site of biopsied calcifications in the upper inner left breast. Final Assessment: Post Procedure Mammograms for Marker Placement Electronically Signed   By: Edwin Cap M.D.   On: 01/29/2024 08:03   MM 3D DIAGNOSTIC MAMMOGRAM BILATERAL BREAST Result Date: 01/25/2024 CLINICAL DATA:  Patient was recalled from screening mammogram for possible asymmetry in the right breast and calcifications in the left breast. EXAM: DIGITAL DIAGNOSTIC BILATERAL MAMMOGRAM WITH TOMOSYNTHESIS AND CAD; ULTRASOUND RIGHT BREAST LIMITED TECHNIQUE: Bilateral digital diagnostic mammography and breast tomosynthesis was performed. The images were evaluated with computer-aided detection. ; Targeted ultrasound examination of the right breast was performed COMPARISON:  Previous exam(s). ACR Breast Density Category c: The breasts are heterogeneously dense, which may obscure small masses. FINDINGS: Additional imaging of the right breast was performed. The asymmetry in the retroareolar region of the right breast appears to disperse on the additional imaging. Additional imaging of the left breast was performed. There are branching linear calcifications in the upper-inner quadrant of the left breast spanning 1.9 cm. They are worrisome for ductal carcinoma in-situ. Targeted ultrasound is performed, showing normal tissue in the retroareolar region of the right breast. There is no suspicious mass. IMPRESSION: Suspicious  calcifications in the upper inner quadrant of the left breast spanning 1.9 cm. RECOMMENDATION: Stereotactic biopsy of the suspicious calcifications in the upper inner quadrant of the left breast is recommended. I have discussed the findings and recommendations with the patient. If applicable, a reminder letter will be sent to the patient regarding the next appointment. BI-RADS CATEGORY  4: Suspicious. Electronically Signed   By: Baird Lyons M.D.   On: 01/25/2024 11:25   [name] LIMITED ULTRASOUND INCLUDING AXILLA RIGHT BREAST Result Date: 01/25/2024 CLINICAL DATA:  Patient was recalled from screening mammogram for possible asymmetry in the right breast and calcifications in the left breast. EXAM: DIGITAL DIAGNOSTIC BILATERAL MAMMOGRAM WITH TOMOSYNTHESIS AND CAD; ULTRASOUND RIGHT BREAST LIMITED TECHNIQUE: Bilateral digital diagnostic mammography and breast tomosynthesis was performed. The images were evaluated with computer-aided detection. ; Targeted ultrasound examination of the right breast was performed COMPARISON:  Previous exam(s). ACR Breast Density Category c: The breasts are heterogeneously dense, which may obscure small masses. FINDINGS: Additional imaging of the right breast was performed. The asymmetry in the retroareolar region of the right breast appears to disperse on the additional imaging. Additional imaging of the left breast was performed. There are branching linear calcifications in the upper-inner quadrant of the left breast spanning 1.9 cm. They are worrisome for ductal carcinoma in-situ. Targeted ultrasound is performed, showing normal tissue in the retroareolar region of the right breast. There is no suspicious mass. IMPRESSION: Suspicious calcifications in the upper inner quadrant of the left breast spanning 1.9 cm. RECOMMENDATION: Stereotactic biopsy of the suspicious calcifications in the upper inner quadrant of the left breast is recommended. I have discussed the findings and recommendations  with the patient. If applicable, a reminder letter will be sent to the patient regarding the next appointment. BI-RADS CATEGORY  4: Suspicious. Electronically Signed   By: Baird Lyons M.D.   On: 01/25/2024 11:25    All questions were answered. The patient knows to call the clinic with any problems, questions or concerns. I spent 45 minutes in the care of this patient including H and P, review of records, counseling and  coordination of care.     Rachel Moulds, MD 02/10/2024 12:34 PM

## 2024-02-11 ENCOUNTER — Encounter (INDEPENDENT_AMBULATORY_CARE_PROVIDER_SITE_OTHER): Payer: Self-pay | Admitting: Family Medicine

## 2024-02-12 ENCOUNTER — Other Ambulatory Visit: Payer: Self-pay | Admitting: *Deleted

## 2024-02-12 ENCOUNTER — Other Ambulatory Visit: Payer: Self-pay | Admitting: General Surgery

## 2024-02-12 DIAGNOSIS — D0512 Intraductal carcinoma in situ of left breast: Secondary | ICD-10-CM

## 2024-02-15 ENCOUNTER — Ambulatory Visit (INDEPENDENT_AMBULATORY_CARE_PROVIDER_SITE_OTHER): Payer: Medicare PPO | Admitting: Family Medicine

## 2024-02-15 ENCOUNTER — Encounter: Payer: Self-pay | Admitting: Family Medicine

## 2024-02-15 DIAGNOSIS — E785 Hyperlipidemia, unspecified: Secondary | ICD-10-CM

## 2024-02-15 DIAGNOSIS — J301 Allergic rhinitis due to pollen: Secondary | ICD-10-CM

## 2024-02-15 DIAGNOSIS — E559 Vitamin D deficiency, unspecified: Secondary | ICD-10-CM

## 2024-02-15 DIAGNOSIS — J302 Other seasonal allergic rhinitis: Secondary | ICD-10-CM | POA: Diagnosis not present

## 2024-02-15 DIAGNOSIS — I10 Essential (primary) hypertension: Secondary | ICD-10-CM

## 2024-02-15 DIAGNOSIS — R739 Hyperglycemia, unspecified: Secondary | ICD-10-CM | POA: Diagnosis not present

## 2024-02-15 MED ORDER — AMLODIPINE BESYLATE 10 MG PO TABS
10.0000 mg | ORAL_TABLET | Freq: Every day | ORAL | 1 refills | Status: DC
Start: 2024-02-15 — End: 2024-08-15

## 2024-02-15 MED ORDER — LOSARTAN POTASSIUM-HCTZ 100-25 MG PO TABS
1.0000 | ORAL_TABLET | Freq: Every day | ORAL | 3 refills | Status: DC
Start: 2024-02-15 — End: 2024-08-15

## 2024-02-15 MED ORDER — METFORMIN HCL 500 MG PO TABS: 500.0000 mg | ORAL_TABLET | Freq: Every day | ORAL | 3 refills | Status: DC

## 2024-02-15 MED ORDER — FLUTICASONE PROPIONATE 50 MCG/ACT NA SUSP: 2.0000 | Freq: Every day | NASAL | 3 refills | Status: DC

## 2024-02-15 MED ORDER — AZELASTINE HCL 0.1 % NA SOLN: 1.0000 | Freq: Two times a day (BID) | NASAL | 3 refills | Status: AC

## 2024-02-15 MED ORDER — ATORVASTATIN CALCIUM 10 MG PO TABS
10.0000 mg | ORAL_TABLET | Freq: Every day | ORAL | 3 refills | Status: DC
Start: 2024-02-15 — End: 2024-08-15

## 2024-02-15 NOTE — Progress Notes (Signed)
 .   Established Patient Office Visit  Subjective   Patient ID: Alicia Knapp, female    DOB: [birth year]  Age: 68 y.o. MRN: [id number]  Chief Complaint  Patient presents with   Follow-up    No concerns  Refills     HPI Discussed the use of AI scribe software for clinical note transcription with the patient, who gave verbal consent to proceed.  History of Present Illness Alicia Knapp is a 68 year old female with breast cancer who presents for pre-operative evaluation and medication refills.  She has been fitted for compression garments to use post-surgery. No chemotherapy is planned.  She requests refills on all her medications, including metformin, vitamin D, and carvedilol. She plans to discontinue Trulicity and will manage that with her other healthcare provider. She continues to use Astelin, which effectively manages her nasal symptoms.  She notes a family history of ankle swelling, as her grandmother experienced similar symptoms. She mentions mild ankle swelling, which she attributes to heredity, but it is not severe. No significant swelling in the ankles is reported.   Patient Active Problem List   Diagnosis Date Noted   Ductal carcinoma in situ (DCIS) of left breast 02/08/2024   SOBOE (shortness of breath on exertion) 12/17/2023   Class 1 obesity with serious comorbidity and body mass index (BMI) of 34.0 to 34.9 in adult 11/25/2023   Elevated random blood glucose level 09/14/2023   Constipation    Need for pneumococcal 20-valent conjugate vaccination 04/03/2023   Impacted cerumen of left ear 10/10/2022   Welcome to Medicare preventive visit 10/03/2022   COVID-19 03/11/2022   Prediabetes 03/06/2022   At risk for diarrhea 03/06/2022   Allergic contact dermatitis due to plants, except food 03/26/2021   Great toe pain, left 09/05/2019   Insulin resistance 11/23/2018   Vitamin D deficiency 11/10/2018   B12 nutritional deficiency 11/10/2018   Class 1 obesity with serious  comorbidity and body mass index (BMI) of 30.0 to 30.9 in adult 09/02/2018   Chest pain 11/21/2015   OTHER ACUTE SINUSITIS 08/26/2010   Hyperlipidemia 07/26/2010   Seasonal allergic rhinitis 04/16/2009   Anxiety state 01/04/2008   Essential hypertension 01/04/2008   CARDIAC MURMUR 01/04/2008   Past Medical History:  Diagnosis Date   Anxiety    B12 deficiency    Constipation    Heart murmur    Hyperlipidemia    Hypertension    Joint pain    Postoperative nausea    Prediabetes    Swelling    lower ext   Vitamin D deficiency    Past Surgical History:  Procedure Laterality Date   ABDOMINAL HYSTERECTOMY     APPENDECTOMY     BREAST BIOPSY Left 01/29/2024   MM LT BREAST BX W LOC DEV 1ST LESION IMAGE BX SPEC STEREO GUIDE 01/29/2024 [facility]   COLONOSCOPY  05/22/2015   Dr.Pyrtle   POLYPECTOMY     Social History   Tobacco Use   Smoking status: Former    Current packs/day: 0.00    Types: Cigarettes    Quit date: 09/17/2014    Years since quitting: 9.4   Smokeless tobacco: Never  Vaping Use   Vaping status: Never Used  Substance Use Topics   Alcohol use: No    Alcohol/week: 0.0 standard drinks of alcohol   Drug use: No   Social History   Socioeconomic History   Marital status: Married    Spouse name: Carlisle Cheshire   Number of  children: Not on file   Years of education: Not on file   Highest education level: Not on file  Occupational History   Occupation: Stay at home spouse  Tobacco Use   Smoking status: Former    Current packs/day: 0.00    Types: Cigarettes    Quit date: 09/17/2014    Years since quitting: 9.4   Smokeless tobacco: Never  Vaping Use   Vaping status: Never Used  Substance and Sexual Activity   Alcohol use: No    Alcohol/week: 0.0 standard drinks of alcohol   Drug use: No   Sexual activity: Yes    Partners: Male    Birth control/protection: Post-menopausal  Other Topics Concern   Not on file  Social History Narrative   Lives in  house with her husband   Social Drivers of Corporate investment banker Strain: Not on file  Food Insecurity: No Food Insecurity (02/10/2024)   Hunger Vital Sign    Worried About Running Out of Food in the Last Year: Never true    Ran Out of Food in the Last Year: Never true  Transportation Needs: No Transportation Needs (02/10/2024)   PRAPARE - Administrator, Civil Service (Medical): No    Lack of Transportation (Non-Medical): No  Physical Activity: Not on file  Stress: Not on file  Social Connections: Not on file  Intimate Partner Violence: Not At Risk (02/10/2024)   Humiliation, Afraid, Rape, and Kick questionnaire    Fear of Current or Ex-Partner: No    Emotionally Abused: No    Physically Abused: No    Sexually Abused: No   Family Status  Relation Name Status   Mother  Deceased   Father  Deceased   Sister  Alive   Brother  Alive   Brother Derek Deceased   MGM  (Not Specified)   Cousin  Alive   Neg Hx  (Not Specified)  No partnership data on file   Family History  Problem Relation Age of Onset   Diabetes Mother    Thyroid disease Mother    Hypertension Mother    Hyperlipidemia Mother    Kidney disease Mother    Thyroid cancer Mother    Sleep apnea Mother    Obesity Mother    Prostate cancer Father        metastatic   Hypertension Sister    Hypertension Brother    Prostate cancer Brother    Stroke Brother    Liver cancer Brother 63       died 8 - 4-5 months after dx   Arthritis Maternal Grandmother    Breast cancer Cousin 20 - 29   Ovarian cancer Cousin    Esophageal cancer Neg Hx    Rectal cancer Neg Hx    Stomach cancer Neg Hx    Allergies  Allergen Reactions   Erythromycin     Causes stomach cramps   Penicillins     rash      Review of Systems  Constitutional:  Negative for chills, fever and malaise/fatigue.  HENT:  Negative for congestion and hearing loss.   Eyes:  Negative for blurred vision and discharge.  Respiratory:  Negative  for cough, sputum production and shortness of breath.   Cardiovascular:  Negative for chest pain, palpitations and leg swelling.  Gastrointestinal:  Negative for abdominal pain, blood in stool, constipation, diarrhea, heartburn, nausea and vomiting.  Genitourinary:  Negative for dysuria, frequency, hematuria and urgency.  Musculoskeletal:  Negative  for back pain, falls and myalgias.  Skin:  Negative for rash.  Neurological:  Negative for dizziness, sensory change, loss of consciousness, weakness and headaches.  Endo/Heme/Allergies:  Negative for environmental allergies. Does not bruise/bleed easily.  Psychiatric/Behavioral:  Negative for depression and suicidal ideas. The patient is not nervous/anxious and does not have insomnia.       Objective:     BP 132/78 (BP Location: Left Arm, Patient Position: Sitting, Cuff Size: Normal)   Pulse 77   Ht 5' 5.35" (1.66 m)   Wt 202 lb 6.4 oz (91.8 kg)   SpO2 100%   BMI 33.32 kg/m  BP Readings from Last 3 Encounters:  02/15/24 132/78  02/10/24 (!) 158/58  01/14/24 (!) 164/76   Wt Readings from Last 3 Encounters:  02/15/24 202 lb 6.4 oz (91.8 kg)  02/10/24 202 lb 12.8 oz (92 kg)  01/14/24 196 lb (88.9 kg)   SpO2 Readings from Last 3 Encounters:  02/15/24 100%  02/10/24 100%  01/14/24 100%      Physical Exam Vitals and nursing note reviewed.  Constitutional:      General: She is not in acute distress.    Appearance: Normal appearance. She is well-developed.  HENT:     Head: Normocephalic and atraumatic.  Eyes:     General: No scleral icterus.       Right eye: No discharge.        Left eye: No discharge.  Cardiovascular:     Rate and Rhythm: Normal rate and regular rhythm.     Heart sounds: No murmur heard. Pulmonary:     Effort: Pulmonary effort is normal. No respiratory distress.     Breath sounds: Normal breath sounds.  Musculoskeletal:        General: Normal range of motion.     Cervical back: Normal range of motion and  neck supple.     Right lower leg: No edema.     Left lower leg: No edema.  Skin:    General: Skin is warm and dry.  Neurological:     Mental Status: She is alert and oriented to person, place, and time.  Psychiatric:        Mood and Affect: Mood normal.        Behavior: Behavior normal.        Thought Content: Thought content normal.        Judgment: Judgment normal.      No results found for any visits on 02/15/24.  Last CBC Lab Results  Component Value Date   WBC 4.3 02/10/2024   HGB 12.5 02/10/2024   HCT 37.8 02/10/2024   MCV 87.1 02/10/2024   MCH 28.8 02/10/2024   RDW 13.2 02/10/2024   PLT 376 02/10/2024   Last metabolic panel Lab Results  Component Value Date   GLUCOSE 85 02/10/2024   NA 139 02/10/2024   K 4.5 02/10/2024   CL 102 02/10/2024   CO2 31 02/10/2024   BUN 17 02/10/2024   CREATININE 0.72 02/10/2024   GFRNONAA >60 02/10/2024   CALCIUM 10.5 (H) 02/10/2024   PROT 8.4 (H) 02/10/2024   ALBUMIN 4.6 02/10/2024   LABGLOB 3.0 12/17/2023   AGRATIO 1.4 01/21/2023   BILITOT 0.5 02/10/2024   ALKPHOS 76 02/10/2024   AST 14 (L) 02/10/2024   ALT 19 02/10/2024   ANIONGAP 6 02/10/2024   Last lipids Lab Results  Component Value Date   CHOL 134 12/17/2023   HDL 52 12/17/2023   LDLCALC 69  12/17/2023   LDLDIRECT 140.5 08/12/2013   TRIG 61 12/17/2023   CHOLHDL 3 08/17/2023   Last hemoglobin A1c Lab Results  Component Value Date   HGBA1C 5.4 12/17/2023   Last thyroid functions Lab Results  Component Value Date   TSH 1.50 08/17/2023   T3TOTAL 105 04/08/2019   Last vitamin D Lab Results  Component Value Date   VD25OH 77.1 12/17/2023   Last vitamin B12 and Folate Lab Results  Component Value Date   VITAMINB12 588 08/17/2023   FOLATE 7.6 04/08/2019      The 10-year ASCVD risk score (Arnett DK, et al., 2019) is: 7.3%    Assessment & Plan:   Problem List Items Addressed This Visit       Unprioritized   Seasonal allergic rhinitis    Relevant Medications   azelastine (ASTELIN) 0.1 % nasal spray   fluticasone (FLONASE) 50 MCG/ACT nasal spray   Vitamin D deficiency   Hyperlipidemia   Relevant Medications   amLODipine (NORVASC) 10 MG tablet   atorvastatin (LIPITOR) 10 MG tablet   losartan-hydrochlorothiazide (HYZAAR) 100-25 MG tablet   Elevated random blood glucose level   Relevant Medications   metFORMIN (GLUCOPHAGE) 500 MG tablet   Essential hypertension   Relevant Medications   amLODipine (NORVASC) 10 MG tablet   atorvastatin (LIPITOR) 10 MG tablet   losartan-hydrochlorothiazide (HYZAAR) 100-25 MG tablet  Assessment and Plan Assessment & Plan Breast Cancer   She is scheduled for a lumpectomy on February 29, 2024, after breast cancer detection. Post-surgery, she will undergo three weeks of radiation therapy, five days a week, followed by five years of hormone therapy. No chemotherapy is planned. She has been fitted for compression garments for recovery. She opted for surgery over monitoring to alleviate recurrence fears.  Type 2 Diabetes Mellitus   She is currently on metformin and Trulicity, with plans to wean off Trulicity under another provider's care. Her metformin prescription has been refilled.  Hypertension   Her blood pressure is well-controlled with carvedilol, with no significant issues reported.  Vitamin D Deficiency   She is on vitamin D supplementation, and recent labs are satisfactory.  Allergic Rhinitis   Astelin nasal spray is effectively managing her symptoms, with no nasal congestion reported.    No follow-ups on file.    Yakir Wenke R Lowne Chase, DO

## 2024-02-16 ENCOUNTER — Telehealth: Payer: Self-pay | Admitting: Radiation Oncology

## 2024-02-16 NOTE — Telephone Encounter (Signed)
Called patient to schedule a consultation w. Dr. Squire. No answer, LVM for a return call.  

## 2024-02-18 ENCOUNTER — Encounter: Payer: Self-pay | Admitting: *Deleted

## 2024-02-18 ENCOUNTER — Telehealth: Payer: Self-pay | Admitting: *Deleted

## 2024-02-18 NOTE — Telephone Encounter (Signed)
 Spoke with patient to follow up from [facility] 4/9 and assess   navigation needs. Patient denies any questions or concerns at this time.

## 2024-02-18 NOTE — Progress Notes (Signed)
 Surgical Instructions   Your procedure is scheduled on Monday, April 28th. Report to [facility] Main Entrance A at 9:00 A.M., then check in with the Admitting office. Any questions or running late day of surgery: call [phone]  Questions prior to your surgery date: call [phone], Monday-Friday, 8am-4pm. If you experience any cold or flu symptoms such as cough, fever, chills, shortness of breath, etc. between now and your scheduled surgery, please notify us  at the above number.     Remember:  Do not eat after midnight the night before your surgery   You may drink clear liquids until 8:00 the morning of your surgery.   Clear liquids allowed are: Water, Non-Citrus Juices (without pulp), Carbonated Beverages, Clear Tea (no milk, honey, etc.), Black Coffee Only (NO MILK, CREAM OR POWDERED CREAMER of any kind), and Gatorade.    Take these medicines the morning of surgery with A SIP OF WATER  amLODipine  (NORVASC )  ARIPiprazole (ABILIFY)  atorvastatin  (LIPITOR)  carvedilol  (COREG )  fluticasone  (FLONASE )   Per your physician's instruction, HOLD your Dulaglutide  (TRULICITY ) for 7 day's prior to surgery.  Your last dose should be taken on or before Sunday, April 20th.  Per your physician's instruction, HOLD your METFORMIN  (GLUCOPHAGE ) the day of surgery.  Your last dose should be taken on Sunday, April 27th.    One week prior to surgery, STOP taking any Aspirin (unless otherwise instructed by your surgeon) Aleve, Naproxen, Ibuprofen, Motrin, Advil, Goody's, BC's, all herbal medications, fish oil, and non-prescription vitamins.                     Do NOT Smoke (Tobacco/Vaping) for 24 hours prior to your procedure.  If you use a CPAP at night, you may bring your mask/headgear for your overnight stay.   You will be asked to remove any contacts, glasses, piercing's, hearing aid's, dentures/partials prior to surgery. Please bring cases for these items if needed.    Patients  discharged the day of surgery will not be allowed to drive home, and someone needs to stay with them for 24 hours.  SURGICAL WAITING ROOM VISITATION Patients may have no more than 2 support people in the waiting area - these visitors may rotate.   Pre-op nurse will coordinate an appropriate time for 1 ADULT support person, who may not rotate, to accompany patient in pre-op.  Children under the age of 18 must have an adult with them who is not the patient and must remain in the main waiting area with an adult.  If the patient needs to stay at the hospital during part of their recovery, the visitor guidelines for inpatient rooms apply.  Please refer to the [facility] website for the visitor guidelines for any additional information.   If you received a COVID test during your pre-op visit  it is requested that you wear a mask when out in public, stay away from anyone that may not be feeling well and notify your surgeon if you develop symptoms. If you have been in contact with anyone that has tested positive in the last 10 days please notify you surgeon.      Pre-operative CHG Bathing Instructions   You can play a key role in reducing the risk of infection after surgery. Your skin needs to be as free of germs as possible. You can reduce the number of germs on your skin by washing with CHG (chlorhexidine  gluconate) soap before surgery. CHG is an antiseptic soap that kills germs  and continues to kill germs even after washing.   DO NOT use if you have an allergy to chlorhexidine /CHG or antibacterial soaps. If your skin becomes reddened or irritated, stop using the CHG and notify one of our RNs at [phone].              TAKE A SHOWER THE NIGHT BEFORE SURGERY AND THE DAY OF SURGERY    Please keep in mind the following:  DO NOT shave, including legs and underarms, 48 hours prior to surgery.   You may shave your face before/day of surgery.  Place clean sheets on your bed the night before  surgery Use a clean washcloth (not used since being washed) for each shower. DO NOT sleep with pet's night before surgery.  CHG Shower Instructions:  Wash your face and private area with normal soap. If you choose to wash your hair, wash first with your normal shampoo.  After you use shampoo/soap, rinse your hair and body thoroughly to remove shampoo/soap residue.  Turn the water OFF and apply half the bottle of CHG soap to a CLEAN washcloth.  Apply CHG soap ONLY FROM YOUR NECK DOWN TO YOUR TOES (washing for 3-5 minutes)  DO NOT use CHG soap on face, private areas, open wounds, or sores.  Pay special attention to the area where your surgery is being performed.  If you are having back surgery, having someone wash your back for you may be helpful. Wait 2 minutes after CHG soap is applied, then you may rinse off the CHG soap.  Pat dry with a clean towel  Put on clean pajamas    Additional instructions for the day of surgery: DO NOT APPLY any lotions, deodorants, cologne, or perfumes.   Do not wear jewelry or makeup Do not wear nail polish, gel polish, artificial nails, or any other type of covering on natural nails (fingers and toes) Do not bring valuables to the hospital. [facility] is not responsible for valuables/personal belongings. Put on clean/comfortable clothes.  Please brush your teeth.  Ask your nurse before applying any prescription medications to the skin.

## 2024-02-19 ENCOUNTER — Other Ambulatory Visit: Payer: Self-pay

## 2024-02-19 ENCOUNTER — Encounter (HOSPITAL_COMMUNITY)
Admission: RE | Admit: 2024-02-19 | Discharge: 2024-02-19 | Disposition: A | Discharge status: Home / Self Care | Source: Ambulatory Visit | Attending: General Surgery | Admitting: General Surgery

## 2024-02-19 ENCOUNTER — Encounter (HOSPITAL_COMMUNITY): Payer: Self-pay | Admitting: General Surgery

## 2024-02-19 DIAGNOSIS — I10 Essential (primary) hypertension: Secondary | ICD-10-CM | POA: Insufficient documentation

## 2024-02-19 DIAGNOSIS — Z01810 Encounter for preprocedural cardiovascular examination: Secondary | ICD-10-CM | POA: Insufficient documentation

## 2024-02-19 LAB — GLUCOSE, CAPILLARY: Glucose-Capillary: 68 mg/dL — ABNORMAL LOW (ref 70–99)

## 2024-02-19 NOTE — Pre-Procedure Instructions (Signed)
 Surgical Instructions     Your procedure is scheduled on Monday, April 28th.  Report to [facility] Main Entrance "A" at 9:00 A.M., then check in with the Admitting office. Any questions or running late day of surgery: call [phone]   Questions prior to your surgery date: call [phone], Monday-Friday, 8am-4pm. If you experience any cold or flu symptoms such as cough, fever, chills, shortness of breath, etc. between now and your scheduled surgery, please notify us  at the above number.            Remember:       Do not eat after midnight the night before your surgery     You may drink clear liquids until 8:00 the morning of your surgery.   Clear liquids allowed are: Water, Non-Citrus Juices (without pulp), Carbonated Beverages, Clear Tea (no milk, honey, etc.), Black Coffee Only (NO MILK, CREAM OR POWDERED CREAMER of any kind), and Gatorade.  Patient Instructions  The night before surgery:  No food after midnight. ONLY clear liquids after midnight  The day of surgery (if you do NOT have diabetes):  Drink ONE (1) Pre-Surgery Clear Ensure by 0800 the morning of surgery. Drink in one sitting. Do not sip.  This drink was given to you during your hospital  pre-op appointment visit.  Nothing else to drink after completing the  Pre-Surgery Clear Ensure.          If you have questions, please contact your surgeon's office.           Take these medicines the morning of surgery with A SIP OF WATER  amLODipine  (NORVASC )  ARIPiprazole (ABILIFY)  atorvastatin  (LIPITOR)  carvedilol  (COREG )  fluticasone  (FLONASE )    Per your physician's instruction, HOLD your Dulaglutide  (TRULICITY ) for 7 day's prior to surgery.  Your last dose should be taken on or before Sunday, April 20th.   Per your physician's instruction, HOLD your METFORMIN  (GLUCOPHAGE ) the day of surgery.  Your last dose should be taken on Sunday, April 27th.      One week prior to surgery, STOP taking any Aspirin  (unless otherwise instructed by your surgeon) Aleve, Naproxen, Ibuprofen, Motrin, Advil, Goody's, BC's, all herbal medications, fish oil, and non-prescription vitamins.                     Do NOT Smoke (Tobacco/Vaping) for 24 hours prior to your procedure.   If you use a CPAP at night, you may bring your mask/headgear for your overnight stay.   You will be asked to remove any contacts, glasses, piercing's, hearing aid's, dentures/partials prior to surgery. Please bring cases for these items if needed.    Patients discharged the day of surgery will not be allowed to drive home, and someone needs to stay with them for 24 hours.   SURGICAL WAITING ROOM VISITATION Patients may have no more than 2 support people in the waiting area - these visitors may rotate.   Pre-op nurse will coordinate an appropriate time for 1 ADULT support person, who may not rotate, to accompany patient in pre-op.  Children under the age of 68 must have an adult with them who is not the patient and must remain in the main waiting area with an adult.   If the patient needs to stay at the hospital during part of their recovery, the visitor guidelines for inpatient rooms apply.   Please refer to the [facility] website for the visitor guidelines for any additional information.  If you received a COVID test during your pre-op visit  it is requested that you wear a mask when out in public, stay away from anyone that may not be feeling well and notify your surgeon if you develop symptoms. If you have been in contact with anyone that has tested positive in the last 10 days please notify you surgeon.         Pre-operative CHG Bathing Instructions    You can play a key role in reducing the risk of infection after surgery. Your skin needs to be as free of germs as possible. You can reduce the number of germs on your skin by washing with CHG (chlorhexidine gluconate) soap before surgery. CHG is an antiseptic soap that kills  germs and continues to kill germs even after washing.    DO NOT use if you have an allergy to chlorhexidine/CHG or antibacterial soaps. If your skin becomes reddened or irritated, stop using the CHG and notify one of our RNs at [phone].               TAKE A SHOWER THE NIGHT BEFORE SURGERY AND THE DAY OF SURGERY     Please keep in mind the following:  DO NOT shave, including legs and underarms, 48 hours prior to surgery.   You may shave your face before/day of surgery.  Place clean sheets on your bed the night before surgery Use a clean washcloth (not used since being washed) for each shower. DO NOT sleep with pet's night before surgery.   CHG Shower Instructions:  Wash your face and private area with normal soap. If you choose to wash your hair, wash first with your normal shampoo.  After you use shampoo/soap, rinse your hair and body thoroughly to remove shampoo/soap residue.  Turn the water OFF and apply half the bottle of CHG soap to a CLEAN washcloth.  Apply CHG soap ONLY FROM YOUR NECK DOWN TO YOUR TOES (washing for 3-5 minutes)  DO NOT use CHG soap on face, private areas, open wounds, or sores.  Pay special attention to the area where your surgery is being performed.  If you are having back surgery, having someone wash your back for you may be helpful. Wait 2 minutes after CHG soap is applied, then you may rinse off the CHG soap.  Pat dry with a clean towel  Put on clean pajamas     Additional instructions for the day of surgery: DO NOT APPLY any lotions, deodorants, cologne, or perfumes.   Do not wear jewelry or makeup Do not wear nail polish, gel polish, artificial nails, or any other type of covering on natural nails (fingers and toes) Do not bring valuables to the hospital. [facility] is not responsible for valuables/personal belongings. Put on clean/comfortable clothes.  Please brush your teeth.  Ask your nurse before applying any prescription medications to the  skin.

## 2024-02-19 NOTE — Progress Notes (Signed)
 PCP -Antonio Cyndee Rockers, DO Cardiologist -   PPM/ICD - denies Device Orders - n/a Rep Notified - n/a  Chest x-ray - denies EKG - 02/19/2024 Stress Test - denies ECHO - 01/04/2015 Cardiac Cath - denies  Sleep Study - denies CPAP - n/a  Fasting Blood Sugar - 68 at PAT appt. Pt has prediabetes. Pt is given some soda; no symptoms. Pt does not check her CBG at home.   Last dose of GLP1 agonist-  Trulicity  - 02/21/2024 GLP1 instructions: hold for 7 days prior procedure  Blood Thinner Instructions: n/a Aspirin Instructions:n/a  ERAS Protcol -yes, till 0800 am PRE-SURGERY Ensure or G2- Ensure  COVID TEST- n/a   Anesthesia review: yes, seed placement; pt denies having heart murmur (it is in her chart.) States irregular heart rate about 10 years ago. Alicia Knapp, Alicia Knapp is aware. EKG is ordered for today.   Patient denies shortness of breath, fever, cough and chest pain at PAT appointment   All instructions explained to the patient, with a verbal understanding of the material. Patient agrees to go over the instructions while at home for a better understanding. Patient also instructed to self quarantine after being tested for COVID-19. The opportunity to ask questions was provided.

## 2024-02-26 ENCOUNTER — Ambulatory Visit
Admission: RE | Admit: 2024-02-26 | Discharge: 2024-02-26 | Disposition: A | Discharge status: Home / Self Care | Source: Ambulatory Visit | Attending: General Surgery | Admitting: General Surgery

## 2024-02-26 DIAGNOSIS — D0512 Intraductal carcinoma in situ of left breast: Secondary | ICD-10-CM

## 2024-02-26 HISTORY — PX: BREAST BIOPSY: SHX20

## 2024-02-29 ENCOUNTER — Encounter (HOSPITAL_COMMUNITY): Payer: Self-pay | Admitting: General Surgery

## 2024-02-29 ENCOUNTER — Ambulatory Visit
Admission: RE | Admit: 2024-02-29 | Discharge: 2024-02-29 | Disposition: A | Discharge status: Home / Self Care | Source: Ambulatory Visit | Attending: General Surgery | Admitting: General Surgery

## 2024-02-29 ENCOUNTER — Other Ambulatory Visit: Payer: Self-pay

## 2024-02-29 ENCOUNTER — Ambulatory Visit (HOSPITAL_COMMUNITY): Payer: Self-pay | Admitting: Vascular Surgery

## 2024-02-29 ENCOUNTER — Encounter (HOSPITAL_COMMUNITY)
Admission: RE | Disposition: A | Discharge status: Home / Self Care | Payer: Self-pay | Source: Home / Self Care | Attending: General Surgery

## 2024-02-29 ENCOUNTER — Ambulatory Visit (HOSPITAL_COMMUNITY)
Admission: RE | Admit: 2024-02-29 | Discharge: 2024-02-29 | Disposition: A | Discharge status: Home / Self Care | Attending: General Surgery | Admitting: General Surgery

## 2024-02-29 ENCOUNTER — Ambulatory Visit (HOSPITAL_BASED_OUTPATIENT_CLINIC_OR_DEPARTMENT_OTHER)

## 2024-02-29 DIAGNOSIS — Z79899 Other long term (current) drug therapy: Secondary | ICD-10-CM | POA: Insufficient documentation

## 2024-02-29 DIAGNOSIS — D0512 Intraductal carcinoma in situ of left breast: Secondary | ICD-10-CM

## 2024-02-29 DIAGNOSIS — R7303 Prediabetes: Secondary | ICD-10-CM | POA: Diagnosis not present

## 2024-02-29 DIAGNOSIS — Z7985 Long-term (current) use of injectable non-insulin antidiabetic drugs: Secondary | ICD-10-CM | POA: Insufficient documentation

## 2024-02-29 DIAGNOSIS — Z1721 Progesterone receptor positive status: Secondary | ICD-10-CM | POA: Insufficient documentation

## 2024-02-29 DIAGNOSIS — Z170 Estrogen receptor positive status [ER+]: Secondary | ICD-10-CM | POA: Insufficient documentation

## 2024-02-29 DIAGNOSIS — Z87891 Personal history of nicotine dependence: Secondary | ICD-10-CM | POA: Diagnosis not present

## 2024-02-29 DIAGNOSIS — C50912 Malignant neoplasm of unspecified site of left female breast: Secondary | ICD-10-CM | POA: Diagnosis not present

## 2024-02-29 DIAGNOSIS — Z7984 Long term (current) use of oral hypoglycemic drugs: Secondary | ICD-10-CM | POA: Diagnosis not present

## 2024-02-29 DIAGNOSIS — I10 Essential (primary) hypertension: Secondary | ICD-10-CM | POA: Insufficient documentation

## 2024-02-29 DIAGNOSIS — F419 Anxiety disorder, unspecified: Secondary | ICD-10-CM | POA: Diagnosis not present

## 2024-02-29 DIAGNOSIS — R921 Mammographic calcification found on diagnostic imaging of breast: Secondary | ICD-10-CM | POA: Diagnosis not present

## 2024-02-29 HISTORY — DX: Prediabetes: R73.03

## 2024-02-29 HISTORY — DX: Malignant (primary) neoplasm, unspecified: C80.1

## 2024-02-29 HISTORY — PX: BREAST LUMPECTOMY WITH RADIOACTIVE SEED LOCALIZATION: SHX6424

## 2024-02-29 LAB — GLUCOSE, CAPILLARY: Glucose-Capillary: 135 mg/dL — ABNORMAL HIGH (ref 70–99)

## 2024-02-29 SURGERY — BREAST LUMPECTOMY WITH RADIOACTIVE SEED LOCALIZATION
Anesthesia: General | Site: Breast | Laterality: Left

## 2024-02-29 MED ORDER — PROPOFOL 10 MG/ML IV BOLUS
INTRAVENOUS | Status: DC | PRN
  Administered 2024-02-29: 200 mg via INTRAVENOUS

## 2024-02-29 MED ORDER — BUPIVACAINE-EPINEPHRINE (PF) 0.25% -1:200000 IJ SOLN
INTRAMUSCULAR | Status: AC
  Filled 2024-02-29: qty 30

## 2024-02-29 MED ORDER — LIDOCAINE 2% (20 MG/ML) 5 ML SYRINGE
INTRAMUSCULAR | Status: DC | PRN
  Administered 2024-02-29: 60 mg via INTRAVENOUS

## 2024-02-29 MED ORDER — HYDROMORPHONE HCL 1 MG/ML IJ SOLN: 0.2500 mg | INTRAMUSCULAR | Status: DC | PRN

## 2024-02-29 MED ORDER — DEXAMETHASONE SODIUM PHOSPHATE 10 MG/ML IJ SOLN
INTRAMUSCULAR | Status: DC | PRN
  Administered 2024-02-29: 5 mg via INTRAVENOUS

## 2024-02-29 MED ORDER — CHLORHEXIDINE GLUCONATE 0.12 % MT SOLN
15.0000 mL | Freq: Once | OROMUCOSAL | Status: AC
  Administered 2024-02-29: 15 mL via OROMUCOSAL
  Filled 2024-02-29: qty 15

## 2024-02-29 MED ORDER — EPHEDRINE SULFATE-NACL 50-0.9 MG/10ML-% IV SOSY
PREFILLED_SYRINGE | INTRAVENOUS | Status: DC | PRN
Start: 2024-02-29 — End: 2024-02-29
  Administered 2024-02-29: 10 mg via INTRAVENOUS
  Administered 2024-02-29: 5 mg via INTRAVENOUS

## 2024-02-29 MED ORDER — OXYCODONE HCL 5 MG PO TABS: 5.0000 mg | ORAL_TABLET | Freq: Once | ORAL | Status: DC | PRN

## 2024-02-29 MED ORDER — PROPOFOL 10 MG/ML IV BOLUS
INTRAVENOUS | Status: AC
  Filled 2024-02-29: qty 20

## 2024-02-29 MED ORDER — OXYCODONE HCL 5 MG/5ML PO SOLN: 5.0000 mg | Freq: Once | ORAL | Status: DC | PRN

## 2024-02-29 MED ORDER — ACETAMINOPHEN 500 MG PO TABS
1000.0000 mg | ORAL_TABLET | ORAL | Status: AC
  Administered 2024-02-29: 1000 mg via ORAL
  Filled 2024-02-29: qty 2

## 2024-02-29 MED ORDER — BUPIVACAINE-EPINEPHRINE 0.25% -1:200000 IJ SOLN
INTRAMUSCULAR | Status: DC | PRN
  Administered 2024-02-29: 10 mL

## 2024-02-29 MED ORDER — CHLORHEXIDINE GLUCONATE CLOTH 2 % EX PADS: 6.0000 | MEDICATED_PAD | Freq: Once | CUTANEOUS | Status: DC

## 2024-02-29 MED ORDER — MIDAZOLAM HCL 2 MG/2ML IJ SOLN
INTRAMUSCULAR | Status: DC | PRN
  Administered 2024-02-29: 2 mg via INTRAVENOUS

## 2024-02-29 MED ORDER — PHENYLEPHRINE HCL (PRESSORS) 10 MG/ML IV SOLN
INTRAVENOUS | Status: DC | PRN
  Administered 2024-02-29 (×4): 80 ug via INTRAVENOUS

## 2024-02-29 MED ORDER — LACTATED RINGERS IV SOLN: INTRAVENOUS | Status: DC

## 2024-02-29 MED ORDER — ACETAMINOPHEN 650 MG RE SUPP: 650.0000 mg | RECTAL | Status: DC | PRN

## 2024-02-29 MED ORDER — SODIUM CHLORIDE 0.9 % IV SOLN: 250.0000 mL | INTRAVENOUS | Status: DC | PRN

## 2024-02-29 MED ORDER — ACETAMINOPHEN 325 MG PO TABS: 650.0000 mg | ORAL_TABLET | ORAL | Status: DC | PRN

## 2024-02-29 MED ORDER — SODIUM CHLORIDE 0.9 % IV SOLN: 12.5000 mg | INTRAVENOUS | Status: DC | PRN

## 2024-02-29 MED ORDER — OXYCODONE HCL 5 MG PO TABS: 5.0000 mg | ORAL_TABLET | ORAL | Status: DC | PRN

## 2024-02-29 MED ORDER — GLYCOPYRROLATE 0.2 MG/ML IJ SOLN
INTRAMUSCULAR | Status: DC | PRN
  Administered 2024-02-29: .2 mg via INTRAVENOUS

## 2024-02-29 MED ORDER — ENSURE PRE-SURGERY PO LIQD: 296.0000 mL | Freq: Once | ORAL | Status: DC

## 2024-02-29 MED ORDER — ONDANSETRON HCL 4 MG/2ML IJ SOLN
INTRAMUSCULAR | Status: DC | PRN
  Administered 2024-02-29: 4 mg via INTRAVENOUS

## 2024-02-29 MED ORDER — MEPERIDINE HCL 25 MG/ML IJ SOLN: 6.2500 mg | INTRAMUSCULAR | Status: DC | PRN

## 2024-02-29 MED ORDER — SODIUM CHLORIDE 0.9% FLUSH: 3.0000 mL | INTRAVENOUS | Status: DC | PRN

## 2024-02-29 MED ORDER — ORAL CARE MOUTH RINSE: 15.0000 mL | Freq: Once | OROMUCOSAL | Status: AC

## 2024-02-29 MED ORDER — 0.9 % SODIUM CHLORIDE (POUR BTL) OPTIME
TOPICAL | Status: DC | PRN
  Administered 2024-02-29: 1000 mL

## 2024-02-29 MED ORDER — SODIUM CHLORIDE 0.9% FLUSH: 3.0000 mL | Freq: Two times a day (BID) | INTRAVENOUS | Status: DC

## 2024-02-29 MED ORDER — AMISULPRIDE (ANTIEMETIC) 5 MG/2ML IV SOLN: 10.0000 mg | Freq: Once | INTRAVENOUS | Status: DC | PRN

## 2024-02-29 MED ORDER — MIDAZOLAM HCL 2 MG/2ML IJ SOLN
INTRAMUSCULAR | Status: AC
  Filled 2024-02-29: qty 2

## 2024-02-29 MED ORDER — FENTANYL CITRATE (PF) 250 MCG/5ML IJ SOLN
INTRAMUSCULAR | Status: AC
  Filled 2024-02-29: qty 5

## 2024-02-29 MED ORDER — FENTANYL CITRATE (PF) 250 MCG/5ML IJ SOLN
INTRAMUSCULAR | Status: DC | PRN
Start: 2024-02-29 — End: 2024-02-29
  Administered 2024-02-29: 50 ug via INTRAVENOUS

## 2024-02-29 MED ORDER — CEFAZOLIN SODIUM-DEXTROSE 2-4 GM/100ML-% IV SOLN
2.0000 g | INTRAVENOUS | Status: AC
  Administered 2024-02-29: 2 g via INTRAVENOUS
  Filled 2024-02-29: qty 100

## 2024-02-29 SURGICAL SUPPLY — 33 items
BAG COUNTER SPONGE SURGICOUNT (BAG) ×1 IMPLANT
BINDER BREAST LRG (GAUZE/BANDAGES/DRESSINGS) IMPLANT
BINDER BREAST XLRG (GAUZE/BANDAGES/DRESSINGS) IMPLANT
CANISTER SUCT 3000ML PPV (MISCELLANEOUS) ×1 IMPLANT
CHLORAPREP W/TINT 26 (MISCELLANEOUS) ×1 IMPLANT
CLIP APPLIE 9.375 MED OPEN (MISCELLANEOUS) IMPLANT
CLIP TI MEDIUM 6 (CLIP) IMPLANT
COVER PROBE W GEL 5X96 (DRAPES) ×1 IMPLANT
COVER SURGICAL LIGHT HANDLE (MISCELLANEOUS) ×1 IMPLANT
DERMABOND ADVANCED .7 DNX12 (GAUZE/BANDAGES/DRESSINGS) ×1 IMPLANT
DEVICE DUBIN SPECIMEN MAMMOGRA (MISCELLANEOUS) ×1 IMPLANT
DRAPE CHEST BREAST 15X10 FENES (DRAPES) ×1 IMPLANT
ELECT COATED BLADE 2.86 ST (ELECTRODE) ×1 IMPLANT
ELECTRODE REM PT RTRN 9FT ADLT (ELECTROSURGICAL) ×1 IMPLANT
GLOVE BIO SURGEON STRL SZ7 (GLOVE) ×2 IMPLANT
GLOVE BIOGEL PI IND STRL 7.5 (GLOVE) ×1 IMPLANT
GOWN STRL REUS W/ TWL LRG LVL3 (GOWN DISPOSABLE) ×2 IMPLANT
KIT BASIN OR (CUSTOM PROCEDURE TRAY) ×1 IMPLANT
KIT MARKER MARGIN INK (KITS) ×1 IMPLANT
LIGHT WAVEGUIDE WIDE FLAT (MISCELLANEOUS) IMPLANT
NDL HYPO 25GX1X1/2 BEV (NEEDLE) ×1 IMPLANT
NEEDLE HYPO 25GX1X1/2 BEV (NEEDLE) ×1 IMPLANT
NS IRRIG 1000ML POUR BTL (IV SOLUTION) ×1 IMPLANT
PACK GENERAL/GYN (CUSTOM PROCEDURE TRAY) ×1 IMPLANT
STRIP CLOSURE SKIN 1/2X4 (GAUZE/BANDAGES/DRESSINGS) ×1 IMPLANT
SUT MNCRL AB 4-0 PS2 18 (SUTURE) ×1 IMPLANT
SUT MON AB 5-0 PS2 18 (SUTURE) IMPLANT
SUT SILK 2 0 SH (SUTURE) IMPLANT
SUT VIC AB 2-0 SH 27XBRD (SUTURE) ×1 IMPLANT
SUT VIC AB 3-0 SH 27X BRD (SUTURE) ×1 IMPLANT
SYR CONTROL 10ML LL (SYRINGE) ×1 IMPLANT
TOWEL GREEN STERILE (TOWEL DISPOSABLE) ×1 IMPLANT
TOWEL GREEN STERILE FF (TOWEL DISPOSABLE) ×1 IMPLANT

## 2024-02-29 NOTE — Transfer of Care (Signed)
 Immediate Anesthesia Transfer of Care Note  Patient: Alicia Knapp  Procedure(s) Performed: BREAST LUMPECTOMY WITH RADIOACTIVE SEED LOCALIZATION (Left: Breast)  Patient Location: PACU  Anesthesia Type:General  Level of Consciousness: awake and drowsy  Airway & Oxygen Therapy: Patient Spontanous Breathing and Patient connected to face mask oxygen  Post-op Assessment: Report given to RN and Post -op Vital signs reviewed and stable  Post vital signs: Reviewed and stable  Last Vitals:  Vitals Value Taken Time  BP 123/71 02/29/24 1143  Temp    Pulse 66 02/29/24 1143  Resp 15 02/29/24 1143  SpO2 100 % 02/29/24 1143    Last Pain:  Vitals:   02/29/24 0930  TempSrc:   PainSc: 0-No pain         Complications: No notable events documented.

## 2024-02-29 NOTE — Op Note (Signed)
  Preoperative diagnosis: Left breast cancer, clinical stage 0 Postoperative diagnosis: Same as above Procedure:Left breast seed guided lumpectomy Surgeon: Dr. Donavan Fuchs Anesthesia: General Estimated blood loss: Minimal Complications: None Drains: None Specimens: Right breast mass containing seed and clip marked with paint, additional medial margin marked short superior, long lateral, double deep Disposition to recovery stable addition   Indications: 68 of with screening mm calcifications. She has c density breasts. She has 1.9 cm of calcifications in the LIQ. 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 No mass or dc. No prior breast history  We discussed lumpectomy   Procedure: After informed consent was obtained she was taken to the operating room.  She had had the seed placed prior to this and I had these mammograms available for my review.  She had SCDs in place.  She was given antibiotics.  She was placed under general anesthesia without complication.  She was prepped and draped in standard sterile surgical fashion.  A surgical timeout was then performed.   I infiltrated Marcaine and then made a curvlinear incision over the seed in the LUIQ due to its proximity to the skin.   I then removed the seed and surrounding tissue to get a clear margin.  I removed this and passed it off the table after marking it with paint.  A mammogram confirmed removal of the seed and the clip.  I removed  additional margin medially and posteriorly that looked close on the 3D imaging. I then obtained hemostasis.  I closed the breast tissue with 2-0 Vicryl.  The skin was closed with 3-0 Vicryl and 4-0 Monocryl.  Glue and Steri-Strips were applied.  She tolerated this well was transferred recovery stable.

## 2024-02-29 NOTE — H&P (Signed)
 68 of with screening mm calcifications. She has c density breasts. She has 1.9 cm of calcifications in the LIQ. 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 She is retired. No mass or dc. No prior breast history  Review of Systems: A complete review of systems was obtained from the patient. I have reviewed this information and discussed as appropriate with the patient. See HPI as well for other ROS.  Review of Systems  All other systems reviewed and are negative.  Medical History: Past Medical History:  Diagnosis Date  Anxiety  History of cancer  Hyperlipidemia  Hypertension   Patient Active Problem List  Diagnosis  Ductal carcinoma in situ (DCIS) of left breast  Essential hypertension  Hyperlipidemia   Past Surgical History:  Procedure Laterality Date  .Left Breast Biopsy Left 01/29/2024  APPENDECTOMY  HYSTERECTOMY   Allergies  Allergen Reactions  Erythromycin Other (See Comments)  Causes stomach cramps  Penicillins Other (See Comments)  rash   Current Outpatient Medications on File Prior to Visit  Medication Sig Dispense Refill  amLODIPine  (NORVASC ) 10 MG tablet Take 1 tablet by mouth once daily  ARIPiprazole (ABILIFY) 5 MG tablet Take 5 mg by mouth once daily TAKE ONE TABLET (5 MG) BY MOUTH DAILY (IN ADDITION TO 2MG  FOR A TOTAL OF 7MG  DAILY).  atorvastatin  (LIPITOR) 10 MG tablet Take 1 tablet by mouth once daily  carvediloL  (COREG ) 25 MG tablet Take 25 mg by mouth 2 (two) times daily with meals  ergocalciferol , vitamin D2, 1,250 mcg (50,000 unit) capsule Take 50,000 Units by mouth every 7 (seven) days  levocetirizine (XYZAL ) 5 MG tablet Take 5 mg by mouth every evening  losartan -hydroCHLOROthiazide  (HYZAAR) 100-25 mg tablet Take 1 tablet by mouth once daily  metFORMIN  (GLUCOPHAGE ) 500 MG tablet Take 500 mg by mouth daily with breakfast  TRULICITY  3 mg/0.5 mL subcutaneous pen injector Inject 3 mg subcutaneously once a week   Family History   Problem Relation Age of Onset  Diabetes Mother  Stroke Brother    Social History   Tobacco Use  Smoking Status Former  Types: Cigarettes  Smokeless Tobacco Never  Tobacco Comments  Quit smoking cigarettes 09/17/2014  Marital status: Married  Tobacco Use  Smoking status: Former  Types: Cigarettes  Smokeless tobacco: Never  Tobacco comments:  Quit smoking cigarettes 09/17/2014  Vaping Use  Vaping status: Never Used  Substance and Sexual Activity  Alcohol use: Not Currently  Drug use: Never   Objective:   Physical Exam Vitals reviewed.  Constitutional:  Appearance: Normal appearance.  Chest:  Breasts: Right: No inverted nipple, mass or nipple discharge.  Left: No inverted nipple, mass or nipple discharge.  Lymphadenopathy:  Upper Body:  Right upper body: No supraclavicular or axillary adenopathy.  Left upper body: No supraclavicular or axillary adenopathy.  Neurological:  Mental Status: She is alert.   Assessment and Plan:   DCIS left  Left breast seed guided lumpectomy  We discussed the staging and pathophysiology of breast cancer. We discussed all of the different options for treatment for breast cancer including surgery, radiation therapy,and antiestrogen therapy.  We discussed a sentinel lymph node biopsy but she does not need to do this due to dcis.   We discussed the options for treatment of the breast cancer which included lumpectomy versus a mastectomy. We discussed the performance of the lumpectomy with radioactive seed placement. We discussed a 5-10% chance of a positive margin requiring reexcision in the operating room. We  also discussed that she will likely need radiation therapy if she undergoes lumpectomy. We discussed mastectomy and the postoperative care for that as well. Mastectomy can be followed by reconstruction. Most mastectomy patients will not need radiation therapy. We discussed that there is no difference in her survival whether she  undergoes lumpectomy with radiation therapy or antiestrogen therapy versus a mastectomy. There is also no real difference between her recurrence in the breast.  We discussed the risks of operation including bleeding, infection, possible reoperation. She understands her further therapy will be based on what her stages at the time of her operation.

## 2024-02-29 NOTE — Discharge Instructions (Addendum)
 [facility] Office Phone Number [phone]  POST OP INSTRUCTIONS Take 400 mg of ibuprofen every 8 hours or 650 mg tylenol every 6 hours for next 72 hours then as needed. Use ice several times daily also.  A prescription for pain medication may be given to you upon discharge.  Take your pain medication as prescribed, if needed.  If narcotic pain medicine is not needed, then you may take acetaminophen (Tylenol), naprosyn (Alleve) or ibuprofen (Advil) as needed. Take your usually prescribed medications unless otherwise directed If you need a refill on your pain medication, please contact your pharmacy.  They will contact our office to request authorization.  Prescriptions will not be filled after 5pm or on week-ends. You should eat very light the first 24 hours after surgery, such as soup, crackers, pudding, etc.  Resume your normal diet the day after surgery. Most patients will experience some swelling and bruising in the breast.  Ice packs and a good support bra will help.  Wear the breast binder provided or a sports bra for 72 hours day and night.  After that wear a sports bra during the day until you return to the office. Swelling and bruising can take several days to resolve.  It is common to experience some constipation if taking pain medication after surgery.  Increasing fluid intake and taking a stool softener will usually help or prevent this problem from occurring.  A mild laxative (Milk of Magnesia or Miralax) should be taken according to package directions if there are no bowel movements after 48 hours. I used skin glue on the incision, you may shower in 24 hours.  The glue will flake off over the next 2-3 weeks.  Any sutures or staples will be removed at the office during your follow-up visit. ACTIVITIES:  You may resume regular daily activities (gradually increasing) beginning the next day.  Wearing a good support bra or sports bra minimizes pain and swelling.  You may have  sexual intercourse when it is comfortable. You may drive when you no longer are taking prescription pain medication, you can comfortably wear a seatbelt, and you can safely maneuver your car and apply brakes. RETURN TO WORK:  ______________________________________________________________________________________ Alicia Knapp should see your doctor in the office for a follow-up appointment approximately two weeks after your surgery.  Your doctor's nurse will typically make your follow-up appointment when she calls you with your pathology report.  Expect your pathology report 3-4 business days after your surgery.  You may call to check if you do not hear from [name] after three days. OTHER INSTRUCTIONS: _______________________________________________________________________________________________ _____________________________________________________________________________________________________________________________________ _____________________________________________________________________________________________________________________________________ _____________________________________________________________________________________________________________________________________  WHEN TO CALL DR Alicia Knapp: Fever over 101.0 Nausea and/or vomiting. Extreme swelling or bruising. Continued bleeding from incision. Increased pain, redness, or drainage from the incision.  The clinic staff is available to answer your questions during regular business hours.  Please don't hesitate to call and ask to speak to one of the nurses for clinical concerns.  If you have a medical emergency, go to the nearest emergency room or call 911.  A surgeon from [facility] is always on call at the hospital.  For further questions, please visit [email or web address]

## 2024-02-29 NOTE — Anesthesia Preprocedure Evaluation (Signed)
 Anesthesia Evaluation  Patient identified by MRN, date of birth, ID band Patient awake    Reviewed: Allergy & Precautions, H&P , NPO status , Patient's Chart, lab work & pertinent test results  History of Anesthesia Complications (+) PONV and history of anesthetic complications  Airway Mallampati: II  TM Distance: >3 FB Neck ROM: Full    Dental no notable dental hx.    Pulmonary neg pulmonary ROS, former smoker   Pulmonary exam normal breath sounds clear to auscultation       Cardiovascular hypertension, Pt. on medications negative cardio ROS Normal cardiovascular exam Rhythm:Regular Rate:Normal     Neuro/Psych   Anxiety     negative neurological ROS  negative psych ROS   GI/Hepatic negative GI ROS, Neg liver ROS,,,  Endo/Other  negative endocrine ROS    Renal/GU negative Renal ROS  negative genitourinary   Musculoskeletal negative musculoskeletal ROS (+)    Abdominal  (+) + obese  Peds negative pediatric ROS (+)  Hematology negative hematology ROS (+)   Anesthesia Other Findings DCIS  Reproductive/Obstetrics negative OB ROS                             Anesthesia Physical Anesthesia Plan  ASA: 3  Anesthesia Plan: General   Post-op Pain Management: Tylenol PO (pre-op)*   Induction: Intravenous  PONV Risk Score and Plan: 4 or greater and Ondansetron, Dexamethasone, Midazolam, Droperidol and Treatment may vary due to age or medical condition  Airway Management Planned: LMA  Additional Equipment:   Intra-op Plan:   Post-operative Plan: Extubation in OR  Informed Consent: I have reviewed the patients History and Physical, chart, labs and discussed the procedure including the risks, benefits and alternatives for the proposed anesthesia with the patient or authorized representative who has indicated his/her understanding and acceptance.     Dental advisory given  Plan  Discussed with: CRNA  Anesthesia Plan Comments:        Anesthesia Quick Evaluation

## 2024-02-29 NOTE — Anesthesia Procedure Notes (Signed)
 Procedure Name: LMA Insertion Date/Time: 02/29/2024 10:48 AM  Performed by: Oleva Koo, CRNAPre-anesthesia Checklist: Patient identified, Emergency Drugs available, Suction available and Patient being monitored Patient Re-evaluated:Patient Re-evaluated prior to induction Oxygen Delivery Method: Circle System Utilized Preoxygenation: Pre-oxygenation with 100% oxygen Induction Type: IV induction Ventilation: Mask ventilation without difficulty LMA: LMA inserted LMA Size: 4.0 Number of attempts: 1 Airway Equipment and Method: Bite block Placement Confirmation: positive ETCO2 Tube secured with: Tape Dental Injury: Teeth and Oropharynx as per pre-operative assessment  Comments: Teeth, lips and tongue unchanged. Easy LMA placement.

## 2024-02-29 NOTE — Interval H&P Note (Signed)
 History and Physical Interval Note:  02/29/2024 10:04 AM  Alicia Knapp  has presented today for surgery, with the diagnosis of LEFT BREAST DCIS.  The various methods of treatment have been discussed with the patient and family. After consideration of risks, benefits and other options for treatment, the patient has consented to  Procedure(s) with comments: BREAST LUMPECTOMY WITH RADIOACTIVE SEED LOCALIZATION (Left) - LEFT BREAST SEED GUIDED LUMPECTOMY as a surgical intervention.  The patient's history has been reviewed, patient examined, no change in status, stable for surgery.  I have reviewed the patient's chart and labs.  Questions were answered to the patient's satisfaction.     Enid Harry

## 2024-02-29 NOTE — Anesthesia Postprocedure Evaluation (Signed)
 Anesthesia Post Note  Patient: Alicia Knapp  Procedure(s) Performed: BREAST LUMPECTOMY WITH RADIOACTIVE SEED LOCALIZATION (Left: Breast)     Patient location during evaluation: PACU Anesthesia Type: General Level of consciousness: awake and alert Pain management: pain level controlled Vital Signs Assessment: post-procedure vital signs reviewed and stable Respiratory status: spontaneous breathing, nonlabored ventilation and respiratory function stable Cardiovascular status: blood pressure returned to baseline and stable Postop Assessment: no apparent nausea or vomiting Anesthetic complications: no   No notable events documented.  Last Vitals:  Vitals:   02/29/24 1143 02/29/24 1158  BP: 123/71 (P) 133/81  Pulse: 66 (P) 62  Resp: 15 (P) 14  Temp: (!) 36.4 C   SpO2: 100% (P) 99%    Last Pain:  Vitals:   02/29/24 1158  TempSrc:   PainSc: (P) 0-No pain                 Earvin Goldberg

## 2024-03-01 ENCOUNTER — Encounter (HOSPITAL_COMMUNITY): Payer: Self-pay | Admitting: General Surgery

## 2024-03-01 ENCOUNTER — Telehealth (INDEPENDENT_AMBULATORY_CARE_PROVIDER_SITE_OTHER): Admitting: Family Medicine

## 2024-03-01 VITALS — Ht 66.0 in

## 2024-03-01 DIAGNOSIS — Z6834 Body mass index (BMI) 34.0-34.9, adult: Secondary | ICD-10-CM

## 2024-03-01 DIAGNOSIS — R739 Hyperglycemia, unspecified: Secondary | ICD-10-CM | POA: Diagnosis not present

## 2024-03-01 DIAGNOSIS — D0512 Intraductal carcinoma in situ of left breast: Secondary | ICD-10-CM | POA: Diagnosis not present

## 2024-03-01 DIAGNOSIS — E66811 Obesity, class 1: Secondary | ICD-10-CM | POA: Diagnosis not present

## 2024-03-01 DIAGNOSIS — Z6832 Body mass index (BMI) 32.0-32.9, adult: Secondary | ICD-10-CM | POA: Diagnosis not present

## 2024-03-01 MED ORDER — OZEMPIC (0.25 OR 0.5 MG/DOSE) 2 MG/3ML ~~LOC~~ SOPN: 0.2500 mg | PEN_INJECTOR | SUBCUTANEOUS | 0 refills | Status: DC

## 2024-03-01 NOTE — Assessment & Plan Note (Signed)
 Patient is s/p lumpectomy yesterday by Dr. [name].  Pathology is pending- results should be coming in the next three days.  F/u on results at next appointment.

## 2024-03-01 NOTE — Progress Notes (Signed)
 TeleHealth Visit:   Alicia Knapp has verbally consented to this TeleHealth visit. The patient is located at home, the provider is located at the [facility] office. The participants in this visit include the listed provider and patient and husband. The visit was conducted today via MyChart.    Chief Complaint: OBESITY Alicia Knapp is here to discuss her progress with her obesity treatment plan along with follow-up of her obesity related diagnoses. Alicia Knapp is on the Category 3 Plan and states she is following her eating plan approximately  70% of the time. Alicia Knapp states she is walking 14 laps at the track = 1.4 miles for6 times per week.  Vitals Temp: (!) 97.5 F (36.4 C) BP: 123/71 Pulse Rate: 66 SpO2: 100 %   Anthropometric Measurements Height: 5\' 6"  (1.676 m) Weight: 200 lb (90.7 kg) BMI (Calculated): 32.3 Weight at Last Visit: 196 lb Starting Weight: 212 lb   No data recorded Other Clinical Data Today's Visit #: [id number] Starting Date: 08/03/18 Comments: virtual - Cat 3    Reported Weight:200  Subjective:  Patient has had a medically filled last few weeks- found to have ductal carcinoma in situ after a core biopsy due to calcifications seen on mammogram.  She has been eating meals on plan but snacking more.  This is not due to hunger but nervousness.  She is feeling better now that the lumpectomy is over. There are no diagnoses linked to this encounter. Assessment/Plan:   Problem List Items Addressed This Visit       Other   Elevated random blood glucose level   Patient has been on trulicity  but no longer feels it is helping with carbohydrate intake control.  She is not experiencing any GI side effects of this medication.  Will stop trulicity  and start Ozempic  to see if she get attain better carb intake control with alternative GLP1.  Starting at 0.25mg  weekly.      Relevant Medications   Semaglutide ,0.25 or 0.5MG /DOS, (OZEMPIC , 0.25 OR 0.5 MG/DOSE,) 2 MG/3ML SOPN    Class 1 obesity with serious comorbidity and body mass index (BMI) of 34.0 to 34.9 in adult   Relevant Medications   Semaglutide ,0.25 or 0.5MG /DOS, (OZEMPIC , 0.25 OR 0.5 MG/DOSE,) 2 MG/3ML SOPN   Ductal carcinoma in situ (DCIS) of left breast - Primary   Patient is s/p lumpectomy yesterday by Dr. [name].  Pathology is pending- results should be coming in the next three days.  F/u on results at next appointment.      Other Visit Diagnoses       BMI 32.0-32.9,adult           There are no diagnoses linked to this encounter.  Alicia Knapp is currently in the action stage of change. As such, her goal is to continue with weight loss efforts and has agreed to the Category 3 Plan.   Exercise goals: Older adults should determine their level of effort for physical activity relative to their level of fitness.   Behavioral modification strategies: increasing lean protein intake, decreasing simple carbohydrates, meal planning and cooking strategies, emotional eating strategies, and planning for success.  Alicia Knapp has agreed to follow-up with our clinic in 4 weeks. She was informed of the importance of frequent follow-up visits to maximize her success with intensive lifestyle modifications for her multiple health conditions.   Objective:   VITALS: Per patient if applicable, see vitals. GENERAL: Alert and in no acute distress. CARDIOPULMONARY: No increased WOB. Speaking in clear sentences.  PSYCH:  Pleasant and cooperative. Speech normal rate and rhythm. Affect is appropriate. Insight and judgement are appropriate. Attention is focused, linear, and appropriate.  NEURO: Oriented as arrived to appointment on time with no prompting.   Lab Results  Component Value Date   CREATININE 0.72 02/10/2024   BUN 17 02/10/2024   NA 139 02/10/2024   K 4.5 02/10/2024   CL 102 02/10/2024   CO2 31 02/10/2024   Lab Results  Component Value Date   ALT 19 02/10/2024   AST 14 (L) 02/10/2024   ALKPHOS 76 02/10/2024    BILITOT 0.5 02/10/2024   Lab Results  Component Value Date   HGBA1C 5.4 12/17/2023   HGBA1C 5.5 01/21/2023   HGBA1C 5.5 07/22/2022   HGBA1C 5.5 04/01/2022   HGBA1C 5.5 10/01/2021   Lab Results  Component Value Date   INSULIN  15.2 12/17/2023   INSULIN  16.1 01/21/2023   INSULIN  19.3 07/22/2022   INSULIN  11.2 12/25/2020   INSULIN  20.6 08/08/2020   Lab Results  Component Value Date   TSH 1.50 08/17/2023   Lab Results  Component Value Date   CHOL 134 12/17/2023   HDL 52 12/17/2023   LDLCALC 69 12/17/2023   LDLDIRECT 140.5 08/12/2013   TRIG 61 12/17/2023   CHOLHDL 3 08/17/2023   Lab Results  Component Value Date   VD25OH 77.1 12/17/2023   VD25OH 31.74 08/17/2023   VD25OH 90.7 01/21/2023   Lab Results  Component Value Date   WBC 4.3 02/10/2024   HGB 12.5 02/10/2024   HCT 37.8 02/10/2024   MCV 87.1 02/10/2024   PLT 376 02/10/2024   No results found for: "IRON", "TIBC", "FERRITIN"  Attestation Statements:   Reviewed by clinician on day of visit: allergies, medications, problem list, medical history, surgical history, family history, social history, and previous encounter notes.    Donaciano Frizzle, MD

## 2024-03-01 NOTE — Assessment & Plan Note (Signed)
 Patient has been on trulicity  but no longer feels it is helping with carbohydrate intake control.  She is not experiencing any GI side effects of this medication.  Will stop trulicity  and start Ozempic  to see if she get attain better carb intake control with alternative GLP1.  Starting at 0.25mg  weekly.

## 2024-03-02 LAB — SURGICAL PATHOLOGY

## 2024-03-03 ENCOUNTER — Encounter: Payer: Self-pay | Admitting: *Deleted

## 2024-03-08 ENCOUNTER — Telehealth (INDEPENDENT_AMBULATORY_CARE_PROVIDER_SITE_OTHER): Payer: Self-pay

## 2024-03-08 ENCOUNTER — Other Ambulatory Visit: Payer: Self-pay | Admitting: Family Medicine

## 2024-03-08 NOTE — Telephone Encounter (Signed)
 PA Case: [id number]  Request for Ozempic  Pen injctr. Request was denied as coverage is only for type 2 diabetes mellitus.

## 2024-03-21 DIAGNOSIS — D0512 Intraductal carcinoma in situ of left breast: Secondary | ICD-10-CM | POA: Diagnosis not present

## 2024-03-22 NOTE — Progress Notes (Signed)
 Location of Breast Cancer: Ductal Carcinoma In Situ of Left Breast   Histology per Pathology Report:    Receptor Status: ER(100% Positive), PR (80% Positive), Her2-neu (), Ki-67()  Did patient present with symptoms (if so, please note symptoms) or was this found on screening mammography?:  Mammogram   Past/Anticipated interventions by surgeon, if any: 02/29/2024 [name], MD Left Breast Seed Guided Lumpectomy Past/Anticipated interventions by medical oncology, if any:  02/10/2024 [place], MD  Lymphedema issues, if any:  no    Skin issues: Patient reports left breast feels hard.  Pain issues, if any:  no    SAFETY ISSUES: Prior radiation? no Pacemaker/ICD? no Possible current pregnancy?no Is the patient on methotrexate? no  Current Complaints / other details:    BP 139/72 (BP Location: Left Arm, Patient Position: Sitting, Cuff Size: Normal)   Pulse 63   Temp (!) 97.3 F (36.3 C)   Resp 18   Ht 5\' 6"  (1.676 m)   Wt 205 lb 12.8 oz (93.4 kg)   SpO2 98%   BMI 33.22 kg/m

## 2024-04-02 NOTE — Progress Notes (Incomplete)
 Radiation Oncology         [phone] ________________________________  Name: Alicia Knapp MRN: [id number]  Date: 04/04/2024  DOB: 05-Jan-1956  Follow-Up Visit Note  Outpatient  CC: Alicia Hemming, DO  [name], MD  Diagnosis:   No diagnosis found.   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     Since her breast clinic consultation date of 02/10/24, she opted to proceed with a left breast lumpectomy without nodal biopsies on 02/29/24 under the care of Dr. [name]. Pathology from the procedure revealed: tumor size of 2 mm; histology of intermediate grade DCIS with calcifications; all margins negative for in situ carcinoma; margin status to in situ disease of 5 mm from the posterior margin; no lymph nodes were examined;  ER status: 100% positive with strong staining intensity; PR status 80% positive with strong staining intensity, Her2 not assessed.  Based on her discussion with Dr. [name] on her breast clinic consultation date, she has opted to proceed with antiestrogen therapy consisting of tamoxifen.   No other significant interval history since her initial consultation date.   ***  Symptomatically, the patient reports: ***        ALLERGIES:  is allergic to erythromycin and penicillins.  Meds: Current Outpatient Medications  Medication Sig Dispense Refill   amLODipine  (NORVASC ) 10 MG tablet Take 1 tablet (10 mg total) by mouth daily. 90 tablet 1   ARIPiprazole (ABILIFY) 2 MG tablet Take 2 mg by mouth daily.      ARIPiprazole (ABILIFY) 5 MG tablet Take 5 mg by mouth daily.  0   atorvastatin  (LIPITOR) 10 MG tablet Take 1 tablet (10 mg total) by mouth daily. 90 tablet 3   azelastine  (ASTELIN ) 0.1 % nasal spray Place 1 spray into both  nostrils 2 (two) times daily. Use in each nostril as directed 90 mL 3   carvedilol  (COREG ) 25 MG tablet Take 1 tablet (25 mg total) by mouth 2 (two) times daily with a meal. 180 tablet 3   cetirizine (ZYRTEC) 10 MG tablet Take 10 mg by mouth at bedtime.     fluticasone  (FLONASE ) 50 MCG/ACT nasal spray Place 2 sprays into both nostrils daily. 48 g 3   losartan -hydrochlorothiazide  (HYZAAR) 100-25 MG tablet Take 1 tablet by mouth daily. 90 tablet 3   metFORMIN  (GLUCOPHAGE ) 500 MG tablet Take 1 tablet (500 mg total) by mouth daily with breakfast. 90 tablet 3   Multiple Vitamins-Minerals (MULTIVITAMIN WITH MINERALS) tablet Take 1 tablet by mouth daily.     Semaglutide ,0.25 or 0.5MG /DOS, (OZEMPIC , 0.25 OR 0.5 MG/DOSE,) 2 MG/3ML SOPN Inject 0.25 mg into the skin once a week. 3 mL 0   Vitamin D , Ergocalciferol , (DRISDOL ) 1.25 MG (50000 UNIT) CAPS capsule Take 1 capsule (50,000 Units total) by mouth every 7 (seven) days. 12 capsule 0   No current facility-administered medications for this encounter.    Physical Findings:  vitals were not taken for this visit. .     General: Alert and oriented, in no acute distress HEENT: Head is normocephalic. Extraocular movements are intact. Oropharynx is clear. Neck: Neck is supple, no palpable cervical or supraclavicular lymphadenopathy. Heart: Regular in rate and rhythm with no murmurs, rubs, or gallops. Chest: Clear to auscultation bilaterally, with no  rhonchi, wheezes, or rales. Abdomen: Soft, nontender, nondistended, with no rigidity or guarding. Extremities: No cyanosis or edema. Lymphatics: see Neck Exam Musculoskeletal: symmetric strength and muscle tone throughout. Neurologic: No obvious focalities. Speech is fluent.  Psychiatric: Judgment and insight are intact. Affect is appropriate. Breast exam reveals ***  Lab Findings: Lab Results  Component Value Date   WBC 4.3 02/10/2024   HGB 12.5 02/10/2024   HCT 37.8 02/10/2024   MCV 87.1 02/10/2024    PLT 376 02/10/2024    @LASTCHEMISTRY @  Radiographic Findings: No results found.  Impression/Plan: We discussed adjuvant radiotherapy today.  I recommend *** in order to ***.  I reviewed the logistics, benefits, risks, and potential side effects of this treatment in detail. Risks may include but not necessary be limited to acute and late injury tissue in the radiation fields such as skin irritation (change in color/pigmentation, itching, dryness, pain, peeling). She may experience fatigue. We also discussed possible risk of long term cosmetic changes or scar tissue. There is also a smaller risk for lung toxicity, ***cardiac toxicity, ***brachial plexopathy, ***lymphedema, ***musculoskeletal changes, ***rib fragility or ***induction of a second malignancy, ***late chronic non-healing soft tissue wound.    The patient asked good questions which I answered to her satisfaction. She is enthusiastic about proceeding with treatment. A consent form has been *** signed and placed in her chart.  A total of *** medically necessary complex treatment devices will be fabricated and supervised by me: *** fields with MLCs for custom blocks to protect heart, and lungs;  and, a Vac-lok. MORE COMPLEX DEVICES MAY BE MADE IN DOSIMETRY FOR FIELD IN FIELD BEAMS FOR DOSE HOMOGENEITY.  I have requested : 3D Simulation which is medically necessary to give adequate dose to at risk tissues while sparing lungs and heart.  I have requested a DVH of the following structures: lungs, heart, *** lumpectomy cavity.    The patient will receive *** Gy in *** fractions to the *** with *** fields.  This will be *** followed by a boost.  On date of service, in total, I spent *** minutes on this encounter. Patient was seen in person.  _____________________________________   Colie Dawes, MD  This document serves as a record of services personally performed by Colie Dawes, MD. It was created on her behalf by Aleta Anda, a trained  medical scribe. The creation of this record is based on the scribe's personal observations and the provider's statements to them. This document has been checked and approved by the attending provider.

## 2024-04-04 ENCOUNTER — Ambulatory Visit
Admission: RE | Admit: 2024-04-04 | Discharge: 2024-04-04 | Disposition: A | Discharge status: Home / Self Care | Source: Ambulatory Visit | Attending: Radiation Oncology | Admitting: Radiation Oncology

## 2024-04-04 ENCOUNTER — Encounter: Payer: Self-pay | Admitting: Radiation Oncology

## 2024-04-04 VITALS — BP 139/72 | HR 63 | Temp 97.3°F | Resp 18 | Ht 66.0 in | Wt 205.8 lb

## 2024-04-04 DIAGNOSIS — Z79899 Other long term (current) drug therapy: Secondary | ICD-10-CM | POA: Insufficient documentation

## 2024-04-04 DIAGNOSIS — D0512 Intraductal carcinoma in situ of left breast: Secondary | ICD-10-CM | POA: Insufficient documentation

## 2024-04-04 DIAGNOSIS — Z510 Encounter for antineoplastic radiation therapy: Secondary | ICD-10-CM | POA: Diagnosis not present

## 2024-04-04 DIAGNOSIS — Z7984 Long term (current) use of oral hypoglycemic drugs: Secondary | ICD-10-CM | POA: Insufficient documentation

## 2024-04-04 DIAGNOSIS — Z170 Estrogen receptor positive status [ER+]: Secondary | ICD-10-CM | POA: Insufficient documentation

## 2024-04-04 DIAGNOSIS — Z7985 Long-term (current) use of injectable non-insulin antidiabetic drugs: Secondary | ICD-10-CM | POA: Insufficient documentation

## 2024-04-04 DIAGNOSIS — Z923 Personal history of irradiation: Secondary | ICD-10-CM | POA: Insufficient documentation

## 2024-04-06 DIAGNOSIS — F3189 Other bipolar disorder: Secondary | ICD-10-CM | POA: Diagnosis not present

## 2024-04-07 ENCOUNTER — Ambulatory Visit

## 2024-04-07 DIAGNOSIS — H52203 Unspecified astigmatism, bilateral: Secondary | ICD-10-CM | POA: Diagnosis not present

## 2024-04-07 DIAGNOSIS — H25013 Cortical age-related cataract, bilateral: Secondary | ICD-10-CM | POA: Diagnosis not present

## 2024-04-07 DIAGNOSIS — R7303 Prediabetes: Secondary | ICD-10-CM | POA: Diagnosis not present

## 2024-04-07 DIAGNOSIS — H2513 Age-related nuclear cataract, bilateral: Secondary | ICD-10-CM | POA: Diagnosis not present

## 2024-04-07 DIAGNOSIS — H524 Presbyopia: Secondary | ICD-10-CM | POA: Diagnosis not present

## 2024-04-07 DIAGNOSIS — H5213 Myopia, bilateral: Secondary | ICD-10-CM | POA: Diagnosis not present

## 2024-04-07 LAB — HM DIABETES EYE EXAM

## 2024-04-08 DIAGNOSIS — C50912 Malignant neoplasm of unspecified site of left female breast: Secondary | ICD-10-CM | POA: Diagnosis not present

## 2024-04-11 ENCOUNTER — Encounter: Payer: Self-pay | Admitting: *Deleted

## 2024-04-11 DIAGNOSIS — D0512 Intraductal carcinoma in situ of left breast: Secondary | ICD-10-CM

## 2024-04-12 DIAGNOSIS — Z510 Encounter for antineoplastic radiation therapy: Secondary | ICD-10-CM | POA: Diagnosis not present

## 2024-04-12 DIAGNOSIS — D0512 Intraductal carcinoma in situ of left breast: Secondary | ICD-10-CM | POA: Diagnosis not present

## 2024-04-13 ENCOUNTER — Other Ambulatory Visit: Payer: Self-pay

## 2024-04-13 ENCOUNTER — Ambulatory Visit
Admission: RE | Admit: 2024-04-13 | Discharge: 2024-04-13 | Disposition: A | Discharge status: Home / Self Care | Source: Ambulatory Visit | Attending: Radiation Oncology | Admitting: Radiation Oncology

## 2024-04-13 DIAGNOSIS — D0512 Intraductal carcinoma in situ of left breast: Secondary | ICD-10-CM | POA: Diagnosis not present

## 2024-04-13 DIAGNOSIS — Z510 Encounter for antineoplastic radiation therapy: Secondary | ICD-10-CM | POA: Diagnosis not present

## 2024-04-13 LAB — RAD ONC ARIA SESSION SUMMARY
Course Elapsed Days: 0
Plan Fractions Treated to Date: 1
Plan Prescribed Dose Per Fraction: 2.67 Gy
Plan Total Fractions Prescribed: 15
Plan Total Prescribed Dose: 40.05 Gy
Reference Point Dosage Given to Date: 2.67 Gy
Reference Point Session Dosage Given: 2.67 Gy
Session Number: 1

## 2024-04-14 ENCOUNTER — Ambulatory Visit
Admission: RE | Admit: 2024-04-14 | Discharge: 2024-04-14 | Disposition: A | Discharge status: Home / Self Care | Source: Ambulatory Visit | Attending: Radiation Oncology | Admitting: Radiation Oncology

## 2024-04-14 ENCOUNTER — Other Ambulatory Visit: Payer: Self-pay

## 2024-04-14 DIAGNOSIS — Z510 Encounter for antineoplastic radiation therapy: Secondary | ICD-10-CM | POA: Diagnosis not present

## 2024-04-14 DIAGNOSIS — D0512 Intraductal carcinoma in situ of left breast: Secondary | ICD-10-CM | POA: Diagnosis not present

## 2024-04-14 LAB — RAD ONC ARIA SESSION SUMMARY
Course Elapsed Days: 1
Plan Fractions Treated to Date: 2
Plan Prescribed Dose Per Fraction: 2.67 Gy
Plan Total Fractions Prescribed: 15
Plan Total Prescribed Dose: 40.05 Gy
Reference Point Dosage Given to Date: 5.34 Gy
Reference Point Session Dosage Given: 2.67 Gy
Session Number: 2

## 2024-04-15 ENCOUNTER — Other Ambulatory Visit: Payer: Self-pay

## 2024-04-15 ENCOUNTER — Ambulatory Visit
Admission: RE | Admit: 2024-04-15 | Discharge: 2024-04-15 | Disposition: A | Discharge status: Home / Self Care | Source: Ambulatory Visit | Attending: Radiation Oncology | Admitting: Radiation Oncology

## 2024-04-15 DIAGNOSIS — D0512 Intraductal carcinoma in situ of left breast: Secondary | ICD-10-CM | POA: Diagnosis not present

## 2024-04-15 DIAGNOSIS — Z510 Encounter for antineoplastic radiation therapy: Secondary | ICD-10-CM | POA: Diagnosis not present

## 2024-04-15 LAB — RAD ONC ARIA SESSION SUMMARY
Course Elapsed Days: 2
Plan Fractions Treated to Date: 3
Plan Prescribed Dose Per Fraction: 2.67 Gy
Plan Total Fractions Prescribed: 15
Plan Total Prescribed Dose: 40.05 Gy
Reference Point Dosage Given to Date: 8.01 Gy
Reference Point Session Dosage Given: 2.67 Gy
Session Number: 3

## 2024-04-18 ENCOUNTER — Ambulatory Visit

## 2024-04-18 ENCOUNTER — Ambulatory Visit: Admission: RE | Admit: 2024-04-18 | Source: Ambulatory Visit

## 2024-04-18 ENCOUNTER — Ambulatory Visit (INDEPENDENT_AMBULATORY_CARE_PROVIDER_SITE_OTHER): Admitting: Family Medicine

## 2024-04-18 ENCOUNTER — Encounter (INDEPENDENT_AMBULATORY_CARE_PROVIDER_SITE_OTHER): Payer: Self-pay | Admitting: Family Medicine

## 2024-04-18 VITALS — BP 152/82 | HR 68 | Temp 98.3°F | Ht 66.0 in | Wt 202.0 lb

## 2024-04-18 DIAGNOSIS — D0512 Intraductal carcinoma in situ of left breast: Secondary | ICD-10-CM | POA: Diagnosis not present

## 2024-04-18 DIAGNOSIS — Z6832 Body mass index (BMI) 32.0-32.9, adult: Secondary | ICD-10-CM | POA: Diagnosis not present

## 2024-04-18 DIAGNOSIS — R7303 Prediabetes: Secondary | ICD-10-CM | POA: Diagnosis not present

## 2024-04-18 DIAGNOSIS — E66811 Obesity, class 1: Secondary | ICD-10-CM

## 2024-04-18 DIAGNOSIS — Z6834 Body mass index (BMI) 34.0-34.9, adult: Secondary | ICD-10-CM

## 2024-04-18 MED ORDER — TRULICITY 4.5 MG/0.5ML ~~LOC~~ SOAJ: 4.5000 mg | SUBCUTANEOUS | 0 refills | Status: DC

## 2024-04-18 NOTE — Assessment & Plan Note (Signed)
 Patient doing well on trulicity  but does think she could tolerate a higher dose for better control of cravings.  4.5mg  dose sent into pharmacy today.  If insurance will not cover that dose will sent in 3mg  dose again.  No GI side effects on Trulicity  previously.

## 2024-04-18 NOTE — Progress Notes (Signed)
 SUBJECTIVE:  Chief Complaint: Obesity  Interim History: Since last appointment patient had workup for her breast cancer.  She had a small amount of radiation and is going on hormone therapy currently. She has been walking and riding her spinning bike but she wants to really get back to concentrating on eating more nutritiously  She realizes that with everything that has happened she has been slightly more indulgent with her food intake.  Alicia Knapp is here to discuss her progress with her obesity treatment plan. She is on the Category 3 Plan and states she is following her eating plan approximately 40 % of the time. She states she is walking 1.5 miles or ride bike for 25 minutes 5 times per week.   OBJECTIVE: Visit Diagnoses: Problem List Items Addressed This Visit       Other   Prediabetes - Primary   Patient doing well on trulicity  but does think she could tolerate a higher dose for better control of cravings.  4.5mg  dose sent into pharmacy today.  If insurance will not cover that dose will sent in 3mg  dose again.  No GI side effects on Trulicity  previously.      Relevant Medications   Dulaglutide  (TRULICITY ) 4.5 MG/0.5ML SOAJ   Class 1 obesity with serious comorbidity and body mass index (BMI) of 34.0 to 34.9 in adult   Relevant Medications   Dulaglutide  (TRULICITY ) 4.5 MG/0.5ML SOAJ   Ductal carcinoma in situ (DCIS) of left breast   Patient has upcoming radiation and antihormone therapy scheduled.  Will continue to see oncology.  Follow on tolerance and any side effects to treatment at next appointment and will discuss possible strategies to ensure she is able to get all nutrition in.      Other Visit Diagnoses       BMI 32.0-32.9,adult           Vitals Temp: 98.3 F (36.8 C) BP: (!) 152/82 Pulse Rate: 68 SpO2: 99 %   Anthropometric Measurements Height: 5' 6 (1.676 m) Weight: 202 lb (91.6 kg) BMI (Calculated): 32.62 Weight at Last Visit: 196 lb Weight Lost Since  Last Visit: 0 Weight Gained Since Last Visit: 6 lb Starting Weight: 212 lb Total Weight Loss (lbs): 10 lb (4.536 kg)   Body Composition  Body Fat %: 42.7 % Fat Mass (lbs): 86.2 lbs Muscle Mass (lbs): 110 lbs Total Body Water (lbs): 73.4 lbs Visceral Fat Rating : 12   Other Clinical Data Today's Visit #: 61 Starting Date: 08/03/18 Comments: Cat 3     ASSESSMENT AND PLAN:  Diet: Alicia Knapp is currently in the action stage of change. As such, her goal is to continue with weight loss efforts and has agreed to the Category 3 Plan.   Exercise:  Older adults should determine their level of effort for physical activity relative to their level of fitness.  Behavior Modification:  We discussed the following Behavioral Modification Strategies today: increasing lean protein intake, decreasing simple carbohydrates, increasing vegetables, meal planning and cooking strategies, and keeping healthy foods in the home.  Patient would like to further discuss additional medication options at her next appointment pending her response to her breast cancer treatment.   Return in about 4 weeks (around 05/16/2024) for fasting labs.   She was informed of the importance of frequent follow up visits to maximize her success with intensive lifestyle modifications for her multiple health conditions.  Attestation Statements:   Reviewed by clinician on day of visit: allergies, medications, problem list,  medical history, surgical history, family history, social history, and previous encounter notes.     Donaciano Frizzle, MD

## 2024-04-18 NOTE — Assessment & Plan Note (Signed)
 Patient has upcoming radiation and antihormone therapy scheduled.  Will continue to see oncology.  Follow on tolerance and any side effects to treatment at next appointment and will discuss possible strategies to ensure she is able to get all nutrition in.

## 2024-04-19 ENCOUNTER — Ambulatory Visit

## 2024-04-19 ENCOUNTER — Ambulatory Visit
Admission: RE | Admit: 2024-04-19 | Discharge: 2024-04-19 | Disposition: A | Discharge status: Home / Self Care | Source: Ambulatory Visit | Attending: Radiation Oncology | Admitting: Radiation Oncology

## 2024-04-19 ENCOUNTER — Other Ambulatory Visit: Payer: Self-pay

## 2024-04-19 DIAGNOSIS — Z510 Encounter for antineoplastic radiation therapy: Secondary | ICD-10-CM | POA: Diagnosis not present

## 2024-04-19 DIAGNOSIS — D0512 Intraductal carcinoma in situ of left breast: Secondary | ICD-10-CM | POA: Diagnosis not present

## 2024-04-19 LAB — RAD ONC ARIA SESSION SUMMARY
Course Elapsed Days: 6
Plan Fractions Treated to Date: 4
Plan Prescribed Dose Per Fraction: 2.67 Gy
Plan Total Fractions Prescribed: 15
Plan Total Prescribed Dose: 40.05 Gy
Reference Point Dosage Given to Date: 10.68 Gy
Reference Point Session Dosage Given: 2.67 Gy
Session Number: 4

## 2024-04-19 MED ORDER — ALRA NON-METALLIC DEODORANT (RAD-ONC)
1.0000 | Freq: Once | TOPICAL | Status: AC
  Administered 2024-04-19: 1 via TOPICAL

## 2024-04-19 MED ORDER — RADIAPLEXRX EX GEL: Freq: Once | CUTANEOUS | Status: AC

## 2024-04-20 ENCOUNTER — Ambulatory Visit
Admission: RE | Admit: 2024-04-20 | Discharge: 2024-04-20 | Disposition: A | Discharge status: Home / Self Care | Source: Ambulatory Visit | Attending: Radiation Oncology

## 2024-04-20 ENCOUNTER — Other Ambulatory Visit: Payer: Self-pay

## 2024-04-20 DIAGNOSIS — D0512 Intraductal carcinoma in situ of left breast: Secondary | ICD-10-CM | POA: Diagnosis not present

## 2024-04-20 DIAGNOSIS — Z510 Encounter for antineoplastic radiation therapy: Secondary | ICD-10-CM | POA: Diagnosis not present

## 2024-04-20 LAB — RAD ONC ARIA SESSION SUMMARY
Course Elapsed Days: 7
Plan Fractions Treated to Date: 5
Plan Prescribed Dose Per Fraction: 2.67 Gy
Plan Total Fractions Prescribed: 15
Plan Total Prescribed Dose: 40.05 Gy
Reference Point Dosage Given to Date: 13.35 Gy
Reference Point Session Dosage Given: 2.67 Gy
Session Number: 5

## 2024-04-21 ENCOUNTER — Ambulatory Visit

## 2024-04-22 ENCOUNTER — Ambulatory Visit
Admission: RE | Admit: 2024-04-22 | Discharge: 2024-04-22 | Disposition: A | Discharge status: Home / Self Care | Source: Ambulatory Visit | Attending: Radiation Oncology | Admitting: Radiation Oncology

## 2024-04-22 ENCOUNTER — Other Ambulatory Visit: Payer: Self-pay

## 2024-04-22 DIAGNOSIS — D0512 Intraductal carcinoma in situ of left breast: Secondary | ICD-10-CM | POA: Diagnosis not present

## 2024-04-22 DIAGNOSIS — Z510 Encounter for antineoplastic radiation therapy: Secondary | ICD-10-CM | POA: Diagnosis not present

## 2024-04-22 LAB — RAD ONC ARIA SESSION SUMMARY
Course Elapsed Days: 9
Plan Fractions Treated to Date: 6
Plan Prescribed Dose Per Fraction: 2.67 Gy
Plan Total Fractions Prescribed: 15
Plan Total Prescribed Dose: 40.05 Gy
Reference Point Dosage Given to Date: 16.02 Gy
Reference Point Session Dosage Given: 2.67 Gy
Session Number: 6

## 2024-04-25 ENCOUNTER — Ambulatory Visit
Admission: RE | Admit: 2024-04-25 | Discharge: 2024-04-25 | Disposition: A | Discharge status: Home / Self Care | Source: Ambulatory Visit | Attending: Radiation Oncology

## 2024-04-25 ENCOUNTER — Other Ambulatory Visit: Payer: Self-pay

## 2024-04-25 ENCOUNTER — Ambulatory Visit
Admission: RE | Admit: 2024-04-25 | Discharge: 2024-04-25 | Disposition: A | Discharge status: Home / Self Care | Source: Ambulatory Visit | Attending: Radiation Oncology | Admitting: Radiation Oncology

## 2024-04-25 DIAGNOSIS — D0512 Intraductal carcinoma in situ of left breast: Secondary | ICD-10-CM | POA: Diagnosis not present

## 2024-04-25 DIAGNOSIS — Z510 Encounter for antineoplastic radiation therapy: Secondary | ICD-10-CM | POA: Diagnosis not present

## 2024-04-25 LAB — RAD ONC ARIA SESSION SUMMARY
Course Elapsed Days: 12
Plan Fractions Treated to Date: 7
Plan Prescribed Dose Per Fraction: 2.67 Gy
Plan Total Fractions Prescribed: 15
Plan Total Prescribed Dose: 40.05 Gy
Reference Point Dosage Given to Date: 18.69 Gy
Reference Point Session Dosage Given: 2.67 Gy
Session Number: 7

## 2024-04-26 ENCOUNTER — Other Ambulatory Visit: Payer: Self-pay

## 2024-04-26 ENCOUNTER — Ambulatory Visit
Admission: RE | Admit: 2024-04-26 | Discharge: 2024-04-26 | Disposition: A | Discharge status: Home / Self Care | Source: Ambulatory Visit | Attending: Radiation Oncology

## 2024-04-26 DIAGNOSIS — Z510 Encounter for antineoplastic radiation therapy: Secondary | ICD-10-CM | POA: Diagnosis not present

## 2024-04-26 DIAGNOSIS — D0512 Intraductal carcinoma in situ of left breast: Secondary | ICD-10-CM | POA: Diagnosis not present

## 2024-04-26 LAB — RAD ONC ARIA SESSION SUMMARY
Course Elapsed Days: 13
Plan Fractions Treated to Date: 8
Plan Prescribed Dose Per Fraction: 2.67 Gy
Plan Total Fractions Prescribed: 15
Plan Total Prescribed Dose: 40.05 Gy
Reference Point Dosage Given to Date: 21.36 Gy
Reference Point Session Dosage Given: 2.67 Gy
Session Number: 8

## 2024-04-27 ENCOUNTER — Other Ambulatory Visit: Payer: Self-pay

## 2024-04-27 ENCOUNTER — Ambulatory Visit
Admission: RE | Admit: 2024-04-27 | Discharge: 2024-04-27 | Disposition: A | Discharge status: Home / Self Care | Source: Ambulatory Visit | Attending: Radiation Oncology

## 2024-04-27 DIAGNOSIS — Z510 Encounter for antineoplastic radiation therapy: Secondary | ICD-10-CM | POA: Diagnosis not present

## 2024-04-27 DIAGNOSIS — D0512 Intraductal carcinoma in situ of left breast: Secondary | ICD-10-CM | POA: Diagnosis not present

## 2024-04-27 LAB — RAD ONC ARIA SESSION SUMMARY
Course Elapsed Days: 14
Plan Fractions Treated to Date: 9
Plan Prescribed Dose Per Fraction: 2.67 Gy
Plan Total Fractions Prescribed: 15
Plan Total Prescribed Dose: 40.05 Gy
Reference Point Dosage Given to Date: 24.03 Gy
Reference Point Session Dosage Given: 2.67 Gy
Session Number: 9

## 2024-04-28 ENCOUNTER — Ambulatory Visit
Admission: RE | Admit: 2024-04-28 | Discharge: 2024-04-28 | Disposition: A | Discharge status: Home / Self Care | Source: Ambulatory Visit | Attending: Radiation Oncology | Admitting: Radiation Oncology

## 2024-04-28 ENCOUNTER — Other Ambulatory Visit: Payer: Self-pay

## 2024-04-28 DIAGNOSIS — D0512 Intraductal carcinoma in situ of left breast: Secondary | ICD-10-CM | POA: Diagnosis not present

## 2024-04-28 DIAGNOSIS — Z510 Encounter for antineoplastic radiation therapy: Secondary | ICD-10-CM | POA: Diagnosis not present

## 2024-04-28 LAB — RAD ONC ARIA SESSION SUMMARY
Course Elapsed Days: 15
Plan Fractions Treated to Date: 10
Plan Prescribed Dose Per Fraction: 2.67 Gy
Plan Total Fractions Prescribed: 15
Plan Total Prescribed Dose: 40.05 Gy
Reference Point Dosage Given to Date: 26.7 Gy
Reference Point Session Dosage Given: 2.67 Gy
Session Number: 10

## 2024-04-29 ENCOUNTER — Other Ambulatory Visit: Payer: Self-pay

## 2024-04-29 ENCOUNTER — Ambulatory Visit
Admission: RE | Admit: 2024-04-29 | Discharge: 2024-04-29 | Disposition: A | Discharge status: Home / Self Care | Source: Ambulatory Visit | Attending: Radiation Oncology | Admitting: Radiation Oncology

## 2024-04-29 DIAGNOSIS — D0512 Intraductal carcinoma in situ of left breast: Secondary | ICD-10-CM | POA: Diagnosis not present

## 2024-04-29 DIAGNOSIS — Z510 Encounter for antineoplastic radiation therapy: Secondary | ICD-10-CM | POA: Diagnosis not present

## 2024-04-29 LAB — RAD ONC ARIA SESSION SUMMARY
Course Elapsed Days: 16
Plan Fractions Treated to Date: 11
Plan Prescribed Dose Per Fraction: 2.67 Gy
Plan Total Fractions Prescribed: 15
Plan Total Prescribed Dose: 40.05 Gy
Reference Point Dosage Given to Date: 29.37 Gy
Reference Point Session Dosage Given: 2.67 Gy
Session Number: 11

## 2024-05-02 ENCOUNTER — Other Ambulatory Visit: Payer: Self-pay

## 2024-05-02 ENCOUNTER — Ambulatory Visit
Admission: RE | Admit: 2024-05-02 | Discharge: 2024-05-02 | Disposition: A | Discharge status: Home / Self Care | Source: Ambulatory Visit | Attending: Radiation Oncology | Admitting: Radiation Oncology

## 2024-05-02 DIAGNOSIS — D0512 Intraductal carcinoma in situ of left breast: Secondary | ICD-10-CM

## 2024-05-02 DIAGNOSIS — Z510 Encounter for antineoplastic radiation therapy: Secondary | ICD-10-CM | POA: Diagnosis not present

## 2024-05-02 LAB — RAD ONC ARIA SESSION SUMMARY
Course Elapsed Days: 19
Plan Fractions Treated to Date: 12
Plan Prescribed Dose Per Fraction: 2.67 Gy
Plan Total Fractions Prescribed: 15
Plan Total Prescribed Dose: 40.05 Gy
Reference Point Dosage Given to Date: 32.04 Gy
Reference Point Session Dosage Given: 2.67 Gy
Session Number: 12

## 2024-05-02 MED ORDER — ALRA NON-METALLIC DEODORANT (RAD-ONC)
1.0000 | Freq: Once | TOPICAL | Status: AC
  Administered 2024-05-02: 1 via TOPICAL

## 2024-05-02 MED ORDER — RADIAPLEXRX EX GEL
Freq: Once | CUTANEOUS | Status: AC
  Administered 2024-05-02: 1 via TOPICAL

## 2024-05-03 ENCOUNTER — Ambulatory Visit

## 2024-05-03 ENCOUNTER — Other Ambulatory Visit: Payer: Self-pay

## 2024-05-03 ENCOUNTER — Ambulatory Visit
Admission: RE | Admit: 2024-05-03 | Discharge: 2024-05-03 | Disposition: A | Discharge status: Home / Self Care | Source: Ambulatory Visit | Attending: Radiation Oncology | Admitting: Radiation Oncology

## 2024-05-03 DIAGNOSIS — D0512 Intraductal carcinoma in situ of left breast: Secondary | ICD-10-CM | POA: Diagnosis not present

## 2024-05-03 DIAGNOSIS — Z510 Encounter for antineoplastic radiation therapy: Secondary | ICD-10-CM | POA: Insufficient documentation

## 2024-05-03 LAB — RAD ONC ARIA SESSION SUMMARY
Course Elapsed Days: 20
Plan Fractions Treated to Date: 13
Plan Prescribed Dose Per Fraction: 2.67 Gy
Plan Total Fractions Prescribed: 15
Plan Total Prescribed Dose: 40.05 Gy
Reference Point Dosage Given to Date: 34.71 Gy
Reference Point Session Dosage Given: 2.67 Gy
Session Number: 13

## 2024-05-04 ENCOUNTER — Ambulatory Visit

## 2024-05-04 ENCOUNTER — Other Ambulatory Visit: Payer: Self-pay | Admitting: Family Medicine

## 2024-05-04 ENCOUNTER — Ambulatory Visit
Admission: RE | Admit: 2024-05-04 | Discharge: 2024-05-04 | Disposition: A | Discharge status: Home / Self Care | Source: Ambulatory Visit | Attending: Radiation Oncology

## 2024-05-04 ENCOUNTER — Other Ambulatory Visit: Payer: Self-pay

## 2024-05-04 DIAGNOSIS — Z510 Encounter for antineoplastic radiation therapy: Secondary | ICD-10-CM | POA: Diagnosis not present

## 2024-05-04 DIAGNOSIS — D0512 Intraductal carcinoma in situ of left breast: Secondary | ICD-10-CM | POA: Diagnosis not present

## 2024-05-04 DIAGNOSIS — J302 Other seasonal allergic rhinitis: Secondary | ICD-10-CM

## 2024-05-04 LAB — RAD ONC ARIA SESSION SUMMARY
Course Elapsed Days: 21
Plan Fractions Treated to Date: 14
Plan Prescribed Dose Per Fraction: 2.67 Gy
Plan Total Fractions Prescribed: 15
Plan Total Prescribed Dose: 40.05 Gy
Reference Point Dosage Given to Date: 37.38 Gy
Reference Point Session Dosage Given: 2.67 Gy
Session Number: 14

## 2024-05-05 ENCOUNTER — Other Ambulatory Visit: Payer: Self-pay

## 2024-05-05 ENCOUNTER — Ambulatory Visit
Admission: RE | Admit: 2024-05-05 | Discharge: 2024-05-05 | Disposition: A | Discharge status: Home / Self Care | Source: Ambulatory Visit | Attending: Radiation Oncology | Admitting: Radiation Oncology

## 2024-05-05 DIAGNOSIS — Z510 Encounter for antineoplastic radiation therapy: Secondary | ICD-10-CM | POA: Diagnosis not present

## 2024-05-05 DIAGNOSIS — D0512 Intraductal carcinoma in situ of left breast: Secondary | ICD-10-CM | POA: Diagnosis not present

## 2024-05-05 LAB — RAD ONC ARIA SESSION SUMMARY
Course Elapsed Days: 22
Plan Fractions Treated to Date: 15
Plan Prescribed Dose Per Fraction: 2.67 Gy
Plan Total Fractions Prescribed: 15
Plan Total Prescribed Dose: 40.05 Gy
Reference Point Dosage Given to Date: 40.05 Gy
Reference Point Session Dosage Given: 2.67 Gy
Session Number: 15

## 2024-05-09 NOTE — Radiation Completion Notes (Signed)
 Patient Name: Alicia Knapp, CROCKER MRN: [id number] Date of Birth: 02/09/1956 Referring Physician: DONNICE BURY, M.D. Date of Service: 2024-05-09 Radiation Oncologist: Lauraine Golden, M.D. [facility] [place]                             RADIATION ONCOLOGY END OF TREATMENT NOTE     Diagnosis: D05.12 Intraductal carcinoma in situ of left breast Staging on 2024-02-10: Ductal carcinoma in situ (DCIS) of left breast T=cTis (DCIS), N=cN0, M=cM0 Intent: Curative     ==========DELIVERED PLANS==========  First Treatment Date: 2024-04-13 Last Treatment Date: 2024-05-05   Plan Name: Breast_L_BH Site: Breast, Left Technique: 3D Mode: Photon Dose Per Fraction: 2.67 Gy Prescribed Dose (Delivered / Prescribed): 40.05 Gy / 40.05 Gy Prescribed Fxs (Delivered / Prescribed): 15 / 15     ==========ON TREATMENT VISIT DATES========== 2024-04-19, 2024-04-25, 2024-05-02     ==========UPCOMING VISITS==========       ==========APPENDIX - ON TREATMENT VISIT NOTES==========   See weekly On Treatment Notes in Epic for details in the Media tab (listed as Progress notes on the On Treatment Visit Dates listed above).

## 2024-05-16 ENCOUNTER — Ambulatory Visit (INDEPENDENT_AMBULATORY_CARE_PROVIDER_SITE_OTHER): Admitting: Family Medicine

## 2024-05-16 ENCOUNTER — Encounter (INDEPENDENT_AMBULATORY_CARE_PROVIDER_SITE_OTHER): Payer: Self-pay | Admitting: Family Medicine

## 2024-05-16 VITALS — BP 149/72 | HR 69 | Temp 98.1°F | Ht 66.0 in | Wt 202.0 lb

## 2024-05-16 DIAGNOSIS — E785 Hyperlipidemia, unspecified: Secondary | ICD-10-CM | POA: Diagnosis not present

## 2024-05-16 DIAGNOSIS — R7303 Prediabetes: Secondary | ICD-10-CM | POA: Diagnosis not present

## 2024-05-16 DIAGNOSIS — E559 Vitamin D deficiency, unspecified: Secondary | ICD-10-CM

## 2024-05-16 DIAGNOSIS — Z6832 Body mass index (BMI) 32.0-32.9, adult: Secondary | ICD-10-CM

## 2024-05-16 DIAGNOSIS — I10 Essential (primary) hypertension: Secondary | ICD-10-CM | POA: Diagnosis not present

## 2024-05-16 DIAGNOSIS — E66811 Obesity, class 1: Secondary | ICD-10-CM

## 2024-05-16 DIAGNOSIS — E669 Obesity, unspecified: Secondary | ICD-10-CM | POA: Diagnosis not present

## 2024-05-16 NOTE — Progress Notes (Signed)
 SUBJECTIVE:  Chief Complaint: Obesity  Interim History: Patient missed a dose of her medication.  She voices her medication has significantly decreased her hunger and she is having to be mindful of her intake.  She is supposed to get labs done today.  She has gone to a few cookouts since last appointment so that has impacted her intake.  She voices that over the next few weeks she thinks she has 2 birthday parties to go to and that's it.    Alicia Knapp is here to discuss her progress with her obesity treatment plan. She is on the Category 3 Plan and states she is following her eating plan approximately 75 % of the time. She states she is exercising 20 minutes 5 times per week.   OBJECTIVE: Visit Diagnoses: Problem List Items Addressed This Visit       Cardiovascular and Mediastinum   Essential hypertension   Relevant Orders   Comprehensive metabolic panel with GFR     Other   Hyperlipidemia   Relevant Orders   Lipid Panel With LDL/HDL Ratio   Vitamin D  deficiency   Relevant Orders   VITAMIN D  25 Hydroxy (Vit-D Deficiency, Fractures)   Prediabetes - Primary   Relevant Orders   Hemoglobin A1c   Insulin , random    Vitals Temp: 98.1 F (36.7 C) BP: (!) 149/72 Pulse Rate: 69 SpO2: 100 %   Anthropometric Measurements Height: 5' 6 (1.676 m) Weight: 202 lb (91.6 kg) BMI (Calculated): 32.62 Weight at Last Visit: 202 lb Weight Lost Since Last Visit: 0 Weight Gained Since Last Visit: 0 Starting Weight: 212 lb Total Weight Loss (lbs): 10 lb (4.536 kg)   Body Composition  Body Fat %: 41.8 % Fat Mass (lbs): 84.6 lbs Muscle Mass (lbs): 111.6 lbs Total Body Water (lbs): 72.8 lbs Visceral Fat Rating : 12   Other Clinical Data Today's Visit #: 90 Starting Date: 08/03/18 Comments: Cat 3     ASSESSMENT AND PLAN: Assessment & Plan Prediabetes Needs repeat labs today- CMP, A1c and Insulin  level ordered.  Patient has been mindful in her food consumption and limiting  simple carbohydrates Essential hypertension Blood pressure slightly elevated today.  She is monitoring BP at home.  No chest pain, chest pressure or headache.  CMP today.  No change in current treatment. Hyperlipidemia, unspecified hyperlipidemia type Patient is working on limiting saturated fat intake to less than 20% of her total intake.  FLP ordered today. Vitamin D  deficiency Previously on prescription strength Vitamin D .  No nausea, vomiting or muscle weakness.  Repeat Vitamin D  level today Obesity with starting BMI of 34.2  BMI 32.0-32.9,adult    Diet: Natale is currently in the action stage of change. As such, her goal is to continue with weight loss efforts and has agreed to the Category 3 Plan.   Exercise:  Older adults should determine their level of effort for physical activity relative to their level of fitness.  Behavior Modification:  We discussed the following Behavioral Modification Strategies today: increasing lean protein intake, decreasing simple carbohydrates, increasing vegetables, and meal planning and cooking strategies.   Return in about 5 weeks (around 06/20/2024).   She was informed of the importance of frequent follow up visits to maximize her success with intensive lifestyle modifications for her multiple health conditions.  Attestation Statements:   Reviewed by clinician on day of visit: allergies, medications, problem list, medical history, surgical history, family history, social history, and previous encounter notes.     [place]  [place], MD

## 2024-05-17 LAB — COMPREHENSIVE METABOLIC PANEL WITH GFR
ALT: 25 IU/L (ref 0–32)
AST: 18 IU/L (ref 0–40)
Albumin: 4.5 g/dL (ref 3.9–4.9)
Alkaline Phosphatase: 98 IU/L (ref 44–121)
BUN/Creatinine Ratio: 15 (ref 12–28)
BUN: 12 mg/dL (ref 8–27)
Bilirubin Total: 0.3 mg/dL (ref 0.0–1.2)
CO2: 22 mmol/L (ref 20–29)
Calcium: 9.9 mg/dL (ref 8.7–10.3)
Chloride: 102 mmol/L (ref 96–106)
Creatinine, Ser: 0.78 mg/dL (ref 0.57–1.00)
Globulin, Total: 3.1 g/dL (ref 1.5–4.5)
Glucose: 86 mg/dL (ref 70–99)
Potassium: 4.8 mmol/L (ref 3.5–5.2)
Sodium: 139 mmol/L (ref 134–144)
Total Protein: 7.6 g/dL (ref 6.0–8.5)
eGFR: 84 mL/min/1.73 (ref >59)

## 2024-05-17 LAB — LIPID PANEL WITH LDL/HDL RATIO
Cholesterol, Total: 143 mg/dL (ref 100–199)
HDL: 47 mg/dL (ref >39)
LDL Chol Calc (NIH): 82 mg/dL (ref 0–99)
LDL/HDL Ratio: 1.7 ratio (ref 0.0–3.2)
Triglycerides: 71 mg/dL (ref 0–149)
VLDL Cholesterol Cal: 14 mg/dL (ref 5–40)

## 2024-05-17 LAB — VITAMIN D 25 HYDROXY (VIT D DEFICIENCY, FRACTURES): Vit D, 25-Hydroxy: 44.7 ng/mL (ref 30.0–100.0)

## 2024-05-17 LAB — INSULIN, RANDOM: INSULIN: 21.5 u[IU]/mL (ref 2.6–24.9)

## 2024-05-17 LAB — HEMOGLOBIN A1C
Est. average glucose Bld gHb Est-mCnc: 103 mg/dL
Hgb A1c MFr Bld: 5.2 % (ref 4.8–5.6)

## 2024-05-28 NOTE — Assessment & Plan Note (Signed)
 Blood pressure slightly elevated today.  She is monitoring BP at home.  No chest pain, chest pressure or headache.  CMP today.  No change in current treatment.

## 2024-05-28 NOTE — Assessment & Plan Note (Signed)
 Patient is working on limiting saturated fat intake to less than 20% of her total intake.  FLP ordered today.

## 2024-05-28 NOTE — Assessment & Plan Note (Signed)
 Needs repeat labs today- CMP, A1c and Insulin  level ordered.  Patient has been mindful in her food consumption and limiting simple carbohydrates

## 2024-05-28 NOTE — Assessment & Plan Note (Signed)
 Previously on prescription strength Vitamin D .  No nausea, vomiting or muscle weakness.  Repeat Vitamin D  level today

## 2024-05-30 ENCOUNTER — Telehealth: Payer: Self-pay

## 2024-05-30 NOTE — Telephone Encounter (Signed)
 Pt verbally confirmed appt for 7/29

## 2024-05-31 ENCOUNTER — Inpatient Hospital Stay: Attending: Hematology and Oncology | Admitting: Hematology and Oncology

## 2024-05-31 VITALS — BP 135/67 | HR 64 | Temp 98.3°F | Resp 17 | Wt 205.6 lb

## 2024-05-31 DIAGNOSIS — D0512 Intraductal carcinoma in situ of left breast: Secondary | ICD-10-CM | POA: Insufficient documentation

## 2024-05-31 DIAGNOSIS — Z170 Estrogen receptor positive status [ER+]: Secondary | ICD-10-CM | POA: Insufficient documentation

## 2024-05-31 MED ORDER — ANASTROZOLE 1 MG PO TABS: 1.0000 mg | ORAL_TABLET | Freq: Every day | ORAL | 3 refills | Status: DC

## 2024-05-31 NOTE — Progress Notes (Signed)
 [facility] CONSULT NOTE  Patient Care Team: Antonio Meth, Jamee SAUNDERS, DO as PCP - General Delores Clayborne LABOR, DO as Consulting Physician (Bariatrics) Tyree Nanetta SAILOR, RN as Oncology Nurse Navigator Glean, Stephane BROCKS, RN (Inactive) as Oncology Nurse Navigator Ebbie Cough, MD as Consulting Physician (General Surgery) Loretha Ash, MD as Consulting Physician (Hematology and Oncology) Izell Domino, MD as Attending Physician (Radiation Oncology)  CHIEF COMPLAINTS/PURPOSE OF CONSULTATION:  DCIS  Assessment & Plan  Ductal carcinoma in situ of breast, status post-lumpectomy and post-radiation Completed radiation therapy on May 05, 2024, following lumpectomy for DCIS of the breast. No current issues related to DCIS.  Initiation and management of aromatase inhibitor therapy for breast cancer Discussed aromatase inhibitors versus tamoxifen for adjuvant therapy post-DCIS treatment. Anastrozole  or letrozole chosen due to her preference to avoid blood clot risk and her active lifestyle supporting bone density. Discussed potential side effects and need for bone density monitoring. - Prescribe anastrozole  or letrozole based on insurance coverage. - Send prescription to [facility] for mail delivery. - Schedule bone density scan within the year. - Follow up in 3-4 months to assess medication tolerance and side effects.  Adverse effect of radiation therapy to breast (skin irritation) Mild skin irritation due to sleeping in a bra post-radiation therapy. - Apply Neosporin to irritated skin area.    HISTORY OF PRESENTING ILLNESS:  Alicia Knapp 68 y.o. female is here because of DCIS  Oncology History  Ductal carcinoma in situ (DCIS) of left breast  01/05/2024 Mammogram   Mammogram showed possible asymmetry in the right breast, possible calcs in the left breast. Suspicious calcifications in the upper inner quadrant of the left breast spanning 1.9 cm.   01/29/2024 Pathology  Results   Left breast needle core biopsy upper inner quadrant showed DCIS, cribriform and comedo, intermediate nuclear grade,  ER 100% positive, strong staining intensity, PR 80% positive strong staining intensity.   02/08/2024 Initial Diagnosis   Ductal carcinoma in situ (DCIS) of left breast   02/10/2024 Cancer Staging   Staging form: Breast, AJCC 8th Edition - Clinical stage from 02/10/2024: Stage 0 (cTis (DCIS), cN0, cM0, ER+, PR+, HER2: Not Assessed) - Signed by Loretha Ash, MD on 02/10/2024 Stage prefix: Initial diagnosis Nuclear grade: G2 Laterality: Left Staged by: Pathologist and managing physician Stage used in treatment planning: Yes National guidelines used in treatment planning: Yes Type of national guideline used in treatment planning: NCCN    History of Present Illness  Alicia Knapp is a 68 year old female with ductal carcinoma in situ (DCIS) who presents for follow-up after completing radiation therapy.  She completed her radiation therapy on May 05, 2024. Since then, she has experienced skin irritation, which she attributes to sleeping in her bra. She uses Neosporin, cream, and deodorant to manage the irritation.  She underwent a lumpectomy prior to radiation therapy as part of her treatment for DCIS.  She is considering medication options for ongoing management, specifically tamoxifen and aromatase inhibitors like anastrozole  and letrozole. She is concerned about side effects such as weight gain and blood clots and prefers medications that do not increase the risk of blood clots.  She attends a weight loss clinic and engages in weight training and walking to maintain her bone density, which was last assessed in 2022 and found to be strong.  She had a hysterectomy, which is relevant to her consideration of tamoxifen, as it eliminates concerns about uterine effects.  All other systems were reviewed  with the patient and are negative.  MEDICAL HISTORY:  Past Medical  History:  Diagnosis Date   Anxiety    B12 deficiency    Cancer (HCC)    Constipation    Heart murmur    Hyperlipidemia    Hypertension    Joint pain    Postoperative nausea    Pre-diabetes    Prediabetes    Swelling    lower ext   Vitamin D  deficiency     SURGICAL HISTORY: Past Surgical History:  Procedure Laterality Date   ABDOMINAL HYSTERECTOMY     APPENDECTOMY     BREAST BIOPSY Left 01/29/2024   MM LT BREAST BX W LOC DEV 1ST LESION IMAGE BX SPEC STEREO GUIDE 01/29/2024 [facility]   BREAST BIOPSY  02/26/2024   MM LT RADIOACTIVE SEED LOC MAMMO GUIDE 02/26/2024 [facility]   BREAST LUMPECTOMY WITH RADIOACTIVE SEED LOCALIZATION Left 02/29/2024   Procedure: BREAST LUMPECTOMY WITH RADIOACTIVE SEED LOCALIZATION;  Surgeon: Ebbie Cough, MD;  Location: [facility] OR;  Service: General;  Laterality: Left;  LEFT BREAST SEED GUIDED LUMPECTOMY   COLONOSCOPY  05/22/2015   Dr.Pyrtle   POLYPECTOMY      SOCIAL HISTORY: Social History   Socioeconomic History   Marital status: Married    Spouse name: Camellia Munroe   Number of children: Not on file   Years of education: Not on file   Highest education level: Not on file  Occupational History   Occupation: Stay at home spouse  Tobacco Use   Smoking status: Former    Current packs/day: 0.00    Types: Cigarettes    Quit date: 09/17/2014    Years since quitting: 9.7   Smokeless tobacco: Never  Vaping Use   Vaping status: Never Used  Substance and Sexual Activity   Alcohol use: No    Alcohol/week: 0.0 standard drinks of alcohol   Drug use: No   Sexual activity: Yes    Partners: Male    Birth control/protection: Post-menopausal  Other Topics Concern   Not on file  Social History Narrative   Lives in house with her husband   Social Drivers of Corporate investment banker Strain: Not on file  Food Insecurity: No Food Insecurity (04/04/2024)   Hunger Vital Sign    Worried About Running Out of Food in the Last Year:  Never true    Ran Out of Food in the Last Year: Never true  Transportation Needs: No Transportation Needs (04/04/2024)   PRAPARE - Administrator, Civil Service (Medical): No    Lack of Transportation (Non-Medical): No  Physical Activity: Not on file  Stress: Not on file  Social Connections: Not on file  Intimate Partner Violence: Not At Risk (04/04/2024)   Humiliation, Afraid, Rape, and Kick questionnaire    Fear of Current or Ex-Partner: No    Emotionally Abused: No    Physically Abused: No    Sexually Abused: No    FAMILY HISTORY: Family History  Problem Relation Age of Onset   Cancer Mother        thyroid    Diabetes Mother    Thyroid  disease Mother    Hypertension Mother    Hyperlipidemia Mother    Kidney disease Mother    Thyroid  cancer Mother    Sleep apnea Mother    Obesity Mother    Cancer Father        prostate   Prostate cancer Father        metastatic  Hypertension Sister    Cancer Brother        prostate   Hypertension Brother    Prostate cancer Brother    Cancer Brother        liver   Stroke Brother    Liver cancer Brother 63       died 43 - 4-5 months after dx   Arthritis Maternal Grandmother    Breast cancer Cousin 20 - 29   Ovarian cancer Cousin    Esophageal cancer Neg Hx    Rectal cancer Neg Hx    Stomach cancer Neg Hx     ALLERGIES:  is allergic to erythromycin and penicillins.  MEDICATIONS:  Current Outpatient Medications  Medication Sig Dispense Refill   amLODipine  (NORVASC ) 10 MG tablet Take 1 tablet (10 mg total) by mouth daily. 90 tablet 1   ARIPiprazole (ABILIFY) 2 MG tablet Take 2 mg by mouth daily.      ARIPiprazole (ABILIFY) 5 MG tablet Take 5 mg by mouth daily.  0   atorvastatin  (LIPITOR) 10 MG tablet Take 1 tablet (10 mg total) by mouth daily. 90 tablet 3   azelastine  (ASTELIN ) 0.1 % nasal spray Place 1 spray into both nostrils 2 (two) times daily. Use in each nostril as directed 90 mL 3   carvedilol  (COREG ) 25 MG  tablet Take 1 tablet (25 mg total) by mouth 2 (two) times daily with a meal. 180 tablet 3   cetirizine (ZYRTEC) 10 MG tablet Take 10 mg by mouth at bedtime.     Dulaglutide  (TRULICITY ) 4.5 MG/0.5ML SOAJ Inject 4.5 mg as directed once a week. 6 mL 0   losartan -hydrochlorothiazide  (HYZAAR) 100-25 MG tablet Take 1 tablet by mouth daily. 90 tablet 3   metFORMIN  (GLUCOPHAGE ) 500 MG tablet Take 1 tablet (500 mg total) by mouth daily with breakfast. 90 tablet 3   Multiple Vitamins-Minerals (MULTIVITAMIN WITH MINERALS) tablet Take 1 tablet by mouth daily.     Vitamin D , Ergocalciferol , (DRISDOL ) 1.25 MG (50000 UNIT) CAPS capsule Take 1 capsule (50,000 Units total) by mouth every 7 (seven) days. 12 capsule 0   fluticasone  (FLONASE ) 50 MCG/ACT nasal spray Place 2 sprays into both nostrils daily. (Patient not taking: Reported on 05/31/2024) 48 g 3   No current facility-administered medications for this visit.     PHYSICAL EXAMINATION: ECOG PERFORMANCE STATUS: 0 - Asymptomatic  Vitals:   05/31/24 1101  BP: 135/67  Pulse: 64  Resp: 17  Temp: 98.3 F (36.8 C)  SpO2: 100%   Filed Weights   05/31/24 1101  Weight: 205 lb 9.6 oz (93.3 kg)    GENERAL:alert, no distress and comfortable  LABORATORY DATA:  I have reviewed the data as listed Lab Results  Component Value Date   WBC 4.3 02/10/2024   HGB 12.5 02/10/2024   HCT 37.8 02/10/2024   MCV 87.1 02/10/2024   PLT 376 02/10/2024     Chemistry      Component Value Date/Time   NA 139 05/16/2024 1130   K 4.8 05/16/2024 1130   CL 102 05/16/2024 1130   CO2 22 05/16/2024 1130   BUN 12 05/16/2024 1130   CREATININE 0.78 05/16/2024 1130   CREATININE 0.72 02/10/2024 1123      Component Value Date/Time   CALCIUM  9.9 05/16/2024 1130   ALKPHOS 98 05/16/2024 1130   AST 18 05/16/2024 1130   AST 14 (L) 02/10/2024 1123   ALT 25 05/16/2024 1130   ALT 19 02/10/2024 1123   BILITOT 0.3  05/16/2024 1130   BILITOT 0.5 02/10/2024 1123        RADIOGRAPHIC STUDIES: I have personally reviewed the radiological images as listed and agreed with the findings in the report. No results found.   All questions were answered. The patient knows to call the clinic with any problems, questions or concerns. I spent 30 minutes in the care of this patient including H and P, review of records, counseling and coordination of care.     Amber Stalls, MD 05/31/2024 11:10 AM

## 2024-06-08 NOTE — Progress Notes (Signed)
  Alicia Knapp presents today for follow-up after completing radiation to her left breast on 05/05/2024. Patient is doing well post treatment. Pain:Denies Skin: Skin is has some peeling but otherwise is healing well after radiation treatment. ROM: None Lymphedema: None MedOnc F/U: None Other issues of note:  None  Pt reports Yes No Comments  Tamoxifen []  []    Letrozole []  []    Anastrazole [x]  []    Mammogram []  Date:  []

## 2024-06-09 ENCOUNTER — Ambulatory Visit
Admission: RE | Admit: 2024-06-09 | Discharge: 2024-06-09 | Disposition: A | Discharge status: Home / Self Care | Source: Ambulatory Visit | Attending: Radiation Oncology | Admitting: Radiation Oncology

## 2024-06-14 ENCOUNTER — Ambulatory Visit (INDEPENDENT_AMBULATORY_CARE_PROVIDER_SITE_OTHER): Admitting: Family Medicine

## 2024-06-21 ENCOUNTER — Ambulatory Visit (INDEPENDENT_AMBULATORY_CARE_PROVIDER_SITE_OTHER): Admitting: Family Medicine

## 2024-06-21 ENCOUNTER — Encounter (INDEPENDENT_AMBULATORY_CARE_PROVIDER_SITE_OTHER): Payer: Self-pay | Admitting: Family Medicine

## 2024-06-21 VITALS — BP 132/78 | HR 80 | Temp 97.7°F | Ht 66.0 in | Wt 202.0 lb

## 2024-06-21 DIAGNOSIS — E669 Obesity, unspecified: Secondary | ICD-10-CM

## 2024-06-21 DIAGNOSIS — Z6832 Body mass index (BMI) 32.0-32.9, adult: Secondary | ICD-10-CM

## 2024-06-21 DIAGNOSIS — E66811 Obesity, class 1: Secondary | ICD-10-CM

## 2024-06-21 DIAGNOSIS — E559 Vitamin D deficiency, unspecified: Secondary | ICD-10-CM

## 2024-06-21 DIAGNOSIS — R7303 Prediabetes: Secondary | ICD-10-CM | POA: Diagnosis not present

## 2024-06-21 MED ORDER — VITAMIN D (ERGOCALCIFEROL) 1.25 MG (50000 UNIT) PO CAPS: 50000.0000 [IU] | ORAL_CAPSULE | ORAL | 0 refills | Status: DC

## 2024-06-21 MED ORDER — TRULICITY 4.5 MG/0.5ML ~~LOC~~ SOAJ
4.5000 mg | SUBCUTANEOUS | 0 refills | Status: DC
Start: 2024-06-21 — End: 2024-09-07

## 2024-06-21 NOTE — Progress Notes (Signed)
   SUBJECTIVE:  Chief Complaint: Obesity  Interim History: Patient has been keeping up with her gardening and mentions that her garden wasn't as good as it has been in years past.  She is teaching herself how to eat- limiting carbs, stick with snacks and she is noticing when she eats a specific way then goes off plan she tends to gain weight.  She has been doing significant mindful eating.  She is planning to cut back a bit more on carb intake.  Basically otherwise she is exercising consistently and gardening.  She started the anastrozole  recently. She is going to a jazz festival over Labor Day weekend then going up to [place] and [place]  in September.  Alicia Knapp is here to discuss her progress with her obesity treatment plan. She is on the Category 3 Plan and states she is following her eating plan approximately 85 % of the time. She states she is exercising 20 minutes 5 times per week.   OBJECTIVE: Visit Diagnoses: Problem List Items Addressed This Visit   None   Vitals Temp: 97.7 F (36.5 C) BP: 132/78 Pulse Rate: 80 SpO2: 100 %   Anthropometric Measurements Height: 5' 6 (1.676 m) Weight: 202 lb (91.6 kg) BMI (Calculated): 32.62 Weight at Last Visit: 202 lb Weight Lost Since Last Visit: 0 Weight Gained Since Last Visit: 0 Starting Weight: 212 lb Total Weight Loss (lbs): 10 lb (4.536 kg)   Body Composition  Body Fat %: 42.7 % Fat Mass (lbs): 86.6 lbs Muscle Mass (lbs): 110.2 lbs Total Body Water (lbs): 74.2 lbs Visceral Fat Rating : 12   Other Clinical Data Today's Visit #: 9 Starting Date: 08/03/18 Comments: Cat  3     ASSESSMENT AND PLAN: Assessment & Plan Vitamin D  deficiency Last vitamin D  level done at previous appointment and is close to goal but not quite there yet.  Goal is level between 60 and 80.  Refill of prescription strength vitamin D  given at this time. Prediabetes Tolerating max dose Trulicity  with an improvement in decreasing simple  carbohydrate intake as well as hunger.  Refill of current dose of Trulicity  sent to pharmacy. Obesity with starting BMI of 34.2  BMI 32.0-32.9,adult    Diet: [name] is currently in the action stage of change. As such, her goal is to continue with weight loss efforts and has agreed to practicing portion control and making smarter food choices, such as increasing vegetables and decreasing simple carbohydrates.   Exercise:  Older adults should determine their level of effort for physical activity relative to their level of fitness. and Older adults with chronic conditions should understand whether and how their conditions affect their ability to do regular physical activity safely.  Behavior Modification:  We discussed the following Behavioral Modification Strategies today: increasing lean protein intake, decreasing simple carbohydrates, increasing vegetables, meal planning and cooking strategies, and planning for success.   Follow-up in 6 weeks.   She was informed of the importance of frequent follow up visits to maximize her success with intensive lifestyle modifications for her multiple health conditions.  Attestation Statements:   Reviewed by clinician on day of visit: allergies, medications, problem list, medical history, surgical history, family history, social history, and previous encounter notes.   Alicia Cho, MD

## 2024-06-27 DIAGNOSIS — F3189 Other bipolar disorder: Secondary | ICD-10-CM | POA: Diagnosis not present

## 2024-06-27 NOTE — Assessment & Plan Note (Signed)
 Tolerating max dose Trulicity  with an improvement in decreasing simple carbohydrate intake as well as hunger.  Refill of current dose of Trulicity  sent to pharmacy.

## 2024-06-27 NOTE — Assessment & Plan Note (Signed)
 Last vitamin D  level done at previous appointment and is close to goal but not quite there yet.  Goal is level between 60 and 80.  Refill of prescription strength vitamin D  given at this time.

## 2024-07-25 ENCOUNTER — Ambulatory Visit (INDEPENDENT_AMBULATORY_CARE_PROVIDER_SITE_OTHER): Admitting: Family Medicine

## 2024-07-25 ENCOUNTER — Encounter (INDEPENDENT_AMBULATORY_CARE_PROVIDER_SITE_OTHER): Payer: Self-pay | Admitting: Family Medicine

## 2024-07-25 VITALS — BP 154/83 | HR 57 | Temp 97.7°F | Ht 66.0 in | Wt 205.0 lb

## 2024-07-25 DIAGNOSIS — E66811 Obesity, class 1: Secondary | ICD-10-CM | POA: Diagnosis not present

## 2024-07-25 DIAGNOSIS — R739 Hyperglycemia, unspecified: Secondary | ICD-10-CM

## 2024-07-25 DIAGNOSIS — D0512 Intraductal carcinoma in situ of left breast: Secondary | ICD-10-CM

## 2024-07-25 DIAGNOSIS — Z6833 Body mass index (BMI) 33.0-33.9, adult: Secondary | ICD-10-CM | POA: Diagnosis not present

## 2024-07-25 MED ORDER — METFORMIN HCL 500 MG PO TABS: 500.0000 mg | ORAL_TABLET | Freq: Two times a day (BID) | ORAL | 0 refills | Status: DC

## 2024-07-25 NOTE — Progress Notes (Signed)
 SUBJECTIVE:  Chief Complaint: Obesity  Interim History: Patient is wondering about adding in additional medication for aiding in weight loss.  Patient mentions she has been traveling more- [address] and [place] .  This month she has a relative's birthday and then she has nothing until Thanksgiving. She is wondering about the weight gaining effects of hormone therapy.   Alicia Knapp is here to discuss her progress with her obesity treatment plan. She is on the practicing portion control and making smarter food choices, such as increasing vegetables and decreasing simple carbohydrates and states she is following her eating plan approximately 75-80 % of the time. She states she is walking 20 minutes 3 times per week.   OBJECTIVE: Visit Diagnoses: Problem List Items Addressed This Visit       Other   Elevated random blood glucose level   Relevant Medications   metFORMIN  (GLUCOPHAGE ) 500 MG tablet   Class 1 obesity with serious comorbidity and body mass index (BMI) of 34.0 to 34.9 in adult   Relevant Medications   metFORMIN  (GLUCOPHAGE ) 500 MG tablet   Ductal carcinoma in situ (DCIS) of left breast - Primary   Other Visit Diagnoses       BMI 33.0-33.9,adult           Vitals Temp: 97.7 F (36.5 C) BP: (!) 154/83 Pulse Rate: (!) 57 SpO2: 100 %   Anthropometric Measurements Height: 5' 6 (1.676 m) Weight: 205 lb (93 kg) BMI (Calculated): 33.1 Weight at Last Visit: 202 lb Weight Lost Since Last Visit: 0 Weight Gained Since Last Visit: 3 Starting Weight: 212 lb Total Weight Loss (lbs): 7 lb (3.175 kg)   Body Composition  Body Fat %: 43.4 % Fat Mass (lbs): 89 lbs Muscle Mass (lbs): 110.2 lbs Total Body Water (lbs): 73.4 lbs Visceral Fat Rating : 13   Other Clinical Data Today's Visit #: 69 Starting Date: 08/03/18 Comments: Cat 3     ASSESSMENT AND PLAN: Assessment & Plan Ductal carcinoma in situ (DCIS) of left breast Patient has upcoming appointment with  oncology to evaluate how she is responded to her medications that she will be on long-term after her treatment for breast cancer.  Reports that the skin on her breast after radiation is finally improving.  Will follow-up at next appointment as to make changes in treatment plan. Elevated random blood glucose level Tolerating metformin  with no GI side effects.  Needs a refill today.  No change in dosage but will continue current treatment plan and dose of metformin .  Follow-up on response at next appointment. Obesity with starting BMI of 34.2  BMI 33.0-33.9,adult    Diet: Alicia Knapp is currently in the action stage of change. As such, her goal is to continue with weight loss efforts and has agreed to practicing portion control and making smarter food choices, such as increasing vegetables and decreasing simple carbohydrates.   Exercise:  Older adults should determine their level of effort for physical activity relative to their level of fitness.  Behavior Modification:  We discussed the following Behavioral Modification Strategies today: increasing lean protein intake, decreasing simple carbohydrates, increasing vegetables, decreasing eating out, and meal planning and cooking strategies.   Return in about 6 weeks (around 09/05/2024).   She was informed of the importance of frequent follow up visits to maximize her success with intensive lifestyle modifications for her multiple health conditions.  Attestation Statements:   Reviewed by clinician on day of visit: allergies, medications, problem list, medical history, surgical history, family history,  social history, and previous encounter notes.   Adelita Cho, MD

## 2024-07-27 ENCOUNTER — Other Ambulatory Visit: Payer: Self-pay | Admitting: Family Medicine

## 2024-07-27 DIAGNOSIS — I10 Essential (primary) hypertension: Secondary | ICD-10-CM

## 2024-07-30 NOTE — Assessment & Plan Note (Signed)
 Patient has upcoming appointment with oncology to evaluate how she is responded to her medications that she will be on long-term after her treatment for breast cancer.  Reports that the skin on her breast after radiation is finally improving.  Will follow-up at next appointment as to make changes in treatment plan.

## 2024-07-30 NOTE — Assessment & Plan Note (Signed)
 Tolerating metformin  with no GI side effects.  Needs a refill today.  No change in dosage but will continue current treatment plan and dose of metformin .  Follow-up on response at next appointment.

## 2024-08-15 ENCOUNTER — Ambulatory Visit: Admitting: Family Medicine

## 2024-08-15 ENCOUNTER — Encounter: Payer: Self-pay | Admitting: Family Medicine

## 2024-08-15 VITALS — BP 138/78 | HR 68 | Temp 98.0°F | Resp 16 | Ht 66.0 in | Wt 210.0 lb

## 2024-08-15 DIAGNOSIS — I10 Essential (primary) hypertension: Secondary | ICD-10-CM

## 2024-08-15 DIAGNOSIS — J302 Other seasonal allergic rhinitis: Secondary | ICD-10-CM | POA: Diagnosis not present

## 2024-08-15 DIAGNOSIS — E785 Hyperlipidemia, unspecified: Secondary | ICD-10-CM | POA: Diagnosis not present

## 2024-08-15 DIAGNOSIS — Z6833 Body mass index (BMI) 33.0-33.9, adult: Secondary | ICD-10-CM

## 2024-08-15 DIAGNOSIS — Z23 Encounter for immunization: Secondary | ICD-10-CM

## 2024-08-15 DIAGNOSIS — R7303 Prediabetes: Secondary | ICD-10-CM | POA: Diagnosis not present

## 2024-08-15 MED ORDER — FLUTICASONE PROPIONATE 50 MCG/ACT NA SUSP: 2.0000 | Freq: Every day | NASAL | 3 refills | Status: AC

## 2024-08-15 MED ORDER — ATORVASTATIN CALCIUM 10 MG PO TABS: 10.0000 mg | ORAL_TABLET | Freq: Every day | ORAL | 3 refills | Status: AC

## 2024-08-15 MED ORDER — AMLODIPINE BESYLATE 10 MG PO TABS: 10.0000 mg | ORAL_TABLET | Freq: Every day | ORAL | 1 refills | Status: AC

## 2024-08-15 MED ORDER — CARVEDILOL 25 MG PO TABS: 25.0000 mg | ORAL_TABLET | Freq: Two times a day (BID) | ORAL | 1 refills | Status: AC

## 2024-08-15 MED ORDER — LOSARTAN POTASSIUM-HCTZ 100-25 MG PO TABS: 1.0000 | ORAL_TABLET | Freq: Every day | ORAL | 3 refills | Status: AC

## 2024-08-15 NOTE — Assessment & Plan Note (Signed)
Con't with diet  Check labs

## 2024-08-15 NOTE — Progress Notes (Signed)
 Subjective:    Patient ID: Alicia Knapp, female    DOB: 03/12/1956, 68 y.o.   MRN: [id number]  Chief Complaint  Patient presents with   Hypertension   Hyperlipidemia   Follow-up    HPI Patient is in today for f/u.  Discussed the use of AI scribe software for clinical note transcription with the patient, who gave verbal consent to proceed.  History of Present Illness Alicia Knapp is a 68 year old female with breast cancer who presents for follow-up after treatment.  She has a history of breast cancer in her left breast, for which she underwent surgery and radiation. She is currently on hormone therapy, planned for five years, and experiences no significant side effects. She completed three weeks of radiation therapy, five days a week. She undergoes annual mammograms and feels anxious about the results due to her history.  She describes the treatment process, including a lumpectomy and the placement of a seed for surgical guidance, as more painful than anticipated, particularly the biopsy procedure. She has been using vitamin E to help lighten the area affected by radiation, which is improving.  She is managing her weight with Trulicity  and metformin , taken twice a day. These medications help curb her appetite, and she is working on getting back on track with her weight management.  Her husband is retired and assists with gardening tasks, as she has some limitations due to lifting and digging.  No swelling in the legs, but she notes her ankles are 'always crazy'.    Past Medical History:  Diagnosis Date   Anxiety    B12 deficiency    Cancer (HCC)    Constipation    Heart murmur    Hyperlipidemia    Hypertension    Joint pain    Postoperative nausea    Pre-diabetes    Prediabetes    Swelling    lower ext   Vitamin D  deficiency     Past Surgical History:  Procedure Laterality Date   ABDOMINAL HYSTERECTOMY     APPENDECTOMY     BREAST BIOPSY Left 01/29/2024   MM LT  BREAST BX W LOC DEV 1ST LESION IMAGE BX SPEC STEREO GUIDE 01/29/2024 [facility]   BREAST BIOPSY  02/26/2024   MM LT RADIOACTIVE SEED LOC MAMMO GUIDE 02/26/2024 [facility]   BREAST LUMPECTOMY WITH RADIOACTIVE SEED LOCALIZATION Left 02/29/2024   Procedure: BREAST LUMPECTOMY WITH RADIOACTIVE SEED LOCALIZATION;  Surgeon: Ebbie Cough, MD;  Location: [facility] OR;  Service: General;  Laterality: Left;  LEFT BREAST SEED GUIDED LUMPECTOMY   COLONOSCOPY  05/22/2015   Dr.Pyrtle   POLYPECTOMY      Family History  Problem Relation Age of Onset   Cancer Mother        thyroid    Diabetes Mother    Thyroid  disease Mother    Hypertension Mother    Hyperlipidemia Mother    Kidney disease Mother    Thyroid  cancer Mother    Sleep apnea Mother    Obesity Mother    Cancer Father        prostate   Prostate cancer Father        metastatic   Hypertension Sister    Cancer Brother        prostate   Hypertension Brother    Prostate cancer Brother    Cancer Brother        liver   Stroke Brother    Liver cancer Brother 69  died 41 - 4-5 months after dx   Arthritis Maternal Grandmother    Breast cancer Cousin 20 - 29   Ovarian cancer Cousin    Esophageal cancer Neg Hx    Rectal cancer Neg Hx    Stomach cancer Neg Hx     Social History   Socioeconomic History   Marital status: Married    Spouse name: Alicia Knapp   Number of children: Not on file   Years of education: Not on file   Highest education level: Not on file  Occupational History   Occupation: Stay at home spouse  Tobacco Use   Smoking status: Former    Current packs/day: 0.00    Types: Cigarettes    Quit date: 09/17/2014    Years since quitting: 9.9   Smokeless tobacco: Never  Vaping Use   Vaping status: Never Used  Substance and Sexual Activity   Alcohol use: No    Alcohol/week: 0.0 standard drinks of alcohol   Drug use: No   Sexual activity: Yes    Partners: Male    Birth control/protection:  Post-menopausal  Other Topics Concern   Not on file  Social History Narrative   Lives in house with her husband   Social Drivers of Corporate investment banker Strain: Not on file  Food Insecurity: No Food Insecurity (04/04/2024)   Hunger Vital Sign    Worried About Running Out of Food in the Last Year: Never true    Ran Out of Food in the Last Year: Never true  Transportation Needs: No Transportation Needs (04/04/2024)   PRAPARE - Administrator, Civil Service (Medical): No    Lack of Transportation (Non-Medical): No  Physical Activity: Not on file  Stress: Not on file  Social Connections: Not on file  Intimate Partner Violence: Not At Risk (04/04/2024)   Humiliation, Afraid, Rape, and Kick questionnaire    Fear of Current or Ex-Partner: No    Emotionally Abused: No    Physically Abused: No    Sexually Abused: No    Outpatient Medications Prior to Visit  Medication Sig Dispense Refill   anastrozole  (ARIMIDEX ) 1 MG tablet Take 1 tablet (1 mg total) by mouth daily. 90 tablet 3   ARIPiprazole (ABILIFY) 2 MG tablet Take 2 mg by mouth daily.      ARIPiprazole (ABILIFY) 5 MG tablet Take 5 mg by mouth daily.  0   azelastine  (ASTELIN ) 0.1 % nasal spray Place 1 spray into both nostrils 2 (two) times daily. Use in each nostril as directed 90 mL 3   cetirizine (ZYRTEC) 10 MG tablet Take 10 mg by mouth at bedtime.     Dulaglutide  (TRULICITY ) 4.5 MG/0.5ML SOAJ Inject 4.5 mg as directed once a week. 6 mL 0   metFORMIN  (GLUCOPHAGE ) 500 MG tablet Take 1 tablet (500 mg total) by mouth 2 (two) times daily with a meal. 180 tablet 0   Multiple Vitamins-Minerals (MULTIVITAMIN WITH MINERALS) tablet Take 1 tablet by mouth daily.     Vitamin D , Ergocalciferol , (DRISDOL ) 1.25 MG (50000 UNIT) CAPS capsule Take 1 capsule (50,000 Units total) by mouth every 7 (seven) days. 12 capsule 0   amLODipine  (NORVASC ) 10 MG tablet Take 1 tablet (10 mg total) by mouth daily. 90 tablet 1   atorvastatin   (LIPITOR) 10 MG tablet Take 1 tablet (10 mg total) by mouth daily. 90 tablet 3   carvedilol  (COREG ) 25 MG tablet Take 1 tablet (25 mg total) by mouth 2 (  two) times daily with a meal. 180 tablet 1   fluticasone  (FLONASE ) 50 MCG/ACT nasal spray Place 2 sprays into both nostrils daily. 48 g 3   losartan -hydrochlorothiazide  (HYZAAR) 100-25 MG tablet Take 1 tablet by mouth daily. 90 tablet 3   No facility-administered medications prior to visit.    Allergies  Allergen Reactions   Erythromycin     Causes stomach cramps   Penicillins     rash    Review of Systems  Constitutional:  Negative for chills, fever and malaise/fatigue.  HENT:  Negative for congestion and hearing loss.   Eyes:  Negative for discharge.  Respiratory:  Negative for cough, sputum production and shortness of breath.   Cardiovascular:  Negative for chest pain, palpitations and leg swelling.  Gastrointestinal:  Negative for abdominal pain, blood in stool, constipation, diarrhea, heartburn, nausea and vomiting.  Genitourinary:  Negative for dysuria, frequency, hematuria and urgency.  Musculoskeletal:  Negative for back pain, falls and myalgias.  Skin:  Negative for rash.  Neurological:  Negative for dizziness, sensory change, loss of consciousness, weakness and headaches.  Endo/Heme/Allergies:  Negative for environmental allergies. Does not bruise/bleed easily.  Psychiatric/Behavioral:  Negative for depression and suicidal ideas. The patient is not nervous/anxious and does not have insomnia.        Objective:    Physical Exam Vitals and nursing note reviewed.  Constitutional:      General: She is not in acute distress.    Appearance: Normal appearance. She is well-developed.  HENT:     Head: Normocephalic and atraumatic.  Eyes:     General: No scleral icterus.       Right eye: No discharge.        Left eye: No discharge.  Cardiovascular:     Rate and Rhythm: Normal rate and regular rhythm.     Heart sounds: No  murmur heard. Pulmonary:     Effort: Pulmonary effort is normal. No respiratory distress.     Breath sounds: Normal breath sounds.  Musculoskeletal:        General: Normal range of motion.     Cervical back: Normal range of motion and neck supple.     Right lower leg: No edema.     Left lower leg: No edema.  Skin:    General: Skin is warm and dry.  Neurological:     Mental Status: She is alert and oriented to person, place, and time.  Psychiatric:        Mood and Affect: Mood normal.        Behavior: Behavior normal.        Thought Content: Thought content normal.        Judgment: Judgment normal.     BP 138/78 (BP Location: Left Arm, Patient Position: Sitting, Cuff Size: Large)   Pulse 68   Temp 98 F (36.7 C) (Oral)   Resp 16   Ht 5' 6 (1.676 m)   Wt 210 lb (95.3 kg)   SpO2 100%   BMI 33.89 kg/m  Wt Readings from Last 3 Encounters:  08/15/24 210 lb (95.3 kg)  07/25/24 205 lb (93 kg)  06/21/24 202 lb (91.6 kg)    Diabetic Foot Exam - Simple   No data filed    Lab Results  Component Value Date   WBC 4.3 02/10/2024   HGB 12.5 02/10/2024   HCT 37.8 02/10/2024   PLT 376 02/10/2024   GLUCOSE 86 05/16/2024   CHOL 143 05/16/2024   TRIG  71 05/16/2024   HDL 47 05/16/2024   LDLDIRECT 140.5 08/12/2013   LDLCALC 82 05/16/2024   ALT 25 05/16/2024   AST 18 05/16/2024   NA 139 05/16/2024   K 4.8 05/16/2024   CL 102 05/16/2024   CREATININE 0.78 05/16/2024   BUN 12 05/16/2024   CO2 22 05/16/2024   TSH 1.50 08/17/2023   HGBA1C 5.2 05/16/2024    Lab Results  Component Value Date   TSH 1.50 08/17/2023   Lab Results  Component Value Date   WBC 4.3 02/10/2024   HGB 12.5 02/10/2024   HCT 37.8 02/10/2024   MCV 87.1 02/10/2024   PLT 376 02/10/2024   Lab Results  Component Value Date   NA 139 05/16/2024   K 4.8 05/16/2024   CO2 22 05/16/2024   GLUCOSE 86 05/16/2024   BUN 12 05/16/2024   CREATININE 0.78 05/16/2024   BILITOT 0.3 05/16/2024   ALKPHOS 98  05/16/2024   AST 18 05/16/2024   ALT 25 05/16/2024   PROT 7.6 05/16/2024   ALBUMIN 4.5 05/16/2024   CALCIUM  9.9 05/16/2024   ANIONGAP 6 02/10/2024   EGFR 84 05/16/2024   GFR 86.16 08/17/2023   Lab Results  Component Value Date   CHOL 143 05/16/2024   Lab Results  Component Value Date   HDL 47 05/16/2024   Lab Results  Component Value Date   LDLCALC 82 05/16/2024   Lab Results  Component Value Date   TRIG 71 05/16/2024   Lab Results  Component Value Date   CHOLHDL 3 08/17/2023   Lab Results  Component Value Date   HGBA1C 5.2 05/16/2024       Assessment & Plan:  Need for influenza vaccination -     Flu vaccine HIGH DOSE PF(Fluzone Trivalent)  Essential hypertension Assessment & Plan: Well controlled, no changes to meds. Encouraged heart healthy diet such as the DASH diet and exercise as tolerated.    Orders: -     amLODIPine  Besylate; Take 1 tablet (10 mg total) by mouth daily.  Dispense: 90 tablet; Refill: 1 -     Carvedilol ; Take 1 tablet (25 mg total) by mouth 2 (two) times daily with a meal.  Dispense: 180 tablet; Refill: 1 -     Losartan  Potassium-HCTZ; Take 1 tablet by mouth daily.  Dispense: 90 tablet; Refill: 3 -     Lipid panel -     TSH  Hyperlipidemia, unspecified hyperlipidemia type Assessment & Plan: Encourage heart healthy diet such as MIND or DASH diet, increase exercise, avoid trans fats, simple carbohydrates and processed foods, consider a krill or fish or flaxseed oil cap daily.    Orders: -     Atorvastatin  Calcium ; Take 1 tablet (10 mg total) by mouth daily.  Dispense: 90 tablet; Refill: 3 -     Lipid panel  Seasonal allergic rhinitis -     Fluticasone  Propionate; Place 2 sprays into both nostrils daily.  Dispense: 48 g; Refill: 3  Prediabetes Assessment & Plan: Con't with diet  Check labs    BMI 33.0-33.9,adult -     VITAMIN D  25 Hydroxy (Vit-D Deficiency, Fractures)    Jamee JONELLE Antonio Cyndee, DO

## 2024-08-15 NOTE — Assessment & Plan Note (Signed)
 Well controlled, no changes to meds. Encouraged heart healthy diet such as the DASH diet and exercise as tolerated.

## 2024-08-15 NOTE — Assessment & Plan Note (Signed)
 Encourage heart healthy diet such as MIND or DASH diet, increase exercise, avoid trans fats, simple carbohydrates and processed foods, consider a krill or fish or flaxseed oil cap daily.

## 2024-08-19 ENCOUNTER — Other Ambulatory Visit: Payer: Self-pay | Admitting: Family Medicine

## 2024-08-19 DIAGNOSIS — I10 Essential (primary) hypertension: Secondary | ICD-10-CM

## 2024-08-31 ENCOUNTER — Telehealth: Payer: Self-pay | Admitting: Family Medicine

## 2024-08-31 NOTE — Telephone Encounter (Signed)
 Copied from CRM [id number]. Topic: Medicare AWV >> Aug 31, 2024  9:21 AM Nathanel DEL wrote: Reason for CRM: Called 08/31/2024 LVM to reschedule AWV appt due appt scheduled 09/01/2024 due to [facility] out sick.   Please call to [phone] to confirm date change 09/15/24 at 810am.  Nathanel Paschal; Care Guide Ambulatory Clinical Support [place] [facility] Direct Dial: [phone]

## 2024-09-01 ENCOUNTER — Ambulatory Visit

## 2024-09-05 ENCOUNTER — Telehealth: Payer: Self-pay | Admitting: Adult Health

## 2024-09-05 NOTE — Telephone Encounter (Signed)
 Left vm for pt about rescheduled appt date and time. Encouraged to call back if need to re- reschedule

## 2024-09-07 ENCOUNTER — Telehealth (INDEPENDENT_AMBULATORY_CARE_PROVIDER_SITE_OTHER): Payer: Self-pay | Admitting: Family Medicine

## 2024-09-07 ENCOUNTER — Other Ambulatory Visit: Payer: Self-pay | Admitting: Family Medicine

## 2024-09-07 ENCOUNTER — Encounter (INDEPENDENT_AMBULATORY_CARE_PROVIDER_SITE_OTHER): Payer: Self-pay | Admitting: Family Medicine

## 2024-09-07 VITALS — Ht 66.0 in

## 2024-09-07 DIAGNOSIS — Z6834 Body mass index (BMI) 34.0-34.9, adult: Secondary | ICD-10-CM | POA: Diagnosis not present

## 2024-09-07 DIAGNOSIS — E559 Vitamin D deficiency, unspecified: Secondary | ICD-10-CM | POA: Diagnosis not present

## 2024-09-07 DIAGNOSIS — R7303 Prediabetes: Secondary | ICD-10-CM

## 2024-09-07 DIAGNOSIS — E669 Obesity, unspecified: Secondary | ICD-10-CM | POA: Diagnosis not present

## 2024-09-07 DIAGNOSIS — E66811 Obesity, class 1: Secondary | ICD-10-CM

## 2024-09-07 DIAGNOSIS — Z853 Personal history of malignant neoplasm of breast: Secondary | ICD-10-CM

## 2024-09-07 MED ORDER — TRULICITY 4.5 MG/0.5ML ~~LOC~~ SOAJ: 4.5000 mg | SUBCUTANEOUS | 0 refills | Status: DC

## 2024-09-07 MED ORDER — VITAMIN D (ERGOCALCIFEROL) 1.25 MG (50000 UNIT) PO CAPS: 50000.0000 [IU] | ORAL_CAPSULE | ORAL | 0 refills | Status: DC

## 2024-09-07 NOTE — Progress Notes (Addendum)
 TeleHealth Visit:   Alicia Knapp has verbally consented to this TeleHealth visit. The patient is located at home, the provider is located at the [facility] office. The participants in this visit include the listed provider and patient. The visit was conducted today via Forensic Psychologist.    Chief Complaint: OBESITY Alicia Knapp is here to discuss her progress with her obesity treatment plan along with follow-up of her obesity related diagnoses. Alicia Knapp is on practicing portion control and making smarter food choices, such as increasing vegetables and decreasing simple carbohydrates and states she is following her eating plan approximately 90% of the time. Alicia Knapp states she is cycling and strength training 40 minutes 4 times per week.  No data recorded Anthropometric Measurements Height: 5' 6 (1.676 m) Weight at Last Visit: 205 lb Starting Weight: 212 lb   No data recorded Other Clinical Data Today's Visit #: [id number] Starting Date: 08/03/18 Comments: [facility]/[place]    Reported Weight: No weight available  Subjective:  Patient has been dealing with URI type symptoms for the last week.  Since last appointment she has been really focusing on teaching herself how to eat.  She has been exercising and trying to do what she can while she has been sick.  She is exercising 4 days a week.  Monday she is doing lower body strength training, Tuesday cycling, Wednesday lower body strength training, Thursday cycling and Friday upper body strength training. Mammogram appointment on March 6th.  Planning on staying local for the holidays.   There are no diagnoses linked to this encounter. Assessment/Plan:   Assessment & Plan Prediabetes Patient is doing well on Trulicity  without any side effects.  She is finding that Trulicity  is helping control her intake and cravings.  She wants to be focused on portion control during the holiday season.  Needs refill of Trulicity  today. Vitamin D   deficiency On Vitamin D  prescription weekly.  No nausea, muscle weakness or vomiting.  Needs refill of prescription strength Vitamin D  today. Obesity with starting BMI of 34.2    Alicia Knapp is currently in the action stage of change. As such, her goal is to continue with weight loss efforts and has agreed to practicing portion control and making smarter food choices, such as increasing vegetables and decreasing simple carbohydrates.   Exercise goals: Older adults should determine their level of effort for physical activity relative to their level of fitness.  Older adults with chronic conditions should understand whether and how their conditions affect their ability to do regular physical activity safely.  Behavioral modification strategies: increasing lean protein intake, decreasing simple carbohydrates, keeping healthy foods in the home, better snacking choices, and holiday eating strategies .  Alicia Knapp has agreed to follow-up with our clinic in 5 weeks. She was informed of the importance of frequent follow-up visits to maximize her success with intensive lifestyle modifications for her multiple health conditions.   Objective:   VITALS: Per patient if applicable, see vitals. GENERAL: Alert and in no acute distress. CARDIOPULMONARY: No increased WOB. Speaking in clear sentences.  PSYCH: Pleasant and cooperative. Speech normal rate and rhythm. Affect is appropriate. Insight and judgement are appropriate. Attention is focused, linear, and appropriate.  NEURO: Oriented as arrived to appointment on time with no prompting.   Lab Results  Component Value Date   CREATININE 0.78 05/16/2024   BUN 12 05/16/2024   NA 139 05/16/2024   K 4.8 05/16/2024   CL 102 05/16/2024   CO2 22 05/16/2024  Lab Results  Component Value Date   ALT 25 05/16/2024   AST 18 05/16/2024   ALKPHOS 98 05/16/2024   BILITOT 0.3 05/16/2024   Lab Results  Component Value Date   HGBA1C 5.2 05/16/2024   HGBA1C 5.4 12/17/2023    HGBA1C 5.5 01/21/2023   HGBA1C 5.5 07/22/2022   HGBA1C 5.5 04/01/2022   Lab Results  Component Value Date   INSULIN  21.5 05/16/2024   INSULIN  15.2 12/17/2023   INSULIN  16.1 01/21/2023   INSULIN  19.3 07/22/2022   INSULIN  11.2 12/25/2020   Lab Results  Component Value Date   TSH 1.50 08/17/2023   Lab Results  Component Value Date   CHOL 143 05/16/2024   HDL 47 05/16/2024   LDLCALC 82 05/16/2024   LDLDIRECT 140.5 08/12/2013   TRIG 71 05/16/2024   CHOLHDL 3 08/17/2023   Lab Results  Component Value Date   VD25OH 44.7 05/16/2024   VD25OH 77.1 12/17/2023   VD25OH 31.74 08/17/2023   Lab Results  Component Value Date   WBC 4.3 02/10/2024   HGB 12.5 02/10/2024   HCT 37.8 02/10/2024   MCV 87.1 02/10/2024   PLT 376 02/10/2024   No results found for: IRON, TIBC, FERRITIN  Attestation Statements:   Reviewed by clinician on day of visit: allergies, medications, problem list, medical history, surgical history, family history, social history, and previous encounter notes.     Adelita Cho, MD

## 2024-09-08 ENCOUNTER — Ambulatory Visit (INDEPENDENT_AMBULATORY_CARE_PROVIDER_SITE_OTHER): Admitting: Family Medicine

## 2024-09-12 NOTE — Assessment & Plan Note (Signed)
 Patient is doing well on Trulicity  without any side effects.  She is finding that Trulicity  is helping control her intake and cravings.  She wants to be focused on portion control during the holiday season.  Needs refill of Trulicity  today.

## 2024-09-12 NOTE — Assessment & Plan Note (Signed)
 On Vitamin D  prescription weekly.  No nausea, muscle weakness or vomiting.  Needs refill of prescription strength Vitamin D  today.

## 2024-09-13 ENCOUNTER — Encounter: Admitting: Adult Health

## 2024-09-15 ENCOUNTER — Ambulatory Visit

## 2024-09-15 ENCOUNTER — Telehealth: Payer: Self-pay

## 2024-09-15 NOTE — Telephone Encounter (Signed)
 Unsuccessful attempts to reach patient on preferred number listed in notes for scheduled AWV. Left message on voicemail okay to reschedule.

## 2024-09-19 DIAGNOSIS — F3170 Bipolar disorder, currently in remission, most recent episode unspecified: Secondary | ICD-10-CM | POA: Diagnosis not present

## 2024-09-23 ENCOUNTER — Encounter: Payer: Self-pay | Admitting: Adult Health

## 2024-09-23 ENCOUNTER — Inpatient Hospital Stay: Attending: Adult Health | Admitting: Adult Health

## 2024-09-23 VITALS — BP 152/64 | HR 70 | Temp 97.7°F | Resp 18 | Wt 208.2 lb

## 2024-09-23 DIAGNOSIS — Z800 Family history of malignant neoplasm of digestive organs: Secondary | ICD-10-CM | POA: Diagnosis not present

## 2024-09-23 DIAGNOSIS — D0512 Intraductal carcinoma in situ of left breast: Secondary | ICD-10-CM

## 2024-09-23 DIAGNOSIS — Z170 Estrogen receptor positive status [ER+]: Secondary | ICD-10-CM | POA: Diagnosis not present

## 2024-09-23 DIAGNOSIS — Z79811 Long term (current) use of aromatase inhibitors: Secondary | ICD-10-CM | POA: Diagnosis not present

## 2024-09-23 DIAGNOSIS — Z803 Family history of malignant neoplasm of breast: Secondary | ICD-10-CM | POA: Diagnosis not present

## 2024-09-23 DIAGNOSIS — Z87891 Personal history of nicotine dependence: Secondary | ICD-10-CM | POA: Diagnosis not present

## 2024-09-23 DIAGNOSIS — Z923 Personal history of irradiation: Secondary | ICD-10-CM | POA: Diagnosis not present

## 2024-09-23 NOTE — Progress Notes (Signed)
 SURVIVORSHIP VISIT:  BRIEF ONCOLOGIC HISTORY:  Oncology History  Ductal carcinoma in situ (DCIS) of left breast  01/05/2024 Mammogram   Mammogram showed possible asymmetry in the right breast, possible calcs in the left breast. Suspicious calcifications in the upper inner quadrant of the left breast spanning 1.9 cm.   01/29/2024 Pathology Results   Left breast needle core biopsy upper inner quadrant showed DCIS, cribriform and comedo, intermediate nuclear grade,  ER 100% positive, strong staining intensity, PR 80% positive strong staining intensity.   02/10/2024 Cancer Staging   Staging form: Breast, AJCC 8th Edition - Clinical stage from 02/10/2024: Stage 0 (cTis (DCIS), cN0, cM0, ER+, PR+, HER2: Not Assessed) - Signed by Loretha Ash, MD on 02/10/2024 Stage prefix: Initial diagnosis Nuclear grade: G2 Laterality: Left Staged by: Pathologist and managing physician Stage used in treatment planning: Yes National guidelines used in treatment planning: Yes Type of national guideline used in treatment planning: NCCN   02/29/2024 Surgery   Left breast lumpectomy: DCIS, 2 mm, intermediate grade, margins negative   04/13/2024 - 05/05/2024 Radiation Therapy   Plan Name: Breast_L_BH Site: Breast, Left Technique: 3D Mode: Photon Dose Per Fraction: 2.67 Gy Prescribed Dose (Delivered / Prescribed): 40.05 Gy / 40.05 Gy Prescribed Fxs (Delivered / Prescribed): 15 / 15   06/2024 -  Anti-estrogen oral therapy   Anastrozole      INTERVAL HISTORY:  Discussed the use of AI scribe software for clinical note transcription with the patient, who gave verbal consent to proceed.  History of Present Illness Alicia Knapp is a 68 year old female with left-sided stage 0 noninvasive breast cancer who presents for a survivorship visit.  She underwent a lumpectomy and radiation therapy for her left-sided stage 0 noninvasive breast cancer. She is currently on anastrozole  without significant side effects. She  engages in weight training twice a week to maintain muscle strength.  She did not undergo genetic testing at the time of her diagnosis and has no plans to pursue it currently. There is a family history of various cancers, but no specific genetic testing has been done on her relatives.  She has not experienced any swelling or lymphedema. Her social history includes a past history of smoking, which she quit over 20 years ago. She is currently overweight but engages in regular exercise and is mindful of her diet.    REVIEW OF SYSTEMS:  Review of Systems  Constitutional:  Negative for appetite change, chills, fatigue, fever and unexpected weight change.  HENT:   Negative for hearing loss, lump/mass and trouble swallowing.   Eyes:  Negative for eye problems and icterus.  Respiratory:  Negative for chest tightness, cough and shortness of breath.   Cardiovascular:  Negative for chest pain, leg swelling and palpitations.  Gastrointestinal:  Negative for abdominal distention, abdominal pain, constipation, diarrhea, nausea and vomiting.  Endocrine: Negative for hot flashes.  Genitourinary:  Negative for difficulty urinating.   Musculoskeletal:  Negative for arthralgias.  Skin:  Negative for itching and rash.  Neurological:  Negative for dizziness, extremity weakness, headaches and numbness.  Hematological:  Negative for adenopathy. Does not bruise/bleed easily.  Psychiatric/Behavioral:  Negative for depression. The patient is not nervous/anxious.    Breast: Denies any new nodularity, masses, tenderness, nipple changes, or nipple discharge.       PAST MEDICAL/SURGICAL HISTORY:  Past Medical History:  Diagnosis Date   Anxiety    B12 deficiency    Cancer (HCC)    Constipation    Heart murmur  Hyperlipidemia    Hypertension    Joint pain    Postoperative nausea    Pre-diabetes    Prediabetes    Swelling    lower ext   Vitamin D  deficiency    Past Surgical History:  Procedure  Laterality Date   ABDOMINAL HYSTERECTOMY     APPENDECTOMY     BREAST BIOPSY Left 01/29/2024   MM LT BREAST BX W LOC DEV 1ST LESION IMAGE BX SPEC STEREO GUIDE 01/29/2024 [facility]   BREAST BIOPSY  02/26/2024   MM LT RADIOACTIVE SEED LOC MAMMO GUIDE 02/26/2024 [facility]   BREAST LUMPECTOMY WITH RADIOACTIVE SEED LOCALIZATION Left 02/29/2024   Procedure: BREAST LUMPECTOMY WITH RADIOACTIVE SEED LOCALIZATION;  Surgeon: Ebbie Cough, MD;  Location: [facility] OR;  Service: General;  Laterality: Left;  LEFT BREAST SEED GUIDED LUMPECTOMY   COLONOSCOPY  05/22/2015   Dr.Pyrtle   POLYPECTOMY       ALLERGIES:  Allergies  Allergen Reactions   Erythromycin     Causes stomach cramps   Penicillins     rash     CURRENT MEDICATIONS:  Outpatient Encounter Medications as of 09/23/2024  Medication Sig   amLODipine  (NORVASC ) 10 MG tablet Take 1 tablet (10 mg total) by mouth daily.   anastrozole  (ARIMIDEX ) 1 MG tablet Take 1 tablet (1 mg total) by mouth daily.   ARIPiprazole (ABILIFY) 2 MG tablet Take 2 mg by mouth daily.    ARIPiprazole (ABILIFY) 5 MG tablet Take 5 mg by mouth daily.   atorvastatin  (LIPITOR) 10 MG tablet Take 1 tablet (10 mg total) by mouth daily.   azelastine  (ASTELIN ) 0.1 % nasal spray Place 1 spray into both nostrils 2 (two) times daily. Use in each nostril as directed   carvedilol  (COREG ) 25 MG tablet Take 1 tablet (25 mg total) by mouth 2 (two) times daily with a meal.   cetirizine (ZYRTEC) 10 MG tablet Take 10 mg by mouth at bedtime.   Dulaglutide  (TRULICITY ) 4.5 MG/0.5ML SOAJ Inject 4.5 mg as directed once a week.   fluticasone  (FLONASE ) 50 MCG/ACT nasal spray Place 2 sprays into both nostrils daily.   losartan -hydrochlorothiazide  (HYZAAR) 100-25 MG tablet Take 1 tablet by mouth daily.   metFORMIN  (GLUCOPHAGE ) 500 MG tablet Take 1 tablet (500 mg total) by mouth 2 (two) times daily with a meal.   Multiple Vitamins-Minerals (MULTIVITAMIN WITH MINERALS) tablet Take 1  tablet by mouth daily.   Vitamin D , Ergocalciferol , (DRISDOL ) 1.25 MG (50000 UNIT) CAPS capsule Take 1 capsule (50,000 Units total) by mouth every 7 (seven) days.   No facility-administered encounter medications on file as of 09/23/2024.     ONCOLOGIC FAMILY HISTORY:  Family History  Problem Relation Age of Onset   Cancer Mother        thyroid    Diabetes Mother    Thyroid  disease Mother    Hypertension Mother    Hyperlipidemia Mother    Kidney disease Mother    Thyroid  cancer Mother    Sleep apnea Mother    Obesity Mother    Cancer Father        prostate   Prostate cancer Father        metastatic   Hypertension Sister    Cancer Brother        prostate   Hypertension Brother    Prostate cancer Brother    Cancer Brother        liver   Stroke Brother    Liver cancer Brother 52  died 67 - 4-5 months after dx   Arthritis Maternal Grandmother    Breast cancer Cousin 20 - 29   Ovarian cancer Cousin    Esophageal cancer Neg Hx    Rectal cancer Neg Hx    Stomach cancer Neg Hx      SOCIAL HISTORY:  Social History   Socioeconomic History   Marital status: Married    Spouse name: Camellia Munroe   Number of children: Not on file   Years of education: Not on file   Highest education level: Not on file  Occupational History   Occupation: Stay at home spouse  Tobacco Use   Smoking status: Former    Current packs/day: 0.00    Types: Cigarettes    Quit date: 09/17/2014    Years since quitting: 10.0   Smokeless tobacco: Never  Vaping Use   Vaping status: Never Used  Substance and Sexual Activity   Alcohol use: No    Alcohol/week: 0.0 standard drinks of alcohol   Drug use: No   Sexual activity: Yes    Partners: Male    Birth control/protection: Post-menopausal  Other Topics Concern   Not on file  Social History Narrative   Lives in house with her husband   Social Drivers of Health   Financial Resource Strain: Low Risk  (09/23/2024)   Overall Financial  Resource Strain (CARDIA)    Difficulty of Paying Living Expenses: Not hard at all  Food Insecurity: No Food Insecurity (09/23/2024)   Hunger Vital Sign    Worried About Running Out of Food in the Last Year: Never true    Ran Out of Food in the Last Year: Never true  Transportation Needs: No Transportation Needs (09/23/2024)   PRAPARE - Administrator, Civil Service (Medical): No    Lack of Transportation (Non-Medical): No  Physical Activity: Insufficiently Active (09/23/2024)   Exercise Vital Sign    Days of Exercise per Week: 4 days    Minutes of Exercise per Session: 20 min  Stress: No Stress Concern Present (09/23/2024)   [employer] of Occupational Health - Occupational Stress Questionnaire    Feeling of Stress: Not at all  Social Connections: Moderately Integrated (09/23/2024)   Social Connection and Isolation Panel    Frequency of Communication with Friends and Family: More than three times a week    Frequency of Social Gatherings with Friends and Family: Once a week    Attends Religious Services: More than 4 times per year    Active Member of [employer] or Organizations: No    Attends Banker Meetings: Never    Marital Status: Married  Catering Manager Violence: Not At Risk (09/23/2024)   Humiliation, Afraid, Rape, and Kick questionnaire    Fear of Current or Ex-Partner: No    Emotionally Abused: No    Physically Abused: No    Sexually Abused: No     OBSERVATIONS/OBJECTIVE:  BP (!) 152/64 (BP Location: Left Arm, Patient Position: Sitting)   Pulse 70   Temp 97.7 F (36.5 C) (Temporal)   Resp 18   Wt 208 lb 3.2 oz (94.4 kg)   SpO2 97%   BMI 33.60 kg/m  GENERAL: Patient is a well appearing female in no acute distress HEENT:  Sclerae anicteric.  Oropharynx clear and moist. No ulcerations or evidence of oropharyngeal candidiasis. Neck is supple.  NODES:  No cervical, supraclavicular, or axillary lymphadenopathy palpated.  BREAST EXAM: left  breast s/p lumpectomy and radiation, no  sign of local recurrence; right breast benign LUNGS:  Clear to auscultation bilaterally.  No wheezes or rhonchi. HEART:  Regular rate and rhythm. No murmur appreciated. ABDOMEN:  Soft, nontender.  Positive, normoactive bowel sounds. No organomegaly palpated. MSK:  No focal spinal tenderness to palpation. Full range of motion bilaterally in the upper extremities. EXTREMITIES:  No peripheral edema.   SKIN:  Clear with no obvious rashes or skin changes. No nail dyscrasia. NEURO:  Nonfocal. Well oriented.  Appropriate affect.   LABORATORY DATA:  None for this visit.  DIAGNOSTIC IMAGING:  None for this visit.      ASSESSMENT AND PLAN:  Ms.. Roma is a pleasant 68 y.o. female with Stage 0 left breast DCIS, ER+/PR+/HER2-, diagnosed in 01/2024, treated with lumpectomy, adjuvant radiation therapy, and anti-estrogen therapy with Anastrozole  beginning in 06/2024.  She presents to the Survivorship Clinic for our initial meeting and routine follow-up post-completion of treatment for breast cancer.    1. Stage 0 left breast cancer:  Ms. Meller is continuing to recover from definitive treatment for breast cancer. She will follow-up with her medical oncologist, Dr. Loretha in 12/2024 with history and physical exam per surveillance protocol.  She will continue her anti-estrogen therapy with Anastrozole . Thus far, she is tolerating the Anastrozole  well, with minimal side effects. Her mammogram is due 01/2025; orders placed today.   Today, a comprehensive survivorship care plan and treatment summary was reviewed with the patient today detailing her breast cancer diagnosis, treatment course, potential late/long-term effects of treatment, appropriate follow-up care with recommendations for the future, and patient education resources.  A copy of this summary, along with a letter will be sent to the patient's primary care provider via mail/fax/In Basket message after today's visit.     2. Bone health:  Given Ms. Witting's age/history of breast cancer and her current treatment regimen including anti-estrogen therapy with Anastrozole , she is at risk for bone demineralization.  She is scheduled for bone density testing in 02/2025.  She was given education on specific activities to promote bone health.  3. Cancer screening:  Due to Ms. Rick's history and her age, she should receive screening for skin cancers, colon cancer, and gynecologic cancers.  The information and recommendations are listed on the patient's comprehensive care plan/treatment summary and were reviewed in detail with the patient.    4. Health maintenance and wellness promotion: Ms. Stuck was encouraged to consume 5-7 servings of fruits and vegetables per day. We reviewed the Nutrition Rainbow handout.  She was also encouraged to engage in moderate to vigorous exercise for 30 minutes per day most days of the week.  She was instructed to limit her alcohol consumption and continue to abstain from tobacco use.     5. Support services/counseling: It is not uncommon for this period of the patient's cancer care trajectory to be one of many emotions and stressors.   She was given information regarding our available services and encouraged to contact me with any questions or for help enrolling in any of our support group/programs.    Follow up instructions:    -Return to cancer center in 12/2024 for f/u with Dr. Loretha  -Mammogram due in 01/2025 -Follow-up with Dr. Ebbie in 03/2025 -Bone density testing in 02/2025 and 02/2027 -She is welcome to return back to the Survivorship Clinic at any time; no additional follow-up needed at this time.  -Consider referral back to survivorship as a long-term survivor for continued surveillance  The patient was provided an  opportunity to ask questions and all were answered. The patient agreed with the plan and demonstrated an understanding of the instructions.   Total encounter  time:45 minutes*in face-to-face visit time, chart review, lab review, care coordination, order entry, and documentation of the encounter time.    Morna Kendall, NP 09/26/24 3:48 PM Medical Oncology and Hematology [facility] [address] [place], [place] [address] Tel. [phone]    Fax. [phone]  *Total Encounter Time as defined by the Centers for Medicare and Medicaid Services includes, in addition to the face-to-face time of a patient visit (documented in the note above) non-face-to-face time: obtaining and reviewing outside history, ordering and reviewing medications, tests or procedures, care coordination (communications with other health care professionals or caregivers) and documentation in the medical record.

## 2024-09-26 ENCOUNTER — Telehealth: Payer: Self-pay | Admitting: Hematology and Oncology

## 2024-09-26 NOTE — Telephone Encounter (Signed)
 left vm for pt about f/u appt dates and times. Encouraged to call back if need to reschedule

## 2024-10-03 ENCOUNTER — Ambulatory Visit (INDEPENDENT_AMBULATORY_CARE_PROVIDER_SITE_OTHER): Admitting: Family Medicine

## 2024-10-03 ENCOUNTER — Encounter (INDEPENDENT_AMBULATORY_CARE_PROVIDER_SITE_OTHER): Payer: Self-pay | Admitting: Family Medicine

## 2024-10-03 VITALS — BP 148/64 | HR 69 | Temp 98.1°F | Ht 66.0 in | Wt 202.0 lb

## 2024-10-03 DIAGNOSIS — R739 Hyperglycemia, unspecified: Secondary | ICD-10-CM | POA: Diagnosis not present

## 2024-10-03 DIAGNOSIS — E669 Obesity, unspecified: Secondary | ICD-10-CM

## 2024-10-03 DIAGNOSIS — Z6832 Body mass index (BMI) 32.0-32.9, adult: Secondary | ICD-10-CM | POA: Diagnosis not present

## 2024-10-03 DIAGNOSIS — E559 Vitamin D deficiency, unspecified: Secondary | ICD-10-CM

## 2024-10-03 DIAGNOSIS — Z6834 Body mass index (BMI) 34.0-34.9, adult: Secondary | ICD-10-CM

## 2024-10-03 MED ORDER — METFORMIN HCL 500 MG PO TABS: 500.0000 mg | ORAL_TABLET | Freq: Two times a day (BID) | ORAL | 0 refills | Status: AC

## 2024-10-03 NOTE — Progress Notes (Unsigned)
 SUBJECTIVE:  Chief Complaint: Obesity  Interim History: Patient Thanksgiving food was not as indulgent as it could have been.  She has been being much more mindful of her food intake over the last two months.  She finds the addition of metformin  has helped control her intake overall.  For December her husband is turning 57 and they are going to [employer].  For end of December she is having seafood for Christmas.  She has intermittent follow up with her oncologist and surgeon.    Alicia Knapp is here to discuss her progress with her obesity treatment plan. She is on the practicing portion control and making smarter food choices, such as increasing vegetables and decreasing simple carbohydrates and states she is following her eating plan approximately 100 % of the time. She states she is exercising 20 minutes 4 times per week.   OBJECTIVE: Visit Diagnoses: Problem List Items Addressed This Visit       Other   Vitamin D  deficiency   Elevated random blood glucose level - Primary   Relevant Medications   metFORMIN  (GLUCOPHAGE ) 500 MG tablet   Class 1 obesity with serious comorbidity and body mass index (BMI) of 34.0 to 34.9 in adult   Relevant Medications   metFORMIN  (GLUCOPHAGE ) 500 MG tablet   Other Visit Diagnoses       BMI 32.0-32.9,adult           Vitals Temp: 98.1 F (36.7 C) BP: (!) 148/64 Pulse Rate: 69 SpO2: 100 %   Anthropometric Measurements Height: 5' 6 (1.676 m) Weight: 202 lb (91.6 kg) BMI (Calculated): 32.62 Weight at Last Visit: 205 lb Weight Lost Since Last Visit: 3 lb Weight Gained Since Last Visit: 0 Starting Weight: 212 lb Total Weight Loss (lbs): 10 lb (4.536 kg)   Body Composition  Body Fat %: 43.6 % Fat Mass (lbs): 88.4 lbs Muscle Mass (lbs): 108.6 lbs Total Body Water (lbs): 75.2 lbs Visceral Fat Rating : 12   Other Clinical Data Today's Visit #: 40 Starting Date: 08/03/18 Comments: PC/[place]     ASSESSMENT AND PLAN: Assessment &  Plan Elevated random blood glucose level Patient is tolerating combination therapy including Trulicity  and metformin .  Denies needing a refill of Trulicity  at this time.  She does need a refill of metformin  and will continue current dosage. Vitamin D  deficiency Vitamin D  level from July still below goal.  Patient is on vitamin D  supplementation at this time.  Will need repeat labs in January or early February to reassess. BMI 32.0-32.9,adult  Obesity with starting BMI of 34.2    Diet: Alicia Knapp is currently in the action stage of change. As such, her goal is to continue with weight loss efforts and has agreed to following a lower carbohydrate, vegetable and lean protein rich diet plan.   Exercise:  Older adults should determine their level of effort for physical activity relative to their level of fitness.  She wants to stay consistent with her physical activity frequency over the next 6 weeks.  Behavior Modification:  We discussed the following Behavioral Modification Strategies today: increasing lean protein intake, decreasing simple carbohydrates, increasing vegetables, meal planning and cooking strategies, holiday eating strategies, and planning for success.   Return in about 7 weeks (around 11/21/2024) for fasting labs.   She was informed of the importance of frequent follow up visits to maximize her success with intensive lifestyle modifications for her multiple health conditions.  Attestation Statements:   Reviewed by clinician on day of  visit: allergies, medications, problem list, medical history, surgical history, family history, social history, and previous encounter notes.  Alicia Cho, MD

## 2024-10-04 ENCOUNTER — Telehealth: Payer: Self-pay | Admitting: Family Medicine

## 2024-10-04 NOTE — Assessment & Plan Note (Signed)
 Patient is tolerating combination therapy including Trulicity  and metformin .  Denies needing a refill of Trulicity  at this time.  She does need a refill of metformin  and will continue current dosage.

## 2024-10-04 NOTE — Telephone Encounter (Signed)
 Copied from CRM #[id number]. Topic: Medicare AWV >> Oct 04, 2024 10:53 AM Nathanel DEL wrote: Called LVM 10/04/2024 to sched AWV. Please schedule in office or virtual visit.   Nathanel Paschal; Care Guide Ambulatory Clinical Support [place] [facility] Direct Dial: [phone]

## 2024-10-04 NOTE — Assessment & Plan Note (Signed)
 Vitamin D  level from July still below goal.  Patient is on vitamin D  supplementation at this time.  Will need repeat labs in January or early February to reassess.

## 2024-10-05 ENCOUNTER — Other Ambulatory Visit (INDEPENDENT_AMBULATORY_CARE_PROVIDER_SITE_OTHER): Payer: Self-pay | Admitting: Family Medicine

## 2024-10-05 ENCOUNTER — Ambulatory Visit (INDEPENDENT_AMBULATORY_CARE_PROVIDER_SITE_OTHER): Admitting: Family Medicine

## 2024-10-05 DIAGNOSIS — R739 Hyperglycemia, unspecified: Secondary | ICD-10-CM

## 2024-10-28 ENCOUNTER — Other Ambulatory Visit: Payer: Self-pay

## 2024-10-28 MED ORDER — ANASTROZOLE 1 MG PO TABS: 1.0000 mg | ORAL_TABLET | Freq: Every day | ORAL | 3 refills | Status: AC

## 2024-10-28 NOTE — Telephone Encounter (Signed)
 Patient requested a refill of anastrozole  1 mg tablets to be sent to the [facility] on [address]. [facility] did not receive the refill request in time for the patient to obtain the medication. Refill was sent as requested, and the patient was informed.

## 2024-11-08 ENCOUNTER — Other Ambulatory Visit (INDEPENDENT_AMBULATORY_CARE_PROVIDER_SITE_OTHER): Payer: Self-pay | Admitting: Family Medicine

## 2024-11-08 DIAGNOSIS — E559 Vitamin D deficiency, unspecified: Secondary | ICD-10-CM

## 2024-11-11 ENCOUNTER — Telehealth: Payer: Self-pay | Admitting: Family Medicine

## 2024-11-11 NOTE — Telephone Encounter (Signed)
 Copied from CRM #[id number]. Topic: Medicare AWV >> Nov 11, 2024  9:52 AM Nathanel DEL wrote: Called LVM 11/11/2024 to sched AWVI. Please schedule AWVI in office.   Nathanel Paschal; Care Guide Ambulatory Clinical Support [place] [facility] Direct Dial: [phone]

## 2024-11-24 ENCOUNTER — Ambulatory Visit (INDEPENDENT_AMBULATORY_CARE_PROVIDER_SITE_OTHER): Admitting: Family Medicine

## 2024-11-24 ENCOUNTER — Encounter (INDEPENDENT_AMBULATORY_CARE_PROVIDER_SITE_OTHER): Payer: Self-pay | Admitting: Family Medicine

## 2024-11-24 VITALS — BP 148/82 | HR 76 | Temp 98.0°F | Ht 66.0 in | Wt 203.0 lb

## 2024-11-24 DIAGNOSIS — I10 Essential (primary) hypertension: Secondary | ICD-10-CM

## 2024-11-24 DIAGNOSIS — R7303 Prediabetes: Secondary | ICD-10-CM | POA: Diagnosis not present

## 2024-11-24 DIAGNOSIS — E785 Hyperlipidemia, unspecified: Secondary | ICD-10-CM | POA: Diagnosis not present

## 2024-11-24 DIAGNOSIS — E669 Obesity, unspecified: Secondary | ICD-10-CM

## 2024-11-24 DIAGNOSIS — Z6832 Body mass index (BMI) 32.0-32.9, adult: Secondary | ICD-10-CM | POA: Diagnosis not present

## 2024-11-24 DIAGNOSIS — E66811 Obesity, class 1: Secondary | ICD-10-CM

## 2024-11-24 DIAGNOSIS — E559 Vitamin D deficiency, unspecified: Secondary | ICD-10-CM

## 2024-11-24 MED ORDER — VITAMIN D (ERGOCALCIFEROL) 1.25 MG (50000 UNIT) PO CAPS: 50000.0000 [IU] | ORAL_CAPSULE | ORAL | 0 refills | Status: AC

## 2024-11-24 MED ORDER — TRULICITY 4.5 MG/0.5ML ~~LOC~~ SOAJ: 4.5000 mg | SUBCUTANEOUS | 0 refills | Status: AC

## 2024-11-24 NOTE — Progress Notes (Signed)
 "  SUBJECTIVE:  Chief Complaint: Obesity  Interim History: patient mentions she went and traveled to a family members house over the holidays and would rather have stayed home.  She is interested in getting her cholesterol checked.  She has been consistent with her exercise 4 times a week.  She has been being mindful of her food intake and felt she did really well with control over the holidays.  Her birthday is coming up next month and she is going to go to [place].    Alicia Knapp is here to discuss her progress with her obesity treatment plan. She is on the following a lower carbohydrate, vegetable and lean protein rich diet plan and states she is following her eating plan approximately 90 % of the time. She states she is exercising 20 minutes 4 times per week.   OBJECTIVE: Visit Diagnoses: Problem List Items Addressed This Visit       Cardiovascular and Mediastinum   Essential hypertension     Other   Hyperlipidemia   Relevant Orders   Lipid Panel With LDL/HDL Ratio   Vitamin D  deficiency   Relevant Medications   Vitamin D , Ergocalciferol , (DRISDOL ) 1.25 MG (50000 UNIT) CAPS capsule   Other Relevant Orders   VITAMIN D  25 Hydroxy (Vit-D Deficiency, Fractures)   Prediabetes - Primary   Relevant Medications   Dulaglutide  (TRULICITY ) 4.5 MG/0.5ML SOAJ   Other Relevant Orders   Comprehensive metabolic panel with GFR   Hemoglobin A1c   Insulin , random   Class 1 obesity with serious comorbidity and body mass index (BMI) of 34.0 to 34.9 in adult   Relevant Medications   Dulaglutide  (TRULICITY ) 4.5 MG/0.5ML SOAJ   Other Visit Diagnoses       BMI 32.0-32.9,adult           Vitals Temp: 98 F (36.7 C) BP: (!) 148/82 Pulse Rate: 76 SpO2: 98 %   Anthropometric Measurements Height: 5' 6 (1.676 m) Weight: 203 lb (92.1 kg) BMI (Calculated): 32.78 Weight at Last Visit: 202 lb Weight Lost Since Last Visit: 0 Weight Gained Since Last Visit: 1 Starting Weight: 212 lb Total  Weight Loss (lbs): 9 lb (4.082 kg)   Body Composition  Body Fat %: 44 % Fat Mass (lbs): 89.6 lbs Muscle Mass (lbs): 108.2 lbs Total Body Water (lbs): 72.2 lbs Visceral Fat Rating : 13   Other Clinical Data Fasting: yes Labs: yes Today's Visit #: 95 Starting Date: 08/03/18 Comments: Low Carb     ASSESSMENT AND PLAN: Assessment & Plan Prediabetes Patient is currently on Trulicity  for management of blood sugar.  Needs refill of current dose of Trulicity  of 4.5 mg weekly.  No change in dosage as patient is experiencing good effects on control of food choice intake.  Will repeat CMP, A1c, and insulin  level today.  Discuss lab results at next appointment. Vitamin D  deficiency Patient has been on vitamin D  supplementation for some time.  She still has occasional fatigue.  Will repeat vitamin D  level at this time.  Treatment plan pending results. Essential hypertension Blood pressure slightly elevated today.  No change in current treatment plan at this time.  No chest pain, chest pressure, or headache reported. BMI 32.0-32.9,adult  Hyperlipidemia, unspecified hyperlipidemia type Patient has been working on lifestyle modifications to assist in cholesterol control in addition to her pharmacotherapy.  Fasting lipid panel ordered today.  Will restratify at next appointment and discuss treatment plan modifications if necessary at that time. Obesity with starting BMI of 34.2  Diet: Jamilya is currently in the action stage of change. As such, her goal is to continue with weight loss efforts and has agreed to following a lower carbohydrate, vegetable and lean protein rich diet plan.   Exercise:  Older adults should determine their level of effort for physical activity relative to their level of fitness.  Behavior Modification:  We discussed the following Behavioral Modification Strategies today: increasing lean protein intake, decreasing simple carbohydrates, increasing vegetables, meal  planning and cooking strategies, keeping healthy foods in the home, and planning for success.   Return in about 5 weeks (around 12/29/2024).   She was informed of the importance of frequent follow up visits to maximize her success with intensive lifestyle modifications for her multiple health conditions.  Attestation Statements:   Reviewed by clinician on day of visit: allergies, medications, problem list, medical history, surgical history, family history, social history, and previous encounter notes.     Adelita Cho, MD "

## 2024-11-25 LAB — COMPREHENSIVE METABOLIC PANEL WITH GFR
ALT: 29 IU/L (ref 0–32)
AST: 20 IU/L (ref 0–40)
Albumin: 4.7 g/dL (ref 3.9–4.9)
Alkaline Phosphatase: 98 IU/L (ref 49–135)
BUN/Creatinine Ratio: 20 (ref 12–28)
BUN: 16 mg/dL (ref 8–27)
Bilirubin Total: 0.5 mg/dL (ref 0.0–1.2)
CO2: 24 mmol/L (ref 20–29)
Calcium: 10.3 mg/dL (ref 8.7–10.3)
Chloride: 99 mmol/L (ref 96–106)
Creatinine, Ser: 0.81 mg/dL (ref 0.57–1.00)
Globulin, Total: 2.9 g/dL (ref 1.5–4.5)
Glucose: 86 mg/dL (ref 70–99)
Potassium: 4.4 mmol/L (ref 3.5–5.2)
Sodium: 138 mmol/L (ref 134–144)
Total Protein: 7.6 g/dL (ref 6.0–8.5)
eGFR: 80 mL/min/1.73

## 2024-11-25 LAB — LIPID PANEL WITH LDL/HDL RATIO
Cholesterol, Total: 145 mg/dL (ref 100–199)
HDL: 47 mg/dL
LDL Chol Calc (NIH): 84 mg/dL (ref 0–99)
LDL/HDL Ratio: 1.8 ratio (ref 0.0–3.2)
Triglycerides: 69 mg/dL (ref 0–149)
VLDL Cholesterol Cal: 14 mg/dL (ref 5–40)

## 2024-11-25 LAB — INSULIN, RANDOM: INSULIN: 17.2 u[IU]/mL (ref 2.6–24.9)

## 2024-11-25 LAB — VITAMIN D 25 HYDROXY (VIT D DEFICIENCY, FRACTURES): Vit D, 25-Hydroxy: 86.4 ng/mL (ref 30.0–100.0)

## 2024-11-25 LAB — HEMOGLOBIN A1C
Est. average glucose Bld gHb Est-mCnc: 105 mg/dL
Hgb A1c MFr Bld: 5.3 % (ref 4.8–5.6)

## 2024-12-04 NOTE — Assessment & Plan Note (Signed)
 Patient has been working on lifestyle modifications to assist in cholesterol control in addition to her pharmacotherapy.  Fasting lipid panel ordered today.  Will restratify at next appointment and discuss treatment plan modifications if necessary at that time.

## 2024-12-04 NOTE — Assessment & Plan Note (Signed)
 Patient is currently on Trulicity  for management of blood sugar.  Needs refill of current dose of Trulicity  of 4.5 mg weekly.  No change in dosage as patient is experiencing good effects on control of food choice intake.  Will repeat CMP, A1c, and insulin  level today.  Discuss lab results at next appointment.

## 2024-12-04 NOTE — Assessment & Plan Note (Signed)
 Blood pressure slightly elevated today.  No change in current treatment plan at this time.  No chest pain, chest pressure, or headache reported.

## 2024-12-04 NOTE — Assessment & Plan Note (Signed)
 Patient has been on vitamin D  supplementation for some time.  She still has occasional fatigue.  Will repeat vitamin D  level at this time.  Treatment plan pending results.

## 2024-12-27 ENCOUNTER — Inpatient Hospital Stay: Admitting: Hematology and Oncology

## 2024-12-29 ENCOUNTER — Ambulatory Visit (INDEPENDENT_AMBULATORY_CARE_PROVIDER_SITE_OTHER): Admitting: Family Medicine

## 2025-01-06 ENCOUNTER — Ambulatory Visit

## 2025-01-25 ENCOUNTER — Encounter

## 2025-02-02 ENCOUNTER — Other Ambulatory Visit (HOSPITAL_BASED_OUTPATIENT_CLINIC_OR_DEPARTMENT_OTHER)

## 2025-04-17 ENCOUNTER — Ambulatory Visit: Admitting: Family Medicine

## 2025-09-26 ENCOUNTER — Inpatient Hospital Stay: Admitting: Adult Health
